# Patient Record
Sex: Female | Born: 1960 | Race: Black or African American | Hispanic: No | Marital: Single | Smoking: Former smoker
Health system: Southern US, Community
[De-identification: ages and names within clinical notes are randomized; demographics above are authoritative.]

## PROBLEM LIST (undated history)

## (undated) DIAGNOSIS — R7303 Prediabetes: Secondary | ICD-10-CM

## (undated) DIAGNOSIS — F419 Anxiety disorder, unspecified: Secondary | ICD-10-CM

## (undated) DIAGNOSIS — M549 Dorsalgia, unspecified: Secondary | ICD-10-CM

## (undated) DIAGNOSIS — I10 Essential (primary) hypertension: Secondary | ICD-10-CM

## (undated) DIAGNOSIS — E7800 Pure hypercholesterolemia, unspecified: Secondary | ICD-10-CM

## (undated) DIAGNOSIS — F32A Depression, unspecified: Secondary | ICD-10-CM

## (undated) DIAGNOSIS — K760 Fatty (change of) liver, not elsewhere classified: Secondary | ICD-10-CM

## (undated) DIAGNOSIS — K219 Gastro-esophageal reflux disease without esophagitis: Secondary | ICD-10-CM

## (undated) DIAGNOSIS — G43909 Migraine, unspecified, not intractable, without status migrainosus: Secondary | ICD-10-CM

## (undated) DIAGNOSIS — M1990 Unspecified osteoarthritis, unspecified site: Secondary | ICD-10-CM

## (undated) HISTORY — DX: Anxiety disorder, unspecified: F41.9

## (undated) HISTORY — DX: Unspecified osteoarthritis, unspecified site: M19.90

## (undated) HISTORY — PX: ABDOMINAL HYSTERECTOMY: SHX81

---

## 1998-09-15 ENCOUNTER — Emergency Department (HOSPITAL_COMMUNITY): Admission: EM | Admit: 1998-09-15 | Discharge: 1998-09-15 | Payer: Self-pay | Admitting: Emergency Medicine

## 1998-09-15 ENCOUNTER — Encounter: Payer: Self-pay | Admitting: Emergency Medicine

## 1999-08-26 ENCOUNTER — Emergency Department (HOSPITAL_COMMUNITY): Admission: EM | Admit: 1999-08-26 | Discharge: 1999-08-26 | Payer: Self-pay | Admitting: Emergency Medicine

## 1999-08-26 ENCOUNTER — Encounter: Payer: Self-pay | Admitting: Emergency Medicine

## 1999-12-15 ENCOUNTER — Ambulatory Visit (HOSPITAL_COMMUNITY): Admission: RE | Admit: 1999-12-15 | Discharge: 1999-12-15 | Payer: Self-pay | Admitting: *Deleted

## 2000-10-10 ENCOUNTER — Emergency Department (HOSPITAL_COMMUNITY): Admission: EM | Admit: 2000-10-10 | Discharge: 2000-10-10 | Payer: Self-pay | Admitting: Emergency Medicine

## 2001-11-15 ENCOUNTER — Encounter: Payer: Self-pay | Admitting: Otolaryngology

## 2001-11-15 ENCOUNTER — Encounter: Admission: RE | Admit: 2001-11-15 | Discharge: 2001-11-15 | Payer: Self-pay | Admitting: Otolaryngology

## 2001-11-25 ENCOUNTER — Encounter: Admission: RE | Admit: 2001-11-25 | Discharge: 2001-11-25 | Payer: Self-pay | Admitting: Otolaryngology

## 2001-11-25 ENCOUNTER — Encounter: Payer: Self-pay | Admitting: Otolaryngology

## 2002-10-18 ENCOUNTER — Emergency Department (HOSPITAL_COMMUNITY): Admission: EM | Admit: 2002-10-18 | Discharge: 2002-10-18 | Payer: Self-pay | Admitting: Emergency Medicine

## 2002-11-19 ENCOUNTER — Emergency Department (HOSPITAL_COMMUNITY): Admission: EM | Admit: 2002-11-19 | Discharge: 2002-11-19 | Payer: Self-pay | Admitting: Emergency Medicine

## 2003-01-26 ENCOUNTER — Other Ambulatory Visit: Admission: RE | Admit: 2003-01-26 | Discharge: 2003-01-26 | Payer: Self-pay | Admitting: Gynecology

## 2004-03-19 ENCOUNTER — Emergency Department (HOSPITAL_COMMUNITY): Admission: EM | Admit: 2004-03-19 | Discharge: 2004-03-19 | Payer: Self-pay | Admitting: Emergency Medicine

## 2004-12-07 ENCOUNTER — Encounter: Admission: RE | Admit: 2004-12-07 | Discharge: 2004-12-07 | Payer: Self-pay | Admitting: Nephrology

## 2005-05-01 ENCOUNTER — Other Ambulatory Visit: Admission: RE | Admit: 2005-05-01 | Discharge: 2005-05-01 | Payer: Self-pay | Admitting: Obstetrics and Gynecology

## 2005-05-04 ENCOUNTER — Ambulatory Visit (HOSPITAL_COMMUNITY): Admission: RE | Admit: 2005-05-04 | Discharge: 2005-05-04 | Payer: Self-pay | Admitting: Obstetrics and Gynecology

## 2005-05-15 ENCOUNTER — Encounter: Admission: RE | Admit: 2005-05-15 | Discharge: 2005-05-15 | Payer: Self-pay | Admitting: Nephrology

## 2006-01-04 ENCOUNTER — Inpatient Hospital Stay (HOSPITAL_COMMUNITY): Admission: EM | Admit: 2006-01-04 | Discharge: 2006-01-06 | Payer: Self-pay | Admitting: Emergency Medicine

## 2006-08-17 ENCOUNTER — Emergency Department (HOSPITAL_COMMUNITY): Admission: EM | Admit: 2006-08-17 | Discharge: 2006-08-18 | Payer: Self-pay | Admitting: Emergency Medicine

## 2007-01-23 ENCOUNTER — Ambulatory Visit (HOSPITAL_COMMUNITY): Admission: RE | Admit: 2007-01-23 | Discharge: 2007-01-23 | Payer: Self-pay | Admitting: Family Medicine

## 2007-01-23 ENCOUNTER — Emergency Department (HOSPITAL_COMMUNITY): Admission: EM | Admit: 2007-01-23 | Discharge: 2007-01-23 | Payer: Self-pay | Admitting: Family Medicine

## 2007-03-27 ENCOUNTER — Emergency Department (HOSPITAL_COMMUNITY): Admission: EM | Admit: 2007-03-27 | Discharge: 2007-03-27 | Payer: Self-pay | Admitting: Emergency Medicine

## 2008-04-17 ENCOUNTER — Emergency Department (HOSPITAL_COMMUNITY): Admission: EM | Admit: 2008-04-17 | Discharge: 2008-04-17 | Payer: Self-pay | Admitting: Emergency Medicine

## 2008-04-19 ENCOUNTER — Inpatient Hospital Stay (HOSPITAL_COMMUNITY): Admission: AD | Admit: 2008-04-19 | Discharge: 2008-04-19 | Payer: Self-pay | Admitting: Gynecology

## 2008-06-11 ENCOUNTER — Encounter (INDEPENDENT_AMBULATORY_CARE_PROVIDER_SITE_OTHER): Payer: Self-pay | Admitting: Obstetrics and Gynecology

## 2008-06-11 ENCOUNTER — Inpatient Hospital Stay (HOSPITAL_COMMUNITY): Admission: RE | Admit: 2008-06-11 | Discharge: 2008-06-13 | Payer: Self-pay | Admitting: Obstetrics and Gynecology

## 2008-06-19 ENCOUNTER — Inpatient Hospital Stay (HOSPITAL_COMMUNITY): Admission: AD | Admit: 2008-06-19 | Discharge: 2008-06-19 | Payer: Self-pay | Admitting: Family Medicine

## 2009-01-27 ENCOUNTER — Emergency Department (HOSPITAL_COMMUNITY): Admission: EM | Admit: 2009-01-27 | Discharge: 2009-01-27 | Payer: Self-pay | Admitting: Family Medicine

## 2009-08-07 ENCOUNTER — Emergency Department (HOSPITAL_COMMUNITY): Admission: EM | Admit: 2009-08-07 | Discharge: 2009-08-07 | Payer: Self-pay | Admitting: Emergency Medicine

## 2009-08-11 ENCOUNTER — Encounter: Admission: RE | Admit: 2009-08-11 | Discharge: 2009-08-11 | Payer: Self-pay | Admitting: Family Medicine

## 2009-11-12 ENCOUNTER — Encounter: Admission: RE | Admit: 2009-11-12 | Discharge: 2009-11-12 | Payer: Self-pay | Admitting: Cardiology

## 2009-11-15 ENCOUNTER — Ambulatory Visit (HOSPITAL_COMMUNITY): Admission: RE | Admit: 2009-11-15 | Discharge: 2009-11-15 | Payer: Self-pay | Admitting: Cardiology

## 2009-11-24 ENCOUNTER — Ambulatory Visit (HOSPITAL_COMMUNITY): Admission: RE | Admit: 2009-11-24 | Discharge: 2009-11-24 | Payer: Self-pay | Admitting: Cardiology

## 2010-09-11 ENCOUNTER — Encounter: Payer: Self-pay | Admitting: Obstetrics and Gynecology

## 2010-09-11 ENCOUNTER — Encounter: Payer: Self-pay | Admitting: Nephrology

## 2010-09-28 ENCOUNTER — Emergency Department (HOSPITAL_COMMUNITY)
Admission: EM | Admit: 2010-09-28 | Discharge: 2010-09-28 | Disposition: A | Discharge status: Home / Self Care | Payer: Self-pay | Attending: Emergency Medicine | Admitting: Emergency Medicine

## 2010-09-28 ENCOUNTER — Emergency Department (HOSPITAL_COMMUNITY): Payer: Self-pay

## 2010-09-28 DIAGNOSIS — S139XXA Sprain of joints and ligaments of unspecified parts of neck, initial encounter: Secondary | ICD-10-CM | POA: Insufficient documentation

## 2010-09-28 DIAGNOSIS — Y92410 Unspecified street and highway as the place of occurrence of the external cause: Secondary | ICD-10-CM | POA: Insufficient documentation

## 2010-09-28 DIAGNOSIS — E669 Obesity, unspecified: Secondary | ICD-10-CM | POA: Insufficient documentation

## 2010-09-28 DIAGNOSIS — R51 Headache: Secondary | ICD-10-CM | POA: Insufficient documentation

## 2010-10-27 ENCOUNTER — Emergency Department (HOSPITAL_COMMUNITY)
Admission: EM | Admit: 2010-10-27 | Discharge: 2010-10-27 | Disposition: A | Discharge status: Home / Self Care | Payer: Self-pay | Attending: Emergency Medicine | Admitting: Emergency Medicine

## 2010-10-27 DIAGNOSIS — E7800 Pure hypercholesterolemia, unspecified: Secondary | ICD-10-CM | POA: Insufficient documentation

## 2010-10-27 DIAGNOSIS — N644 Mastodynia: Secondary | ICD-10-CM | POA: Insufficient documentation

## 2010-10-27 DIAGNOSIS — K089 Disorder of teeth and supporting structures, unspecified: Secondary | ICD-10-CM | POA: Insufficient documentation

## 2010-10-27 DIAGNOSIS — S2000XA Contusion of breast, unspecified breast, initial encounter: Secondary | ICD-10-CM | POA: Insufficient documentation

## 2010-10-27 DIAGNOSIS — I10 Essential (primary) hypertension: Secondary | ICD-10-CM | POA: Insufficient documentation

## 2010-10-27 DIAGNOSIS — X58XXXA Exposure to other specified factors, initial encounter: Secondary | ICD-10-CM | POA: Insufficient documentation

## 2010-10-27 DIAGNOSIS — R6884 Jaw pain: Secondary | ICD-10-CM | POA: Insufficient documentation

## 2010-11-09 LAB — PROTIME-INR: INR: 1.07 (ref 0.00–1.49)

## 2010-11-09 LAB — APTT: aPTT: 30 seconds (ref 24–37)

## 2010-11-09 LAB — BASIC METABOLIC PANEL
BUN: 19 mg/dL (ref 6–23)
CO2: 30 mEq/L (ref 19–32)
Calcium: 8.9 mg/dL (ref 8.4–10.5)
Creatinine, Ser: 0.87 mg/dL (ref 0.4–1.2)
Glucose, Bld: 116 mg/dL — ABNORMAL HIGH (ref 70–99)
Sodium: 138 mEq/L (ref 135–145)

## 2010-11-09 LAB — CBC
HCT: 39.1 % (ref 36.0–46.0)
Hemoglobin: 13.1 g/dL (ref 12.0–15.0)
RBC: 4.56 MIL/uL (ref 3.87–5.11)
RDW: 14 % (ref 11.5–15.5)

## 2010-12-27 ENCOUNTER — Emergency Department (HOSPITAL_COMMUNITY): Payer: Self-pay

## 2010-12-27 ENCOUNTER — Inpatient Hospital Stay (HOSPITAL_COMMUNITY)
Admission: AD | Admit: 2010-12-27 | Discharge: 2010-12-27 | Disposition: A | Discharge status: Home / Self Care | Payer: Self-pay | Source: Ambulatory Visit | Attending: Obstetrics & Gynecology | Admitting: Obstetrics & Gynecology

## 2010-12-27 ENCOUNTER — Emergency Department (HOSPITAL_COMMUNITY)
Admission: EM | Admit: 2010-12-27 | Discharge: 2010-12-28 | Disposition: A | Discharge status: Home / Self Care | Payer: Self-pay | Attending: Emergency Medicine | Admitting: Emergency Medicine

## 2010-12-27 DIAGNOSIS — R109 Unspecified abdominal pain: Secondary | ICD-10-CM | POA: Insufficient documentation

## 2010-12-27 DIAGNOSIS — Z90710 Acquired absence of both cervix and uterus: Secondary | ICD-10-CM | POA: Insufficient documentation

## 2010-12-27 DIAGNOSIS — R110 Nausea: Secondary | ICD-10-CM | POA: Insufficient documentation

## 2010-12-27 DIAGNOSIS — R197 Diarrhea, unspecified: Secondary | ICD-10-CM | POA: Insufficient documentation

## 2010-12-27 DIAGNOSIS — R1031 Right lower quadrant pain: Secondary | ICD-10-CM | POA: Insufficient documentation

## 2010-12-27 LAB — COMPREHENSIVE METABOLIC PANEL
ALT: 22 U/L (ref 0–35)
ALT: 26 U/L (ref 0–35)
AST: 23 U/L (ref 0–37)
Albumin: 3.1 g/dL — ABNORMAL LOW (ref 3.5–5.2)
Alkaline Phosphatase: 91 U/L (ref 39–117)
BUN: 13 mg/dL (ref 6–23)
CO2: 28 mEq/L (ref 19–32)
Calcium: 8.6 mg/dL (ref 8.4–10.5)
Chloride: 102 mEq/L (ref 96–112)
GFR calc Af Amer: >60 mL/min (ref >60)
GFR calc non Af Amer: >60 mL/min (ref >60)
Glucose, Bld: 93 mg/dL (ref 70–99)
Glucose, Bld: 92 mg/dL (ref 70–99)
Potassium: 3.5 mEq/L (ref 3.5–5.1)
Sodium: 139 mEq/L (ref 135–145)
Sodium: 139 mEq/L (ref 135–145)
Total Bilirubin: 0.4 mg/dL (ref 0.3–1.2)
Total Protein: 6.5 g/dL (ref 6.0–8.3)

## 2010-12-27 LAB — DIFFERENTIAL
Basophils Absolute: 0 10*3/uL (ref 0.0–0.1)
Eosinophils Absolute: 0.3 10*3/uL (ref 0.0–0.7)
Eosinophils Relative: 4 % (ref 0–5)
Lymphocytes Relative: 38 % (ref 12–46)
Monocytes Absolute: 0.4 10*3/uL (ref 0.1–1.0)

## 2010-12-27 LAB — URINALYSIS, ROUTINE W REFLEX MICROSCOPIC
Nitrite: NEGATIVE
Specific Gravity, Urine: 1.015 (ref 1.005–1.030)
Urobilinogen, UA: 0.2 mg/dL (ref 0.0–1.0)
pH: 6.5 (ref 5.0–8.0)

## 2010-12-27 LAB — LIPASE, BLOOD: Lipase: 20 U/L (ref 11–59)

## 2010-12-27 LAB — CBC
HCT: 39.4 % (ref 36.0–46.0)
MCHC: 32.5 g/dL (ref 30.0–36.0)
Platelets: 229 10*3/uL (ref 150–400)
RDW: 13.9 % (ref 11.5–15.5)
WBC: 6.3 10*3/uL (ref 4.0–10.5)

## 2010-12-27 LAB — URINE MICROSCOPIC-ADD ON

## 2010-12-28 ENCOUNTER — Encounter (HOSPITAL_COMMUNITY): Payer: Self-pay

## 2010-12-28 MED ORDER — IOHEXOL 300 MG/ML  SOLN
100.0000 mL | Freq: Once | INTRAMUSCULAR | Status: AC | PRN
  Administered 2010-12-28: 100 mL via INTRAVENOUS

## 2011-01-02 ENCOUNTER — Other Ambulatory Visit (HOSPITAL_COMMUNITY): Payer: Self-pay | Admitting: Internal Medicine

## 2011-01-02 DIAGNOSIS — R1011 Right upper quadrant pain: Secondary | ICD-10-CM

## 2011-01-03 NOTE — H&P (Signed)
[name], Kelsey Santana NO.:  [id number]   MEDICAL RECORD NO.:  [id number]          PATIENT TYPE:  AMB   LOCATION:  SDC                           FACILITY:  [facility]   PHYSICIAN:  Lenoard Aden, M.D.DATE OF BIRTH:  Sep 12, 1960   DATE OF ADMISSION:  DATE OF DISCHARGE:                              HISTORY & PHYSICAL   CHIEF COMPLAINT:  Pelvic pain, bleeding, and symptomatic fibroids.   HISTORY OF PRESENT ILLNESS:  A 50 year old African American female G6,  P3, history of vaginal delivery x3, tubal ligation in 1987 for  symptomatic fibroids who desires minimally invasive surgery.   ALLERGIES:  She has allergies to CODEINE.   MEDICATIONS:  Wellbutrin and Percocet.   SOCIAL HISTORY:  She is a nonsmoker and nondrinker.  She denies domestic  or physical violence.   PAST MEDICAL HISTORY:  She has a history of 3 vaginal deliveries and 3  miscarriage, and tubal ligation in 25.   PHYSICAL EXAMINATION:  GENERAL:  She is a well-developed, well-nourished  African-American female in no acute distress.  HEENT:  Normal.  LUNGS:  Clear.  HEART:  Regular rate and rhythm.  ABDOMEN:  Soft and nontender.  PELVIC:  Uterus to be 10-12 weeks size and mobile.  No adnexal masses.  EXTREMITIES:  No cords.  NEUROLOGIC:  Nonfocal.  SKIN:  Intact.   IMPRESSION:  Symptomatic fibroids with a history of persistent pelvic  pain and desires definitive therapy.   PLAN:  Proceed with TLH, possible TAH-BSO.  Risks of anesthesia,  infection, bleeding, injury to abdominal organs and need for repair is  discussed, delayed versus immediate complications including bowel and  bladder injury noted. The patient acknowledges and wishes to proceed.      Lenoard Aden, M.D.  Electronically Signed     RJT/MEDQ  D:  06/10/2008  T:  06/11/2008  Job:  [id number]

## 2011-01-03 NOTE — Op Note (Signed)
[name], ROADS NO.:  [id number]   MEDICAL RECORD NO.:  [id number]          PATIENT TYPE:  INP   LOCATION:  9316                          FACILITY:  [facility]   PHYSICIAN:  Kelsey Santana, M.D.DATE OF BIRTH:  1961-03-10   DATE OF PROCEDURE:  06/11/2008  DATE OF DISCHARGE:                               OPERATIVE REPORT   PREOPERATIVE DIAGNOSIS:  Symptomatic uterine fibroids.   POSTOPERATIVE DIAGNOSIS:  Symptomatic uterine fibroids.   PROCEDURE:  Diagnostic laparoscopy, total abdominal hysterectomy,  bilateral salpingo-oophorectomy.   SURGEON:  Kelsey Aden, MD   ASSISTANT:  ___fogleman_______   ANESTHESIA:  General.   ESTIMATED BLOOD LOSS:  250 mL.   COMPLICATIONS:  None.   DRAINS:  Foley.   COUNTS:  Correct.   SPECIMEN:  Uterus, cervix, bilateral tubes and ovaries to pathology.   BRIEF OPERATIVE NOTE:  Being apprised of risks of anesthesia, infection,  bleeding, injury to abdominal organs, need for repair, delayed versus  immediate complications to include bowel and bladder injury, the patient  was brought to the operating where she was administered a general  anesthetic without complications, prepped and draped in usual sterile  fashion.  Feet are placed in the Yellofin stirrups.  The Rumi retractor  is placed without difficulty per vagina.  Infraumbilical incision made  with scalpel.  Fascia identified, opened, pursestring placed using 0  Vicryl suture.  Peritoneal entry made.  Hassan trocar placed.  Visualization reveals leakage of preperitoneal air and an inability to  visualize anterior and posterior cul-de-sac.  The patient is unable to  tolerate deep Trendelenburg due to her morbid obesity thereby due to  limited visualization and inability to position the patient properly.  Decision was made to proceed with an abdominal procedure.  The  infraumbilical incision was closed by tying the pursestring of 0 Vicryl  and a 4-0 Vicryl on the  skin.  Good hemostasis noted.  Attention is  turned to the abdominal portion of procedure whereby the patient was  repositioned on the table.  Feet were removed and placed on the table in  standard position out of the Yellofin stirrups.  All instruments removed  from the vagina.  At this time, a Pfannenstiel skin incision made with  the scalpel, carried down to the fascia which nicked in the midline and  opened with Mayo scissors.  Rectus muscles dissected sharply in the  midline.  Peritoneum entered sharply.  Bladder blade placed.  Visceral  peritoneum scored sharply off the lower uterine segment.  The round  ligaments were grasped bilaterally and ligated.  Retroperitoneal entry  was made.  The infundibulopelvic ligaments are bilaterally ligated using  LigaSure on the left and 0 Vicryl tie and interrupted suture on the  right LigaSure on the left.  After this, the uterine vessels were  skeletonized bilaterally and bladder flap was developed sharply and the  uterine vessels are ligated and divided using LigaSure and scissors.  The cardinal ligaments were then divided using LigaSure.  Having placed  a Balfour retractor and four packs, at this time the uterosacral  ligaments were bilaterally clamped and  suture ligated and held.  Vaginal  entry was made using curved Heaney clamps.  The specimen was removed.  Vagina closed front to back sutures of 0 Vicryl interrupted sutures and  good hemostasis noted.  Irrigation was accomplished.  Bladder flap was  inspected, found to be hemostatic.  All packs and retractor are then  removed.  Good hemostasis was achieved.  Fascia was closed using a 0 PDS  in a running fashion.  Subcutaneous tissue reapproximated using a 0  plain, skin closed using staples.  The patient tolerated the procedure  well and was transferred to recovery in good condition.      Kelsey Santana, M.D.  Electronically Signed     RJT/MEDQ  D:  06/11/2008  T:  06/12/2008   Job:  [id number]

## 2011-01-05 ENCOUNTER — Other Ambulatory Visit (HOSPITAL_COMMUNITY): Payer: Self-pay

## 2011-01-06 NOTE — Discharge Summary (Signed)
NAMEPRABHLEEN, MONTEMAYOR NO.:  [id number]   MEDICAL RECORD NO.:  [id number]          PATIENT TYPE:  INP   LOCATION:  9316                          [facility]ACILITY:  [facility]   PHYSICIAN:  Lenoard Aden, M.D.DATE OF BIRTH:  1960/09/04   DATE OF ADMISSION:  06/11/2008  DATE OF DISCHARGE:  06/13/2008                               DISCHARGE SUMMARY   The patient underwent uncomplicated diagnostic laparoscopy, total  abdominal hysterectomy June 11, 2008.   Postoperative course uncomplicated.   Discharged to home on postop day #3.  Discharge teaching done.  Discharge medications discussed.   DISCHARGE MEDICATIONS:  1. Tylox.  2. Multivitamin.  3. Hormone replacement therapy.   She is to follow up in the office for incisional care.  Incisional care  is reviewed.  She is going to follow up in the office in 1 week.   DISCHARGE STATUS:  Good.      Lenoard Aden, M.D.  Electronically Signed     RJT/MEDQ  D:  07/14/2008  T:  07/14/2008  Job:  [id number]

## 2011-05-17 ENCOUNTER — Emergency Department (HOSPITAL_COMMUNITY)
Admission: EM | Admit: 2011-05-17 | Discharge: 2011-05-17 | Discharge status: Against Medical Advice | Payer: Self-pay | Attending: Emergency Medicine | Admitting: Emergency Medicine

## 2011-05-17 ENCOUNTER — Inpatient Hospital Stay (INDEPENDENT_AMBULATORY_CARE_PROVIDER_SITE_OTHER)
Admission: RE | Admit: 2011-05-17 | Discharge: 2011-05-17 | Disposition: A | Discharge status: Home / Self Care | Payer: No Typology Code available for payment source | Source: Ambulatory Visit | Attending: Emergency Medicine | Admitting: Emergency Medicine

## 2011-05-17 ENCOUNTER — Emergency Department (HOSPITAL_COMMUNITY): Admission: EM | Admit: 2011-05-17 | Payer: Self-pay | Source: Home / Self Care

## 2011-05-17 DIAGNOSIS — Z0389 Encounter for observation for other suspected diseases and conditions ruled out: Secondary | ICD-10-CM | POA: Insufficient documentation

## 2011-05-17 DIAGNOSIS — R109 Unspecified abdominal pain: Secondary | ICD-10-CM

## 2011-05-17 LAB — URINALYSIS, ROUTINE W REFLEX MICROSCOPIC
Bilirubin Urine: NEGATIVE
Ketones, ur: NEGATIVE mg/dL
Nitrite: NEGATIVE
Protein, ur: NEGATIVE mg/dL
Urobilinogen, UA: 0.2 mg/dL (ref 0.0–1.0)

## 2011-05-17 LAB — URINE MICROSCOPIC-ADD ON

## 2011-05-18 ENCOUNTER — Other Ambulatory Visit: Payer: Self-pay | Admitting: Internal Medicine

## 2011-05-18 ENCOUNTER — Ambulatory Visit
Admission: RE | Admit: 2011-05-18 | Discharge: 2011-05-18 | Disposition: A | Discharge status: Home / Self Care | Payer: No Typology Code available for payment source | Source: Ambulatory Visit | Attending: Internal Medicine | Admitting: Internal Medicine

## 2011-05-18 DIAGNOSIS — R1011 Right upper quadrant pain: Secondary | ICD-10-CM

## 2011-05-23 LAB — CBC
HCT: 33.8 — ABNORMAL LOW
HCT: 39.5
Hemoglobin: 11.5 — ABNORMAL LOW
Hemoglobin: 13.3
MCHC: 33.4
MCHC: 33.9
MCV: 87.3
Platelets: 271
RBC: 3.75 — ABNORMAL LOW
RBC: 3.87
RBC: 4.56
RDW: 13.1
WBC: 5.8

## 2011-06-08 LAB — POCT URINALYSIS DIP (DEVICE)
Bilirubin Urine: NEGATIVE
Glucose, UA: NEGATIVE
Ketones, ur: NEGATIVE
Nitrite: NEGATIVE
Operator id: 247071
Protein, ur: NEGATIVE
Specific Gravity, Urine: 1.015
Urobilinogen, UA: 0.2
pH: 6

## 2011-06-08 LAB — DIFFERENTIAL
Basophils Relative: 1
Eosinophils Absolute: 0.3
Lymphs Abs: 2
Neutrophils Relative %: 67

## 2011-06-08 LAB — WET PREP, GENITAL
Clue Cells Wet Prep HPF POC: NONE SEEN
Yeast Wet Prep HPF POC: NONE SEEN

## 2011-06-08 LAB — CBC
MCHC: 33.3
MCV: 86.9
Platelets: 313
WBC: 8.3

## 2011-06-08 LAB — POCT PREGNANCY, URINE
Operator id: 247071
Preg Test, Ur: NEGATIVE

## 2011-07-06 ENCOUNTER — Encounter (HOSPITAL_COMMUNITY): Payer: Self-pay | Admitting: *Deleted

## 2011-07-06 ENCOUNTER — Other Ambulatory Visit: Payer: Self-pay

## 2011-07-06 ENCOUNTER — Emergency Department (HOSPITAL_COMMUNITY): Payer: No Typology Code available for payment source

## 2011-07-06 ENCOUNTER — Emergency Department (HOSPITAL_COMMUNITY)
Admission: EM | Admit: 2011-07-06 | Discharge: 2011-07-06 | Disposition: A | Discharge status: Home / Self Care | Payer: No Typology Code available for payment source | Attending: Emergency Medicine | Admitting: Emergency Medicine

## 2011-07-06 DIAGNOSIS — S20219A Contusion of unspecified front wall of thorax, initial encounter: Secondary | ICD-10-CM | POA: Insufficient documentation

## 2011-07-06 DIAGNOSIS — M542 Cervicalgia: Secondary | ICD-10-CM | POA: Insufficient documentation

## 2011-07-06 DIAGNOSIS — S161XXA Strain of muscle, fascia and tendon at neck level, initial encounter: Secondary | ICD-10-CM

## 2011-07-06 DIAGNOSIS — S0990XA Unspecified injury of head, initial encounter: Secondary | ICD-10-CM | POA: Insufficient documentation

## 2011-07-06 DIAGNOSIS — M545 Low back pain, unspecified: Secondary | ICD-10-CM | POA: Insufficient documentation

## 2011-07-06 DIAGNOSIS — R10819 Abdominal tenderness, unspecified site: Secondary | ICD-10-CM | POA: Insufficient documentation

## 2011-07-06 DIAGNOSIS — S139XXA Sprain of joints and ligaments of unspecified parts of neck, initial encounter: Secondary | ICD-10-CM | POA: Insufficient documentation

## 2011-07-06 DIAGNOSIS — R079 Chest pain, unspecified: Secondary | ICD-10-CM | POA: Insufficient documentation

## 2011-07-06 DIAGNOSIS — R51 Headache: Secondary | ICD-10-CM | POA: Insufficient documentation

## 2011-07-06 DIAGNOSIS — R0602 Shortness of breath: Secondary | ICD-10-CM | POA: Insufficient documentation

## 2011-07-06 DIAGNOSIS — R109 Unspecified abdominal pain: Secondary | ICD-10-CM | POA: Insufficient documentation

## 2011-07-06 HISTORY — DX: Pure hypercholesterolemia, unspecified: E78.00

## 2011-07-06 HISTORY — DX: Essential (primary) hypertension: I10

## 2011-07-06 LAB — POCT I-STAT, CHEM 8
BUN: 14 mg/dL (ref 6–23)
Calcium, Ion: 1.13 mmol/L (ref 1.12–1.32)
Chloride: 103 mEq/L (ref 96–112)
Glucose, Bld: 102 mg/dL — ABNORMAL HIGH (ref 70–99)
TCO2: 27 mmol/L (ref 0–100)

## 2011-07-06 MED ORDER — DIAZEPAM 5 MG PO TABS
5.0000 mg | ORAL_TABLET | Freq: Once | ORAL | Status: AC
  Administered 2011-07-06: 5 mg via ORAL
  Filled 2011-07-06: qty 1

## 2011-07-06 MED ORDER — OXYCODONE-ACETAMINOPHEN 5-325 MG PO TABS: 2.0000 | ORAL_TABLET | ORAL | Status: AC | PRN

## 2011-07-06 MED ORDER — MORPHINE SULFATE 4 MG/ML IJ SOLN
6.0000 mg | Freq: Once | INTRAMUSCULAR | Status: AC
  Administered 2011-07-06: 6 mg via INTRAVENOUS
  Filled 2011-07-06: qty 2

## 2011-07-06 MED ORDER — IBUPROFEN 800 MG PO TABS: 800.0000 mg | ORAL_TABLET | Freq: Three times a day (TID) | ORAL | Status: AC

## 2011-07-06 MED ORDER — IOHEXOL 300 MG/ML  SOLN
100.0000 mL | Freq: Once | INTRAMUSCULAR | Status: AC | PRN
  Administered 2011-07-06: 100 mL via INTRAVENOUS

## 2011-07-06 MED ORDER — ONDANSETRON HCL 4 MG/2ML IJ SOLN
4.0000 mg | Freq: Once | INTRAMUSCULAR | Status: AC
  Administered 2011-07-06: 4 mg via INTRAVENOUS
  Filled 2011-07-06: qty 2

## 2011-07-06 MED ORDER — DIAZEPAM 5 MG PO TABS: 5.0000 mg | ORAL_TABLET | Freq: Three times a day (TID) | ORAL | Status: AC | PRN

## 2011-07-06 NOTE — ED Provider Notes (Signed)
Medical screening examination/treatment/procedure(s) were performed by non-physician practitioner and as supervising physician I was immediately available for consultation/collaboration.   Jedd Schulenburg R. Itzamara Casas, MD 07/06/11 2341 

## 2011-07-06 NOTE — ED Notes (Signed)
Prior to arrival [facility] reported vitals as BP 178/120, RR 18, Pulse 90, Pt arrived in C-collar on back board

## 2011-07-06 NOTE — ED Notes (Signed)
Pt resting quietly in bed. States pain in chest/abdonmen is improved. Reports headache at this time. Awaiting ct results.
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
## 2012-03-01 ENCOUNTER — Telehealth: Payer: Self-pay | Admitting: *Deleted

## 2012-03-01 NOTE — Telephone Encounter (Signed)
Pt returning call and would like to be scheduled in first available and it does not matter if the provider is female is female .Marland KitchenLoralee Pacas [facility]

## 2012-03-11 ENCOUNTER — Ambulatory Visit (INDEPENDENT_AMBULATORY_CARE_PROVIDER_SITE_OTHER): Payer: Self-pay | Admitting: Family Medicine

## 2012-03-11 ENCOUNTER — Encounter: Payer: Self-pay | Admitting: Family Medicine

## 2012-03-11 VITALS — BP 123/79 | HR 57 | Temp 98.1°F | Ht 63.5 in | Wt 280.2 lb

## 2012-03-11 DIAGNOSIS — G43909 Migraine, unspecified, not intractable, without status migrainosus: Secondary | ICD-10-CM

## 2012-03-11 DIAGNOSIS — K219 Gastro-esophageal reflux disease without esophagitis: Secondary | ICD-10-CM

## 2012-03-11 DIAGNOSIS — E785 Hyperlipidemia, unspecified: Secondary | ICD-10-CM

## 2012-03-11 DIAGNOSIS — I10 Essential (primary) hypertension: Secondary | ICD-10-CM

## 2012-03-11 MED ORDER — PROPRANOLOL HCL 20 MG PO TABS: 20.0000 mg | ORAL_TABLET | Freq: Two times a day (BID) | ORAL | Status: DC

## 2012-03-11 NOTE — Progress Notes (Signed)
  Subjective:    Patient ID: Kelsey Santana, female    DOB: 25-Jun-1961, 51 y.o.   MRN: [id number]  HPI Patient is a 51 yo female who presents for a new patient visit with her main complaints regarding her migraine headaches and epigastric pain.    1. Migraine headaches: patient has a long history of these.  States when she gets a migraine, she has nausea, vomiting, extreme light intolerance, and feels as though her equilibrium is off.  Has previously been treated with depakote, topamax, imetrex, trigger point injections, prednisone, wellbutrin, and demoral.  Out of these, she feels as though only the trigger point injections, wellbutrin, and demerol have been effective.  With the injections, she said they were very painful and didn't work for long enough to cover the 3 month gap between injections.  She states the wellbutrin was effective because it helped decrease her stress level and she believes stress is a trigger for her migraines.  The demerol is effective at breaking the migraines once they have started.  Currently she is having a couple of migraines per week and is just suffering through them as she does not have insurance anymore and is unable to afford the medication at this time.  2. Epigastric pain: states she has a burning pain in her epigastric region.  Occasionally this moves up her chest. patient has a history of GERD and has previously taken a number of PPIs for this.  She states the only medication that has worked was protonix, but she cannot afford this at this time.  3. HTN: Currently well controlled. 123/79 in clinic today. Patient states she takes benazepril-HCTZ.    Review of Systems negative except per HPI     Objective:   Physical Exam  Constitutional: She is oriented to person, place, and time. She appears well-developed and well-nourished.  HENT:  Head: Normocephalic and atraumatic.  Eyes: Pupils are equal, round, and reactive to light.  Cardiovascular: Normal rate,  regular rhythm and normal heart sounds.   Pulmonary/Chest: Effort normal and breath sounds normal.  Abdominal: Soft. Bowel sounds are normal. She exhibits no mass. There is tenderness (epigastric region with palpation). There is no rebound and no guarding.  Neurological: She is alert and oriented to person, place, and time. She has normal strength and normal reflexes. No cranial nerve deficit.  Psychiatric: She has a normal mood and affect.          Assessment & Plan:

## 2012-03-11 NOTE — Assessment & Plan Note (Signed)
Patient is to start over the counter omeprazole for her epigastric and reflux symptoms. Once approved for the [facility] card will give prescription for omeprazole for patient to get from the health department.

## 2012-03-11 NOTE — Assessment & Plan Note (Signed)
Patient has tried numerous medications for this with no benefit.  Only medications to have been effective were welbutrin and demerol, which are now too expensive for the patient now that she has no insurance.  Will start the patient on Propranolol 20 mg BID for migraine prophylaxis.  Patient advised to try tylenol or aspirin 650 mg for breaking the migraines once the pain started.  She understood that she would not be prescribed any form of narcotics at this visit.

## 2012-03-11 NOTE — Assessment & Plan Note (Addendum)
Currently treated with atorvastatin. Patient will return in one month for a full physical exam and will obtain labs at that appointment to determine lipid levels.

## 2012-03-11 NOTE — Assessment & Plan Note (Signed)
Blood pressure well controlled currently on Benazeprill-HCTZ.  Will continue to follow this.

## 2012-03-11 NOTE — Patient Instructions (Addendum)
Migraine Headache A migraine headache is an intense, throbbing pain on one or both sides of your head. The exact cause of a migraine headache is not always known. A migraine may be caused when nerves in the brain become irritated and release chemicals that cause swelling within blood vessels, causing pain. Many migraine sufferers have a family history of migraines. Before you get a migraine you may or may not get an aura. An aura is a group of symptoms that can predict the beginning of a migraine. An aura may include:  Visual changes such as:   Flashing lights.   Bright spots or zig-zag lines.   Tunnel vision.   Feelings of numbness.   Trouble talking.   Muscle weakness.  SYMPTOMS  Pain on one or both sides of your head.   Pain that is pulsating or throbbing in nature.   Pain that is severe enough to prevent daily activities.   Pain that is aggravated by any daily physical activity.   Nausea (feeling sick to your stomach), vomiting, or both.   Pain with exposure to bright lights, loud noises, or activity.   General sensitivity to bright lights or loud noises.  MIGRAINE TRIGGERS Examples of triggers of migraine headaches include:   Alcohol.   Smoking.   Stress.   It may be related to menses (female menstruation).   Aged cheeses.   Foods or drinks that contain nitrates, glutamate, aspartame, or tyramine.   Lack of sleep.   Chocolate.   Caffeine.   Hunger.   Medications such as nitroglycerine (used to treat chest pain), birth control pills, estrogen, and some blood pressure medications.  DIAGNOSIS  A migraine headache is often diagnosed based on:  Symptoms.   Physical examination.   A computerized X-ray scan (computed tomography, CT) of your head.  TREATMENT  Medications can help prevent migraines if they are recurrent or should they become recurrent. Your caregiver can help you with a medication or treatment program that will be helpful to you.   Lying  down in a dark, quiet room may be helpful.   Keeping a headache diary may help you find a trend as to what may be triggering your headaches.  SEEK IMMEDIATE MEDICAL CARE IF:   You have confusion, personality changes or seizures.   You have headaches that wake you from sleep.   You have an increased frequency in your headaches.   You have a stiff neck.   You have a loss of vision.   You have muscle weakness.   You start losing your balance or have trouble walking.   You feel faint or pass out.  MAKE SURE YOU:   Understand these instructions.   Will watch your condition.   Will get help right away if you are not doing well or get worse.  Document Released: 08/07/2005 Document Revised: 07/27/2011 Document Reviewed: 03/23/2009 [facility] Patient Information 2012 [place], [place].  Gastroesophageal Reflux Disease, Adult Gastroesophageal reflux disease (GERD) happens when acid from your stomach flows up into the esophagus. When acid comes in contact with the esophagus, the acid causes soreness (inflammation) in the esophagus. Over time, GERD may create small holes (ulcers) in the lining of the esophagus. CAUSES   Increased body weight. This puts pressure on the stomach, making acid rise from the stomach into the esophagus.   Smoking. This increases acid production in the stomach.   Drinking alcohol. This causes decreased pressure in the lower esophageal sphincter (valve or ring of muscle between  the esophagus and stomach), allowing acid from the stomach into the esophagus.   Late evening meals and a full stomach. This increases pressure and acid production in the stomach.   A malformed lower esophageal sphincter.  Sometimes, no cause is found. SYMPTOMS   Burning pain in the lower part of the mid-chest behind the breastbone and in the mid-stomach area. This may occur twice a week or more often.   Trouble swallowing.   Sore throat.   Dry cough.   Asthma-like symptoms  including chest tightness, shortness of breath, or wheezing.  DIAGNOSIS  Your caregiver may be able to diagnose GERD based on your symptoms. In some cases, X-rays and other tests may be done to check for complications or to check the condition of your stomach and esophagus. TREATMENT  Your caregiver may recommend over-the-counter or prescription medicines to help decrease acid production. Ask your caregiver before starting or adding any new medicines.  HOME CARE INSTRUCTIONS   Change the factors that you can control. Ask your caregiver for guidance concerning weight loss, quitting smoking, and alcohol consumption.   Avoid foods and drinks that make your symptoms worse, such as:   Caffeine or alcoholic drinks.   Chocolate.   Peppermint or mint flavorings.   Garlic and onions.   Spicy foods.   Citrus fruits, such as oranges, lemons, or limes.   Tomato-based foods such as sauce, chili, salsa, and pizza.   Fried and fatty foods.   Avoid lying down for the 3 hours prior to your bedtime or prior to taking a nap.   Eat small, frequent meals instead of large meals.   Wear loose-fitting clothing. Do not wear anything tight around your waist that causes pressure on your stomach.   Raise the head of your bed 6 to 8 inches with wood blocks to help you sleep. Extra pillows will not help.   Only take over-the-counter or prescription medicines for pain, discomfort, or fever as directed by your caregiver.   Do not take aspirin, ibuprofen, or other nonsteroidal anti-inflammatory drugs (NSAIDs).  SEEK IMMEDIATE MEDICAL CARE IF:   You have pain in your arms, neck, jaw, teeth, or back.   Your pain increases or changes in intensity or duration.   You develop nausea, vomiting, or sweating (diaphoresis).   You develop shortness of breath, or you faint.   Your vomit is green, yellow, black, or looks like coffee grounds or blood.   Your stool is red, bloody, or black.  These symptoms could  be signs of other problems, such as heart disease, gastric bleeding, or esophageal bleeding. MAKE SURE YOU:   Understand these instructions.   Will watch your condition.   Will get help right away if you are not doing well or get worse.  Document Released: 05/17/2005 Document Revised: 07/27/2011 Document Reviewed: 02/24/2011 [facility] Patient Information 2012 [place], [place].     Nice to meet you today. We discussed your migraine headaches and stomach pain. For your migraines, I have prescribed Propranolol 20 mg two times per day.  Please also try Aspirin 650 mg or tylenol for your headaches. For your epigastric pain, please try over the counter omeprazole for the next month.  If this does not help we will reevaluate when you return for your physical exam. Please return for a physical exam in one month. Thank you for your visit today.

## 2012-03-19 ENCOUNTER — Encounter: Payer: Self-pay | Admitting: Family Medicine

## 2012-03-19 ENCOUNTER — Ambulatory Visit (INDEPENDENT_AMBULATORY_CARE_PROVIDER_SITE_OTHER): Payer: Self-pay | Admitting: Family Medicine

## 2012-03-19 VITALS — BP 158/80 | HR 68 | Temp 98.2°F | Ht 64.0 in | Wt 277.0 lb

## 2012-03-19 DIAGNOSIS — G43909 Migraine, unspecified, not intractable, without status migrainosus: Secondary | ICD-10-CM

## 2012-03-19 DIAGNOSIS — I10 Essential (primary) hypertension: Secondary | ICD-10-CM

## 2012-03-19 DIAGNOSIS — R1013 Epigastric pain: Secondary | ICD-10-CM | POA: Insufficient documentation

## 2012-03-19 LAB — CBC
HCT: 38.9 % (ref 36.0–46.0)
Hemoglobin: 13.2 g/dL (ref 12.0–15.0)
MCHC: 33.9 g/dL (ref 30.0–36.0)
RBC: 4.63 MIL/uL (ref 3.87–5.11)
WBC: 4.8 10*3/uL (ref 4.0–10.5)

## 2012-03-19 LAB — COMPREHENSIVE METABOLIC PANEL
Albumin: 4 g/dL (ref 3.5–5.2)
BUN: 10 mg/dL (ref 6–23)
CO2: 28 mEq/L (ref 19–32)
Calcium: 8.7 mg/dL (ref 8.4–10.5)
Chloride: 107 mEq/L (ref 96–112)
Creat: 0.78 mg/dL (ref 0.50–1.10)
Glucose, Bld: 109 mg/dL — ABNORMAL HIGH (ref 70–99)
Potassium: 4.2 mEq/L (ref 3.5–5.3)

## 2012-03-19 LAB — POCT H PYLORI SCREEN: H Pylori Screen, POC: NEGATIVE

## 2012-03-19 MED ORDER — PROPRANOLOL HCL 40 MG PO TABS: 40.0000 mg | ORAL_TABLET | Freq: Two times a day (BID) | ORAL | Status: DC

## 2012-03-19 MED ORDER — KETOROLAC TROMETHAMINE 30 MG/ML IJ SOLN
30.0000 mg | Freq: Once | INTRAMUSCULAR | Status: AC
  Administered 2012-03-19: 30 mg via INTRAMUSCULAR

## 2012-03-19 MED ORDER — PROMETHAZINE HCL 25 MG/ML IJ SOLN
25.0000 mg | Freq: Once | INTRAMUSCULAR | Status: AC
  Administered 2012-03-19: 25 mg via INTRAMUSCULAR

## 2012-03-19 MED ORDER — DEXAMETHASONE SODIUM PHOSPHATE 10 MG/ML IJ SOLN
10.0000 mg | Freq: Once | INTRAMUSCULAR | Status: AC
  Administered 2012-03-19: 10 mg via INTRAMUSCULAR

## 2012-03-19 NOTE — Assessment & Plan Note (Signed)
POorly controlled.  Will be increasing propranolol for migraines.  Follow-up with PCP in 2-3 weeks

## 2012-03-19 NOTE — Progress Notes (Signed)
Subjective:     Patient ID: Kelsey Santana, female   DOB: 1961-03-14, 51 y.o.   MRN: [id number] Discussed and examined patient with [facility]- I have edited as below: Kelsey Santana, M.D.   HPI Ms. Faulks is a 51 y/o woman with a history of hypertension, hyperlipidemia, GERD, and migraines who comes to clinic today with headache and abdominal pain.   She says the headache started about two weeks ago. She says she has had migraines in the past and this headache feels very similar. The headaches usually start on the left side, but then her entire head begins to hurt. She feels that bright lights and loud noises make the headaches worse. She reports that occasional floaters. She has tried taking tylenol and excedrin to help, but has had little relief. She is taking the propranolol prescribed to her at last visit but feels this is not helping either.   She reports the abdominal pain has been going on longer than the headaches, for about a months time. At her last visit on 7/22 it was felt that her symptoms were related to her GERD, and she started taking her Nexium again. She feels that this has not helped her symptoms. She states the pain is worst in the epigastric region, but also feels it on the upper left and right side. The pain feels like a "sharp, stabbing pain" and is made worse by eating and drinking. She also has experienced the feeling of "phlegm" coming up her throat often.   She denies other symptoms, including fever, vomiting, diarrhea, blood in her urine or stool, shortness of breath, chest pain, or numbness or tingling in her hands or feet.   Surgical Hx: Reports she has had a complete hysterectomy and bilateral oopherectomy  Review of Systems As per above.   Objective:   Physical Exam General: Well appearing, no acute distress HEENT: PERRLA, neck is supple and without adenopathy Pulm: LCTAB CV: RRR, no murmurs, rubs, or gallops Ab: + BS, soft, there is tenderness to palpation in the  epigastric region, as well as in the left and right upper quadrants, no rebound, no guarding, no organomegaly,  Ext: No clubbing or cyanosis, no edema  Assessment/Plan:  1. Headaches: Consistent with patients past history of migraines. Will give toradol/phenergan injection here in the office for pain control and will increase propranolol to 40 mg twice a day for prophylaxis. Advised to follow up with her PCP in 2 weeks if still having symptoms.  2. Abdominal pain: Epigastric tenderness that is made worse with eating and a history of GERD are suspicious for PUD. Because not improved with Nexium will check H. Pylori antibody test in the office to evaluate for need of triple therapy. Also, given history of hyperlipidemia, will amylase and lipase to rule out pancreatitis.

## 2012-03-19 NOTE — Patient Instructions (Addendum)
Increase propranolol to 40 mg twice a day- new prescription sent  Will check for H pylori bacteria as well as inflammation of gallbladder and pancreas in your labwork  Follow-up with primary doctor in 2-3 weeks

## 2012-03-19 NOTE — Assessment & Plan Note (Addendum)
Mild pain.  Will check h pylori, CBC, cmet, lipase.    Update:  All above labs do not indicate cause for pain

## 2012-03-19 NOTE — Assessment & Plan Note (Addendum)
Given Toradol 30, phenergan, dexamethasone in office today.  Will increase propranolol, asked to follow-up with PCP in 2-3 weeks for further care of headache.  IF pulse will not tolerate to optimal dose of propranolol for prevention, may benefit from alternative prophylactic therapy

## 2012-03-19 NOTE — Progress Notes (Signed)
  Subjective:    Patient ID: Kelsey Santana, female    DOB: 16-May-1961, 51 y.o.   MRN: [id number] HPI Work in for headaches and abdominal pain.  I have reviewed patient's  PMH, FH, and Social history and Medications as related to this visit.  Headache: 2 weeks.  starts on left side, light and sound sensitivity.  Typical for her migraines headaches.  Has been a patient at a headache center.  States imitrex has not helped.  Previously had been helped with wellbutrin.  States trigger point injections has helped as well as demerol in the past.    Abdominal pain:  1 month, at last OV, thought was related to GERD.  Described as epigastric tenderness, stabbing pain. Radiating bilaterally.  Questions if related to new weight watchers diet.  Has started taking nexium with no improvement.  Usually after eating or drinking.  Currently not in pain.   I have reviewed patient's  PMH, FH, and Social history and Medications as related to this visit. Sig for history of GERD and Migraines  Review of Systems See HPI    Objective:   Physical Exam GEN: Alert & Oriented, No acute distress CV:  Regular Rate & Rhythm, no murmur Respiratory:  Normal work of breathing, CTAB Abd:  + BS, soft, mild TTP epigastric area and right upper and left upper quadrant Ext: no pre-tibial edema          Assessment & Plan:

## 2012-03-20 ENCOUNTER — Encounter: Payer: Self-pay | Admitting: Family Medicine

## 2012-03-29 ENCOUNTER — Other Ambulatory Visit: Payer: Self-pay | Admitting: Family Medicine

## 2012-03-29 MED ORDER — BENAZEPRIL HCL 20 MG PO TABS: 20.0000 mg | ORAL_TABLET | Freq: Every day | ORAL | Status: DC

## 2012-03-29 NOTE — Telephone Encounter (Signed)
Patient informed of message from MD, expressed understanding. 

## 2012-03-29 NOTE — Telephone Encounter (Signed)
[facility]  I put in to fax this to them.  She also requests different medication for migraine prophylaxis.  Patient has an appointment on Tuesday to follow this up.  Will see her then and determine the best course of action regarding migraine prophylaxis.

## 2012-03-29 NOTE — Telephone Encounter (Signed)
Patient is calling needing a refill for Lot[facility] and the Inderal makes her chest hurt bad and she would like to try something else.

## 2012-04-02 ENCOUNTER — Encounter: Payer: Self-pay | Admitting: Family Medicine

## 2012-04-09 ENCOUNTER — Other Ambulatory Visit: Payer: Self-pay | Admitting: Family Medicine

## 2012-04-09 DIAGNOSIS — I10 Essential (primary) hypertension: Secondary | ICD-10-CM

## 2012-04-09 NOTE — Telephone Encounter (Signed)
Patient is calling because [facility]'t [facility] Lotensin so she will need a different medication sent in instead.

## 2012-04-09 NOTE — Telephone Encounter (Signed)
Called the [facility] department.  They only have lisinopril (any strength) available.  Are you will to change?

## 2012-04-10 MED ORDER — LISINOPRIL 20 MG PO TABS: 20.0000 mg | ORAL_TABLET | Freq: Every day | ORAL | Status: DC

## 2012-04-10 NOTE — Telephone Encounter (Signed)
Called in Rx verbally.  Pt has appt with Shawnie Pons on the 23 of this month.  Milas Gain, Maryjo Rochester

## 2012-04-10 NOTE — Telephone Encounter (Signed)
Wrote prescription for lisinopril 20 mg to be [facility]ed to [facility].  Patient missed last appointment and really needs to come in for a follow-up appointment before anymore medications are refilled. Please let me know if there is anything else I need to do to get this prescription filled.

## 2012-04-15 ENCOUNTER — Encounter: Payer: Self-pay | Admitting: Family Medicine

## 2012-04-15 ENCOUNTER — Ambulatory Visit (INDEPENDENT_AMBULATORY_CARE_PROVIDER_SITE_OTHER): Payer: Self-pay | Admitting: Family Medicine

## 2012-04-15 VITALS — BP 130/85 | HR 60 | Ht 64.0 in | Wt 277.0 lb

## 2012-04-15 DIAGNOSIS — E669 Obesity, unspecified: Secondary | ICD-10-CM

## 2012-04-15 DIAGNOSIS — E785 Hyperlipidemia, unspecified: Secondary | ICD-10-CM

## 2012-04-15 DIAGNOSIS — B354 Tinea corporis: Secondary | ICD-10-CM

## 2012-04-15 DIAGNOSIS — Z1231 Encounter for screening mammogram for malignant neoplasm of breast: Secondary | ICD-10-CM

## 2012-04-15 DIAGNOSIS — I10 Essential (primary) hypertension: Secondary | ICD-10-CM

## 2012-04-15 DIAGNOSIS — G43909 Migraine, unspecified, not intractable, without status migrainosus: Secondary | ICD-10-CM

## 2012-04-15 DIAGNOSIS — R5383 Other fatigue: Secondary | ICD-10-CM | POA: Insufficient documentation

## 2012-04-15 DIAGNOSIS — F329 Major depressive disorder, single episode, unspecified: Secondary | ICD-10-CM

## 2012-04-15 DIAGNOSIS — Z0000 Encounter for general adult medical examination without abnormal findings: Secondary | ICD-10-CM

## 2012-04-15 MED ORDER — BUPROPION HCL ER (XL) 150 MG PO TB24: 150.0000 mg | ORAL_TABLET | Freq: Every day | ORAL | Status: DC

## 2012-04-15 MED ORDER — ATORVASTATIN CALCIUM 10 MG PO TABS: 10.0000 mg | ORAL_TABLET | Freq: Every day | ORAL | Status: DC

## 2012-04-15 MED ORDER — NYSTATIN 100000 UNIT/GM EX CREA: TOPICAL_CREAM | Freq: Two times a day (BID) | CUTANEOUS | Status: AC

## 2012-04-15 MED ORDER — BENAZEPRIL HCL 20 MG PO TABS: 20.0000 mg | ORAL_TABLET | Freq: Every day | ORAL | Status: DC

## 2012-04-15 MED ORDER — BUTALBITAL-APAP-CAFFEINE 50-325-40 MG PO TABS: 1.0000 | ORAL_TABLET | Freq: Four times a day (QID) | ORAL | Status: AC | PRN

## 2012-04-15 NOTE — Assessment & Plan Note (Signed)
Continue weight loss and exercise.

## 2012-04-15 NOTE — Assessment & Plan Note (Signed)
Refill medication

## 2012-04-15 NOTE — Assessment & Plan Note (Addendum)
Refilled lipitor. The patient will return for a fasting lipid panel.

## 2012-04-15 NOTE — Assessment & Plan Note (Signed)
Trial of Nystatin. 

## 2012-04-15 NOTE — Progress Notes (Signed)
  Subjective:    Patient ID: Kelsey Santana, female    DOB: Sep 28, 1960, 51 y.o.   MRN: [id number]  HPI Here today for CPD. The patient reports frequent migraine headaches. Was recently placed on propranolol this office however that medication today for chest pain. Additionally the patient reports intermittent epigastric pain since her hysterectomy. She has not ever had her gallbladder removed. The patient has not had a lipid check for some time. It has been approximately 10 years of her last colonoscopy. The patient had a mammogram approximately 1 year ago. The patient needs refills on her blood pressure medication as well as Wellbutrin which was prescribed previously for her migraine headaches and depression.   Review of Systems  Constitutional: Negative for fever and chills.  HENT: Negative for nosebleeds, congestion and rhinorrhea.   Eyes: Negative for visual disturbance.  Respiratory: Negative for chest tightness and shortness of breath.   Cardiovascular: Negative for chest pain.  Gastrointestinal: Negative for nausea, vomiting, abdominal pain, diarrhea, constipation, blood in stool and abdominal distention.  Genitourinary: Negative for dysuria, frequency and vaginal bleeding.  Musculoskeletal: Negative for arthralgias.  Skin: Negative for rash.  Neurological: Negative for dizziness and headaches.  Psychiatric/Behavioral: Negative for behavioral problems, disturbed wake/sleep cycle and dysphoric mood.  All other systems reviewed and are negative.       Objective:   Physical Exam  Vitals reviewed. Constitutional: She is oriented to person, place, and time. She appears well-developed and well-nourished.  HENT:  Head: Normocephalic and atraumatic.  Right Ear: Tympanic membrane normal.  Left Ear: Tympanic membrane normal.  Mouth/Throat: Oropharynx is clear and moist.  Eyes: Pupils are equal, round, and reactive to light. No scleral icterus.  Neck: Normal range of motion. Neck  supple. Carotid bruit is not present. No thyromegaly present.  Cardiovascular: Normal rate and regular rhythm.   No murmur heard. Pulmonary/Chest: Effort normal and breath sounds normal. She has no wheezes. Right breast exhibits no inverted nipple, no mass, no nipple discharge and no skin change. Left breast exhibits no inverted nipple, no mass, no nipple discharge and no skin change.  Abdominal: Soft. She exhibits no mass. There is no tenderness. There is no guarding.  Musculoskeletal: Normal range of motion.  Lymphadenopathy:    She has no cervical adenopathy.  Neurological: She is alert and oriented to person, place, and time.  Skin: Skin is warm and dry. No rash noted.  Psychiatric: She has a normal mood and affect. Her behavior is normal. Thought content normal.          Assessment & Plan:  Mammogram Patient does not need Pap smears since her hysterectomy was done for benign reasons and her cervix was removed. Schedule colonoscopy

## 2012-04-15 NOTE — Assessment & Plan Note (Signed)
Check TSH 

## 2012-04-15 NOTE — Assessment & Plan Note (Signed)
Trial of Fiorcet

## 2012-04-15 NOTE — Patient Instructions (Signed)
Preventive Care for Adults, Female A healthy lifestyle and preventive care can promote health and wellness. Preventive health guidelines for women include the following key practices.  A routine yearly physical is a good way to check with your caregiver about your health and preventive screening. It is a chance to share any concerns and updates on your health, and to receive a thorough exam.   Visit your dentist for a routine exam and preventive care every 6 months. Brush your teeth twice a day and floss once a day. Good oral hygiene prevents tooth decay and gum disease.   The frequency of eye exams is based on your age, health, family medical history, use of contact lenses, and other factors. Follow your caregiver's recommendations for frequency of eye exams.   Eat a healthy diet. Foods like vegetables, fruits, whole grains, low-fat dairy products, and lean protein foods contain the nutrients you need without too many calories. Decrease your intake of foods high in solid fats, added sugars, and salt. Eat the right amount of calories for you.Get information about a proper diet from your caregiver, if necessary.   Regular physical exercise is one of the most important things you can do for your health. Most adults should get at least 150 minutes of moderate-intensity exercise (any activity that increases your heart rate and causes you to sweat) each week. In addition, most adults need muscle-strengthening exercises on 2 or more days a week.   Maintain a healthy weight. The body mass index (BMI) is a screening tool to identify possible weight problems. It provides an estimate of body fat based on height and weight. Your caregiver can help determine your BMI, and can help you achieve or maintain a healthy weight.For adults 20 years and older:   A BMI below 18.5 is considered underweight.   A BMI of 18.5 to 24.9 is normal.   A BMI of 25 to 29.9 is considered overweight.   A BMI of 30 and above is  considered obese.   Maintain normal blood lipids and cholesterol levels by exercising and minimizing your intake of saturated fat. Eat a balanced diet with plenty of fruit and vegetables. Blood tests for lipids and cholesterol should begin at age 20 and be repeated every 5 years. If your lipid or cholesterol levels are high, you are over 50, or you are at high risk for heart disease, you may need your cholesterol levels checked more frequently.Ongoing high lipid and cholesterol levels should be treated with medicines if diet and exercise are not effective.   If you smoke, find out from your caregiver how to quit. If you do not use tobacco, do not start.   If you are pregnant, do not drink alcohol. If you are breastfeeding, be very cautious about drinking alcohol. If you are not pregnant and choose to drink alcohol, do not exceed 1 drink per day. One drink is considered to be 12 ounces (355 mL) of beer, 5 ounces (148 mL) of wine, or 1.5 ounces (44 mL) of liquor.   Avoid use of street drugs. Do not share needles with anyone. Ask for help if you need support or instructions about stopping the use of drugs.   High blood pressure causes heart disease and increases the risk of stroke. Your blood pressure should be checked at least every 1 to 2 years. Ongoing high blood pressure should be treated with medicines if weight loss and exercise are not effective.   If you are 55 to 52   years old, ask your caregiver if you should take aspirin to prevent strokes.   Diabetes screening involves taking a blood sample to check your fasting blood sugar level. This should be done once every 3 years, after age 45, if you are within normal weight and without risk factors for diabetes. Testing should be considered at a younger age or be carried out more frequently if you are overweight and have at least 1 risk factor for diabetes.   Breast cancer screening is essential preventive care for women. You should practice "breast  self-awareness." This means understanding the normal appearance and feel of your breasts and may include breast self-examination. Any changes detected, no matter how small, should be reported to a caregiver. Women in their 20s and 30s should have a clinical breast exam (CBE) by a caregiver as part of a regular health exam every 1 to 3 years. After age 40, women should have a CBE every year. Starting at age 40, women should consider having a mammography (breast X-ray test) every year. Women who have a family history of breast cancer should talk to their caregiver about genetic screening. Women at a high risk of breast cancer should talk to their caregivers about having magnetic resonance imaging (MRI) and a mammography every year.   The Pap test is a screening test for cervical cancer. A Pap test can show cell changes on the cervix that might become cervical cancer if left untreated. A Pap test is a procedure in which cells are obtained and examined from the lower end of the uterus (cervix).   Women should have a Pap test starting at age 21.   Between ages 21 and 29, Pap tests should be repeated every 2 years.   Beginning at age 30, you should have a Pap test every 3 years as long as the past 3 Pap tests have been normal.   Some women have medical problems that increase the chance of getting cervical cancer. Talk to your caregiver about these problems. It is especially important to talk to your caregiver if a new problem develops soon after your last Pap test. In these cases, your caregiver may recommend more frequent screening and Pap tests.   The above recommendations are the same for women who have or have not gotten the vaccine for human papillomavirus (HPV).   If you had a hysterectomy for a problem that was not cancer or a condition that could lead to cancer, then you no longer need Pap tests. Even if you no longer need a Pap test, a regular exam is a good idea to make sure no other problems are  starting.   If you are between ages 65 and 70, and you have had normal Pap tests going back 10 years, you no longer need Pap tests. Even if you no longer need a Pap test, a regular exam is a good idea to make sure no other problems are starting.   If you have had past treatment for cervical cancer or a condition that could lead to cancer, you need Pap tests and screening for cancer for at least 20 years after your treatment.   If Pap tests have been discontinued, risk factors (such as a new sexual partner) need to be reassessed to determine if screening should be resumed.   The HPV test is an additional test that may be used for cervical cancer screening. The HPV test looks for the virus that can cause the cell changes on the cervix.   The cells collected during the Pap test can be tested for HPV. The HPV test could be used to screen women aged 30 years and older, and should be used in women of any age who have unclear Pap test results. After the age of 30, women should have HPV testing at the same frequency as a Pap test.   Colorectal cancer can be detected and often prevented. Most routine colorectal cancer screening begins at the age of 50 and continues through age 75. However, your caregiver may recommend screening at an earlier age if you have risk factors for colon cancer. On a yearly basis, your caregiver may provide home test kits to check for hidden blood in the stool. Use of a small camera at the end of a tube, to directly examine the colon (sigmoidoscopy or colonoscopy), can detect the earliest forms of colorectal cancer. Talk to your caregiver about this at age 50, when routine screening begins. Direct examination of the colon should be repeated every 5 to 10 years through age 75, unless early forms of pre-cancerous polyps or small growths are found.   Hepatitis C blood testing is recommended for all people born from 1945 through 1965 and any individual with known risks for hepatitis C.    Practice safe sex. Use condoms and avoid high-risk sexual practices to reduce the spread of sexually transmitted infections (STIs). STIs include gonorrhea, chlamydia, syphilis, trichomonas, herpes, HPV, and human immunodeficiency virus (HIV). Herpes, HIV, and HPV are viral illnesses that have no cure. They can result in disability, cancer, and death. Sexually active women aged 25 and younger should be checked for chlamydia. Older women with new or multiple partners should also be tested for chlamydia. Testing for other STIs is recommended if you are sexually active and at increased risk.   Osteoporosis is a disease in which the bones lose minerals and strength with aging. This can result in serious bone fractures. The risk of osteoporosis can be identified using a bone density scan. Women ages 65 and over and women at risk for fractures or osteoporosis should discuss screening with their caregivers. Ask your caregiver whether you should take a calcium supplement or vitamin D to reduce the rate of osteoporosis.   Menopause can be associated with physical symptoms and risks. Hormone replacement therapy is available to decrease symptoms and risks. You should talk to your caregiver about whether hormone replacement therapy is right for you.   Use sunscreen with sun protection factor (SPF) of 30 or more. Apply sunscreen liberally and repeatedly throughout the day. You should seek shade when your shadow is shorter than you. Protect yourself by wearing long sleeves, pants, a wide-brimmed hat, and sunglasses year round, whenever you are outdoors.   Once a month, do a whole body skin exam, using a mirror to look at the skin on your back. Notify your caregiver of new moles, moles that have irregular borders, moles that are larger than a pencil eraser, or moles that have changed in shape or color.   Stay current with required immunizations.   Influenza. You need a dose every fall (or winter). The composition of  the flu vaccine changes each year, so being vaccinated once is not enough.   Pneumococcal polysaccharide. You need 1 to 2 doses if you smoke cigarettes or if you have certain chronic medical conditions. You need 1 dose at age 65 (or older) if you have never been vaccinated.   Tetanus, diphtheria, pertussis (Tdap, Td). Get 1 dose of   Tdap vaccine if you are younger than age 65, are over 65 and have contact with an infant, are a healthcare worker, are pregnant, or simply want to be protected from whooping cough. After that, you need a Td booster dose every 10 years. Consult your caregiver if you have not had at least 3 tetanus and diphtheria-containing shots sometime in your life or have a deep or dirty wound.   HPV. You need this vaccine if you are a woman age 26 or younger. The vaccine is given in 3 doses over 6 months.   Measles, mumps, rubella (MMR). You need at least 1 dose of MMR if you were born in 1957 or later. You may also need a second dose.   Meningococcal. If you are age 19 to 21 and a first-year college student living in a residence hall, or have one of several medical conditions, you need to get vaccinated against meningococcal disease. You may also need additional booster doses.   Zoster (shingles). If you are age 60 or older, you should get this vaccine.   Varicella (chickenpox). If you have never had chickenpox or you were vaccinated but received only 1 dose, talk to your caregiver to find out if you need this vaccine.   Hepatitis A. You need this vaccine if you have a specific risk factor for hepatitis A virus infection or you simply wish to be protected from this disease. The vaccine is usually given as 2 doses, 6 to 18 months apart.   Hepatitis B. You need this vaccine if you have a specific risk factor for hepatitis B virus infection or you simply wish to be protected from this disease. The vaccine is given in 3 doses, usually over 6 months.  Preventive Services /  Frequency Ages 19 to 39  Blood pressure check.** / Every 1 to 2 years.   Lipid and cholesterol check.** / Every 5 years beginning at age 20.   Clinical breast exam.** / Every 3 years for women in their 20s and 30s.   Pap test.** / Every 2 years from ages 21 through 29. Every 3 years starting at age 30 through age 65 or 70 with a history of 3 consecutive normal Pap tests.   HPV screening.** / Every 3 years from ages 30 through ages 65 to 70 with a history of 3 consecutive normal Pap tests.   Hepatitis C blood test.** / For any individual with known risks for hepatitis C.   Skin self-exam. / Monthly.   Influenza immunization.** / Every year.   Pneumococcal polysaccharide immunization.** / 1 to 2 doses if you smoke cigarettes or if you have certain chronic medical conditions.   Tetanus, diphtheria, pertussis (Tdap, Td) immunization. / A one-time dose of Tdap vaccine. After that, you need a Td booster dose every 10 years.   HPV immunization. / 3 doses over 6 months, if you are 26 and younger.   Measles, mumps, rubella (MMR) immunization. / You need at least 1 dose of MMR if you were born in 1957 or later. You may also need a second dose.   Meningococcal immunization. / 1 dose if you are age 19 to 21 and a first-year college student living in a residence hall, or have one of several medical conditions, you need to get vaccinated against meningococcal disease. You may also need additional booster doses.   Varicella immunization.** / Consult your caregiver.   Hepatitis A immunization.** / Consult your caregiver. 2 doses, 6 to 18 months   apart.   Hepatitis B immunization.** / Consult your caregiver. 3 doses usually over 6 months.  Ages 40 to 64  Blood pressure check.** / Every 1 to 2 years.   Lipid and cholesterol check.** / Every 5 years beginning at age 20.   Clinical breast exam.** / Every year after age 40.   Mammogram.** / Every year beginning at age 40 and continuing for as  long as you are in good health. Consult with your caregiver.   Pap test.** / Every 3 years starting at age 30 through age 65 or 70 with a history of 3 consecutive normal Pap tests.   HPV screening.** / Every 3 years from ages 30 through ages 65 to 70 with a history of 3 consecutive normal Pap tests.   Fecal occult blood test (FOBT) of stool. / Every year beginning at age 50 and continuing until age 75. You may not need to do this test if you get a colonoscopy every 10 years.   Flexible sigmoidoscopy or colonoscopy.** / Every 5 years for a flexible sigmoidoscopy or every 10 years for a colonoscopy beginning at age 50 and continuing until age 75.   Hepatitis C blood test.** / For all people born from 1945 through 1965 and any individual with known risks for hepatitis C.   Skin self-exam. / Monthly.   Influenza immunization.** / Every year.   Pneumococcal polysaccharide immunization.** / 1 to 2 doses if you smoke cigarettes or if you have certain chronic medical conditions.   Tetanus, diphtheria, pertussis (Tdap, Td) immunization.** / A one-time dose of Tdap vaccine. After that, you need a Td booster dose every 10 years.   Measles, mumps, rubella (MMR) immunization. / You need at least 1 dose of MMR if you were born in 1957 or later. You may also need a second dose.   Varicella immunization.** / Consult your caregiver.   Meningococcal immunization.** / Consult your caregiver.   Hepatitis A immunization.** / Consult your caregiver. 2 doses, 6 to 18 months apart.   Hepatitis B immunization.** / Consult your caregiver. 3 doses, usually over 6 months.  Ages 65 and over  Blood pressure check.** / Every 1 to 2 years.   Lipid and cholesterol check.** / Every 5 years beginning at age 20.   Clinical breast exam.** / Every year after age 40.   Mammogram.** / Every year beginning at age 40 and continuing for as long as you are in good health. Consult with your caregiver.   Pap test.** /  Every 3 years starting at age 30 through age 65 or 70 with a 3 consecutive normal Pap tests. Testing can be stopped between 65 and 70 with 3 consecutive normal Pap tests and no abnormal Pap or HPV tests in the past 10 years.   HPV screening.** / Every 3 years from ages 30 through ages 65 or 70 with a history of 3 consecutive normal Pap tests. Testing can be stopped between 65 and 70 with 3 consecutive normal Pap tests and no abnormal Pap or HPV tests in the past 10 years.   Fecal occult blood test (FOBT) of stool. / Every year beginning at age 50 and continuing until age 75. You may not need to do this test if you get a colonoscopy every 10 years.   Flexible sigmoidoscopy or colonoscopy.** / Every 5 years for a flexible sigmoidoscopy or every 10 years for a colonoscopy beginning at age 50 and continuing until age 75.   Hepatitis   C blood test.** / For all people born from [birth year] through 1965 and any individual with known risks for hepatitis C.   Osteoporosis screening.** / A one-time screening for women ages 70 and over and women at risk for fractures or osteoporosis.   Skin self-exam. / Monthly.   Influenza immunization.** / Every year.   Pneumococcal polysaccharide immunization.** / 1 dose at age 66 (or older) if you have never been vaccinated.   Tetanus, diphtheria, pertussis (Tdap, Td) immunization. / A one-time dose of Tdap vaccine if you are over 65 and have contact with an infant, are a Research scientist (physical sciences), or simply want to be protected from whooping cough. After that, you need a Td booster dose every 10 years.   Varicella immunization.** / Consult your caregiver.   Meningococcal immunization.** / Consult your caregiver.   Hepatitis A immunization.** / Consult your caregiver. 2 doses, 6 to 18 months apart.   Hepatitis B immunization.** / Check with your caregiver. 3 doses, usually over 6 months.  ** Family history and personal history of risk and conditions may change your caregiver's  recommendations. Document Released: 10/03/2001 Document Revised: 07/27/2011 Document Reviewed: 01/02/2011 [facility] Patient Information 2012 [place], [place].Fatigue Fatigue is a feeling of tiredness, lack of energy, lack of motivation, or feeling tired all the time. Having enough rest, good nutrition, and reducing stress will normally reduce fatigue. Consult your caregiver if it persists. The nature of your fatigue will help your caregiver to find out its cause. The treatment is based on the cause.  CAUSES  There are many causes for fatigue. Most of the time, fatigue can be traced to one or more of your habits or routines. Most causes fit into one or more of three general areas. They are: Lifestyle problems  Sleep disturbances.   Overwork.   Physical exertion.   Unhealthy habits.   Poor eating habits or eating disorders.   Alcohol and/or drug use .   Lack of proper nutrition (malnutrition).  Psychological problems  Stress and/or anxiety problems.   Depression.   Grief.   Boredom.  Medical Problems or Conditions  Anemia.   Pregnancy.   Thyroid gland problems.   Recovery from major surgery.   Continuous pain.   Emphysema or asthma that is not well controlled   Allergic conditions.   Diabetes.   Infections (such as mononucleosis).   Obesity.   Sleep disorders, such as sleep apnea.   Heart failure or other heart-related problems.   Cancer.   Kidney disease.   Liver disease.   Effects of certain medicines such as antihistamines, cough and cold remedies, prescription pain medicines, heart and blood pressure medicines, drugs used for treatment of cancer, and some antidepressants.  SYMPTOMS  The symptoms of fatigue include:   Lack of energy.   Lack of drive (motivation).   Drowsiness.   Feeling of indifference to the surroundings.  DIAGNOSIS  The details of how you feel help guide your caregiver in finding out what is causing the fatigue. You will be  asked about your present and past health condition. It is important to review all medicines that you take, including prescription and non-prescription items. A thorough exam will be done. You will be questioned about your feelings, habits, and normal lifestyle. Your caregiver may suggest blood tests, urine tests, or other tests to look for common medical causes of fatigue.  TREATMENT  Fatigue is treated by correcting the underlying cause. For example, if you have continuous pain or depression, treating these  causes will improve how you feel. Similarly, adjusting the dose of certain medicines will help in reducing fatigue.  HOME CARE INSTRUCTIONS   Try to get the required amount of good sleep every night.   Eat a healthy and nutritious diet, and drink enough water throughout the day.   Practice ways of relaxing (including yoga or meditation).   Exercise regularly.   Make plans to change situations that cause stress. Act on those plans so that stresses decrease over time. Keep your work and personal routine reasonable.   Avoid street drugs and minimize use of alcohol.   Start taking a daily multivitamin after consulting your caregiver.  SEEK MEDICAL CARE IF:   You have persistent tiredness, which cannot be accounted for.   You have fever.   You have unintentional weight loss.   You have headaches.   You have disturbed sleep throughout the night.   You are feeling sad.   You have constipation.   You have dry skin.   You have gained weight.   You are taking any new or different medicines that you suspect are causing fatigue.   You are unable to sleep at night.   You develop any unusual swelling of your legs or other parts of your body.  SEEK IMMEDIATE MEDICAL CARE IF:   You are feeling confused.   Your vision is blurred.   You feel faint or pass out.   You develop severe headache.   You develop severe abdominal, pelvic, or back pain.   You develop chest pain,  shortness of breath, or an irregular or fast heartbeat.   You are unable to pass a normal amount of urine.   You develop abnormal bleeding such as bleeding from the rectum or you vomit blood.   You have thoughts about harming yourself or committing suicide.   You are worried that you might harm someone else.  MAKE SURE YOU:   Understand these instructions.   Will watch your condition.   Will get help right away if you are not doing well or get worse.  Document Released: 06/04/2007 Document Revised: 07/27/2011 Document Reviewed: 06/04/2007 [facility] Patient Information 2012 [place], [place].Hypertriglyceridemia  Diet for High blood levels of Triglycerides Most fats in food are triglycerides. Triglycerides in your blood are stored as fat in your body. High levels of triglycerides in your blood may put you at a greater risk for heart disease and stroke.  Normal triglyceride levels are less than 150 mg/dL. Borderline high levels are 150-199 mg/dl. High levels are 200 - 499 mg/dL, and very high triglyceride levels are greater than 500 mg/dL. The decision to treat high triglycerides is generally based on the level. For people with borderline or high triglyceride levels, treatment includes weight loss and exercise. Drugs are recommended for people with very high triglyceride levels. Many people who need treatment for high triglyceride levels have metabolic syndrome. This syndrome is a collection of disorders that often include: insulin resistance, high blood pressure, blood clotting problems, high cholesterol and triglycerides. TESTING PROCEDURE FOR TRIGLYCERIDES  You should not eat 4 hours before getting your triglycerides measured. The normal range of triglycerides is between 10 and 250 milligrams per deciliter (mg/dl). Some people may have extreme levels (1000 or above), but your triglyceride level may be too high if it is above 150 mg/dl, depending on what other risk factors you have for heart  disease.   People with high blood triglycerides may also have high blood cholesterol levels. If you  have high blood cholesterol as well as high blood triglycerides, your risk for heart disease is probably greater than if you only had high triglycerides. High blood cholesterol is one of the main risk factors for heart disease.  CHANGING YOUR DIET  Your weight can affect your blood triglyceride level. If you are more than 20% above your ideal body weight, you may be able to lower your blood triglycerides by losing weight. Eating less and exercising regularly is the best way to combat this. Fat provides more calories than any other food. The best way to lose weight is to eat less fat. Only 30% of your total calories should come from fat. Less than 7% of your diet should come from saturated fat. A diet low in fat and saturated fat is the same as a diet to decrease blood cholesterol. By eating a diet lower in fat, you may lose weight, lower your blood cholesterol, and lower your blood triglyceride level.  Eating a diet low in fat, especially saturated fat, may also help you lower your blood triglyceride level. Ask your dietitian to help you figure how much fat you can eat based on the number of calories your caregiver has prescribed for you.  Exercise, in addition to helping with weight loss may also help lower triglyceride levels.   Alcohol can increase blood triglycerides. You may need to stop drinking alcoholic beverages.   Too much carbohydrate in your diet may also increase your blood triglycerides. Some complex carbohydrates are necessary in your diet. These may include bread, rice, potatoes, other starchy vegetables and cereals.   Reduce "simple" carbohydrates. These may include pure sugars, candy, honey, and jelly without losing other nutrients. If you have the kind of high blood triglycerides that is affected by the amount of carbohydrates in your diet, you will need to eat less sugar and less  high-sugar foods. Your caregiver can help you with this.   Adding 2-4 grams of fish oil (EPA+ DHA) may also help lower triglycerides. Speak with your caregiver before adding any supplements to your regimen.  Following the Diet  Maintain your ideal weight. Your caregivers can help you with a diet. Generally, eating less food and getting more exercise will help you lose weight. Joining a weight control group may also help. Ask your caregivers for a good weight control group in your area.  Eat low-fat foods instead of high-fat foods. This can help you lose weight too.  These foods are lower in fat. Eat MORE of these:   Dried beans, peas, and lentils.   Egg whites.   Low-fat cottage cheese.   Fish.   Lean cuts of meat, such as round, sirloin, rump, and flank (cut extra fat off meat you fix).   Whole grain breads, cereals and pasta.   Skim and nonfat dry milk.   Low-fat yogurt.   Poultry without the skin.   Cheese made with skim or part-skim milk, such as mozzarella, parmesan, farmers', ricotta, or pot cheese.  These are higher fat foods. Eat LESS of these:   Whole milk and foods made from whole milk, such as American, blue, cheddar, monterey jack, and swiss cheese   High-fat meats, such as luncheon meats, sausages, knockwurst, bratwurst, hot dogs, ribs, corned beef, ground pork, and regular ground beef.   Fried foods.  Limit saturated fats in your diet. Substituting unsaturated fat for saturated fat may decrease your blood triglyceride level. You will need to read package labels to know which products contain  saturated fats.  These foods are high in saturated fat. Eat LESS of these:   Fried pork skins.   Whole milk.   Skin and fat from poultry.   Palm oil.   Butter.   Shortening.   Cream cheese.   Tomasa Blase.   Margarines and baked goods made from listed oils.   Vegetable shortenings.   Chitterlings.   Fat from meats.   Coconut oil.   Palm kernel oil.   Lard.    Cream.   Sour cream.   Fatback.   Coffee whiteners and non-dairy creamers made with these oils.   Cheese made from whole milk.  Use unsaturated fats (both polyunsaturated and monounsaturated) moderately. Remember, even though unsaturated fats are better than saturated fats; you still want a diet low in total fat.  These foods are high in unsaturated fat:   Canola oil.   Sunflower oil.   Mayonnaise.   Almonds.   Peanuts.   Pine nuts.   Margarines made with these oils.   Safflower oil.   Olive oil.   Avocados.   Cashews.   Peanut butter.   Sunflower seeds.   Soybean oil.   Peanut oil.   Olives.   Pecans.   Walnuts.   Pumpkin seeds.  Avoid sugar and other high-sugar foods. This will decrease carbohydrates without decreasing other nutrients. Sugar in your food goes rapidly to your blood. When there is excess sugar in your blood, your liver may use it to make more triglycerides. Sugar also contains calories without other important nutrients.  Eat LESS of these:   Sugar, brown sugar, powdered sugar, jam, jelly, preserves, honey, syrup, molasses, pies, candy, cakes, cookies, frosting, pastries, colas, soft drinks, punches, fruit drinks, and regular gelatin.   Avoid alcohol. Alcohol, even more than sugar, may increase blood triglycerides. In addition, alcohol is high in calories and low in nutrients. Ask for sparkling water, or a diet soft drink instead of an alcoholic beverage.  Suggestions for planning and preparing meals   Bake, broil, grill or roast meats instead of frying.   Remove fat from meats and skin from poultry before cooking.   Add spices, herbs, lemon juice or vinegar to vegetables instead of salt, rich sauces or gravies.   Use a non-stick skillet without fat or use no-stick sprays.   Cool and refrigerate stews and broth. Then remove the hardened fat floating on the surface before serving.   Refrigerate meat drippings and skim off fat to  make low-fat gravies.   Serve more fish.   Use less butter, margarine and other high-fat spreads on bread or vegetables.   Use skim or reconstituted non-fat dry milk for cooking.   Cook with low-fat cheeses.   Substitute low-fat yogurt or cottage cheese for all or part of the sour cream in recipes for sauces, dips or congealed salads.   Use half yogurt/half mayonnaise in salad recipes.   Substitute evaporated skim milk for cream. Evaporated skim milk or reconstituted non-fat dry milk can be whipped and substituted for whipped cream in certain recipes.   Choose fresh fruits for dessert instead of high-fat foods such as pies or cakes. Fruits are naturally low in fat.  When Dining Out   Order low-fat appetizers such as fruit or vegetable juice, pasta with vegetables or tomato sauce.   Select clear, rather than cream soups.   Ask that dressings and gravies be served on the side. Then use less of them.   Order foods that are  baked, broiled, poached, steamed, stir-fried, or roasted.   Ask for margarine instead of butter, and use only a small amount.   Drink sparkling water, unsweetened tea or coffee, or diet soft drinks instead of alcohol or other sweet beverages.  QUESTIONS AND ANSWERS ABOUT OTHER FATS IN THE BLOOD: SATURATED FAT, TRANS FAT, AND CHOLESTEROL What is trans fat? Trans fat is a type of fat that is formed when vegetable oil is hardened through a process called hydrogenation. This process helps makes foods more solid, gives them shape, and prolongs their shelf life. Trans fats are also called hydrogenated or partially hydrogenated oils.  What do saturated fat, trans fat, and cholesterol in foods have to do with heart disease? Saturated fat, trans fat, and cholesterol in the diet all raise the level of LDL "bad" cholesterol in the blood. The higher the LDL cholesterol, the greater the risk for coronary heart disease (CHD). Saturated fat and trans fat raise LDL similarly.  What  foods contain saturated fat, trans fat, and cholesterol? High amounts of saturated fat are found in animal products, such as fatty cuts of meat, chicken skin, and full-fat dairy products like butter, whole milk, cream, and cheese, and in tropical vegetable oils such as palm, palm kernel, and coconut oil. Trans fat is found in some of the same foods as saturated fat, such as vegetable shortening, some margarines (especially hard or stick margarine), crackers, cookies, baked goods, fried foods, salad dressings, and other processed foods made with partially hydrogenated vegetable oils. Small amounts of trans fat also occur naturally in some animal products, such as milk products, beef, and lamb. Foods high in cholesterol include liver, other organ meats, egg yolks, shrimp, and full-fat dairy products. How can I use the new food label to make heart-healthy food choices? Check the Nutrition Facts panel of the food label. Choose foods lower in saturated fat, trans fat, and cholesterol. For saturated fat and cholesterol, you can also use the Percent Daily Value (%DV): 5% DV or less is low, and 20% DV or more is high. (There is no %DV for trans fat.) Use the Nutrition Facts panel to choose foods low in saturated fat and cholesterol, and if the trans fat is not listed, read the ingredients and limit products that list shortening or hydrogenated or partially hydrogenated vegetable oil, which tend to be high in trans fat. POINTS TO REMEMBER: YOU NEED A LITTLE TLC (THERAPEUTIC LIFESTYLE CHANGES)  Discuss your risk for heart disease with your caregivers, and take steps to reduce risk factors.   Change your diet. Choose foods that are low in saturated fat, trans fat, and cholesterol.   Add exercise to your daily routine if it is not already being done. Participate in physical activity of moderate intensity, like brisk walking, for at least 30 minutes on most, and preferably all days of the week. No time? Break the 30  minutes into three, 10-minute segments during the day.   Stop smoking. If you do smoke, contact your caregiver to discuss ways in which they can help you quit.   Do not use street drugs.   Maintain a normal weight.   Maintain a healthy blood pressure.   Keep up with your blood work for checking the fats in your blood as directed by your caregiver.  Document Released: 05/25/2004 Document Revised: 07/27/2011 Document Reviewed: 12/21/2008 [facility] Patient Information 2012 [place], [place].Hypertension As your heart beats, it forces blood through your arteries. This force is your blood pressure. If the  pressure is too high, it is called hypertension (HTN) or high blood pressure. HTN is dangerous because you may have it and not know it. High blood pressure may mean that your heart has to work harder to pump blood. Your arteries may be narrow or stiff. The extra work puts you at risk for heart disease, stroke, and other problems.  Blood pressure consists of two numbers, a higher number over a lower, 110/72, for example. It is stated as "110 over 72." The ideal is below 120 for the top number (systolic) and under 80 for the bottom (diastolic). Write down your blood pressure today. You should pay close attention to your blood pressure if you have certain conditions such as:  Heart failure.   Prior heart attack.   Diabetes   Chronic kidney disease.   Prior stroke.   Multiple risk factors for heart disease.  To see if you have HTN, your blood pressure should be measured while you are seated with your arm held at the level of the heart. It should be measured at least twice. A one-time elevated blood pressure reading (especially in the Emergency Department) does not mean that you need treatment. There may be conditions in which the blood pressure is different between your right and left arms. It is important to see your caregiver soon for a recheck. Most people have essential hypertension which means  that there is not a specific cause. This type of high blood pressure may be lowered by changing lifestyle factors such as:  Stress.   Smoking.   Lack of exercise.   Excessive weight.   Drug/tobacco/alcohol use.   Eating less salt.  Most people do not have symptoms from high blood pressure until it has caused damage to the body. Effective treatment can often prevent, delay or reduce that damage. TREATMENT  When a cause has been identified, treatment for high blood pressure is directed at the cause. There are a large number of medications to treat HTN. These fall into several categories, and your caregiver will help you select the medicines that are best for you. Medications may have side effects. You should review side effects with your caregiver. If your blood pressure stays high after you have made lifestyle changes or started on medicines,   Your medication(s) may need to be changed.   Other problems may need to be addressed.   Be certain you understand your prescriptions, and know how and when to take your medicine.   Be sure to follow up with your caregiver within the time frame advised (usually within two weeks) to have your blood pressure rechecked and to review your medications.   If you are taking more than one medicine to lower your blood pressure, make sure you know how and at what times they should be taken. Taking two medicines at the same time can result in blood pressure that is too low.  SEEK IMMEDIATE MEDICAL CARE IF:  You develop a severe headache, blurred or changing vision, or confusion.   You have unusual weakness or numbness, or a faint feeling.   You have severe chest or abdominal pain, vomiting, or breathing problems.  MAKE SURE YOU:   Understand these instructions.   Will watch your condition.   Will get help right away if you are not doing well or get worse.  Document Released: 08/07/2005 Document Revised: 07/27/2011 Document Reviewed:  03/27/2008 [facility] Patient Information 2012 [place], [place].Migraine Headache A migraine headache is an intense, throbbing pain on one  or both sides of your head. The exact cause of a migraine headache is not always known. A migraine may be caused when nerves in the brain become irritated and release chemicals that cause swelling within blood vessels, causing pain. Many migraine sufferers have a family history of migraines. Before you get a migraine you may or may not get an aura. An aura is a group of symptoms that can predict the beginning of a migraine. An aura may include:  Visual changes such as:   Flashing lights.   Bright spots or zig-zag lines.   Tunnel vision.   Feelings of numbness.   Trouble talking.   Muscle weakness.  SYMPTOMS  Pain on one or both sides of your head.   Pain that is pulsating or throbbing in nature.   Pain that is severe enough to prevent daily activities.   Pain that is aggravated by any daily physical activity.   Nausea (feeling sick to your stomach), vomiting, or both.   Pain with exposure to bright lights, loud noises, or activity.   General sensitivity to bright lights or loud noises.  MIGRAINE TRIGGERS Examples of triggers of migraine headaches include:   Alcohol.   Smoking.   Stress.   It may be related to menses (female menstruation).   Aged cheeses.   Foods or drinks that contain nitrates, glutamate, aspartame, or tyramine.   Lack of sleep.   Chocolate.   Caffeine.   Hunger.   Medications such as nitroglycerine (used to treat chest pain), birth control pills, estrogen, and some blood pressure medications.  DIAGNOSIS  A migraine headache is often diagnosed based on:  Symptoms.   Physical examination.   A computerized X-ray scan (computed tomography, CT) of your head.  TREATMENT  Medications can help prevent migraines if they are recurrent or should they become recurrent. Your caregiver can help you with a  medication or treatment program that will be helpful to you.   Lying down in a dark, quiet room may be helpful.   Keeping a headache diary may help you find a trend as to what may be triggering your headaches.  SEEK IMMEDIATE MEDICAL CARE IF:   You have confusion, personality changes or seizures.   You have headaches that wake you from sleep.   You have an increased frequency in your headaches.   You have a stiff neck.   You have a loss of vision.   You have muscle weakness.   You start losing your balance or have trouble walking.   You feel faint or pass out.  MAKE SURE YOU:   Understand these instructions.   Will watch your condition.   Will get help right away if you are not doing well or get worse.  Document Released: 08/07/2005 Document Revised: 07/27/2011 Document Reviewed: 03/23/2009 [facility] Patient Information 2012 [place], [place].

## 2012-04-16 ENCOUNTER — Telehealth: Payer: Self-pay | Admitting: Family Medicine

## 2012-04-16 ENCOUNTER — Other Ambulatory Visit: Payer: Self-pay

## 2012-04-16 DIAGNOSIS — E785 Hyperlipidemia, unspecified: Secondary | ICD-10-CM

## 2012-04-16 DIAGNOSIS — E669 Obesity, unspecified: Secondary | ICD-10-CM

## 2012-04-16 DIAGNOSIS — R5383 Other fatigue: Secondary | ICD-10-CM

## 2012-04-16 DIAGNOSIS — Z0000 Encounter for general adult medical examination without abnormal findings: Secondary | ICD-10-CM

## 2012-04-16 DIAGNOSIS — F329 Major depressive disorder, single episode, unspecified: Secondary | ICD-10-CM

## 2012-04-16 LAB — TSH: TSH: 1.393 u[IU]/mL (ref 0.350–4.500)

## 2012-04-16 LAB — LIPID PANEL
Cholesterol: 301 mg/dL — ABNORMAL HIGH (ref 0–200)
VLDL: 31 mg/dL (ref 0–40)

## 2012-04-16 MED ORDER — ATORVASTATIN CALCIUM 10 MG PO TABS: 10.0000 mg | ORAL_TABLET | Freq: Every day | ORAL | Status: DC

## 2012-04-16 MED ORDER — BUPROPION HCL ER (XL) 150 MG PO TB24: 150.0000 mg | ORAL_TABLET | Freq: Every day | ORAL | Status: DC

## 2012-04-16 NOTE — Telephone Encounter (Signed)
Spoke with patient and she has lost the rx . Advised will send message to Dr. Shawnie Pons to ask her to resend to [facility], [address]

## 2012-04-16 NOTE — Telephone Encounter (Signed)
Was given script for her Wellbutrin and Lipitor to [facility] - they didn't have either one and needs for [place] to send script to [facility] [address]

## 2012-04-16 NOTE — Progress Notes (Signed)
FLP AND TSH DONE TODAY Marit Goodwill 

## 2012-04-17 ENCOUNTER — Encounter: Payer: Self-pay | Admitting: Family Medicine

## 2012-04-23 ENCOUNTER — Telehealth: Payer: Self-pay | Admitting: Family Medicine

## 2012-04-23 NOTE — Telephone Encounter (Signed)
Wellbutrin and Lipitor authorized on 04/16/12 per Dr. Shawnie Pons.  Rx sent to "print" instead of "normal".  Called refills to [phone] and patient informed.  Gaylene Brooks, RN

## 2012-04-23 NOTE — Telephone Encounter (Signed)
States that the pharm doesn't have script for the Lipitor and Wellbutrin  [facility] [address]

## 2012-05-07 ENCOUNTER — Ambulatory Visit (HOSPITAL_COMMUNITY): Admission: RE | Admit: 2012-05-07 | Payer: Self-pay | Source: Ambulatory Visit

## 2012-05-22 ENCOUNTER — Ambulatory Visit (HOSPITAL_COMMUNITY)
Admission: RE | Admit: 2012-05-22 | Discharge: 2012-05-22 | Disposition: A | Discharge status: Home / Self Care | Payer: Self-pay | Source: Ambulatory Visit | Attending: Family Medicine | Admitting: Family Medicine

## 2012-05-22 DIAGNOSIS — Z1231 Encounter for screening mammogram for malignant neoplasm of breast: Secondary | ICD-10-CM | POA: Insufficient documentation

## 2012-09-25 ENCOUNTER — Ambulatory Visit: Payer: Self-pay | Admitting: Family Medicine

## 2013-05-26 ENCOUNTER — Emergency Department (HOSPITAL_COMMUNITY)
Admission: EM | Admit: 2013-05-26 | Discharge: 2013-05-26 | Disposition: A | Discharge status: Home / Self Care | Payer: Self-pay

## 2013-05-27 ENCOUNTER — Encounter (HOSPITAL_COMMUNITY): Payer: Self-pay | Admitting: Nurse Practitioner

## 2013-05-27 ENCOUNTER — Emergency Department (HOSPITAL_COMMUNITY): Payer: 59

## 2013-05-27 ENCOUNTER — Emergency Department (HOSPITAL_COMMUNITY)
Admission: EM | Admit: 2013-05-27 | Discharge: 2013-05-27 | Disposition: A | Discharge status: Home / Self Care | Payer: 59 | Attending: Emergency Medicine | Admitting: Emergency Medicine

## 2013-05-27 DIAGNOSIS — I10 Essential (primary) hypertension: Secondary | ICD-10-CM | POA: Insufficient documentation

## 2013-05-27 DIAGNOSIS — R5381 Other malaise: Secondary | ICD-10-CM | POA: Insufficient documentation

## 2013-05-27 DIAGNOSIS — Z79899 Other long term (current) drug therapy: Secondary | ICD-10-CM | POA: Insufficient documentation

## 2013-05-27 DIAGNOSIS — Z87891 Personal history of nicotine dependence: Secondary | ICD-10-CM | POA: Insufficient documentation

## 2013-05-27 DIAGNOSIS — M51379 Other intervertebral disc degeneration, lumbosacral region without mention of lumbar back pain or lower extremity pain: Secondary | ICD-10-CM | POA: Insufficient documentation

## 2013-05-27 DIAGNOSIS — M5431 Sciatica, right side: Secondary | ICD-10-CM

## 2013-05-27 DIAGNOSIS — E7800 Pure hypercholesterolemia, unspecified: Secondary | ICD-10-CM | POA: Insufficient documentation

## 2013-05-27 DIAGNOSIS — R079 Chest pain, unspecified: Secondary | ICD-10-CM

## 2013-05-27 DIAGNOSIS — M5137 Other intervertebral disc degeneration, lumbosacral region: Secondary | ICD-10-CM | POA: Insufficient documentation

## 2013-05-27 DIAGNOSIS — M5430 Sciatica, unspecified side: Secondary | ICD-10-CM | POA: Insufficient documentation

## 2013-05-27 DIAGNOSIS — M5432 Sciatica, left side: Secondary | ICD-10-CM

## 2013-05-27 DIAGNOSIS — Z8669 Personal history of other diseases of the nervous system and sense organs: Secondary | ICD-10-CM | POA: Insufficient documentation

## 2013-05-27 DIAGNOSIS — R200 Anesthesia of skin: Secondary | ICD-10-CM

## 2013-05-27 DIAGNOSIS — IMO0001 Reserved for inherently not codable concepts without codable children: Secondary | ICD-10-CM | POA: Insufficient documentation

## 2013-05-27 DIAGNOSIS — IMO0002 Reserved for concepts with insufficient information to code with codable children: Secondary | ICD-10-CM

## 2013-05-27 DIAGNOSIS — R209 Unspecified disturbances of skin sensation: Secondary | ICD-10-CM | POA: Insufficient documentation

## 2013-05-27 HISTORY — DX: Migraine, unspecified, not intractable, without status migrainosus: G43.909

## 2013-05-27 LAB — BASIC METABOLIC PANEL
BUN: 12 mg/dL (ref 6–23)
CO2: 30 mEq/L (ref 19–32)
Calcium: 9 mg/dL (ref 8.4–10.5)
Creatinine, Ser: 0.81 mg/dL (ref 0.50–1.10)
GFR calc Af Amer: >90 mL/min (ref >90)
Glucose, Bld: 99 mg/dL (ref 70–99)

## 2013-05-27 LAB — CBC
Hemoglobin: 13.3 g/dL (ref 12.0–15.0)
MCH: 28.8 pg (ref 26.0–34.0)
MCHC: 33.5 g/dL (ref 30.0–36.0)
MCV: 85.9 fL (ref 78.0–100.0)
RBC: 4.62 MIL/uL (ref 3.87–5.11)

## 2013-05-27 MED ORDER — CYCLOBENZAPRINE HCL 10 MG PO TABS: 10.0000 mg | ORAL_TABLET | Freq: Two times a day (BID) | ORAL | Status: DC | PRN

## 2013-05-27 MED ORDER — IBUPROFEN 600 MG PO TABS: 600.0000 mg | ORAL_TABLET | Freq: Four times a day (QID) | ORAL | Status: DC | PRN

## 2013-05-27 MED ORDER — OXYCODONE-ACETAMINOPHEN 5-325 MG PO TABS
2.0000 | ORAL_TABLET | Freq: Once | ORAL | Status: AC
  Administered 2013-05-27: 2 via ORAL
  Filled 2013-05-27: qty 2

## 2013-05-27 MED ORDER — OXYCODONE-ACETAMINOPHEN 5-325 MG PO TABS: ORAL_TABLET | ORAL | Status: DC

## 2013-05-27 NOTE — ED Notes (Signed)
PA Erin at bedside  

## 2013-05-27 NOTE — ED Notes (Signed)
Pt instructed to put gown on.

## 2013-05-27 NOTE — ED Notes (Addendum)
Pt reports intermittent sharp cp x 3 days, "just comes and goes." Pain increased with inspiration and is mostly under R breast. reports she became concerned today when the tips of all her fingers felt numb and her thighs felt numb and tingly. A&ox4, resp e/u

## 2013-05-27 NOTE — ED Notes (Signed)
Pt returned she went to bathroom

## 2013-05-27 NOTE — ED Notes (Signed)
Pt c/o CP intermittent pain to right breast X a few weeks. Pt more concerned about bilateral leg pain that starts in upper posterior thigh and radiates into lower thigh, sts the pain hasn't gone away and sometimes experiences a numbness feeling with it, reports this started about a week ago. Pt sts has had a pinched nerve in back before. Pt in nad, skin warm and dry, resp e/u. Pt sts she has been drinking a lot of energy pills and drinks lately and thinks that is why she is getting CP.

## 2013-05-27 NOTE — ED Notes (Signed)
Pt returned from radiology.

## 2013-05-27 NOTE — ED Notes (Signed)
Unable to locate pt x1

## 2013-05-27 NOTE — ED Provider Notes (Signed)
CSN: [id number]     Arrival date & time 05/27/13  1342 History   First MD Initiated Contact with Patient 05/27/13 1654     Chief Complaint  Patient presents with  . Chest Pain   (Consider location/radiation/quality/duration/timing/severity/associated sxs/prior Treatment) HPI Pt is a 52yo morbidly obese female c/o bilateral hand numbness and lateral thigh numbness, tingling, and pain.  Also mentioned intermittent sharp right chest pain under right breast that last only a few seconds at a time, occasionally worse with inspiration.  Pt states she is more concerned about numbness in hands and thighs.  Hand numbness started about 1 week ago, tingling in tips of her fingers w/o pain.  Pt also reports occasionally dropping stuff from her right hand over the last week due to weakness in hand. Does report falling on black ice a few years ago requiring chiropractic treatment and another fall while tripping on some steps a few months ago but no other recent injuries.  Pt concerned she may have a pinched nerve or something.    Past Medical History  Diagnosis Date  . Hypertension   . Hypercholesteremia   . Migraine    Past Surgical History  Procedure Laterality Date  . Abdominal hysterectomy     Family History  Problem Relation Age of Onset  . Alcohol abuse Mother   . Diabetes Mother   . Hyperlipidemia Mother   . Hypertension Mother   . Diabetes Brother   . Hyperlipidemia Brother   . Hypertension Brother    History  Substance Use Topics  . Smoking status: Former Games developer  . Smokeless tobacco: Former Neurosurgeon    Quit date: 03/11/1989  . Alcohol Use: No   OB History   Grav Para Term Preterm Abortions TAB SAB Ect Mult Living                 Review of Systems  Constitutional: Negative for fever, chills, diaphoresis and fatigue.  Respiratory: Negative for cough and shortness of breath.   Cardiovascular: Positive for chest pain. Negative for palpitations and leg swelling.  Musculoskeletal:  Positive for myalgias, back pain and arthralgias. Negative for joint swelling and gait problem.  Neurological: Positive for weakness and numbness. Negative for dizziness, tremors, seizures, syncope, facial asymmetry, speech difficulty, light-headedness and headaches.  All other systems reviewed and are negative.    Allergies  Ciprofloxacin; Hydrocodone; and Hydrocortisone  Home Medications   Current Outpatient Rx  Name  Route  Sig  Dispense  Refill  . atorvastatin (LIPITOR) 10 MG tablet   Oral   Take 10 mg by mouth at bedtime.         . benazepril (LOTENSIN) 20 MG tablet   Oral   Take 20 mg by mouth at bedtime.         . Cyanocobalamin (VITAMIN B-12 SL)   Sublingual   Place 1 tablet under the tongue daily.         . Misc Natural Products (SUPER ENERGY PO)   Oral   Take 1 capsule by mouth daily.         . naproxen sodium (ANAPROX) 220 MG tablet   Oral   Take 440-880 mg by mouth 2 (two) times daily as needed (pain).         . cyclobenzaprine (FLEXERIL) 10 MG tablet   Oral   Take 1 tablet (10 mg total) by mouth 2 (two) times daily as needed for muscle spasms.   15 tablet   0   .  ibuprofen (ADVIL,MOTRIN) 600 MG tablet   Oral   Take 1 tablet (600 mg total) by mouth every 6 (six) hours as needed for pain.   30 tablet   0   . oxyCODONE-acetaminophen (PERCOCET/ROXICET) 5-325 MG per tablet      Take 1-2 pills every 4-6 hours as needed for pain.   10 tablet   0    BP 142/86  Pulse 55  Temp(Src) 98.3 F (36.8 C) (Oral)  Resp 18  Ht 5\' 4"  (1.626 m)  Wt 292 lb 12.8 oz (132.813 kg)  BMI 50.23 kg/m2  SpO2 98% Physical Exam  Nursing note and vitals reviewed. Constitutional: She is oriented to person, place, and time. She appears well-developed and well-nourished. No distress.  Morbidly obese female sitting on edge of exam bed. NAD.  HENT:  Head: Normocephalic and atraumatic.  Eyes: Conjunctivae and EOM are normal. Pupils are equal, round, and reactive to  light. Right eye exhibits no discharge. Left eye exhibits no discharge. No scleral icterus.  Neck: Normal range of motion. Neck supple.  FROM. TTP over cervical spine and paraspinal muscles. No step offs or crepitus.   Cardiovascular: Normal rate, regular rhythm and normal heart sounds.   Pulmonary/Chest: Effort normal and breath sounds normal. No respiratory distress. She has no wheezes. She has no rales. She exhibits no tenderness.  Abdominal: Soft. Bowel sounds are normal. She exhibits no distension and no mass. There is no tenderness. There is no rebound and no guarding.  Musculoskeletal: Normal range of motion. She exhibits tenderness. She exhibits no edema.       Legs: TTP lateral right and left thigh. R>L.   Neurological: She is alert and oriented to person, place, and time. No cranial nerve deficit. Coordination normal. GCS eye subscore is 4. GCS verbal subscore is 5. GCS motor subscore is 6.  CN II-XII in tact. Equal grip strength. Nl sensation to light touch.  Antalgic gait.  Skin: Skin is warm and dry. She is not diaphoretic.    ED Course  Procedures (including critical care time) Labs Review Labs Reviewed  BASIC METABOLIC PANEL - Abnormal; Notable for the following:    GFR calc non Af Amer 81 (*)    All other components within normal limits  CBC  POCT I-STAT TROPONIN I   Imaging Review Dg Chest 2 View  05/27/2013   *RADIOLOGY REPORT*  Clinical Data: Chest pain, initial encounter.  CHEST - 2 VIEW  Comparison: 11/12/2009; chest CT - 07/06/2011  Findings: Grossly unchanged borderline enlarged cardiac silhouette and mediastinal contours.  No focal parenchymal opacities.  No pleural effusion or pneumothorax.  No evidence of edema.  Grossly unchanged bones.  IMPRESSION: Borderline enlarged cardiac silhouette without acute cardiopulmonary disease.   Original Report Authenticated By: Tacey Ruiz, MD   Dg Lumbar Spine Complete  05/27/2013   CLINICAL DATA:  Sudden onset of bilateral  leg pain, no injury  EXAM: LUMBAR SPINE - COMPLETE 4+ VIEW  COMPARISON:  CT chest abdomen and pelvis 07/06/2011  FINDINGS: Frontal, lateral and bilateral oblique radiographs of the lumbar spine demonstrate no acute fracture or malalignment. Vertebral body heights are maintained. Focal degenerative disc disease is noted at L5-S1. Facet arthropathy is noted at L4-L5 and L5-S1. No spondylolysis or listhesis. Bony mineralization appears within normal limits. Visualized bowel gas pattern is unremarkable. The visualized bony pelvis appears intact. Multiple small vascular phleboliths project over the pelvis.  IMPRESSION: 1. No acute osseous abnormality.  2.  Lower lumbar degenerative  disc disease and facet arthropathy.   Electronically Signed   By: Malachy Moan M.D.   On: 05/27/2013 18:55   Ct Head Wo Contrast  05/27/2013   CLINICAL DATA:  Chest pain, extremity tingling and weakness  EXAM: CT HEAD WITHOUT CONTRAST  CT CERVICAL SPINE WITHOUT CONTRAST  TECHNIQUE: Multidetector CT imaging of the head and cervical spine was performed following the standard protocol without intravenous contrast. Multiplanar CT image reconstructions of the cervical spine were also generated.  COMPARISON:  Prior CT head and cervical spine 07/06/2011  FINDINGS: CT HEAD FINDINGS  Insert nigh ka the globes and orbits are symmetric and unremarkable bilaterally. No focal soft tissue or calvarial abnormality. Atherosclerotic calcifications noted in the cavernous carotid arteries bilaterally. Normal aeration of the mastoid air cells opinions paranasal sinuses safe for some mild mucoperiosteal thickening in the bilateral frontal sinuses and frontoethmoidal recesses.  CT CERVICAL SPINE FINDINGS  No acute fracture, malalignment or prevertebral soft tissue swelling. Mild multilevel cervical spondylosis without focality. Early facet arthropathy. Straightening of the normal cervical lordosis may be degenerative or related to muscle spasm. Unremarkable  CT appearance of the thyroid gland. No acute soft tissue abnormality. The lung apices are unremarkable.  IMPRESSION: CT HEAD IMPRESSION  1. No acute intracranial abnormality 2. Mild intracranial atherosclerotic vascular calcifications.  CT CERVICAL SPINE IMPRESSION  1. No acute fracture or malalignment 2. Mild multilevel cervical spondylosis in early facet arthropathy. 3. Straightening of the normal cervical lordosis may be degenerative or related to muscle spasm.   Electronically Signed   By: Malachy Moan M.D.   On: 05/27/2013 20:41   Ct Cervical Spine Wo Contrast  05/27/2013   CLINICAL DATA:  Chest pain, extremity tingling and weakness  EXAM: CT HEAD WITHOUT CONTRAST  CT CERVICAL SPINE WITHOUT CONTRAST  TECHNIQUE: Multidetector CT imaging of the head and cervical spine was performed following the standard protocol without intravenous contrast. Multiplanar CT image reconstructions of the cervical spine were also generated.  COMPARISON:  Prior CT head and cervical spine 07/06/2011  FINDINGS: CT HEAD FINDINGS  Insert nigh ka the globes and orbits are symmetric and unremarkable bilaterally. No focal soft tissue or calvarial abnormality. Atherosclerotic calcifications noted in the cavernous carotid arteries bilaterally. Normal aeration of the mastoid air cells opinions paranasal sinuses safe for some mild mucoperiosteal thickening in the bilateral frontal sinuses and frontoethmoidal recesses.  CT CERVICAL SPINE FINDINGS  No acute fracture, malalignment or prevertebral soft tissue swelling. Mild multilevel cervical spondylosis without focality. Early facet arthropathy. Straightening of the normal cervical lordosis may be degenerative or related to muscle spasm. Unremarkable CT appearance of the thyroid gland. No acute soft tissue abnormality. The lung apices are unremarkable.  IMPRESSION: CT HEAD IMPRESSION  1. No acute intracranial abnormality 2. Mild intracranial atherosclerotic vascular calcifications.  CT  CERVICAL SPINE IMPRESSION  1. No acute fracture or malalignment 2. Mild multilevel cervical spondylosis in early facet arthropathy. 3. Straightening of the normal cervical lordosis may be degenerative or related to muscle spasm.   Electronically Signed   By: Malachy Moan M.D.   On: 05/27/2013 20:41    MDM   1. Bilateral hand numbness   2. Bilateral sciatica   3. Chest pain   4. Degenerative disc disease    Pt c/o bilateral hand numbness and bilateral thigh numbness with tingling and pain.  Also mentioned chest pain but pt less concerned with CP.    Cardiac workup-unremarkable.  Discussed reported right handed weakness with Dr. Rubin Payor.  CT head and neck ordered. Plain films Lumbar spine.  CT head-no acute intracranial abnormality.  Mild intracranial atherosclerotic vascular calcifications.  CT neck- chronic changes found, evidence of possible muscle spasm Lumbar spine- evidence of degenerative disc disease.  Discussed with Dr. Rubin Payor, will discharge pt with Neurology f/u.  All labs/imaging/findings discussed with patient. All questions answered and concerns addressed. Will discharge pt home and have pt f/u with her PCP and Dr. Roseanne Reno, neurology. Return precautions given. Pt verbalized understanding and agreement with tx plan. Vitals: unremarkable. Discharged in stable condition.    Discussed pt with attending during ED encounter and agrees with plan.   Junius Finner, PA-C 05/28/13 [phone]

## 2013-05-30 NOTE — ED Provider Notes (Signed)
Medical screening examination/treatment/procedure(s) were performed by non-physician practitioner and as supervising physician I was immediately available for consultation/collaboration.  Gwendolin Briel R. Lakeyshia Tuckerman, MD 05/30/13 1018 

## 2013-12-25 ENCOUNTER — Other Ambulatory Visit: Payer: Self-pay | Admitting: Family Medicine

## 2013-12-25 DIAGNOSIS — N63 Unspecified lump in unspecified breast: Secondary | ICD-10-CM

## 2014-01-06 ENCOUNTER — Ambulatory Visit
Admission: RE | Admit: 2014-01-06 | Discharge: 2014-01-06 | Disposition: A | Discharge status: Home / Self Care | Payer: 59 | Source: Ambulatory Visit | Attending: Family Medicine | Admitting: Family Medicine

## 2014-01-06 DIAGNOSIS — N63 Unspecified lump in unspecified breast: Secondary | ICD-10-CM

## 2014-05-19 ENCOUNTER — Emergency Department (HOSPITAL_COMMUNITY)
Admission: EM | Admit: 2014-05-19 | Discharge: 2014-05-19 | Disposition: A | Discharge status: Home / Self Care | Payer: 59 | Attending: Emergency Medicine | Admitting: Emergency Medicine

## 2014-05-19 ENCOUNTER — Encounter (HOSPITAL_COMMUNITY): Payer: Self-pay | Admitting: Emergency Medicine

## 2014-05-19 DIAGNOSIS — Z87891 Personal history of nicotine dependence: Secondary | ICD-10-CM | POA: Insufficient documentation

## 2014-05-19 DIAGNOSIS — Z8639 Personal history of other endocrine, nutritional and metabolic disease: Secondary | ICD-10-CM | POA: Insufficient documentation

## 2014-05-19 DIAGNOSIS — Z862 Personal history of diseases of the blood and blood-forming organs and certain disorders involving the immune mechanism: Secondary | ICD-10-CM | POA: Diagnosis not present

## 2014-05-19 DIAGNOSIS — Z79899 Other long term (current) drug therapy: Secondary | ICD-10-CM | POA: Diagnosis not present

## 2014-05-19 DIAGNOSIS — I10 Essential (primary) hypertension: Secondary | ICD-10-CM | POA: Diagnosis not present

## 2014-05-19 DIAGNOSIS — M25569 Pain in unspecified knee: Secondary | ICD-10-CM | POA: Insufficient documentation

## 2014-05-19 DIAGNOSIS — E669 Obesity, unspecified: Secondary | ICD-10-CM | POA: Insufficient documentation

## 2014-05-19 DIAGNOSIS — M25579 Pain in unspecified ankle and joints of unspecified foot: Secondary | ICD-10-CM | POA: Insufficient documentation

## 2014-05-19 DIAGNOSIS — M25571 Pain in right ankle and joints of right foot: Secondary | ICD-10-CM

## 2014-05-19 DIAGNOSIS — M25561 Pain in right knee: Secondary | ICD-10-CM

## 2014-05-19 MED ORDER — IBUPROFEN 800 MG PO TABS: 800.0000 mg | ORAL_TABLET | Freq: Three times a day (TID) | ORAL | Status: DC

## 2014-05-19 MED ORDER — HYDROCODONE-ACETAMINOPHEN 5-325 MG PO TABS: 1.0000 | ORAL_TABLET | Freq: Four times a day (QID) | ORAL | Status: DC | PRN

## 2014-05-19 NOTE — Discharge Instructions (Signed)
Call a orthopedic surgeon for further evaluation of your knee pain and ankle pain. Call for a follow up appointment with a Family or Primary Care Provider.  Return if Symptoms worsen.   Take medication as prescribed.  Ice and elevate your knee and ankle but her heart 3-4 times a day. Do not operate heavy machinery, drink alcohol while taking narcotic pain medication.

## 2014-05-19 NOTE — ED Notes (Signed)
Pt is an obese female who just started a new job where she walks continuously on a concrete floor. Started last pm with right foot and knee pain.

## 2014-05-19 NOTE — ED Provider Notes (Signed)
CSN: [id number]     Arrival date & time 05/19/14  9381 History  This chart was scribed for Threasa Beards, MD by Zola Button, ED Scribe. This patient was seen in room TR06C/TR06C and the patient's care was started at 10:51 AM.     No chief complaint on file.     HPI Comments: Kelsey Santana is a 53 y.o. female who presents to the Emergency Department complaining of gradual onset, constant right knee pain and right ankle pain, worsening last night. Patient says she feels a sharp pain when she turns it a certain way, and says that the pain is worse with movement. She has tried 4 doses of Aleve this morning, Advil and Ibuprofen last night without any relief to symptoms. She was walking on a lot of concrete yesterday at work, going up and down stairs. She reports wearing shoes at yesterday with out condition, not normal work shoes. Denies feeling of weak joint or if the knee is going to give out on her. Patient says she has had problems with her right heel before, but was never diagnosed with anything. Patient denies injury, fall, and hx of DM.    The history is provided by the patient. No language interpreter was used.       Past Medical History  Diagnosis Date  . Hypertension   . Hypercholesteremia   . Migraine    Past Surgical History  Procedure Laterality Date  . Abdominal hysterectomy     Family History  Problem Relation Age of Onset  . Alcohol abuse Mother   . Diabetes Mother   . Hyperlipidemia Mother   . Hypertension Mother   . Diabetes Brother   . Hyperlipidemia Brother   . Hypertension Brother    History  Substance Use Topics  . Smoking status: Former Research scientist (life sciences)  . Smokeless tobacco: Former Systems developer    Quit date: 03/11/1989  . Alcohol Use: No   OB History   Grav Para Term Preterm Abortions TAB SAB Ect Mult Living                 Review of Systems  Constitutional: Negative for fever and chills.  Cardiovascular: Negative for leg swelling.  Musculoskeletal: Positive  for arthralgias and myalgias. Negative for back pain and joint swelling.  Skin: Negative for wound.  Neurological: Negative for weakness and numbness.  All other systems reviewed and are negative.     Allergies  Ciprofloxacin; Hydrocodone; and Hydrocortisone  Home Medications   Prior to Admission medications   Medication Sig Start Date End Date Taking? Authorizing Provider  naproxen sodium (ANAPROX) 220 MG tablet Take 440-880 mg by mouth 2 (two) times daily as needed (pain).   Yes Historical Provider, MD  Cyanocobalamin (VITAMIN B-12 SL) Place 1 tablet under the tongue daily.    Historical Provider, MD  ibuprofen (ADVIL,MOTRIN) 600 MG tablet Take 1 tablet (600 mg total) by mouth every 6 (six) hours as needed for pain. 05/27/13   Noland Fordyce, PA-C  Misc Natural Products (SUPER ENERGY PO) Take 1 capsule by mouth daily.    Historical Provider, MD   BP 150/79  Pulse 69  Temp(Src) 97.8 F (36.6 C)  SpO2 100% Physical Exam  Nursing note and vitals reviewed. Constitutional: She is oriented to person, place, and time. She appears well-developed and well-nourished. No distress.  Obese female  HENT:  Head: Normocephalic and atraumatic.  Mouth/Throat: Oropharynx is clear and moist.  Eyes: Pupils are equal, round, and reactive to  light.  Neck: Neck supple.  Cardiovascular:  Pulses:      Radial pulses are 2+ on the right side, and 2+ on the left side.  Pulmonary/Chest: Effort normal.  Musculoskeletal:       Right knee: She exhibits normal range of motion, no swelling and no deformity. Tenderness found. Medial joint line tenderness noted.       Right ankle: She exhibits normal range of motion, no swelling, no ecchymosis and no deformity. Tenderness.       Feet:  No calf tenderness, or lower extremity swelling.  Neurological: She is alert and oriented to person, place, and time.  Skin: Skin is warm and dry. She is not diaphoretic.  Psychiatric: She has a normal mood and affect. Her  behavior is normal.    ED Course  Procedures  10:59 AM-Discussed treatment plan which includes anti-inflammatories, Norco with pt at bedside and pt agreed to plan.   Labs Review Labs Reviewed - No data to display  Imaging Review No results found.   EKG Interpretation None      MDM   Final diagnoses:  Right knee pain  Right ankle pain   Patient presents with nontraumatic right knee and ongoing right ankle pain, likely plantar fasciitis.  Knee likely meniscal, knee sleeve, NSAIDs, ortho follow up.  Discussed treatment plan with the patient. Return precautions given. Reports understanding and no other concerns at this time.  Patient is stable for discharge at this time. Meds given in ED:  Medications - No data to display  Discharge Medication List as of 05/19/2014 11:08 AM    START taking these medications   Details  HYDROcodone-acetaminophen (NORCO/VICODIN) 5-325 MG per tablet Take 1 tablet by mouth every 6 (six) hours as needed for moderate pain or severe pain., Starting 05/19/2014, Until Discontinued, Print    ibuprofen (ADVIL,MOTRIN) 800 MG tablet Take 1 tablet (800 mg total) by mouth 3 (three) times daily with meals., Starting 05/19/2014, Until Discontinued, Print      I personally performed the services described in this documentation, which was scribed in my presence. The recorded information has been reviewed and is accurate.      Harvie Heck, PA-C 05/19/14 [place], PA-C 05/19/14 1704

## 2014-05-20 NOTE — ED Provider Notes (Signed)
Medical screening examination/treatment/procedure(s) were performed by non-physician practitioner and as supervising physician I was immediately available for consultation/collaboration.   EKG Interpretation None       Threasa Beards, MD 05/20/14 2238

## 2014-07-15 ENCOUNTER — Encounter (HOSPITAL_COMMUNITY): Payer: Self-pay | Admitting: Emergency Medicine

## 2014-07-15 ENCOUNTER — Emergency Department (INDEPENDENT_AMBULATORY_CARE_PROVIDER_SITE_OTHER)
Admission: EM | Admit: 2014-07-15 | Discharge: 2014-07-15 | Disposition: A | Discharge status: Home / Self Care | Payer: Self-pay | Source: Home / Self Care | Attending: Emergency Medicine | Admitting: Emergency Medicine

## 2014-07-15 ENCOUNTER — Ambulatory Visit (HOSPITAL_COMMUNITY): Payer: Self-pay | Attending: Emergency Medicine

## 2014-07-15 DIAGNOSIS — J111 Influenza due to unidentified influenza virus with other respiratory manifestations: Secondary | ICD-10-CM

## 2014-07-15 DIAGNOSIS — E785 Hyperlipidemia, unspecified: Secondary | ICD-10-CM

## 2014-07-15 DIAGNOSIS — R69 Illness, unspecified: Principal | ICD-10-CM

## 2014-07-15 DIAGNOSIS — R0602 Shortness of breath: Secondary | ICD-10-CM | POA: Insufficient documentation

## 2014-07-15 DIAGNOSIS — R05 Cough: Secondary | ICD-10-CM

## 2014-07-15 DIAGNOSIS — I10 Essential (primary) hypertension: Secondary | ICD-10-CM

## 2014-07-15 DIAGNOSIS — R059 Cough, unspecified: Secondary | ICD-10-CM

## 2014-07-15 DIAGNOSIS — R079 Chest pain, unspecified: Secondary | ICD-10-CM | POA: Insufficient documentation

## 2014-07-15 LAB — POCT RAPID STREP A: Streptococcus, Group A Screen (Direct): NEGATIVE

## 2014-07-15 MED ORDER — BENAZEPRIL HCL 20 MG PO TABS: 20.0000 mg | ORAL_TABLET | Freq: Every day | ORAL | Status: DC

## 2014-07-15 MED ORDER — BUPROPION HCL ER (XL) 150 MG PO TB24: 150.0000 mg | ORAL_TABLET | Freq: Every day | ORAL | Status: DC

## 2014-07-15 MED ORDER — ATORVASTATIN CALCIUM 10 MG PO TABS: 10.0000 mg | ORAL_TABLET | Freq: Every day | ORAL | Status: DC

## 2014-07-15 NOTE — Discharge Instructions (Signed)
You have a flu like illness. Drink plenty of fluids. Use tylenol and motrin as needed for fever, bodyaches, headache. Get plenty of rest.  Follow up as needed.

## 2014-07-15 NOTE — ED Provider Notes (Signed)
CSN: [id number]     Arrival date & time 07/15/14  1017 History   First MD Initiated Contact with Patient 07/15/14 [phone]     Chief Complaint  Patient presents with  . URI   (Consider location/radiation/quality/duration/timing/severity/associated sxs/prior Treatment) HPI She is a 53 year old woman here for evaluation of fever and body aches. Her symptoms started on Saturday with headache, body aches, fever to 101, stuffy nose, sore throat, fatigue. She is also had some nausea and diarrhea. She reports mid abdominal pain. Starting yesterday, she has developed more of a cough and some mild shortness of breath. She's been taking over-the-counter agents without much improvement.  Past Medical History  Diagnosis Date  . Hypertension   . Hypercholesteremia   . Migraine    Past Surgical History  Procedure Laterality Date  . Abdominal hysterectomy     Family History  Problem Relation Age of Onset  . Alcohol abuse Mother   . Diabetes Mother   . Hyperlipidemia Mother   . Hypertension Mother   . Diabetes Brother   . Hyperlipidemia Brother   . Hypertension Brother    History  Substance Use Topics  . Smoking status: Former Research scientist (life sciences)  . Smokeless tobacco: Former Systems developer    Quit date: 03/11/1989  . Alcohol Use: No   OB History    No data available     Review of Systems  Constitutional: Negative for fever and fatigue.  HENT: Positive for congestion, ear pain and sore throat. Negative for rhinorrhea.   Respiratory: Positive for cough and shortness of breath.   Gastrointestinal: Positive for nausea, abdominal pain and diarrhea. Negative for vomiting.  Musculoskeletal: Positive for myalgias.  Neurological: Positive for headaches.    Allergies  Ciprofloxacin; Hydrocodone; and Hydrocortisone  Home Medications   Prior to Admission medications   Medication Sig Start Date End Date Taking? Authorizing Provider  atorvastatin (LIPITOR) 10 MG tablet Take 1 tablet (10 mg total) by mouth daily.  07/15/14   Melony Overly, MD  benazepril (LOTENSIN) 20 MG tablet Take 1 tablet (20 mg total) by mouth daily. 07/15/14   Melony Overly, MD  buPROPion (WELLBUTRIN XL) 150 MG 24 hr tablet Take 1 tablet (150 mg total) by mouth daily. 07/15/14   Melony Overly, MD  Cyanocobalamin (VITAMIN B-12 SL) Place 1 tablet under the tongue daily.    Historical Provider, MD  HYDROcodone-acetaminophen (NORCO/VICODIN) 5-325 MG per tablet Take 1 tablet by mouth every 6 (six) hours as needed for moderate pain or severe pain. 05/19/14   Harvie Heck, PA-C  ibuprofen (ADVIL,MOTRIN) 800 MG tablet Take 1 tablet (800 mg total) by mouth 3 (three) times daily with meals. 05/19/14   Harvie Heck, PA-C  Multiple Vitamins-Minerals (MULTIVITAMIN WITH MINERALS) tablet Take 1 tablet by mouth daily.    Historical Provider, MD   BP 155/83 mmHg  Pulse 56  Temp(Src) 98.2 F (36.8 C) (Oral)  Resp 20  SpO2 100% Physical Exam  Constitutional: She is oriented to person, place, and time. She appears well-developed and well-nourished. She appears distressed (looks ill).  HENT:  Head: Normocephalic and atraumatic.  Right Ear: Tympanic membrane and external ear normal.  Left Ear: Tympanic membrane and external ear normal.  Nose: Mucosal edema present. No rhinorrhea.  Mouth/Throat: Mucous membranes are not dry. Posterior oropharyngeal erythema present. No oropharyngeal exudate.  Neck: Neck supple.  Cardiovascular: Normal rate, regular rhythm and normal heart sounds.   No murmur heard. Pulmonary/Chest: Effort normal and breath sounds normal. No respiratory distress.  She has no wheezes. She has no rales.  Abdominal: Soft. Bowel sounds are normal. She exhibits no distension. There is tenderness (periumbilical).  Lymphadenopathy:    She has cervical adenopathy (submandibular).  Neurological: She is alert and oriented to person, place, and time.    ED Course  Procedures (including critical care time) Labs Review Labs Reviewed  POCT  RAPID STREP A ([place])    Imaging Review Dg Chest 2 View  07/15/2014   CLINICAL DATA:  Shortness of breath and right-sided chest pain for 4 days, initial encounter  EXAM: CHEST  2 VIEW  COMPARISON:  05/27/2013  FINDINGS: The heart size and mediastinal contours are within normal limits. Both lungs are clear. The visualized skeletal structures are unremarkable.  IMPRESSION: No active cardiopulmonary disease.   Electronically Signed   By: Inez Catalina M.D.   On: 07/15/2014 11:17     MDM   1. Influenza-like illness   2. Cough   3. Shortness of breath    Chest x-ray and rapid strep are negative. She has the flu or a flulike illness. She is outside the treatment window for Tamiflu. Symptomatically care with rest, fluids, Tylenol/Motrin. Follow-up as needed.  I also provided 3 months worth of prescriptions for Wellbutrin which she takes for headaches, benazepril for hypertension, Lipitor for hypercholesterolemia. She is currently in the process of finding a new primary care provider after her old one retired.    Melony Overly, MD 07/15/14 1130

## 2014-07-15 NOTE — ED Notes (Signed)
53 year old female with complaints Flu like symptoms since Sunday 07/12/14.  Headaches, body aches, sore throat, ears, upset stomach,

## 2014-07-18 LAB — CULTURE, GROUP A STREP

## 2015-02-03 ENCOUNTER — Ambulatory Visit: Payer: Self-pay | Attending: Internal Medicine

## 2015-02-05 ENCOUNTER — Ambulatory Visit: Payer: Self-pay

## 2015-03-08 ENCOUNTER — Ambulatory Visit: Payer: Self-pay

## 2015-04-06 ENCOUNTER — Ambulatory Visit: Payer: Self-pay

## 2015-06-06 ENCOUNTER — Emergency Department (HOSPITAL_COMMUNITY)
Admission: EM | Admit: 2015-06-06 | Discharge: 2015-06-07 | Disposition: A | Discharge status: Home / Self Care | Payer: No Typology Code available for payment source | Attending: Emergency Medicine | Admitting: Emergency Medicine

## 2015-06-06 ENCOUNTER — Encounter (HOSPITAL_COMMUNITY): Payer: Self-pay

## 2015-06-06 ENCOUNTER — Emergency Department (HOSPITAL_COMMUNITY): Payer: No Typology Code available for payment source

## 2015-06-06 DIAGNOSIS — Y999 Unspecified external cause status: Secondary | ICD-10-CM | POA: Insufficient documentation

## 2015-06-06 DIAGNOSIS — Z791 Long term (current) use of non-steroidal anti-inflammatories (NSAID): Secondary | ICD-10-CM | POA: Insufficient documentation

## 2015-06-06 DIAGNOSIS — Z87891 Personal history of nicotine dependence: Secondary | ICD-10-CM | POA: Insufficient documentation

## 2015-06-06 DIAGNOSIS — E7800 Pure hypercholesterolemia, unspecified: Secondary | ICD-10-CM | POA: Insufficient documentation

## 2015-06-06 DIAGNOSIS — S79912A Unspecified injury of left hip, initial encounter: Secondary | ICD-10-CM | POA: Insufficient documentation

## 2015-06-06 DIAGNOSIS — Y939 Activity, unspecified: Secondary | ICD-10-CM | POA: Diagnosis not present

## 2015-06-06 DIAGNOSIS — M25552 Pain in left hip: Secondary | ICD-10-CM

## 2015-06-06 DIAGNOSIS — Y92410 Unspecified street and highway as the place of occurrence of the external cause: Secondary | ICD-10-CM | POA: Diagnosis not present

## 2015-06-06 DIAGNOSIS — I10 Essential (primary) hypertension: Secondary | ICD-10-CM | POA: Diagnosis not present

## 2015-06-06 DIAGNOSIS — G43909 Migraine, unspecified, not intractable, without status migrainosus: Secondary | ICD-10-CM | POA: Insufficient documentation

## 2015-06-06 DIAGNOSIS — S4992XA Unspecified injury of left shoulder and upper arm, initial encounter: Secondary | ICD-10-CM | POA: Diagnosis not present

## 2015-06-06 DIAGNOSIS — Z79899 Other long term (current) drug therapy: Secondary | ICD-10-CM | POA: Diagnosis not present

## 2015-06-06 DIAGNOSIS — M25512 Pain in left shoulder: Secondary | ICD-10-CM
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
## 2015-06-06 NOTE — ED Notes (Signed)
Pt c/o head lt shoulder lt hip lt buttocks

## 2015-06-07 MED ORDER — ACETAMINOPHEN 500 MG PO TABS
1000.0000 mg | ORAL_TABLET | Freq: Once | ORAL | Status: AC
  Administered 2015-06-07: 1000 mg via ORAL
  Filled 2015-06-07: qty 2

## 2015-06-07 NOTE — Discharge Instructions (Signed)
Joint Pain  Joint pain can be caused by many things. The joint can be bruised, infected, weak from aging, or sore from exercise. The pain will probably go away if you follow your doctor's instructions for home care. If your joint pain continues, more tests may be needed to help find the cause of your condition.  HOME CARE  Watch your condition for any changes. Follow these instructions as told to lessen the pain that you are feeling:  · Take medicines only as told by your doctor.  · Rest the sore joint for as long as told by your doctor. If your doctor tells you to, raise (elevate) the painful joint above the level of your heart while you are sitting or lying down.  · Do not do things that cause pain or make the pain worse.  · If told, put ice on the painful area:    Put ice in a plastic bag.    Place a towel between your skin and the bag.    Leave the ice on for 20 minutes, 2-3 times per day.  · Wear an elastic bandage, splint, or sling as told by your doctor. Loosen the bandage or splint if your fingers or toes lose feeling (become numb) and tingle, or if they turn cold and blue.  · Begin exercising or stretching the joint as told by your doctor. Ask your doctor what types of exercise are safe for you.  · Keep all follow-up visits as told by your doctor. This is important.  GET HELP IF:  · Your pain gets worse and medicine does not help it.  · Your joint pain does not get better in 3 days.  · You have more bruising or swelling.  · You have a fever.  · You lose 10 pounds (4.5 kg) or more without trying.  GET HELP RIGHT AWAY IF:  · You are not able to move the joint.  · Your fingers or toes become numb or they turn cold and blue.     This information is not intended to replace advice given to you by your health care provider. Make sure you discuss any questions you have with your health care provider.     Document Released: 07/26/2009 Document Revised: 08/28/2014 Document Reviewed: 05/19/2014  Elsevier Interactive  Patient Education ©2016 [employer]vier Inc[employer]
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
## 2015-09-09 ENCOUNTER — Telehealth: Payer: Self-pay | Admitting: Internal Medicine

## 2015-09-09 NOTE — Telephone Encounter (Signed)
Received records from [facility] for appointment on 09/29/15 with Dr Debara Pickett.  Records given to [facility] (medical records) for Dr Lysbeth Penner schedule on 09/29/15. lp

## 2015-09-10 ENCOUNTER — Other Ambulatory Visit: Payer: Self-pay

## 2015-09-10 DIAGNOSIS — Z1231 Encounter for screening mammogram for malignant neoplasm of breast: Secondary | ICD-10-CM

## 2015-09-17 ENCOUNTER — Ambulatory Visit: Payer: Self-pay

## 2015-09-29 ENCOUNTER — Ambulatory Visit: Payer: Self-pay | Admitting: Internal Medicine

## 2015-09-29 ENCOUNTER — Ambulatory Visit: Payer: Self-pay | Admitting: Family

## 2015-09-30 ENCOUNTER — Encounter: Payer: Self-pay | Admitting: *Deleted

## 2015-10-01 ENCOUNTER — Ambulatory Visit: Payer: Self-pay

## 2015-10-06 ENCOUNTER — Ambulatory Visit: Payer: Self-pay

## 2015-10-13 ENCOUNTER — Ambulatory Visit
Admission: RE | Admit: 2015-10-13 | Discharge: 2015-10-13 | Disposition: A | Discharge status: Home / Self Care | Payer: BLUE CROSS/BLUE SHIELD | Source: Ambulatory Visit

## 2015-10-13 ENCOUNTER — Ambulatory Visit: Payer: Self-pay

## 2015-10-13 DIAGNOSIS — Z1231 Encounter for screening mammogram for malignant neoplasm of breast: Secondary | ICD-10-CM

## 2015-11-15 ENCOUNTER — Ambulatory Visit (INDEPENDENT_AMBULATORY_CARE_PROVIDER_SITE_OTHER): Payer: BLUE CROSS/BLUE SHIELD | Admitting: Internal Medicine

## 2015-11-15 ENCOUNTER — Encounter: Payer: Self-pay | Admitting: Internal Medicine

## 2015-11-15 VITALS — BP 130/90 | HR 62 | Ht 64.0 in | Wt 307.2 lb

## 2015-11-15 DIAGNOSIS — R5383 Other fatigue: Secondary | ICD-10-CM | POA: Diagnosis not present

## 2015-11-15 DIAGNOSIS — R079 Chest pain, unspecified: Secondary | ICD-10-CM | POA: Diagnosis not present

## 2015-11-15 DIAGNOSIS — R0683 Snoring: Secondary | ICD-10-CM

## 2015-11-15 DIAGNOSIS — R071 Chest pain on breathing: Secondary | ICD-10-CM

## 2015-11-15 DIAGNOSIS — G478 Other sleep disorders: Secondary | ICD-10-CM

## 2015-11-15 DIAGNOSIS — E663 Overweight: Secondary | ICD-10-CM

## 2015-11-15 DIAGNOSIS — E785 Hyperlipidemia, unspecified: Secondary | ICD-10-CM | POA: Diagnosis not present

## 2015-11-15 DIAGNOSIS — I493 Ventricular premature depolarization: Secondary | ICD-10-CM

## 2015-11-15 DIAGNOSIS — K219 Gastro-esophageal reflux disease without esophagitis: Secondary | ICD-10-CM

## 2015-11-15 DIAGNOSIS — I10 Essential (primary) hypertension: Secondary | ICD-10-CM

## 2015-11-15 MED ORDER — ATORVASTATIN CALCIUM 20 MG PO TABS: 20.0000 mg | ORAL_TABLET | Freq: Every day | ORAL | Status: DC

## 2015-11-15 MED ORDER — LOSARTAN POTASSIUM 50 MG PO TABS: 50.0000 mg | ORAL_TABLET | Freq: Every day | ORAL | Status: DC

## 2015-11-15 MED ORDER — PANTOPRAZOLE SODIUM 40 MG PO TBEC: 40.0000 mg | DELAYED_RELEASE_TABLET | Freq: Every day | ORAL | Status: DC

## 2015-11-15 NOTE — Progress Notes (Signed)
OFFICE NOTE  Chief Complaint:  Chest pain, fatigue  Primary Care Physician: Andria Frames, MD  HPI:  Kelsey Santana is a 55 y.o. female who is currently referred to me by Dr. Ronnald Ramp for evaluation of chest pain and fatigue. Kelsey Santana was previously seen by Dr. Felton Clinton, at [facility] in 2011. At that time she was having similar chest pain symptoms and underwent cardiac catheterization. This demonstrated mild one-vessel coronary artery disease with a 20% stenosis in the mid and distal LAD. Otherwise there was no significant obstructive coronary disease. She says she may have had a stress test prior to that and is not clear whether that was abnormal or perhaps false positive. Recently she's had significant weight gain. She works as a an inner in the evenings. She says that she generally works between 54 and 11 at night and then eats dinner very late and goes to sleep. She then sleeps very poorly at night and doesn't sleep much during the day. She said recently she's injured her foot which is decreased her activity ability. She is describing a chest pain with multiple associated symptoms. Sometimes it's pleuritic sometimes it's stabbing or sharp sometimes associated with shortness of breath and worse after laying down or eating, she reports some phlegm that she coughs up with a mild nonproductive cough as well as some reflux type symptoms. Blood pressure recently was elevated as she was not on medication. She was started on benazepril 20 mg daily. In addition she was started on atorvastatin statin 10 mg daily. Her PCP ordered lab work including but not limited to a [place] which shows normal renal function and normal TSH and normal lipase, as well as a lipid profile which was elevated and showing total cholesterol 226, triglycerides 173, HDL 40 and LDL 151. This is prior to starting atorvastatin.  PMHx:  Past Medical History  Diagnosis Date  . Hypertension   .  Hypercholesteremia   . Migraine     Past Surgical History  Procedure Laterality Date  . Abdominal hysterectomy      FAMHx:  Family History  Problem Relation Age of Onset  . Alcohol abuse Mother   . Diabetes Mother   . Hyperlipidemia Mother   . Hypertension Mother   . Diabetes Brother   . Hyperlipidemia Brother   . Hypertension Brother     SOCHx:   reports that she has quit smoking. She quit smokeless tobacco use about 26 years ago. She reports that she does not drink alcohol or use illicit drugs.  ALLERGIES:  Allergies  Allergen Reactions  . Ciprofloxacin     itching  . Hydrocodone     Nausea, itching  . Hydrocortisone Nausea Only    ROS: Pertinent items noted in HPI and remainder of comprehensive ROS otherwise negative.  HOME MEDS: Current Outpatient Prescriptions  Medication Sig Dispense Refill  . buPROPion (WELLBUTRIN XL) 150 MG 24 hr tablet Take 1 tablet (150 mg total) by mouth daily. 30 tablet 3  . atorvastatin (LIPITOR) 20 MG tablet Take 1 tablet (20 mg total) by mouth daily. 30 tablet 6  . losartan (COZAAR) 50 MG tablet Take 1 tablet (50 mg total) by mouth daily. 30 tablet 6  . pantoprazole (PROTONIX) 40 MG tablet Take 1 tablet (40 mg total) by mouth daily. 14 tablet 0   No current facility-administered medications for this visit.    LABS/IMAGING: No results found for this or any previous visit (from the past 48  hour(s)). No results found.  WEIGHTS: Wt Readings from Last 3 Encounters:  11/15/15 307 lb 3.2 oz (139.345 kg)  06/06/15 305 lb (138.347 kg)  05/27/13 292 lb 12.8 oz (132.813 kg)    VITALS: BP 130/90 mmHg  Pulse 62  Ht 5\' 4"  (1.626 m)  Wt 307 lb 3.2 oz (139.345 kg)  BMI 52.70 kg/m2  EXAM: General appearance: alert, no distress and morbidly obese Neck: no carotid bruit and no JVD Lungs: diminished breath sounds bibasilar Heart: regularly irregular rhythm Abdomen: soft, non-tender; bowel sounds normal; no masses,  no organomegaly and  morbidly obese Extremities: edema trace LLE edema Pulses: 2+ and symmetric Skin: Skin color, texture, turgor normal. No rashes or lesions Neurologic: Grossly normal Psych: Pleasant  EKG: Sinus rhythm with frequent PVCs in a trigeminal pattern at 62  ASSESSMENT: 1. Chest pain 2. Possible symptomatic palpitations 3. Hypertension 4. Dyslipidemia 5. Morbid obesity 6. GERD 7. Cough/possibly related to ACE inhibitor 8. Poor sleep/snoring/fatigue  PLAN: 1.   Kelsey Santana has a number of complaints today including chest pain which has some typical and atypical features. I'm somewhat reassured by heart catheterization in 2011 which showed only mild LAD disease, however she has numerous risk factors for coronary artery disease and I cannot exclude that based on her symptoms. I'm recommending a 2 day nuclear stress test. She understands that she may be at increased risk for artifact given her body habitus. Some of her symptoms sound like they may be reflux related. She does have a history of GERD in the past. I will prescribe protonic 40 mg daily for her to take for at least 2 weeks to see if there is an improvement in her symptoms. It also sounds that she may have cough related to ACE inhibitor. This started recently and she was put on enalapril. Will change that to losartan 50 mg daily. In addition she had a screening sleepiness score of 10, when combined with her snoring and fatigue and significant recent weight gain, this is concerning for obstructive sleep apnea. She has had a sleep study in the past which was negative however her weight at that time was about 180 pounds. Today it is 307 lbs.  Plan follow-up in a few weeks to discuss results of her studies. Thanks again for the kind referral.  Pixie Casino, MD, [facility] Attending Cardiologist [place] 11/15/2015, 10:18 AM

## 2015-11-15 NOTE — Patient Instructions (Addendum)
Your physician has recommended that you have a sleep study  [place]. This test records several body functions during sleep, including: brain activity, eye movement, oxygen and carbon dioxide blood levels, heart rate and rhythm, breathing rate and rhythm, the flow of air through your mouth and nose, snoring, body muscle movements, and chest and belly movement.  Your physician has requested that you have a lexiscan myoview 2 DAY PROTOCOL AT [address].Marland Kitchen For further information please visit [email or web address]. Please follow instruction sheet, as given.   STOP  BENAZEPRIL AND  ATORVASTATIN 10 MG  START  LOSARTAN 50 MG ONE TABLET DAILY ATORVASTATIN 20 MG ONE TABLET  AT BEDTIME DAILY PANTOPRAZOLE 40 MG ONE TABLET DAILY FOR 2 WEEKS THEN STOP.  Your physician recommends that you schedule a follow-up appointment AFTER MYOVIEW IS COMPLETED WITH DR HILTY.

## 2015-11-16 ENCOUNTER — Encounter: Payer: Self-pay | Admitting: *Deleted

## 2015-11-25 ENCOUNTER — Inpatient Hospital Stay (HOSPITAL_COMMUNITY): Admission: RE | Admit: 2015-11-25 | Payer: Self-pay | Source: Ambulatory Visit

## 2015-11-26 ENCOUNTER — Ambulatory Visit (HOSPITAL_COMMUNITY): Payer: Self-pay

## 2015-12-01 ENCOUNTER — Ambulatory Visit: Payer: Self-pay | Admitting: Internal Medicine

## 2015-12-06 ENCOUNTER — Ambulatory Visit: Payer: Self-pay | Admitting: Internal Medicine

## 2015-12-07 ENCOUNTER — Telehealth (HOSPITAL_COMMUNITY): Payer: Self-pay

## 2015-12-07 NOTE — Telephone Encounter (Signed)
Encounter complete. 

## 2015-12-08 ENCOUNTER — Ambulatory Visit (HOSPITAL_COMMUNITY): Payer: Self-pay

## 2015-12-09 ENCOUNTER — Ambulatory Visit (HOSPITAL_COMMUNITY)
Admission: RE | Admit: 2015-12-09 | Discharge: 2015-12-09 | Disposition: A | Discharge status: Home / Self Care | Payer: BLUE CROSS/BLUE SHIELD | Source: Ambulatory Visit | Attending: Cardiology | Admitting: Cardiology

## 2015-12-09 DIAGNOSIS — E669 Obesity, unspecified: Secondary | ICD-10-CM | POA: Diagnosis not present

## 2015-12-09 DIAGNOSIS — R0683 Snoring: Secondary | ICD-10-CM | POA: Diagnosis not present

## 2015-12-09 DIAGNOSIS — I10 Essential (primary) hypertension: Secondary | ICD-10-CM | POA: Insufficient documentation

## 2015-12-09 DIAGNOSIS — R002 Palpitations: Secondary | ICD-10-CM | POA: Insufficient documentation

## 2015-12-09 DIAGNOSIS — K219 Gastro-esophageal reflux disease without esophagitis: Secondary | ICD-10-CM

## 2015-12-09 DIAGNOSIS — R0602 Shortness of breath: Secondary | ICD-10-CM | POA: Diagnosis not present

## 2015-12-09 DIAGNOSIS — E785 Hyperlipidemia, unspecified: Secondary | ICD-10-CM | POA: Diagnosis not present

## 2015-12-09 DIAGNOSIS — Z87891 Personal history of nicotine dependence: Secondary | ICD-10-CM | POA: Diagnosis not present

## 2015-12-09 DIAGNOSIS — R079 Chest pain, unspecified: Secondary | ICD-10-CM | POA: Diagnosis present

## 2015-12-09 DIAGNOSIS — R5383 Other fatigue: Secondary | ICD-10-CM | POA: Diagnosis not present

## 2015-12-09 DIAGNOSIS — R42 Dizziness and giddiness: Secondary | ICD-10-CM | POA: Diagnosis not present

## 2015-12-09 DIAGNOSIS — Z8249 Family history of ischemic heart disease and other diseases of the circulatory system: Secondary | ICD-10-CM | POA: Diagnosis not present

## 2015-12-09 DIAGNOSIS — I493 Ventricular premature depolarization: Secondary | ICD-10-CM | POA: Diagnosis not present

## 2015-12-09 DIAGNOSIS — G478 Other sleep disorders: Secondary | ICD-10-CM | POA: Insufficient documentation

## 2015-12-09 DIAGNOSIS — Z6841 Body Mass Index (BMI) 40.0 and over, adult: Secondary | ICD-10-CM | POA: Diagnosis not present

## 2015-12-09 DIAGNOSIS — R0609 Other forms of dyspnea: Secondary | ICD-10-CM | POA: Diagnosis not present

## 2015-12-09 DIAGNOSIS — E663 Overweight: Secondary | ICD-10-CM

## 2015-12-09 MED ORDER — REGADENOSON 0.4 MG/5ML IV SOLN
0.4000 mg | Freq: Once | INTRAVENOUS | Status: AC
  Administered 2015-12-09: 0.4 mg via INTRAVENOUS

## 2015-12-09 MED ORDER — TECHNETIUM TC 99M SESTAMIBI GENERIC - CARDIOLITE
29.4000 | Freq: Once | INTRAVENOUS | Status: AC | PRN
  Administered 2015-12-09: 29.4 via INTRAVENOUS

## 2015-12-09 MED ORDER — AMINOPHYLLINE 25 MG/ML IV SOLN
75.0000 mg | Freq: Once | INTRAVENOUS | Status: AC
  Administered 2015-12-09: 75 mg via INTRAVENOUS

## 2015-12-10 ENCOUNTER — Ambulatory Visit (HOSPITAL_COMMUNITY)
Admission: RE | Admit: 2015-12-10 | Discharge: 2015-12-10 | Disposition: A | Discharge status: Home / Self Care | Payer: BLUE CROSS/BLUE SHIELD | Source: Ambulatory Visit | Attending: Cardiovascular Disease | Admitting: Cardiovascular Disease

## 2015-12-10 MED ORDER — TECHNETIUM TC 99M SESTAMIBI GENERIC - CARDIOLITE
28.1000 | Freq: Once | INTRAVENOUS | Status: AC | PRN
  Administered 2015-12-10: 28.1 via INTRAVENOUS

## 2015-12-13 LAB — MYOCARDIAL PERFUSION IMAGING
CHL CUP NUCLEAR SDS: 1
CHL CUP NUCLEAR SRS: 0
LV dias vol: 104 mL (ref 46–106)
LV sys vol: 37 mL
Peak HR: 95 {beats}/min
Rest HR: 55 {beats}/min
SSS: 1
TID: 0.94

## 2015-12-20 ENCOUNTER — Ambulatory Visit (INDEPENDENT_AMBULATORY_CARE_PROVIDER_SITE_OTHER): Payer: BLUE CROSS/BLUE SHIELD | Admitting: Internal Medicine

## 2015-12-20 ENCOUNTER — Encounter: Payer: Self-pay | Admitting: Internal Medicine

## 2015-12-20 VITALS — BP 110/82 | HR 68 | Ht 64.0 in | Wt 304.6 lb

## 2015-12-20 DIAGNOSIS — K219 Gastro-esophageal reflux disease without esophagitis: Secondary | ICD-10-CM | POA: Diagnosis not present

## 2015-12-20 DIAGNOSIS — R071 Chest pain on breathing: Secondary | ICD-10-CM | POA: Diagnosis not present

## 2015-12-20 DIAGNOSIS — E785 Hyperlipidemia, unspecified: Secondary | ICD-10-CM

## 2015-12-20 DIAGNOSIS — I10 Essential (primary) hypertension: Secondary | ICD-10-CM | POA: Diagnosis not present

## 2015-12-20 MED ORDER — PANTOPRAZOLE SODIUM 40 MG PO TBEC: 40.0000 mg | DELAYED_RELEASE_TABLET | Freq: Every day | ORAL | Status: DC

## 2015-12-20 MED ORDER — LOSARTAN POTASSIUM 50 MG PO TABS: 50.0000 mg | ORAL_TABLET | Freq: Every day | ORAL | Status: DC

## 2015-12-20 NOTE — Progress Notes (Signed)
OFFICE NOTE  Chief Complaint:  Feels much better, cough is resolved  Primary Care Physician: Andria Frames, MD  HPI:  Kelsey Santana is a 55 y.o. female who is currently referred to me by Dr. Ronnald Ramp for evaluation of chest pain and fatigue. Kelsey Santana was previously seen by Dr. Felton Clinton, at [facility] in 2011. At that time she was having similar chest pain symptoms and underwent cardiac catheterization. This demonstrated mild one-vessel coronary artery disease with a 20% stenosis in the mid and distal LAD. Otherwise there was no significant obstructive coronary disease. She says she may have had a stress test prior to that and is not clear whether that was abnormal or perhaps false positive. Recently she's had significant weight gain. 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 She then sleeps very poorly at night and doesn't sleep much during the day. She said recently she's injured her foot which is decreased her activity ability. She is describing a chest pain with multiple associated symptoms. Sometimes it's pleuritic sometimes it's stabbing or sharp sometimes associated with shortness of breath and worse after laying down or eating, she reports some phlegm that she coughs up with a mild nonproductive cough as well as some reflux type symptoms. Blood pressure recently was elevated as she was not on medication. She was started on benazepril 20 mg daily. In addition she was started on atorvastatin statin 10 mg daily. Her PCP ordered lab work including but not limited to a [place] which shows normal renal function and normal TSH and normal lipase, as well as a lipid profile which was elevated and showing total cholesterol 226, triglycerides 173, HDL 40 and LDL 151. This is prior to starting atorvastatin.  12/20/2015  Kelsey Santana returns today for follow-up. She  reports most of her chest pain symptoms have resolved and feels it's attributed to reflux. She is on proton next. I also switched her ACE inhibitor over to losartan. She has much better blood pressure today at 110/82. And as mentioned her cough is resolved completely. Fortunately her nuclear stress test was negative for ischemia with normal LV function. I'm still encouraging her to take a sleep study which is scheduled on 01/10/2016. She says that she's not sure she can make the appointment due to cost and not being able to be away from her work. I strongly urged her to make that appointment for her health consideration.  PMHx:  Past Medical History  Diagnosis Date  . Hypertension   . Hypercholesteremia   . Migraine     Past Surgical History  Procedure Laterality Date  . Abdominal hysterectomy      FAMHx:  Family History  Problem Relation Age of Onset  . Alcohol abuse Mother   . Diabetes Mother   . Hyperlipidemia Mother   . Hypertension Mother   . Diabetes Brother   . Hyperlipidemia Brother   . Hypertension Brother     SOCHx:   reports that she has quit smoking. She quit smokeless tobacco use about 26 years ago. She reports that she does not drink alcohol or use illicit drugs.  ALLERGIES:  Allergies  Allergen Reactions  . Ciprofloxacin     itching  . Hydrocodone     Nausea, itching  . Hydrocortisone Nausea Only    ROS: Pertinent items noted in HPI and  remainder of comprehensive ROS otherwise negative.  HOME MEDS: Current Outpatient Prescriptions  Medication Sig Dispense Refill  . atorvastatin (LIPITOR) 20 MG tablet Take 1 tablet (20 mg total) by mouth daily. 30 tablet 6  . buPROPion (WELLBUTRIN XL) 150 MG 24 hr tablet Take 1 tablet (150 mg total) by mouth daily. 30 tablet 3  . CALCIUM-MAGNESIUM-VITAMIN D PO Take 1 tablet by mouth daily.    Marland Kitchen losartan (COZAAR) 50 MG tablet Take 1 tablet (50 mg total) by mouth daily. 30 tablet 3  . Multiple Vitamins-Minerals (CENTRUM  ADULTS PO) Take 1 tablet by mouth daily.    . pantoprazole (PROTONIX) 40 MG tablet Take 1 tablet (40 mg total) by mouth daily. 30 tablet 3   No current facility-administered medications for this visit.    LABS/IMAGING: No results found for this or any previous visit (from the past 48 hour(s)). No results found.  WEIGHTS: Wt Readings from Last 3 Encounters:  12/20/15 304 lb 9.6 oz (138.166 kg)  12/09/15 307 lb (139.254 kg)  11/15/15 307 lb 3.2 oz (139.345 kg)    VITALS: BP 110/82 mmHg  Pulse 68  Ht 5\' 4"  (1.626 m)  Wt 304 lb 9.6 oz (138.166 kg)  BMI 52.26 kg/m2  EXAM: Deferred  EKG: Deferred  ASSESSMENT: 1. Chest pain - resolved, low risk stress test with normal LV function 2. Hypertension 3. Dyslipidemia 4. Morbid obesity 5. GERD 6. Cough/possibly related to ACE inhibitor 7. Poor sleep/snoring/fatigue  PLAN: 1.   Kelsey Santana has had improvement in her cough possibly related to ACE inhibitor and/or reflux. I've treated both and she feels much better. Her chest pain is also resolved. Her stress test was low risk. I would continue her current medications. I've encouraged her to keep her sleep study appointment. Follow-up with me as needed. Her primary care provider Dr. Quay Burow could prescribe her blood pressure medicines going forward. She does report some leg and muscle pains which could be related to a atorvastatin. I advised her she could take a 2 week holiday off the medication to see if it improves her symptoms and she may need to try another statin for primary prevention.  Follow-up as needed.  Pixie Casino, MD, [facility] Attending Cardiologist [place] 12/20/2015, 9:06 AM

## 2015-12-20 NOTE — Patient Instructions (Signed)
Your physician recommends that you schedule a follow-up appointment as needed  

## 2015-12-28 ENCOUNTER — Encounter (HOSPITAL_COMMUNITY): Payer: Self-pay | Admitting: Emergency Medicine

## 2015-12-28 ENCOUNTER — Ambulatory Visit (HOSPITAL_COMMUNITY)
Admission: EM | Admit: 2015-12-28 | Discharge: 2015-12-28 | Disposition: A | Discharge status: Home / Self Care | Payer: No Typology Code available for payment source | Attending: Family Medicine | Admitting: Family Medicine

## 2015-12-28 ENCOUNTER — Ambulatory Visit (INDEPENDENT_AMBULATORY_CARE_PROVIDER_SITE_OTHER): Payer: No Typology Code available for payment source

## 2015-12-28 DIAGNOSIS — M5442 Lumbago with sciatica, left side: Secondary | ICD-10-CM

## 2015-12-28 DIAGNOSIS — M542 Cervicalgia: Secondary | ICD-10-CM | POA: Diagnosis not present

## 2015-12-28 DIAGNOSIS — M5441 Lumbago with sciatica, right side: Secondary | ICD-10-CM

## 2015-12-28 MED ORDER — KETOROLAC TROMETHAMINE 60 MG/2ML IM SOLN
INTRAMUSCULAR | Status: AC
  Filled 2015-12-28: qty 2

## 2015-12-28 MED ORDER — CYCLOBENZAPRINE HCL 10 MG PO TABS: 10.0000 mg | ORAL_TABLET | Freq: Every day | ORAL | Status: DC

## 2015-12-28 MED ORDER — KETOROLAC TROMETHAMINE 60 MG/2ML IM SOLN
60.0000 mg | Freq: Once | INTRAMUSCULAR | Status: AC
  Administered 2015-12-28: 60 mg via INTRAMUSCULAR
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
## 2016-01-10 ENCOUNTER — Encounter (HOSPITAL_BASED_OUTPATIENT_CLINIC_OR_DEPARTMENT_OTHER): Payer: Self-pay

## 2016-01-14 ENCOUNTER — Ambulatory Visit (HOSPITAL_COMMUNITY)
Admission: EM | Admit: 2016-01-14 | Discharge: 2016-01-14 | Disposition: A | Discharge status: Home / Self Care | Payer: BLUE CROSS/BLUE SHIELD | Attending: Emergency Medicine | Admitting: Emergency Medicine

## 2016-01-14 ENCOUNTER — Encounter (HOSPITAL_COMMUNITY): Payer: Self-pay

## 2016-01-14 DIAGNOSIS — E7800 Pure hypercholesterolemia, unspecified: Secondary | ICD-10-CM | POA: Diagnosis not present

## 2016-01-14 DIAGNOSIS — Z87891 Personal history of nicotine dependence: Secondary | ICD-10-CM | POA: Insufficient documentation

## 2016-01-14 DIAGNOSIS — I10 Essential (primary) hypertension: Secondary | ICD-10-CM | POA: Insufficient documentation

## 2016-01-14 DIAGNOSIS — J029 Acute pharyngitis, unspecified: Secondary | ICD-10-CM | POA: Insufficient documentation

## 2016-01-14 DIAGNOSIS — J302 Other seasonal allergic rhinitis: Secondary | ICD-10-CM | POA: Diagnosis not present

## 2016-01-14 LAB — POCT RAPID STREP A: STREPTOCOCCUS, GROUP A SCREEN (DIRECT): NEGATIVE

## 2016-01-14 NOTE — Discharge Instructions (Signed)
Allergic Rhinitis For nasal and head congestion may take Sudafed PE 10 mg every 4 hours as needed as long as blood pressure is normal and in good control. Saline nasal spray used frequently. For drainage may use Allegra, Claritin or Zyrtec. If you need stronger medicine to stop drainage may take Chlor-Trimeton 2-4 mg every 4 hours. This may cause drowsiness. Ibuprofen 600 mg every 6 hours as needed for pain, discomfort or fever. Drink plenty of cool fluids and stay well-hydrated. Rhinocort or Flonase nasal spray daily for allergies Cepacol Lozenges for sore throat pain.  Allergic rhinitis is when the mucous membranes in the nose respond to allergens. Allergens are particles in the air that cause your body to have an allergic reaction. This causes you to release allergic antibodies. Through a chain of events, these eventually cause you to release histamine into the blood stream. Although meant to protect the body, it is this release of histamine that causes your discomfort, such as frequent sneezing, congestion, and an itchy, runny nose.  CAUSES Seasonal allergic rhinitis (hay fever) is caused by pollen allergens that may come from grasses, trees, and weeds. Year-round allergic rhinitis (perennial allergic rhinitis) is caused by allergens such as house dust mites, pet dander, and mold spores. SYMPTOMS  Nasal stuffiness (congestion).  Itchy, runny nose with sneezing and tearing of the eyes. DIAGNOSIS Your health care provider can help you determine the allergen or allergens that trigger your symptoms. If you and your health care provider are unable to determine the allergen, skin or blood testing may be used. Your health care provider will diagnose your condition after taking your health history and performing a physical exam. Your health care provider may assess you for other related conditions, such as asthma, pink eye, or an ear infection. TREATMENT Allergic rhinitis does not have a cure, but it  can be controlled by:  Medicines that block allergy symptoms. These may include allergy shots, nasal sprays, and oral antihistamines.  Avoiding the allergen. Hay fever may often be treated with antihistamines in pill or nasal spray forms. Antihistamines block the effects of histamine. There are over-the-counter medicines that may help with nasal congestion and swelling around the eyes. Check with your health care provider before taking or giving this medicine. If avoiding the allergen or the medicine prescribed do not work, there are many new medicines your health care provider can prescribe. Stronger medicine may be used if initial measures are ineffective. Desensitizing injections can be used if medicine and avoidance does not work. Desensitization is when a patient is given ongoing shots until the body becomes less sensitive to the allergen. Make sure you follow up with your health care provider if problems continue. HOME CARE INSTRUCTIONS It is not possible to completely avoid allergens, but you can reduce your symptoms by taking steps to limit your exposure to them. It helps to know exactly what you are allergic to so that you can avoid your specific triggers. SEEK MEDICAL CARE IF:  You have a fever.  You develop a cough that does not stop easily (persistent).  You have shortness of breath.  You start wheezing.  Symptoms interfere with normal daily activities.   This information is not intended to replace advice given to you by your health care provider. Make sure you discuss any questions you have with your health care provider.   Document Released: 05/02/2001 Document Revised: 08/28/2014 Document Reviewed: 04/14/2013 Elsevier Interactive Patient Education [employer].

## 2016-01-14 NOTE — ED Notes (Signed)
Pt presents with sore throat x2 days, she states throat feels as though something is cutting in her throat and there is burning and itching in that area, radiating towards sinus and ears  No acute distress

## 2016-01-14 NOTE — ED Provider Notes (Signed)
CSN: [id number]     Arrival date & time 01/14/16  1355 History   First MD Initiated Contact with Patient 01/14/16 1403     Chief Complaint  Patient presents with  . Sore Throat   (Consider location/radiation/quality/duration/timing/severity/associated sxs/prior Treatment) HPI Comments: 55 year old female complaining of discomfort to the bilateral ears, sinus congestion, sore throat, runny nose and nasal stuffiness for 2 days. Denies fevers. Denies fatigue and malaise.   Past Medical History  Diagnosis Date  . Hypertension   . Hypercholesteremia   . Migraine    Past Surgical History  Procedure Laterality Date  . Abdominal hysterectomy     Family History  Problem Relation Age of Onset  . Alcohol abuse Mother   . Diabetes Mother   . Hyperlipidemia Mother   . Hypertension Mother   . Diabetes Brother   . Hyperlipidemia Brother   . Hypertension Brother    Social History  Substance Use Topics  . Smoking status: Former Research scientist (life sciences)  . Smokeless tobacco: Former Systems developer    Quit date: 03/11/1989  . Alcohol Use: No   OB History    No data available     Review of Systems  Constitutional: Negative for fever, chills, activity change, appetite change and fatigue.  HENT: Positive for congestion, ear pain, postnasal drip, rhinorrhea and sore throat. Negative for facial swelling and trouble swallowing.   Eyes: Negative.   Respiratory: Negative.  Negative for chest tightness and shortness of breath.   Cardiovascular: Negative.   Musculoskeletal: Negative for neck pain and neck stiffness.  Skin: Negative for pallor and rash.  Neurological: Negative.     Allergies  Ciprofloxacin; Hydrocodone; Hydrocortisone; and Ace inhibitors  Home Medications   Prior to Admission medications   Medication Sig Start Date End Date Taking? Authorizing Provider  atorvastatin (LIPITOR) 20 MG tablet Take 1 tablet (20 mg total) by mouth daily. 11/15/15  Yes Pixie Casino, MD  CALCIUM-MAGNESIUM-VITAMIN D PO  Take 1 tablet by mouth daily.   Yes Historical Provider, MD  cyclobenzaprine (FLEXERIL) 10 MG tablet Take 1 tablet (10 mg total) by mouth at bedtime. 12/28/15  Yes Willeen Niece, MD  losartan (COZAAR) 50 MG tablet Take 1 tablet (50 mg total) by mouth daily. 12/20/15  Yes Pixie Casino, MD  Multiple Vitamins-Minerals (CENTRUM ADULTS PO) Take 1 tablet by mouth daily.   Yes Historical Provider, MD  pantoprazole (PROTONIX) 40 MG tablet Take 1 tablet (40 mg total) by mouth daily. 12/20/15  Yes Pixie Casino, MD  buPROPion (WELLBUTRIN XL) 150 MG 24 hr tablet Take 1 tablet (150 mg total) by mouth daily. 07/15/14   Melony Overly, MD   Meds Ordered and Administered this Visit  Medications - No data to display  BP 163/95 mmHg  Pulse 68  Temp(Src) 97.9 F (36.6 C) (Oral)  Resp 16  SpO2 98% No data found.   Physical Exam  Constitutional: She is oriented to person, place, and time. She appears well-developed and well-nourished. No distress.  HENT:  Mouth/Throat: No oropharyngeal exudate.  Bilateral TMs are mildly retracted. No erythema or effusion.  Oropharynx with minor erythema. No exudates. The soft palate with mild erythema and small red macules.  Eyes: Conjunctivae and EOM are normal.  Neck: Normal range of motion. Neck supple.  Cardiovascular: Normal rate, regular rhythm and normal heart sounds.   Pulmonary/Chest: Effort normal and breath sounds normal. No respiratory distress. She has no wheezes. She has no rales.  Musculoskeletal: Normal range of motion.  She exhibits no edema.  Lymphadenopathy:    She has no cervical adenopathy.  Neurological: She is alert and oriented to person, place, and time.  Skin: Skin is warm and dry. No rash noted.  Psychiatric: She has a normal mood and affect.  Nursing note and vitals reviewed.   ED Course  Procedures (including critical care time)  Labs Review Labs Reviewed  POCT RAPID STREP A   Results for orders placed or performed during the  hospital encounter of 01/14/16  POCT rapid strep A [facility])  Result Value Ref Range   Streptococcus, Group A Screen (Direct) NEGATIVE NEGATIVE     Imaging Review No results found.   Visual Acuity Review  Right Eye Distance:   Left Eye Distance:   Bilateral Distance:    Right Eye Near:   Left Eye Near:    Bilateral Near:         MDM   1. Other seasonal allergic rhinitis   2. Allergic pharyngitis    Allergic Rhinitis For nasal and head congestion may take Sudafed PE 10 mg every 4 hours as needed as long as blood pressure is normal and in good control. Saline nasal spray used frequently. For drainage may use Allegra, Claritin or Zyrtec. If you need stronger medicine to stop drainage may take Chlor-Trimeton 2-4 mg every 4 hours. This may cause drowsiness. Ibuprofen 600 mg every 6 hours as needed for pain, discomfort or fever. Drink plenty of cool fluids and stay well-hydrated. Rhinocort or Flonase nasal spray daily for allergies Cepacol Lozenges for sore throat pain.    Janne Napoleon, NP 01/14/16 1427

## 2016-01-17 LAB — CULTURE, GROUP A STREP (THRC)

## 2016-02-08 ENCOUNTER — Telehealth: Payer: Self-pay | Admitting: *Deleted

## 2016-02-08 ENCOUNTER — Ambulatory Visit: Payer: Self-pay | Admitting: Neurology

## 2016-02-08 NOTE — Telephone Encounter (Signed)
Called at 2:43pm to cancel 3:30 new patient appointment.

## 2016-02-09 ENCOUNTER — Encounter: Payer: Self-pay | Admitting: Neurology

## 2016-02-26 ENCOUNTER — Ambulatory Visit (INDEPENDENT_AMBULATORY_CARE_PROVIDER_SITE_OTHER): Payer: BLUE CROSS/BLUE SHIELD

## 2016-02-26 ENCOUNTER — Encounter (HOSPITAL_COMMUNITY): Payer: Self-pay | Admitting: Family Medicine

## 2016-02-26 ENCOUNTER — Ambulatory Visit (HOSPITAL_COMMUNITY)
Admission: EM | Admit: 2016-02-26 | Discharge: 2016-02-26 | Disposition: A | Discharge status: Home / Self Care | Payer: BLUE CROSS/BLUE SHIELD | Attending: Radiology | Admitting: Radiology

## 2016-02-26 DIAGNOSIS — M25562 Pain in left knee: Secondary | ICD-10-CM | POA: Diagnosis not present

## 2016-02-26 MED ORDER — HYDROCODONE-ACETAMINOPHEN 5-325 MG PO TABS: 2.0000 | ORAL_TABLET | Freq: Four times a day (QID) | ORAL | Status: DC | PRN

## 2016-02-26 MED ORDER — HYDROCODONE-ACETAMINOPHEN 5-325 MG PO TABS: 1.0000 | ORAL_TABLET | Freq: Four times a day (QID) | ORAL | Status: DC | PRN

## 2016-02-26 MED ORDER — ONDANSETRON HCL 4 MG PO TABS: 4.0000 mg | ORAL_TABLET | Freq: Four times a day (QID) | ORAL | Status: DC

## 2016-02-26 MED ORDER — ONDANSETRON 4 MG PREPACK (~~LOC~~): 1.0000 | ORAL_TABLET | Freq: Three times a day (TID) | ORAL | Status: DC | PRN

## 2016-02-26 NOTE — ED Notes (Signed)
Pt here for left knee pain that started this am. sts that yesterday at work she might have twisted her knee. sts she always has pain in left upper thigh but never knee and sometimes it radiates into left foot. Pt unable to bear weight on knee.

## 2016-02-26 NOTE — ED Provider Notes (Signed)
CSN: [id number]     Arrival date & time 02/26/16  1256 History   None    Chief Complaint  Patient presents with  . Knee Pain   (Consider location/radiation/quality/duration/timing/severity/associated sxs/prior Treatment) HPI Comments: Patient presents with sudden onset of left knee pain while rising from a seated postition. Patient denies any trauma and describes the pain as "burning and tearing". Patient states that she unable to bear weight on the affect leg. No treatments attempted prior to patient's arrival to this facility  Patient has recently been diagnosed with Degenerative disc disease and is scheduled for a cortisone injection on Tuesday.   Patient is a 55 y.o. female presenting with knee pain. The history is provided by the patient and a relative.  Knee Pain Location:  Leg (left knee) Time since incident:  2 hours Injury: no   Pain details:    Quality:  Tearing and burning   Radiates to:  Does not radiate   Severity:  Severe   Onset quality:  Sudden (while raising up from sitting position )   Timing:  Constant   Progression:  Unchanged Chronicity:  New Relieved by:  Nothing Worsened by:  Nothing tried Ineffective treatments:  None tried Associated symptoms comment:  Patient states that she has degenerative disc disease and is scheduled for a cortisone injection on Tuesday,  Risk factors: obesity     Past Medical History  Diagnosis Date  . Hypertension   . Hypercholesteremia   . Migraine    Past Surgical History  Procedure Laterality Date  . Abdominal hysterectomy     Family History  Problem Relation Age of Onset  . Alcohol abuse Mother   . Diabetes Mother   . Hyperlipidemia Mother   . Hypertension Mother   . Diabetes Brother   . Hyperlipidemia Brother   . Hypertension Brother    Social History  Substance Use Topics  . Smoking status: Former Research scientist (life sciences)  . Smokeless tobacco: Former Systems developer    Quit date: 03/11/1989  . Alcohol Use: No   OB History    No data  available     Review of Systems  Constitutional: Negative.   Respiratory: Negative.   Musculoskeletal:       Left knee pain. No swelling, no erythema noted. Pain with palpation.  Patient states that she is unable to bear weight on affected leg.     Allergies  Ciprofloxacin; Hydrocodone; Hydrocortisone; and Ace inhibitors  Home Medications   Prior to Admission medications   Medication Sig Start Date End Date Taking? Authorizing Provider  atorvastatin (LIPITOR) 20 MG tablet Take 1 tablet (20 mg total) by mouth daily. 11/15/15   Pixie Casino, MD  buPROPion (WELLBUTRIN XL) 150 MG 24 hr tablet Take 1 tablet (150 mg total) by mouth daily. 07/15/14   Melony Overly, MD  CALCIUM-MAGNESIUM-VITAMIN D PO Take 1 tablet by mouth daily.    Historical Provider, MD  cyclobenzaprine (FLEXERIL) 10 MG tablet Take 1 tablet (10 mg total) by mouth at bedtime. 12/28/15   Willeen Niece, MD  losartan (COZAAR) 50 MG tablet Take 1 tablet (50 mg total) by mouth daily. 12/20/15   Pixie Casino, MD  Multiple Vitamins-Minerals (CENTRUM ADULTS PO) Take 1 tablet by mouth daily.    Historical Provider, MD  pantoprazole (PROTONIX) 40 MG tablet Take 1 tablet (40 mg total) by mouth daily. 12/20/15   Pixie Casino, MD   Meds Ordered and Administered this Visit  Medications - No data to  display  BP 154/85 mmHg  Pulse 63  Temp(Src) 97.9 F (36.6 C)  Resp 18  SpO2 99% No data found.   Physical Exam  Constitutional: She is oriented to person, place, and time. She appears well-developed and well-nourished.  Pulmonary/Chest: Effort normal.  Musculoskeletal:       Left knee: She exhibits no swelling, no effusion, no deformity and no erythema. Tenderness found.  Neurological: She is alert and oriented to person, place, and time.    ED Course  Procedures (including critical care time)  Labs Review Labs Reviewed - No data to display  Imaging Review No results found.   Visual Acuity Review  Right Eye  Distance:   Left Eye Distance:   Bilateral Distance:    Right Eye Near:   Left Eye Near:    Bilateral Near:         MDM   1. Knee pain, acute, left    Rest, ice and follow up with orthopedics.    Jacqualine Mau, NP 02/26/16 1657

## 2016-03-15 ENCOUNTER — Ambulatory Visit: Payer: BLUE CROSS/BLUE SHIELD | Admitting: Internal Medicine

## 2016-04-17 ENCOUNTER — Encounter: Payer: Self-pay | Admitting: Neurology

## 2016-04-17 ENCOUNTER — Ambulatory Visit (INDEPENDENT_AMBULATORY_CARE_PROVIDER_SITE_OTHER): Payer: BLUE CROSS/BLUE SHIELD | Admitting: Neurology

## 2016-04-17 VITALS — BP 120/78 | HR 62 | Ht 64.0 in | Wt 310.2 lb

## 2016-04-17 DIAGNOSIS — G5713 Meralgia paresthetica, bilateral lower limbs: Secondary | ICD-10-CM

## 2016-04-17 DIAGNOSIS — R208 Other disturbances of skin sensation: Secondary | ICD-10-CM | POA: Diagnosis not present

## 2016-04-17 DIAGNOSIS — R200 Anesthesia of skin: Secondary | ICD-10-CM

## 2016-04-17 MED ORDER — LIDOCAINE 5 % EX OINT: TOPICAL_OINTMENT | CUTANEOUS | 3 refills | Status: DC

## 2016-04-17 MED ORDER — GABAPENTIN 300 MG PO CAPS: 300.0000 mg | ORAL_CAPSULE | Freq: Every day | ORAL | 5 refills | Status: DC

## 2016-04-17 NOTE — Progress Notes (Signed)
Note routed

## 2016-04-17 NOTE — Patient Instructions (Addendum)
1.  Start gabapentin 300mg  at bedtime 2.  Start lidocaine ointment to thighs for burning pain 3.  NCS/EMG of the legs 4.  Start water exercises  Further recommendations will be given based on the results of your nerve testing.

## 2016-04-17 NOTE — Progress Notes (Signed)
[facility] Clinic Note - Initial Visit   Date: 04/17/16  Kelsey Santana MRN: [id number] DOB: 18-Oct-1960   Dear Dr. Nelva Bush:  Thank you for your kind referral of Kelsey Santana for consultation of bilateral leg weakness. Although her history is well known to you, please allow [place] to reiterate it for the purpose of our medical record. The patient was accompanied to the clinic by self.    History of Present Illness: Kelsey Santana is a 54 y.o. right-handed African [place] female with hyperlipidemia, hypertension, GERD, episodic migraine, former tobacco use, and chronic low back pain presenting for evaluation of bilateral leg pain.    She complains of numbness and tingling sensation over the lateral thigh which radiates into her lower legs and heel started in the summer 2016.  She now has burning sensation.  She denies wearing tight belt or constrictive clothing.  She has always been overweight and has been trying to go to the gym to loose her weight.  Last year, she was walking 4 miles per day and using a stationary bike, but since her new leg pain has started, she stopped going to the gym.  She also has spells of bilateral entire leg numbness which is worse with standing and improved by rest.  Three weeks ago, she developed severe shooting pain from her leg which radiated down her leg, into her heel for which she went to the ER.  She had plain films which was normal and discharged with vicodin on crutches.  She also reports having gait instability and falls.  One fall occurred because of severe pain in the left leg as if someone was tearing something inside her leg and the other occurred while stepping out to pump gas and her right leg simply buckled and went out.  She required assistance to stand up.  She has throbbing pain in her knees which is worse when walking.  She has tenderness around her knees with palpation.    She has been getting ESI for low back  pain with Dr. Nelva Bush which has helped low back pain, but provided no relief to her burning thigh pain or bilateral leg numbness.  MRI lumbar spine from May 2017 does not show any neural impingement. No shooting pain down her neck or urinary/bowel incontinence.  Out-side paper records, electronic medical record, and images have been reviewed where available and summarized as:  MRI lumbar spine 01/15/2016:   1.  L5-S1:  Moderate facet DJD and mild disc osteophyte complex without stenosis 2.  L4-L5:  Severe facet DJD without stenosis 3.  L3-L4:  Disc degeneration without stenosis    Past Medical History:  Diagnosis Date  . Hypercholesteremia   . Hypertension   . Migraine     Past Surgical History:  Procedure Laterality Date  . ABDOMINAL HYSTERECTOMY       Medications:  Outpatient Encounter Prescriptions as of 04/17/2016  Medication Sig Note  . atorvastatin (LIPITOR) 20 MG tablet Take 1 tablet (20 mg total) by mouth daily.   Marland Kitchen CALCIUM-MAGNESIUM-VITAMIN D PO Take 1 tablet by mouth daily.   . cyclobenzaprine (FLEXERIL) 10 MG tablet Take 1 tablet (10 mg total) by mouth at bedtime.   . DUEXIS 800-26.6 MG TABS Take 1 tablet by mouth 3 (three) times daily. 04/17/2016: Received from: External Pharmacy Received Sig: TAKE ONE TABLET BY MOUTH THREE TIMES DAILY  . losartan (COZAAR) 50 MG tablet Take 1 tablet (50 mg total) by mouth daily.   Marland Kitchen  Multiple Vitamins-Minerals (CENTRUM ADULTS PO) Take 1 tablet by mouth daily.   . ondansetron (ZOFRAN) 4 MG tablet Take 1 tablet (4 mg total) by mouth every 6 (six) hours.   . pantoprazole (PROTONIX) 40 MG tablet Take 1 tablet (40 mg total) by mouth daily.   . [DISCONTINUED] buPROPion (WELLBUTRIN XL) 150 MG 24 hr tablet Take 1 tablet (150 mg total) by mouth daily.   . [DISCONTINUED] HYDROcodone-acetaminophen (NORCO/VICODIN) 5-325 MG tablet Take 2 tablets by mouth every 6 (six) hours as needed for moderate pain.   . [DISCONTINUED] meloxicam (MOBIC) 15 MG tablet TK  1 T PO D 04/17/2016: Received from: External Pharmacy  . [DISCONTINUED] traMADol (ULTRAM) 50 MG tablet TK 1 T PO TID PRN 04/17/2016: Received from: External Pharmacy  . gabapentin (NEURONTIN) 300 MG capsule Take 1 capsule (300 mg total) by mouth at bedtime.   . lidocaine (XYLOCAINE) 5 % ointment Apply to thigh daily as needed for pain.    No facility-administered encounter medications on file as of 04/17/2016.      Allergies:  Allergies  Allergen Reactions  . Ciprofloxacin     itching  . Hydrocodone     Nausea, itching  . Hydrocortisone Nausea Only  . Ace Inhibitors Cough    Family History: Family History  Problem Relation Age of Onset  . Alcohol abuse Mother   . Diabetes Mother   . Hyperlipidemia Mother   . Hypertension Mother   . Diabetes Brother   . Hyperlipidemia Brother   . Hypertension Brother     Social History: Social History  Substance Use Topics  . Smoking status: Former Research scientist (life sciences)  . Smokeless tobacco: Former Systems developer    Quit date: 03/11/1989  . Alcohol use No   Social History   Social History Narrative   epworth sleepiness scale = 10 (11/15/15)    Lives alone in a 2 story home.  Has 3 children.  Currently not working.  Has lost 6 jobs in the past 2 months due to her leg numbness and pain.     Education: 2 years of college.    Review of Systems:  CONSTITUTIONAL: No fevers, chills, night sweats, or weight loss.   EYES: No visual changes or eye pain ENT: No hearing changes.  No history of nose bleeds.   RESPIRATORY: No cough, wheezing and shortness of breath.   CARDIOVASCULAR: Negative for chest pain, and palpitations.   GI: Negative for abdominal discomfort, blood in stools or black stools.  No recent change in bowel habits.   GU:  No history of incontinence.   MUSCLOSKELETAL: +history of joint pain or swelling.  No myalgias.   SKIN: Negative for lesions, rash, and itching.   HEMATOLOGY/ONCOLOGY: Negative for prolonged bleeding, bruising easily, and swollen  nodes.  No history of cancer.   ENDOCRINE: Negative for cold or heat intolerance, polydipsia or goiter.   PSYCH:  No depression or anxiety symptoms.   NEURO: As Above.   Vital Signs:  BP 120/78   Pulse 62   Ht 5\' 4"  (1.626 m)   Wt (!) 310 lb 4 oz (140.7 kg)   SpO2 97%   BMI 53.25 kg/m    General Medical Exam:   General:  Well appearing, obese, comfortable.   Eyes/ENT: see cranial nerve examination.   Neck: No masses appreciated.  Full range of motion without tenderness.  No carotid bruits. Respiratory:  Clear to auscultation, good air entry bilaterally.   Cardiac:  Regular rate and rhythm, no murmur.  Extremities:  No deformities, edema, or skin discoloration.  Skin:  No rashes or lesions.  Neurological Exam: MENTAL STATUS including orientation to time, place, person, recent and remote memory, attention span and concentration, language, and fund of knowledge is normal.  Speech is not dysarthric.  CRANIAL NERVES: II:  No visual field defects.  Unremarkable fundi.   III-IV-VI: Pupils equal round and reactive to light.  Normal conjugate, extra-ocular eye movements in all directions of gaze.  No nystagmus.  No ptosis.   V:  Normal facial sensation.   VII:  Normal facial symmetry and movements.   VIII:  Normal hearing and vestibular function.   IX-X:  Normal palatal movement.   XI:  Normal shoulder shrug and head rotation.   XII:  Normal tongue strength and range of motion, no deviation or fasciculation.  MOTOR:  No atrophy, fasciculations or abnormal movements.  No pronator drift.  Tone is normal.    Right Upper Extremity:    Left Upper Extremity:    Deltoid  5/5   Deltoid  5/5   Biceps  5/5   Biceps  5/5   Triceps  5/5   Triceps  5/5   Wrist extensors  5/5   Wrist extensors  5/5   Wrist flexors  5/5   Wrist flexors  5/5   Finger extensors  5/5   Finger extensors  5/5   Finger flexors  5/5   Finger flexors  5/5   Dorsal interossei  5/5   Dorsal interossei  5/5   Abductor  pollicis  5/5   Abductor pollicis  5/5   Tone (Ashworth scale)  0  Tone (Ashworth scale)  0   Right Lower Extremity:    Left Lower Extremity:    Hip flexors  5/5   Hip flexors  5/5   Hip extensors  5/5   Hip extensors  5/5   Knee flexors  5/5   Knee flexors  5/5   Knee extensors  5/5   Knee extensors  5/5   Dorsiflexors  5/5   Dorsiflexors  5/5   Plantarflexors  5/5   Plantarflexors  5/5   Toe extensors  5/5   Toe extensors  5/5   Toe flexors  5/5   Toe flexors  5/5   Tone (Ashworth scale)  0  Tone (Ashworth scale)  0   MSRs:  Right                                                                 Left brachioradialis 3+  brachioradialis 3+  biceps 3+  biceps 3+  triceps 2+  triceps 2+  patellar 2+  patellar 2+  ankle jerk 2+  ankle jerk 2+  Hoffman no  Hoffman no  plantar response down  plantar response down   SENSORY:  Pin prick and temperature is reduced over the lateral aspect of the thigh bilaterally.  Sensation is preserved in the medial thigh, lower legs, and upper extremities.  COORDINATION/GAIT: Normal finger-to- nose-finger and heel-to-shin.  Intact rapid alternating movements bilaterally.  Unable to rise from a chair without using arms.  Gait wide-based due to body habitus, unassisted.    IMPRESSION: Ms. Fasolino is a 55 year-old female referred for evaluation of bilateral leg pain  and numbness.  Her exam is disclosed sensory loss over the lateral femoral cutaneous nerve distribution, consistent with meralgia paresthetica.  However, this would only explain her burning thigh pain.  Regarding her episodic leg numbness with prolonged standing/walking, this is suggestive of neurogenic claudication, however there was no evidence of canal stenosis on MRI lumbar spine from May 2017.   I will perform NCS/EMG of the legs to see if there is a superimposed neuropathy, but this would be unusual presentation for neuropathy to cause episodic numbness of the entire lower extremities.  If her  NCS/EMG is nondiagnostic, MRI cervical spine will be the next step.   PLAN/RECOMMENDATIONS:  1.  Start gabapentin 300mg  at bedtime 2.  Start lidocaine ointment to thighs for burning pain 3.  NCS/EMG of the legs 4.  Start water exercises  Further recommendations will be given based on the results of your nerve testing.   The duration of this appointment visit was 50 minutes of face-to-face time with the patient.  Greater than 50% of this time was spent in counseling, explanation of diagnosis, planning of further management, and coordination of care.   Thank you for allowing me to participate in patient's care.  If I can answer any additional questions, I would be pleased to do so.    Sincerely,    Kelsey Fregeau K. Posey Pronto, DO

## 2016-04-18 ENCOUNTER — Other Ambulatory Visit: Payer: Self-pay | Admitting: *Deleted

## 2016-04-18 ENCOUNTER — Ambulatory Visit (INDEPENDENT_AMBULATORY_CARE_PROVIDER_SITE_OTHER): Payer: BLUE CROSS/BLUE SHIELD | Admitting: Neurology

## 2016-04-18 ENCOUNTER — Telehealth: Payer: Self-pay | Admitting: Neurology

## 2016-04-18 DIAGNOSIS — G609 Hereditary and idiopathic neuropathy, unspecified: Secondary | ICD-10-CM

## 2016-04-18 NOTE — Procedures (Signed)
[facility]  [address]  [place], [place] [address] Tel: [phone] Fax:  [phone] Test Date:  04/18/2016  Patient: Kelsey Santana DOB: [birth year] Physician: Narda Amber, DO  Sex: Female Height: 5\' 5"  Ref Phys: Narda Amber, DO  ID#: [id number] Temp: 34.6C Technician: Jerilynn Mages. Dean   Patient Complaints: This is a 55 year old female referred for evaluation of bilateral leg paresthesias and pain.  NCV & EMG Findings: Extensive electrodiagnostic testing of the right lower extremity and additional studies of the left shows: 1. Bilateral sural and superficial peroneal sensory responses are within normal limits. 2. Bilateral peroneal and tibial motor responses are within normal limits. 3. Bilateral tibial H reflex studies are within normal limits. 4. There is no evidence of active or chronic motor axon loss changes affecting any of the tested muscles. Motor unit configuration and recruitment pattern is within normal limits.  Impression: This is a normal study of the lower extremities. In particular, there is no evidence of a generalized sensorimotor polyneuropathy, diffuse myopathy, or lumbosacral radiculopathy.   ___________________________ Narda Amber, DO    Nerve Conduction Studies Anti Sensory Summary Table   Site NR Peak (ms) Norm Peak (ms) P-T Amp (V) Norm P-T Amp  Left Sup Peroneal Anti Sensory (Ant Lat Mall)  34.6C  12 cm    3.4 <4.6 9.2 >4  Right Sup Peroneal Anti Sensory (Ant Lat Mall)  34.6C  12 cm    3.4 <4.6 8.0 >4  Left Sural Anti Sensory (Lat Mall)  34.6C  Calf    3.0 <4.6 5.0 >4  Right Sural Anti Sensory (Lat Mall)  34.6C  Calf    3.3 <4.6 6.6 >4   Motor Summary Table   Site NR Onset (ms) Norm Onset (ms) O-P Amp (mV) Norm O-P Amp Site1 Site2 Delta-0 (ms) Dist (cm) Vel (m/s) Norm Vel (m/s)  Left Peroneal Motor (Ext Dig Brev)  34.6C  Ankle    3.0 <6.0 8.4 >2.5 B Fib Ankle 7.2 33.0 46 >40  B Fib    10.2  6.8  Poplt B Fib 1.5  10.0 67 >40  Poplt    11.7  6.7         Right Peroneal Motor (Ext Dig Brev)  34.6C  Ankle    3.1 <6.0 6.6 >2.5 B Fib Ankle 7.4 33.0 45 >40  B Fib    10.5  5.3  Poplt B Fib 1.8 10.0 56 >40  Poplt    12.3  4.9         Left Tibial Motor (Abd Hall Brev)  34.6C  Ankle    2.8 <6.0 6.8 >4 Knee Ankle 8.5 38.0 45 >40  Knee    11.3  5.4         Right Tibial Motor (Abd Hall Brev)  34.6C  Ankle    4.0 <6.0 5.5 >4 Knee Ankle 9.0 39.0 43 >40  Knee    13.0  3.0          H Reflex Studies   NR H-Lat (ms) Lat Norm (ms) L-R H-Lat (ms) M-Lat (ms) HLat-MLat (ms)  Left Tibial (Gastroc)  34.6C     33.61 <35 3.41 5.31 28.30  Right Tibial (Gastroc)  34.6C     30.20 <35 3.41 5.31 24.89   EMG   Side Muscle Ins Act Fibs Psw Fasc Number Recrt Dur Dur. Amp Amp. Poly Poly. Comment  Right AntTibialis Nml Nml Nml Nml Nml Nml Nml Nml Nml Nml Nml Nml N/A  Right GluteusMed Nml Nml Nml Nml Nml Nml Nml Nml Nml Nml Nml Nml N/A  Right Gastroc Nml Nml Nml Nml Nml Nml Nml Nml Nml Nml Nml Nml N/A  Right Flex Dig Long Nml Nml Nml Nml Nml Nml Nml Nml Nml Nml Nml Nml N/A  Right RectFemoris Nml Nml Nml Nml Nml Nml Nml Nml Nml Nml Nml Nml N/A  Left AntTibialis Nml Nml Nml Nml Nml Nml Nml Nml Nml Nml Nml Nml N/A  Left Gastroc Nml Nml Nml Nml Nml Nml Nml Nml Nml Nml Nml Nml N/A  Left Flex Dig Long Nml Nml Nml Nml Nml Nml Nml Nml Nml Nml Nml Nml N/A  Left RectFemoris Nml Nml Nml Nml Nml Nml Nml Nml Nml Nml Nml Nml N/A      Waveforms:

## 2016-04-18 NOTE — Telephone Encounter (Signed)
Results of EMG discussed with patient which shows no evidence of nerve or muscle injury.  She reports having benefit with gabapentin 300 mg at bedtime and will continue medical management for her pain. Lidocaine ointment to have lateral thigh for myalgia paresthetica has provided significant relief.  She continues to have localized right medial knee tenderness with palpation and achy pain, which is more suggestive of tendinitis such as has as pes anserinus.  I will refer her to Dr. Tamala Julian in sports medicine for further evaluation of her knee pain.  Donika K. Posey Pronto, DO

## 2016-04-19 ENCOUNTER — Other Ambulatory Visit: Payer: Self-pay | Admitting: *Deleted

## 2016-04-19 DIAGNOSIS — M25569 Pain in unspecified knee: Secondary | ICD-10-CM

## 2016-04-19 NOTE — Telephone Encounter (Signed)
Referral placed in epic.

## 2016-04-27 ENCOUNTER — Telehealth: Payer: Self-pay | Admitting: Neurology

## 2016-04-27 ENCOUNTER — Other Ambulatory Visit: Payer: Self-pay | Admitting: *Deleted

## 2016-04-27 MED ORDER — GABAPENTIN 100 MG PO CAPS: 100.0000 mg | ORAL_CAPSULE | Freq: Every day | ORAL | 3 refills | Status: DC

## 2016-04-27 NOTE — Telephone Encounter (Signed)
Kelsey Santana  06/25/1961. Her # is [id number]. She was following up on her referral for her knee pain. She didn't know if they were to follow up or [place]? She said she was doing well on  gabapentin and now needs a refill for 100 mg for day time.  Thank you

## 2016-04-27 NOTE — Telephone Encounter (Signed)
Patient notified that Dr. Tamala Julian and his nurse are out until next week but that she should hear from them when they return.  Rx sent.

## 2016-05-05 ENCOUNTER — Other Ambulatory Visit: Payer: Self-pay | Admitting: *Deleted

## 2016-05-05 ENCOUNTER — Telehealth: Payer: Self-pay | Admitting: Neurology

## 2016-05-05 MED ORDER — LIDOCAINE 5 % EX OINT: TOPICAL_OINTMENT | CUTANEOUS | 3 refills | Status: DC

## 2016-05-05 NOTE — Telephone Encounter (Signed)
Please advise.  It looks like we added 100 mg of gabapentin during the day.

## 2016-05-05 NOTE — Telephone Encounter (Signed)
Patient given instructions and agreed with plan.  New Rx for lidocaine sent in.

## 2016-05-05 NOTE — Telephone Encounter (Signed)
Kelsey Santana  12/10/1960. She was calling in to see if there was anything else she could try to help with her pain in her thigh. She said what she's doing now is not helping. Her # is [phone]. Thank you

## 2016-05-05 NOTE — Telephone Encounter (Signed)
She is still on a low dose of gabapentin - we can increase to 300mg  twice daily, if she is tolerating it and its not making her too sleepy - or increase nighttime dose to 600mg .  Donika K. Posey Pronto, DO

## 2016-05-12 ENCOUNTER — Emergency Department (HOSPITAL_COMMUNITY)
Admission: EM | Admit: 2016-05-12 | Discharge: 2016-05-12 | Disposition: A | Discharge status: Home / Self Care | Payer: BLUE CROSS/BLUE SHIELD | Attending: Dermatology | Admitting: Dermatology

## 2016-05-12 ENCOUNTER — Encounter (HOSPITAL_COMMUNITY): Payer: Self-pay | Admitting: Emergency Medicine

## 2016-05-12 DIAGNOSIS — Z79899 Other long term (current) drug therapy: Secondary | ICD-10-CM | POA: Diagnosis not present

## 2016-05-12 DIAGNOSIS — M79605 Pain in left leg: Secondary | ICD-10-CM | POA: Insufficient documentation

## 2016-05-12 DIAGNOSIS — M79604 Pain in right leg: Secondary | ICD-10-CM | POA: Insufficient documentation

## 2016-05-12 DIAGNOSIS — I10 Essential (primary) hypertension: Secondary | ICD-10-CM | POA: Diagnosis not present

## 2016-05-12 DIAGNOSIS — Z87891 Personal history of nicotine dependence: Secondary | ICD-10-CM | POA: Insufficient documentation

## 2016-05-12 DIAGNOSIS — Z5321 Procedure and treatment not carried out due to patient leaving prior to being seen by health care provider: Secondary | ICD-10-CM | POA: Insufficient documentation

## 2016-05-12 NOTE — ED Notes (Signed)
Pt states the wait time is to long.

## 2016-05-12 NOTE — ED Triage Notes (Signed)
Pt from home with complaints of burning in her thighs. Pt states she has been seeing a neurologist who gives her spinal injections. Her last injection was about 1 month ago. Pt states the pain has stopped in her back, but she has a burning pain in her legs and thighs that is constant. Pt states she recently started taking gabapentin, but without relief

## 2016-05-14 ENCOUNTER — Encounter (HOSPITAL_COMMUNITY): Payer: Self-pay | Admitting: Emergency Medicine

## 2016-05-14 ENCOUNTER — Emergency Department (HOSPITAL_COMMUNITY)
Admission: EM | Admit: 2016-05-14 | Discharge: 2016-05-15 | Disposition: A | Discharge status: Home / Self Care | Payer: BLUE CROSS/BLUE SHIELD | Attending: Emergency Medicine | Admitting: Emergency Medicine

## 2016-05-14 DIAGNOSIS — M79604 Pain in right leg: Secondary | ICD-10-CM | POA: Insufficient documentation

## 2016-05-14 DIAGNOSIS — Z79899 Other long term (current) drug therapy: Secondary | ICD-10-CM | POA: Diagnosis not present

## 2016-05-14 DIAGNOSIS — M79605 Pain in left leg: Secondary | ICD-10-CM | POA: Insufficient documentation

## 2016-05-14 DIAGNOSIS — I10 Essential (primary) hypertension: Secondary | ICD-10-CM | POA: Diagnosis not present

## 2016-05-14 DIAGNOSIS — Z87891 Personal history of nicotine dependence: Secondary | ICD-10-CM | POA: Diagnosis not present

## 2016-05-14 LAB — COMPREHENSIVE METABOLIC PANEL
ALT: 20 U/L (ref 14–54)
AST: 18 U/L (ref 15–41)
Albumin: 3.2 g/dL — ABNORMAL LOW (ref 3.5–5.0)
Alkaline Phosphatase: 74 U/L (ref 38–126)
Anion gap: 8 (ref 5–15)
BILIRUBIN TOTAL: 0.4 mg/dL (ref 0.3–1.2)
BUN: 16 mg/dL (ref 6–20)
CO2: 28 mmol/L (ref 22–32)
CREATININE: 0.97 mg/dL (ref 0.44–1.00)
Calcium: 8.8 mg/dL — ABNORMAL LOW (ref 8.9–10.3)
Chloride: 103 mmol/L (ref 101–111)
Glucose, Bld: 104 mg/dL — ABNORMAL HIGH (ref 65–99)
POTASSIUM: 3.9 mmol/L (ref 3.5–5.1)
Sodium: 139 mmol/L (ref 135–145)
TOTAL PROTEIN: 5.9 g/dL — AB (ref 6.5–8.1)

## 2016-05-14 LAB — CBC WITH DIFFERENTIAL/PLATELET
BASOS ABS: 0 10*3/uL (ref 0.0–0.1)
Basophils Relative: 0 %
Eosinophils Absolute: 0.3 10*3/uL (ref 0.0–0.7)
Eosinophils Relative: 6 %
HEMATOCRIT: 40.1 % (ref 36.0–46.0)
Hemoglobin: 12.6 g/dL (ref 12.0–15.0)
LYMPHS ABS: 2.2 10*3/uL (ref 0.7–4.0)
LYMPHS PCT: 41 %
MCH: 28 pg (ref 26.0–34.0)
MCHC: 31.4 g/dL (ref 30.0–36.0)
MCV: 89.1 fL (ref 78.0–100.0)
MONO ABS: 0.3 10*3/uL (ref 0.1–1.0)
MONOS PCT: 6 %
NEUTROS ABS: 2.5 10*3/uL (ref 1.7–7.7)
Neutrophils Relative %: 47 %
Platelets: 241 10*3/uL (ref 150–400)
RBC: 4.5 MIL/uL (ref 3.87–5.11)
RDW: 13.5 % (ref 11.5–15.5)
WBC: 5.4 10*3/uL (ref 4.0–10.5)

## 2016-05-14 LAB — CK: Total CK: 129 U/L (ref 38–234)

## 2016-05-14 MED ORDER — HYDROMORPHONE HCL 1 MG/ML IJ SOLN
1.0000 mg | Freq: Once | INTRAMUSCULAR | Status: AC
  Administered 2016-05-14: 1 mg via INTRAVENOUS
  Filled 2016-05-14: qty 1

## 2016-05-14 MED ORDER — DEXAMETHASONE SODIUM PHOSPHATE 10 MG/ML IJ SOLN
10.0000 mg | Freq: Once | INTRAMUSCULAR | Status: AC
  Administered 2016-05-14: 10 mg via INTRAMUSCULAR
  Filled 2016-05-14: qty 1

## 2016-05-14 MED ORDER — ACETAMINOPHEN 500 MG PO TABS
1000.0000 mg | ORAL_TABLET | Freq: Once | ORAL | Status: AC
  Administered 2016-05-14: 1000 mg via ORAL
  Filled 2016-05-14: qty 2

## 2016-05-14 NOTE — Discharge Instructions (Addendum)
If you were given medicines take as directed.  If you are on coumadin or contraceptives realize their levels and effectiveness is altered by many different medicines.  If you have any reaction (rash, tongues swelling, other) to the medicines stop taking and see a physician.    Call your ortho doctor this week.   If your blood pressure was elevated in the ER make sure you follow up for management with a primary doctor or return for chest pain, shortness of breath or stroke symptoms.  Please follow up as directed and return to the ER or see a physician for new or worsening symptoms.  Thank you. Vitals:   05/14/16 2047 05/14/16 2236 05/14/16 2245 05/14/16 2300  BP:  158/88 150/86 161/91  Pulse:  64 66 76  Resp:      Temp:      TempSrc:      SpO2:  99% 97% 98%  Weight: (!) 303 lb (137.4 kg)     Height: 5\' 5"  (1.651 m)

## 2016-05-14 NOTE — ED Triage Notes (Signed)
Bilateral thigh pain R>L increasing for last three weeks. Has nerve problems that PCP is trying to manage. States in last week PCP has increased her gabapentin and prescribed lidocaine cream to rub on thighs. Nothing is helping. Seeking care tonight for intolerable pain.

## 2016-05-14 NOTE — ED Provider Notes (Signed)
[facility] DEPT Provider Note   CSN: [id number] Arrival date & time: 05/14/16  2036     History   Chief Complaint Chief Complaint  Patient presents with  . Leg Pain    HPI Kelsey Santana is a 55 y.o. female.  Patient presents with worsening burning pain down bilateral legs. Patient's had this for couple months however is worsening the past 3 or 4 days. Patient's been worked up by orthopedics and has degenerative back disease no spinal cord issues. Patient had MRI recently. Patient's had injections and respond neuropathic medications. Patient frustrated as she is unable to work and is had multiple jobs due to her medical problems. Pain worse with walking. No focal weakness. Patient still urinating okay.      Past Medical History:  Diagnosis Date  . Hypercholesteremia   . Hypertension   . Migraine     Patient Active Problem List   Diagnosis Date Noted  . Chest pain 11/15/2015  . Depression 04/15/2012  . Morbid obesity due to excess calories ([place]) 04/15/2012  . Tinea corporis 04/15/2012  . Fatigue 04/15/2012  . Epigastric pain 03/19/2012  . Migraine headache 03/11/2012  . GERD (gastroesophageal reflux disease) 03/11/2012  . HTN (hypertension) 03/11/2012  . HLD (hyperlipidemia) 03/11/2012    Past Surgical History:  Procedure Laterality Date  . ABDOMINAL HYSTERECTOMY      OB History    No data available       Home Medications    Prior to Admission medications   Medication Sig Start Date End Date Taking? Authorizing Provider  atorvastatin (LIPITOR) 20 MG tablet Take 1 tablet (20 mg total) by mouth daily. 11/15/15  Yes Pixie Casino, MD  cyclobenzaprine (FLEXERIL) 10 MG tablet Take 1 tablet (10 mg total) by mouth at bedtime. 12/28/15  Yes Willeen Niece, MD  DUEXIS 800-26.6 MG TABS Take 1 tablet by mouth 3 (three) times daily. 04/07/16  Yes Historical Provider, MD  gabapentin (NEURONTIN) 100 MG capsule Take 1 capsule (100 mg total) by mouth daily. 04/27/16   Yes Donika K Patel, DO  gabapentin (NEURONTIN) 300 MG capsule Take 1 capsule (300 mg total) by mouth at bedtime. 04/17/16  Yes Donika K Patel, DO  losartan (COZAAR) 50 MG tablet Take 1 tablet (50 mg total) by mouth daily. 12/20/15  Yes Pixie Casino, MD  Multiple Vitamin (MULTIVITAMIN WITH MINERALS) TABS tablet Take 1 tablet by mouth daily.   Yes Historical Provider, MD  pantoprazole (PROTONIX) 40 MG tablet Take 1 tablet (40 mg total) by mouth daily. 12/20/15  Yes Pixie Casino, MD  lidocaine (XYLOCAINE) 5 % ointment Apply to thigh daily as needed for pain. Patient not taking: Reported on 05/14/2016 05/05/16   Donika K Patel, DO  ondansetron (ZOFRAN) 4 MG tablet Take 1 tablet (4 mg total) by mouth every 6 (six) hours. Patient not taking: Reported on 05/14/2016 02/26/16   Jacqualine Mau, NP    Family History Family History  Problem Relation Age of Onset  . Alcohol abuse Mother   . Diabetes Mother   . Hyperlipidemia Mother   . Hypertension Mother   . Diabetes Brother   . Hyperlipidemia Brother   . Hypertension Brother     Social History Social History  Substance Use Topics  . Smoking status: Former Research scientist (life sciences)  . Smokeless tobacco: Former Systems developer    Quit date: 03/11/1989  . Alcohol use No     Allergies   Ciprofloxacin; Hydrocortisone; and Ace inhibitors   Review  of Systems Review of Systems  Constitutional: Negative for chills and fever.  HENT: Negative for congestion.   Eyes: Negative for visual disturbance.  Respiratory: Negative for shortness of breath.   Cardiovascular: Negative for chest pain.  Gastrointestinal: Negative for abdominal pain and vomiting.  Genitourinary: Negative for dysuria and flank pain.  Musculoskeletal: Negative for back pain, neck pain and neck stiffness.  Skin: Negative for rash.  Neurological: Positive for numbness. Negative for weakness, light-headedness and headaches.     Physical Exam Updated Vital Signs BP 161/91   Pulse 76   Temp 97.8 F  (36.6 C) (Oral)   Resp 16   Ht 5\' 5"  (1.651 m)   Wt (!) 303 lb (137.4 kg)   SpO2 98%   BMI 50.42 kg/m   Physical Exam  Constitutional: She is oriented to person, place, and time. She appears well-developed and well-nourished.  HENT:  Head: Normocephalic and atraumatic.  Eyes: Conjunctivae are normal. Right eye exhibits no discharge. Left eye exhibits no discharge.  Neck: Normal range of motion. Neck supple. No tracheal deviation present.  Cardiovascular: Normal rate.   Pulmonary/Chest: Effort normal.  Abdominal: Soft. She exhibits no distension. There is no tenderness. There is no guarding.  Musculoskeletal: She exhibits no edema.  Neurological: She is alert and oriented to person, place, and time.  Patient has 5+ strength with flexion extension of hips knees and great toes. Patient has sensation intact palpation in major nerves in lower extremities. Patient has equal 1+ reflexes bilateral lower extremities.  Skin: Skin is warm. No rash noted.  Psychiatric: She has a normal mood and affect.  Nursing note and vitals reviewed.    ED Treatments / Results  Labs (all labs ordered are listed, but only abnormal results are displayed) Labs Reviewed  COMPREHENSIVE METABOLIC PANEL - Abnormal; Notable for the following:       Result Value   Glucose, Bld 104 (*)    Calcium 8.8 (*)    Total Protein 5.9 (*)    Albumin 3.2 (*)    All other components within normal limits  CBC WITH DIFFERENTIAL/PLATELET  CK    EKG  EKG Interpretation None       Radiology No results found.  Procedures Procedures (including critical care time)  Medications Ordered in ED Medications  dexamethasone (DECADRON) injection 10 mg (10 mg Intramuscular Given 05/14/16 2232)  acetaminophen (TYLENOL) tablet 1,000 mg (1,000 mg Oral Given 05/14/16 2232)  HYDROmorphone (DILAUDID) injection 1 mg (1 mg Intravenous Given 05/14/16 2303)     Initial Impression / Assessment and Plan / ED Course  I have reviewed  the triage vital signs and the nursing notes.  Pertinent labs & imaging results that were available during my care of the patient were reviewed by me and considered in my medical decision making (see chart for details).  Clinical Course   Patient presents with worsening symptoms down her legs likely neuropathic in origin. No focal weakness. Patient's had this worsening for couple months and has follow-up for this. Reviewed recent MRI with degeneration no spinal stenosis. Patient will follow-up with her surgeon this week. Pain meds and steroids given the ER patient did have mild improvement reassessment.  Results and differential diagnosis were discussed with the patient/parent/guardian. Xrays were independently reviewed by myself.  Close follow up outpatient was discussed, comfortable with the plan.   Medications  dexamethasone (DECADRON) injection 10 mg (10 mg Intramuscular Given 05/14/16 2232)  acetaminophen (TYLENOL) tablet 1,000 mg (1,000 mg Oral Given  05/14/16 2232)  HYDROmorphone (DILAUDID) injection 1 mg (1 mg Intravenous Given 05/14/16 2303)    Vitals:   05/14/16 2047 05/14/16 2236 05/14/16 2245 05/14/16 2300  BP:  158/88 150/86 161/91  Pulse:  64 66 76  Resp:      Temp:      TempSrc:      SpO2:  99% 97% 98%  Weight: (!) 303 lb (137.4 kg)     Height: 5\' 5"  (1.651 m)       Final diagnoses:  Bilateral leg pain     Final Clinical Impressions(s) / ED Diagnoses   Final diagnoses:  Bilateral leg pain    New Prescriptions New Prescriptions   No medications on file     Elnora Morrison, MD 05/14/16 2319

## 2016-05-15 ENCOUNTER — Telehealth: Payer: Self-pay | Admitting: Neurology

## 2016-05-15 MED ORDER — ONDANSETRON 4 MG PO TBDP
8.0000 mg | ORAL_TABLET | Freq: Once | ORAL | Status: AC
  Administered 2016-05-15: 8 mg via ORAL
  Filled 2016-05-15: qty 2

## 2016-05-15 MED ORDER — HYDROCODONE-ACETAMINOPHEN 5-325 MG PO TABS: 1.0000 | ORAL_TABLET | ORAL | 0 refills | Status: DC | PRN

## 2016-05-15 NOTE — Telephone Encounter (Signed)
Patient said that she finally had to go to the ER because the pain in her legs was so bad.  They gave her a shot of demerol and a steroid shot which has helped with the burning but the doctor said that it would wear off in about 3 days.  Any recommendations as to what she could take?

## 2016-05-15 NOTE — Telephone Encounter (Signed)
Let's order MRI cervical and thoracic spine wwo contrast to see if there is nerve impingement at these levels causing her leg pain.    There is still room to go up on her gabapentin, please increase to 600mg  TID.  Kelsey Verge K. Posey Pronto, DO

## 2016-05-15 NOTE — Telephone Encounter (Signed)
Patient needs to talk to someone about her pain she is in please call [phone]

## 2016-05-15 NOTE — ED Notes (Signed)
Pt stable, ambulatory, states understanding of discharge instructions 

## 2016-05-16 ENCOUNTER — Other Ambulatory Visit: Payer: Self-pay | Admitting: *Deleted

## 2016-05-16 DIAGNOSIS — M25569 Pain in unspecified knee: Secondary | ICD-10-CM

## 2016-05-16 NOTE — Telephone Encounter (Signed)
I spoke with patient and informed her that I would refer her for the MRI's.  Instructed her to increase medication if she would like to.  She may increase the pm dose since it makes her sleepy.

## 2016-05-30 ENCOUNTER — Telehealth: Payer: Self-pay | Admitting: Neurology

## 2016-05-30 ENCOUNTER — Inpatient Hospital Stay: Admission: RE | Admit: 2016-05-30 | Payer: BLUE CROSS/BLUE SHIELD | Source: Ambulatory Visit

## 2016-05-30 NOTE — Telephone Encounter (Signed)
Patient states that she has a MRI sch today at 3:15 and [facility] states that we need to get a prior auth and the patient wants to know the status. They wioll cancel appt for today if they dont get it patient phone number is [phone]

## 2016-05-30 NOTE — Telephone Encounter (Signed)
Left message on machine letting patient know that Kelsey Santana has submitted all the information to her insurance and we are just waiting on the outcome. Nothing we can do to rush it at this point. Her MR may have to be rescheduled.

## 2016-06-15 ENCOUNTER — Telehealth: Payer: Self-pay | Admitting: Neurology

## 2016-06-15 NOTE — Telephone Encounter (Signed)
MRI cervical and thoracic spine denied by insurance.  Recommend that she set up return visit with me to reassess her symptoms and discuss other options.  Ireta Pullman K. Posey Pronto, DO

## 2016-06-16 NOTE — Telephone Encounter (Signed)
I spoke with patient and gave her recommendation per Dr. Posey Pronto.  Will you see if we can get her in sooner than her scheduled appointment please.

## 2016-07-27 ENCOUNTER — Ambulatory Visit: Payer: BLUE CROSS/BLUE SHIELD | Admitting: Neurology

## 2016-08-01 ENCOUNTER — Encounter: Payer: Self-pay | Admitting: Neurology

## 2017-01-02 ENCOUNTER — Encounter (HOSPITAL_COMMUNITY): Payer: Self-pay

## 2017-01-02 ENCOUNTER — Other Ambulatory Visit: Payer: Self-pay

## 2017-01-02 ENCOUNTER — Emergency Department (HOSPITAL_COMMUNITY): Payer: BLUE CROSS/BLUE SHIELD

## 2017-01-02 ENCOUNTER — Emergency Department (HOSPITAL_COMMUNITY)
Admission: EM | Admit: 2017-01-02 | Discharge: 2017-01-02 | Disposition: A | Discharge status: Home / Self Care | Payer: BLUE CROSS/BLUE SHIELD | Attending: Physician Assistant | Admitting: Physician Assistant

## 2017-01-02 ENCOUNTER — Ambulatory Visit (HOSPITAL_COMMUNITY)
Admission: EM | Admit: 2017-01-02 | Discharge: 2017-01-02 | Disposition: A | Discharge status: Home / Self Care | Payer: BLUE CROSS/BLUE SHIELD

## 2017-01-02 DIAGNOSIS — Z79899 Other long term (current) drug therapy: Secondary | ICD-10-CM | POA: Insufficient documentation

## 2017-01-02 DIAGNOSIS — R05 Cough: Secondary | ICD-10-CM | POA: Insufficient documentation

## 2017-01-02 DIAGNOSIS — R079 Chest pain, unspecified: Secondary | ICD-10-CM | POA: Diagnosis present

## 2017-01-02 DIAGNOSIS — Z87891 Personal history of nicotine dependence: Secondary | ICD-10-CM | POA: Diagnosis not present

## 2017-01-02 DIAGNOSIS — R0602 Shortness of breath: Secondary | ICD-10-CM | POA: Insufficient documentation

## 2017-01-02 DIAGNOSIS — I10 Essential (primary) hypertension: Secondary | ICD-10-CM | POA: Diagnosis not present

## 2017-01-02 DIAGNOSIS — R1013 Epigastric pain: Secondary | ICD-10-CM

## 2017-01-02 LAB — CBC
HEMATOCRIT: 41.5 % (ref 36.0–46.0)
Hemoglobin: 13.3 g/dL (ref 12.0–15.0)
MCH: 27.7 pg (ref 26.0–34.0)
MCHC: 32 g/dL (ref 30.0–36.0)
MCV: 86.5 fL (ref 78.0–100.0)
Platelets: 213 10*3/uL (ref 150–400)
RBC: 4.8 MIL/uL (ref 3.87–5.11)
RDW: 13.8 % (ref 11.5–15.5)
WBC: 6.4 10*3/uL (ref 4.0–10.5)

## 2017-01-02 LAB — BASIC METABOLIC PANEL
Anion gap: 7 (ref 5–15)
BUN: 9 mg/dL (ref 6–20)
CALCIUM: 8.2 mg/dL — AB (ref 8.9–10.3)
CO2: 26 mmol/L (ref 22–32)
Chloride: 108 mmol/L (ref 101–111)
Creatinine, Ser: 0.88 mg/dL (ref 0.44–1.00)
GFR calc Af Amer: >60 mL/min (ref >60)
GLUCOSE: 129 mg/dL — AB (ref 65–99)
Potassium: 3.8 mmol/L (ref 3.5–5.1)
Sodium: 141 mmol/L (ref 135–145)

## 2017-01-02 LAB — LIPASE, BLOOD: Lipase: 20 U/L (ref 11–51)

## 2017-01-02 LAB — I-STAT TROPONIN, ED
TROPONIN I, POC: 0 ng/mL (ref 0.00–0.08)
Troponin i, poc: 0 ng/mL (ref 0.00–0.08)

## 2017-01-02 MED ORDER — ALUM & MAG HYDROXIDE-SIMETH 200-200-20 MG/5ML PO SUSP: 15.0000 mL | Freq: Once | ORAL | Status: DC

## 2017-01-02 MED ORDER — PANTOPRAZOLE SODIUM 40 MG PO TBEC
40.0000 mg | DELAYED_RELEASE_TABLET | Freq: Once | ORAL | Status: AC
  Administered 2017-01-02: 40 mg via ORAL
  Filled 2017-01-02: qty 1

## 2017-01-02 MED ORDER — GI COCKTAIL ~~LOC~~
30.0000 mL | Freq: Once | ORAL | Status: AC
  Administered 2017-01-02: 30 mL via ORAL
  Filled 2017-01-02: qty 30

## 2017-01-02 MED ORDER — LIDOCAINE VISCOUS 2 % MT SOLN: 15.0000 mL | Freq: Once | OROMUCOSAL | Status: DC

## 2017-01-02 MED ORDER — PANTOPRAZOLE SODIUM 20 MG PO TBEC: 40.0000 mg | DELAYED_RELEASE_TABLET | Freq: Every day | ORAL | 0 refills | Status: DC

## 2017-01-02 NOTE — ED Provider Notes (Signed)
[facility] DEPT Provider Note   CSN: [id number] Arrival date & time: 01/02/17  1251     History   Chief Complaint Chief Complaint  Patient presents with  . Chest Pain    HPI Kelsey Santana is a 56 y.o. female.  Patient reports having intermittent chest pain over the last year. Has been seen by cardiologist for this. Had a nuclear stress test on the 11/2015. No evidence of inducible ischemia. Has been on Protonix 10 mg. Has had persistence of chest pain, primarily worsened after eating. Denies any diaphoresis. Endorses some mild nausea whenever she eats. Also endorses some intermittent shortness of breath. Cough as well. No fevers or chills. No other infectious symptoms.   The history is provided by the patient.  Illness  This is a new problem. Episode onset: almost a year. The problem occurs daily. The problem has not changed since onset.Associated symptoms include chest pain, abdominal pain (epigastric) and shortness of breath. Pertinent negatives include no headaches. The symptoms are aggravated by eating. Relieved by: protonix.    Past Medical History:  Diagnosis Date  . Hypercholesteremia   . Hypertension   . Migraine     Patient Active Problem List   Diagnosis Date Noted  . Chest pain 11/15/2015  . Depression 04/15/2012  . Morbid obesity due to excess calories ([place]) 04/15/2012  . Tinea corporis 04/15/2012  . Fatigue 04/15/2012  . Epigastric pain 03/19/2012  . Migraine headache 03/11/2012  . GERD (gastroesophageal reflux disease) 03/11/2012  . HTN (hypertension) 03/11/2012  . HLD (hyperlipidemia) 03/11/2012    Past Surgical History:  Procedure Laterality Date  . ABDOMINAL HYSTERECTOMY      OB History    No data available       Home Medications    Prior to Admission medications   Medication Sig Start Date End Date Taking? Authorizing Provider  atorvastatin (LIPITOR) 20 MG tablet Take 1 tablet (20 mg total) by mouth daily. 11/15/15  Yes Hilty,  Nadean Corwin, MD  benazepril (LOTENSIN) 20 MG tablet Take 20 mg by mouth daily.   Yes [provider]  cyclobenzaprine (FLEXERIL) 10 MG tablet Take 1 tablet (10 mg total) by mouth at bedtime. 12/28/15  Yes Willeen Niece, MD  DUEXIS 800-26.6 MG TABS Take 1 tablet by mouth 3 (three) times daily. 04/07/16  Yes [provider]  gabapentin (NEURONTIN) 100 MG capsule Take 1 capsule (100 mg total) by mouth daily. 04/27/16  Yes Patel, Donika K, DO  HYDROcodone-acetaminophen (NORCO) 5-325 MG tablet Take 1 tablet by mouth every 4 (four) hours as needed for moderate pain. 8/93/73  Yes Delora Fuel, MD  Multiple Vitamin (MULTIVITAMIN WITH MINERALS) TABS tablet Take 1 tablet by mouth daily.   Yes [provider]  gabapentin (NEURONTIN) 300 MG capsule Take 1 capsule (300 mg total) by mouth at bedtime. Patient not taking: Reported on 01/02/2017 04/17/16   Narda Amber K, DO  losartan (COZAAR) 50 MG tablet Take 1 tablet (50 mg total) by mouth daily. Patient not taking: Reported on 01/02/2017 12/20/15   Pixie Casino, MD  pantoprazole (PROTONIX) 20 MG tablet Take 2 tablets (40 mg total) by mouth daily. 01/02/17 02/01/17  Maryan Puls, MD    Family History Family History  Problem Relation Age of Onset  . Alcohol abuse Mother   . Diabetes Mother   . Hyperlipidemia Mother   . Hypertension Mother   . Diabetes Brother   . Hyperlipidemia Brother   . Hypertension Brother  Social History Social History  Substance Use Topics  . Smoking status: Former Research scientist (life sciences)  . Smokeless tobacco: Former Systems developer    Quit date: 03/11/1989  . Alcohol use No     Allergies   Ciprofloxacin; Hydrocortisone; and Ace inhibitors   Review of Systems Review of Systems  Constitutional: Negative for chills and fever.  Respiratory: Positive for cough and shortness of breath.   Cardiovascular: Positive for chest pain.  Gastrointestinal: Positive for abdominal pain (epigastric) and nausea. Negative for blood in  stool, diarrhea and vomiting.  Genitourinary: Negative for dysuria.  Neurological: Negative for light-headedness and headaches.  All other systems reviewed and are negative.    Physical Exam Updated Vital Signs BP (!) 132/91   Pulse 62   Temp 97.9 F (36.6 C) (Oral)   Resp 14   Ht 5\' 5"  (1.651 m)   Wt 136.1 kg   SpO2 99%   BMI 49.92 kg/m   Physical Exam  Constitutional: She appears well-developed and well-nourished. No distress.  HENT:  Head: Normocephalic and atraumatic.  Eyes: Conjunctivae are normal.  Neck: Neck supple.  Cardiovascular: Normal rate and regular rhythm.   No murmur heard. Pulmonary/Chest: Effort normal and breath sounds normal. No respiratory distress. She has no wheezes. She has no rhonchi.  Abdominal: Soft. There is generalized tenderness. There is no rebound, no guarding, no tenderness at McBurney's point and negative Murphy's sign.  Musculoskeletal: She exhibits no edema.  Neurological: She is alert.  Skin: Skin is warm and dry.  Psychiatric: She has a normal mood and affect.  Nursing note and vitals reviewed.    ED Treatments / Results  Labs (all labs ordered are listed, but only abnormal results are displayed) Labs Reviewed  BASIC METABOLIC PANEL - Abnormal; Notable for the following:       Result Value   Glucose, Bld 129 (*)    Calcium 8.2 (*)    All other components within normal limits  CBC  LIPASE, BLOOD  I-STAT TROPOININ, ED    EKG  EKG Interpretation  Date/Time:  Tuesday Jan 02 2017 12:57:09 EDT Ventricular Rate:  82 PR Interval:  136 QRS Duration: 68 QT Interval:  366 QTC Calculation: 427 R Axis:   44 Text Interpretation:  Normal sinus rhythm Normal ECG Normal sinus rhythm No significant change since last tracing Confirmed by Zenovia Jarred [phone]) on 01/02/2017 6:32:23 PM       Radiology Dg Chest 2 View  Result Date: 01/02/2017 CLINICAL DATA:  Worsening mid chest pain with tightness. EXAM: CHEST  2 VIEW  COMPARISON:  07/15/2014 FINDINGS: Normal heart size and mediastinal contours. Subtle rounded density over the upper right chest is best attributed EKG pad, which are seen anteriorly on the lateral view. No acute infiltrate or edema. No effusion or pneumothorax. No acute osseous findings. IMPRESSION: Negative chest. Electronically Signed   By: Monte Fantasia M.D.   On: 01/02/2017 13:58    Procedures Procedures (including critical care time)  Medications Ordered in ED Medications  pantoprazole (PROTONIX) EC tablet 40 mg (40 mg Oral Given 01/02/17 1933)  gi cocktail (Maalox,Lidocaine,Donnatal) (30 mLs Oral Given 01/02/17 1934)     Initial Impression / Assessment and Plan / ED Course  I have reviewed the triage vital signs and the nursing notes.  Pertinent labs & imaging results that were available during my care of the patient were reviewed by me and considered in my medical decision making (see chart for details).     Patient's symptoms likely  GI in nature. Has been going on for nearly a year. Worsened after eating. Gave the patient a GI cocktail here with improvement in symptoms. We'll give a prescription for Protonix 40 mg. Gave her a referral to call gastroenterology on an outpatient basis. She had a recent nuclear stress test 13 months ago that did not show any inducible ischemia. EKG here without ischemic changes or interval abnormalities. Delta troponin negative. Doubt ACS. Chest x-ray without evidence of pneumothorax or pneumonia. Mediastinum is narrow, description of symptoms not consistent with dissection. Electrolytes within normal limits. No leukocytosis. No anemia.  Final Clinical Impressions(s) / ED Diagnoses   Final diagnoses:  Dyspepsia    New Prescriptions New Prescriptions   PANTOPRAZOLE (PROTONIX) 20 MG TABLET    Take 2 tablets (40 mg total) by mouth daily.     Maryan Puls, MD 01/02/17 2002    Macarthur Critchley, MD 01/04/17 [phone]

## 2017-01-02 NOTE — ED Triage Notes (Signed)
Per Pt, Pt is coming from home with intermittent mid-center chest pain for three days. Pt reports recurrent from one month ago. Reports nausea, lightheadedness, and SOB. Worsens with eating.

## 2017-01-08 ENCOUNTER — Other Ambulatory Visit: Payer: Self-pay | Admitting: Gastroenterology

## 2017-01-10 ENCOUNTER — Encounter (HOSPITAL_COMMUNITY): Payer: Self-pay | Admitting: *Deleted

## 2017-01-16 ENCOUNTER — Encounter (HOSPITAL_COMMUNITY): Payer: Self-pay | Admitting: *Deleted

## 2017-01-16 ENCOUNTER — Ambulatory Visit (HOSPITAL_COMMUNITY): Payer: BLUE CROSS/BLUE SHIELD | Admitting: Anesthesiology

## 2017-01-16 ENCOUNTER — Ambulatory Visit (HOSPITAL_COMMUNITY)
Admission: RE | Admit: 2017-01-16 | Discharge: 2017-01-16 | Disposition: A | Discharge status: Home / Self Care | Payer: BLUE CROSS/BLUE SHIELD | Source: Ambulatory Visit | Attending: Gastroenterology | Admitting: Gastroenterology

## 2017-01-16 ENCOUNTER — Encounter (HOSPITAL_COMMUNITY)
Admission: RE | Disposition: A | Discharge status: Home / Self Care | Payer: Self-pay | Source: Ambulatory Visit | Attending: Gastroenterology

## 2017-01-16 DIAGNOSIS — Z79899 Other long term (current) drug therapy: Secondary | ICD-10-CM | POA: Insufficient documentation

## 2017-01-16 DIAGNOSIS — Z87891 Personal history of nicotine dependence: Secondary | ICD-10-CM | POA: Insufficient documentation

## 2017-01-16 DIAGNOSIS — I10 Essential (primary) hypertension: Secondary | ICD-10-CM | POA: Insufficient documentation

## 2017-01-16 DIAGNOSIS — K219 Gastro-esophageal reflux disease without esophagitis: Secondary | ICD-10-CM | POA: Diagnosis not present

## 2017-01-16 DIAGNOSIS — K5900 Constipation, unspecified: Secondary | ICD-10-CM | POA: Diagnosis not present

## 2017-01-16 DIAGNOSIS — R12 Heartburn: Secondary | ICD-10-CM | POA: Diagnosis present

## 2017-01-16 DIAGNOSIS — Z1211 Encounter for screening for malignant neoplasm of colon: Secondary | ICD-10-CM | POA: Insufficient documentation

## 2017-01-16 DIAGNOSIS — R1310 Dysphagia, unspecified: Secondary | ICD-10-CM | POA: Insufficient documentation

## 2017-01-16 DIAGNOSIS — D125 Benign neoplasm of sigmoid colon: Secondary | ICD-10-CM | POA: Insufficient documentation

## 2017-01-16 DIAGNOSIS — Z6841 Body Mass Index (BMI) 40.0 and over, adult: Secondary | ICD-10-CM | POA: Diagnosis not present

## 2017-01-16 DIAGNOSIS — K449 Diaphragmatic hernia without obstruction or gangrene: Secondary | ICD-10-CM | POA: Diagnosis not present

## 2017-01-16 DIAGNOSIS — Q438 Other specified congenital malformations of intestine: Secondary | ICD-10-CM | POA: Insufficient documentation

## 2017-01-16 DIAGNOSIS — E7800 Pure hypercholesterolemia, unspecified: Secondary | ICD-10-CM | POA: Insufficient documentation

## 2017-01-16 HISTORY — DX: Gastro-esophageal reflux disease without esophagitis: K21.9

## 2017-01-16 HISTORY — PX: ESOPHAGOGASTRODUODENOSCOPY: SHX5428

## 2017-01-16 HISTORY — PX: COLONOSCOPY: SHX5424

## 2017-01-16 HISTORY — DX: Prediabetes: R73.03

## 2017-01-16 HISTORY — DX: Dorsalgia, unspecified: M54.9

## 2017-01-16 SURGERY — EGD (ESOPHAGOGASTRODUODENOSCOPY)
Anesthesia: Monitor Anesthesia Care

## 2017-01-16 MED ORDER — LIDOCAINE 2% (20 MG/ML) 5 ML SYRINGE
INTRAMUSCULAR | Status: AC
  Filled 2017-01-16: qty 5

## 2017-01-16 MED ORDER — PROPOFOL 10 MG/ML IV BOLUS
INTRAVENOUS | Status: AC
  Filled 2017-01-16: qty 60

## 2017-01-16 MED ORDER — PROPOFOL 10 MG/ML IV BOLUS
INTRAVENOUS | Status: DC | PRN
  Administered 2017-01-16 (×2): 30 mg via INTRAVENOUS

## 2017-01-16 MED ORDER — SODIUM CHLORIDE 0.9 % IV SOLN: INTRAVENOUS | Status: DC

## 2017-01-16 MED ORDER — PROPOFOL 10 MG/ML IV BOLUS
INTRAVENOUS | Status: AC
  Filled 2017-01-16: qty 20

## 2017-01-16 MED ORDER — LACTATED RINGERS IV SOLN
INTRAVENOUS | Status: DC
  Administered 2017-01-16: 1000 mL via INTRAVENOUS

## 2017-01-16 MED ORDER — LIDOCAINE 2% (20 MG/ML) 5 ML SYRINGE
INTRAMUSCULAR | Status: DC | PRN
  Administered 2017-01-16: 60 mg via INTRAVENOUS

## 2017-01-16 MED ORDER — PROPOFOL 500 MG/50ML IV EMUL
INTRAVENOUS | Status: DC | PRN
  Administered 2017-01-16: 100 ug/kg/min via INTRAVENOUS

## 2017-01-16 NOTE — Anesthesia Procedure Notes (Signed)
Procedure Name: MAC Date/Time: 01/16/2017 1:29 PM Performed by: Dione Booze Pre-anesthesia Checklist: Patient identified, Suction available, Emergency Drugs available and Patient being monitored Patient Re-evaluated:Patient Re-evaluated prior to inductionOxygen Delivery Method: Nasal cannula Placement Confirmation: positive ETCO2

## 2017-01-16 NOTE — H&P (Signed)
Kelsey Santana HPI: The patient complains about abdominal pain that was worsened over the past week. PO intake will induce a chest pressure and there is a sensation of dysphagia in the epigastric region. She has a long history GERD that worsened over the past year and she was treated with Nexium and Protonix. Nexium did not seem to be of a benefit and then Protonix was initiated with some improvement. She was taking those medications in the evening before sleep. Last week she was in the ER for her complaints and a GI cocktail was beneficial. In fact, it was the only thing that helped her. Dexilant was prescribed and she took it in the daytime. Nausea is another symptoms, but no vomiting on a routine basis. Over the past couple of months she also reports problems with proctalgia. She is afraid to have a bowel movement and there is pain that radiates from the rectum up into the periumbilical region. Constipation is a problem for her and she will notice having thin stools as well has "hard balls". Many years ago, when she had abdominal issues, she underwent a colonoscopy.   Past Medical History:  Diagnosis Date  . 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 GERD (gastroesophageal reflux disease)   . Hypercholesteremia   . Hypertension   . Migraine   . Pre-diabetes     Past Surgical History:  Procedure Laterality Date  . ABDOMINAL HYSTERECTOMY     complete    Family History  Problem Relation Age of Onset  . Alcohol abuse Mother   . Diabetes Mother   . Hyperlipidemia Mother   . Hypertension Mother   . Diabetes Brother   . Hyperlipidemia Brother   . Hypertension Brother     Social History:  reports that she has quit smoking. She has quit using smokeless tobacco. She reports that she does not drink alcohol or use drugs.  Allergies:  Allergies  Allergen Reactions  . Ciprofloxacin     itching  . Hydrocortisone Nausea Only  . Ace Inhibitors Cough    Medications:   Scheduled:  Continuous: . sodium chloride    . lactated ringers 1,000 mL (01/16/17 1247)    No results found for this or any previous visit (from the past 24 hour(s)).   No results found.  ROS:  As stated above in the HPI otherwise negative.  Blood pressure (!) 147/88, temperature 97.6 F (36.4 C), temperature source Oral, resp. rate 14, SpO2 97 %.    PE: Gen: NAD, Alert and Oriented HEENT:  [place]/AT, EOMI Neck: Supple, no LAD Lungs: CTA Bilaterally CV: RRR without M/G/R ABM: Soft, NTND, +BS Ext: No C/C/E  Assessment/Plan: 1) Constipation. 2) Proctalgia. 3) GERD/Dysphagia 4) ABM pain.   She is better with Dexilant, but I will still pursue the EGD/Colonoscopy and possibly dilate with the EGD.  Ahriana Gunkel D 01/16/2017, 1:12 PM

## 2017-01-16 NOTE — Op Note (Signed)
[facility] Patient Name: Kelsey Santana Procedure Date: 01/16/2017 MRN: [id number] Attending MD: Carol Ada , MD Date of Birth: 1960/12/06 CSN: [id number] Age: 56 Admit Type: Outpatient Procedure:                Upper GI endoscopy Indications:              Heartburn Providers:                Carol Ada, MD, Zenon Mayo, RN, Alan Mulder,                            Technician, Corliss Parish, Technician Referring MD:              Medicines:                Propofol per Anesthesia Complications:            No immediate complications. Estimated Blood Loss:     Estimated blood loss: none. Procedure:                Pre-Anesthesia Assessment:                           - Prior to the procedure, a History and Physical                            was performed, and patient medications and                            allergies were reviewed. The patient's tolerance of                            previous anesthesia was also reviewed. The risks                            and benefits of the procedure and the sedation                            options and risks were discussed with the patient.                            All questions were answered, and informed consent                            was obtained. Prior Anticoagulants: The patient has                            taken no previous anticoagulant or antiplatelet                            agents. ASA Grade Assessment: III - A patient with                            severe systemic disease. After reviewing the risks  and benefits, the patient was deemed in                            satisfactory condition to undergo the procedure.                           - Sedation was administered by an anesthesia                            professional. Deep sedation was attained.                           After obtaining informed consent, the endoscope was                            passed under direct  vision. Throughout the                            procedure, the patient's blood pressure, pulse, and                            oxygen saturations were monitored continuously. The                            EC-3490LI ([id number]) scope was introduced through                            the mouth, and advanced to the second part of                            duodenum. The upper GI endoscopy was accomplished                            without difficulty. The patient tolerated the                            procedure well. Scope In: Scope Out: Findings:      A small hiatal hernia was present.      The stomach was normal.      The examined duodenum was normal. Impression:               - Small hiatal hernia.                           - Normal stomach.                           - Normal examined duodenum.                           - No specimens collected. Moderate Sedation:      N/A- Per Anesthesia Care Recommendation:           - Patient has a contact number available for  emergencies. The signs and symptoms of potential                            delayed complications were discussed with the                            patient. Return to normal activities tomorrow.                            Written discharge instructions were provided to the                            patient.                           - Resume previous diet.                           - Continue with Dexilant.                           Follow up in the office in one month.                           - Continue present medications. Procedure Code(s):        --- Professional ---                           [id number], Esophagogastroduodenoscopy, flexible,                            transoral; diagnostic, including collection of                            specimen(s) by brushing or washing, when performed                            (separate procedure) Diagnosis Code(s):        --- Professional ---                            K44.9, Diaphragmatic hernia without obstruction or                            gangrene                           R12, Heartburn CPT copyright 2016 American Medical Association. All rights reserved. The codes documented in this report are preliminary and upon coder review may  be revised to meet current compliance requirements. Carol Ada, MD Carol Ada, MD 01/16/2017 2:23:29 PM This report has been signed electronically. Number of Addenda: 0

## 2017-01-16 NOTE — Anesthesia Preprocedure Evaluation (Signed)
Anesthesia Evaluation  Patient identified by MRN, date of birth, ID band Patient awake    Reviewed: Allergy & Precautions, NPO status , Patient's Chart, lab work & pertinent test results  Airway Mallampati: II  TM Distance: >3 FB Neck ROM: Full    Dental no notable dental hx.    Pulmonary neg pulmonary ROS, former smoker,    Pulmonary exam normal breath sounds clear to auscultation       Cardiovascular hypertension, Normal cardiovascular exam Rhythm:Regular Rate:Normal     Neuro/Psych negative neurological ROS  negative psych ROS   GI/Hepatic Neg liver ROS, GERD  ,  Endo/Other  Morbid obesity  Renal/GU negative Renal ROS  negative genitourinary   Musculoskeletal negative musculoskeletal ROS (+)   Abdominal (+) + obese,   Peds negative pediatric ROS (+)  Hematology negative hematology ROS (+)   Anesthesia Other Findings   Reproductive/Obstetrics negative OB ROS                             Anesthesia Physical Anesthesia Plan  ASA: III  Anesthesia Plan: MAC   Post-op Pain Management:    Induction: Intravenous  Airway Management Planned: Nasal Cannula  Additional Equipment:   Intra-op Plan:   Post-operative Plan:   Informed Consent: I have reviewed the patients History and Physical, chart, labs and discussed the procedure including the risks, benefits and alternatives for the proposed anesthesia with the patient or authorized representative who has indicated his/her understanding and acceptance.   Dental advisory given  Plan Discussed with: CRNA and Surgeon  Anesthesia Plan Comments:         Anesthesia Quick Evaluation

## 2017-01-16 NOTE — Op Note (Signed)
[facility] Patient Name: Kelsey Santana Procedure Date: 01/16/2017 MRN: [id number] Attending MD: Carol Ada , MD Date of Birth: [birth year] CSN: [id number] Age: 56 Admit Type: Outpatient Procedure:                Colonoscopy Indications:              Screening for colorectal malignant neoplasm Providers:                Carol Ada, MD, Zenon Mayo, RN, Alan Mulder,                            Technician, Corliss Parish, Technician Referring MD:              Medicines:                Propofol per Anesthesia Complications:            No immediate complications. Estimated Blood Loss:     Estimated blood loss: none. Procedure:                Pre-Anesthesia Assessment:                           - Prior to the procedure, a History and Physical                            was performed, and patient medications and                            allergies were reviewed. The patient's tolerance of                            previous anesthesia was also reviewed. The risks                            and benefits of the procedure and the sedation                            options and risks were discussed with the patient.                            All questions were answered, and informed consent                            was obtained. Prior Anticoagulants: The patient has                            taken no previous anticoagulant or antiplatelet                            agents. ASA Grade Assessment: III - A patient with                            severe systemic disease. After reviewing the risks  and benefits, the patient was deemed in                            satisfactory condition to undergo the procedure.                           - Sedation was administered by an anesthesia                            professional. Deep sedation was attained.                           After obtaining informed consent, the colonoscope       was passed under direct vision. Throughout the                            procedure, the patient's blood pressure, pulse, and                            oxygen saturations were monitored continuously. The                            EC-3490LI ([id number]) scope was introduced through                            the anus and advanced to the the cecum, identified                            by appendiceal orifice and ileocecal valve. The                            colonoscopy was extremely difficult due to                            significant looping, a tortuous colon and the                            patient's body habitus. Successful completion of                            the procedure was aided by straightening and                            shortening the scope to obtain bowel loop reduction                            and applying abdominal pressure. The patient                            tolerated the procedure well. The quality of the                            bowel preparation was excellent. The ileocecal  valve, appendiceal orifice, and rectum were                            photographed. Scope In: 1:41:09 PM Scope Out: 2:12:29 PM Scope Withdrawal Time: 0 hours 16 minutes 47 seconds  Total Procedure Duration: 0 hours 31 minutes 20 seconds  Findings:      A 15 mm polyp was found in the sigmoid colon. The polyp was       pedunculated. The polyp was removed with a hot snare. Resection and       retrieval were complete. To prevent bleeding post-intervention, one       hemostatic clip was successfully placed (MR unsafe). There was no       bleeding at the end of the procedure.      The colon was extremely difficult to traverse. A hemoclip was required       as the nurse cut through the base of the polyp too quickly. Two       nonbleeding visible vessels were identified a the base of the       polypectomy with high risk of a post polypectomy bleed. The  hemoclip was       successfully placed across the base of the cautery site to prevent any       bleeding. Impression:               - One 15 mm polyp in the sigmoid colon, removed                            with a hot snare. Resected and retrieved. Clip (MR                            unsafe) was placed. Moderate Sedation:      N/A- Per Anesthesia Care Recommendation:           - Patient has a contact number available for                            emergencies. The signs and symptoms of potential                            delayed complications were discussed with the                            patient. Return to normal activities tomorrow.                            Written discharge instructions were provided to the                            patient.                           - Resume previous diet.                           - Continue present medications.                           -  Await pathology results.                           - Repeat colonoscopy in 3 years for surveillance. Procedure Code(s):        --- Professional ---                           [phone], Colonoscopy, flexible; with removal of                            tumor(s), polyp(s), or other lesion(s) by snare                            technique Diagnosis Code(s):        --- Professional ---                           Z12.11, Encounter for screening for malignant                            neoplasm of colon                           D12.5, Benign neoplasm of sigmoid colon CPT copyright 2016 American Medical Association. All rights reserved. The codes documented in this report are preliminary and upon coder review may  be revised to meet current compliance requirements. Carol Ada, MD Carol Ada, MD 01/16/2017 2:20:34 PM This report has been signed electronically. Number of Addenda: 0

## 2017-01-16 NOTE — Discharge Instructions (Signed)

## 2017-01-16 NOTE — Transfer of Care (Signed)
Immediate Anesthesia Transfer of Care Note  Patient: Kelsey Santana  Procedure(s) Performed: Procedure(s): ESOPHAGOGASTRODUODENOSCOPY (EGD) (N/A) COLONOSCOPY (N/A)  Patient Location: PACU and Endoscopy Unit  Anesthesia Type:MAC  Level of Consciousness: awake, alert , oriented and patient cooperative  Airway & Oxygen Therapy: Patient Spontanous Breathing and Patient connected to nasal cannula oxygen  Post-op Assessment: Report given to RN and Post -op Vital signs reviewed and stable  Post vital signs: Reviewed and stable  Last Vitals:  Vitals:   01/16/17 1232  BP: (!) 147/88  Resp: 14  Temp: 36.4 C    Last Pain:  Vitals:   01/16/17 1232  TempSrc: Oral         Complications: No apparent anesthesia complications

## 2017-01-16 NOTE — Anesthesia Postprocedure Evaluation (Signed)
Anesthesia Post Note  Patient: Kelsey Santana  Procedure(s) Performed: Procedure(s) (LRB): ESOPHAGOGASTRODUODENOSCOPY (EGD) (N/A) COLONOSCOPY (N/A)  Patient location during evaluation: PACU Anesthesia Type: MAC Level of consciousness: awake and alert Pain management: pain level controlled Vital Signs Assessment: post-procedure vital signs reviewed and stable Respiratory status: spontaneous breathing, nonlabored ventilation, respiratory function stable and patient connected to nasal cannula oxygen Cardiovascular status: stable and blood pressure returned to baseline Anesthetic complications: no       Last Vitals:  Vitals:   01/16/17 1232  BP: (!) 147/88  Resp: 14  Temp: 36.4 C    Last Pain:  Vitals:   01/16/17 1232  TempSrc: Oral                 Kemia Wendel S

## 2017-01-18 ENCOUNTER — Encounter (HOSPITAL_COMMUNITY): Payer: Self-pay | Admitting: Gastroenterology

## 2017-01-22 NOTE — Anesthesia Postprocedure Evaluation (Signed)
Anesthesia Post Note  Patient: Kelsey Santana  Procedure(s) Performed: Procedure(s) (LRB): ESOPHAGOGASTRODUODENOSCOPY (EGD) (N/A) COLONOSCOPY (N/A)     Anesthesia Post Evaluation  Last Vitals:  Vitals:   01/16/17 1436 01/16/17 1440  BP:  135/74  Pulse:  62  Resp: 20 20  Temp:      Last Pain:  Vitals:   01/17/17 1234  TempSrc:   PainSc: 0-No pain                 [name] S

## 2017-01-22 NOTE — Addendum Note (Signed)
Addendum  created 01/22/17 1455 by Myrtie Soman, MD   Sign clinical note

## 2017-04-11 ENCOUNTER — Emergency Department (HOSPITAL_COMMUNITY)
Admission: EM | Admit: 2017-04-11 | Discharge: 2017-04-11 | Disposition: A | Discharge status: Home / Self Care | Payer: BLUE CROSS/BLUE SHIELD | Attending: Emergency Medicine | Admitting: Emergency Medicine

## 2017-04-11 ENCOUNTER — Emergency Department (HOSPITAL_COMMUNITY): Payer: BLUE CROSS/BLUE SHIELD

## 2017-04-11 ENCOUNTER — Encounter (HOSPITAL_COMMUNITY): Payer: Self-pay | Admitting: *Deleted

## 2017-04-11 DIAGNOSIS — Z87891 Personal history of nicotine dependence: Secondary | ICD-10-CM | POA: Insufficient documentation

## 2017-04-11 DIAGNOSIS — I10 Essential (primary) hypertension: Secondary | ICD-10-CM | POA: Insufficient documentation

## 2017-04-11 DIAGNOSIS — H547 Unspecified visual loss: Secondary | ICD-10-CM | POA: Insufficient documentation

## 2017-04-11 DIAGNOSIS — R200 Anesthesia of skin: Secondary | ICD-10-CM | POA: Diagnosis not present

## 2017-04-11 DIAGNOSIS — R519 Headache, unspecified: Secondary | ICD-10-CM

## 2017-04-11 DIAGNOSIS — Z79899 Other long term (current) drug therapy: Secondary | ICD-10-CM | POA: Insufficient documentation

## 2017-04-11 DIAGNOSIS — R51 Headache: Secondary | ICD-10-CM | POA: Insufficient documentation

## 2017-04-11 DIAGNOSIS — R531 Weakness: Secondary | ICD-10-CM | POA: Insufficient documentation

## 2017-04-11 DIAGNOSIS — R110 Nausea: Secondary | ICD-10-CM | POA: Diagnosis not present

## 2017-04-11 LAB — BASIC METABOLIC PANEL
Anion gap: 7 (ref 5–15)
BUN: 8 mg/dL (ref 6–20)
CHLORIDE: 104 mmol/L (ref 101–111)
CO2: 29 mmol/L (ref 22–32)
Calcium: 8.8 mg/dL — ABNORMAL LOW (ref 8.9–10.3)
Creatinine, Ser: 0.79 mg/dL (ref 0.44–1.00)
GFR calc Af Amer: >60 mL/min (ref >60)
GFR calc non Af Amer: >60 mL/min (ref >60)
Glucose, Bld: 149 mg/dL — ABNORMAL HIGH (ref 65–99)
POTASSIUM: 3.7 mmol/L (ref 3.5–5.1)
SODIUM: 140 mmol/L (ref 135–145)

## 2017-04-11 LAB — CBC
HCT: 40.2 % (ref 36.0–46.0)
Hemoglobin: 12.9 g/dL (ref 12.0–15.0)
MCH: 27.8 pg (ref 26.0–34.0)
MCHC: 32.1 g/dL (ref 30.0–36.0)
MCV: 86.6 fL (ref 78.0–100.0)
Platelets: 220 10*3/uL (ref 150–400)
RBC: 4.64 MIL/uL (ref 3.87–5.11)
RDW: 14.3 % (ref 11.5–15.5)
WBC: 6.2 10*3/uL (ref 4.0–10.5)

## 2017-04-11 LAB — I-STAT TROPONIN, ED: Troponin i, poc: 0 ng/mL (ref 0.00–0.08)

## 2017-04-11 MED ORDER — KETOROLAC TROMETHAMINE 30 MG/ML IJ SOLN
30.0000 mg | Freq: Once | INTRAMUSCULAR | Status: AC
  Administered 2017-04-11: 30 mg via INTRAVENOUS
  Filled 2017-04-11: qty 1

## 2017-04-11 MED ORDER — METOCLOPRAMIDE HCL 5 MG/ML IJ SOLN
10.0000 mg | Freq: Once | INTRAMUSCULAR | Status: AC
  Administered 2017-04-11: 10 mg via INTRAVENOUS
  Filled 2017-04-11: qty 2

## 2017-04-11 MED ORDER — TRAMADOL HCL 50 MG PO TABS: 50.0000 mg | ORAL_TABLET | Freq: Four times a day (QID) | ORAL | 0 refills | Status: DC | PRN

## 2017-04-11 MED ORDER — SODIUM CHLORIDE 0.9 % IV BOLUS (SEPSIS)
1000.0000 mL | Freq: Once | INTRAVENOUS | Status: AC
  Administered 2017-04-11: 1000 mL via INTRAVENOUS

## 2017-04-11 MED ORDER — DIPHENHYDRAMINE HCL 50 MG/ML IJ SOLN
25.0000 mg | Freq: Once | INTRAMUSCULAR | Status: AC
  Administered 2017-04-11: 25 mg via INTRAVENOUS
  Filled 2017-04-11: qty 1

## 2017-04-11 NOTE — ED Notes (Signed)
Doctor at bedside.

## 2017-04-11 NOTE — ED Provider Notes (Signed)
[facility] DEPT Provider Note   CSN: [id number] Arrival date & time: 04/11/17  7564     History   Chief Complaint Chief Complaint  Patient presents with  . Headache  . Loss of Vision    HPI Kelsey Santana is a 56 y.o. female.  Patient is a 56 year old female with history of morbid obesity, hypertension, migraines. She presents today for evaluation of headaches that have been occurring intermittently for the past 2 months. She reports pain throughout her head with associated loss of vision, tensing up of her arms and legs, nausea, and generally feeling bad. She also reports episodes of urinary incontinence. She denies any fevers or chills. She denies any injury or trauma. She has a history of migraine headaches, however these episodes feel different.  She was at work this morning staring at her computer screen when she experienced another episode and comes to be evaluated. She has seen her primary Dr. and a neurologist, however no cause has been found.   The history is provided by the patient.  Headache   This is a new problem. Episode onset: 2 months ago. Episode frequency: Daily. The problem has been gradually worsening. The headache is associated with bright light. The quality of the pain is described as throbbing. The pain is moderate. The pain does not radiate. Pertinent negatives include no anorexia, no fever, no malaise/fatigue and no palpitations.    Past Medical History:  Diagnosis Date  . 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 GERD (gastroesophageal reflux disease)   . Hypercholesteremia   . Hypertension   . Migraine   . Pre-diabetes     Patient Active Problem List   Diagnosis Date Noted  . Chest pain 11/15/2015  . Depression 04/15/2012  . Morbid obesity due to excess calories ([place]) 04/15/2012  . Tinea corporis 04/15/2012  . Fatigue 04/15/2012  . Epigastric pain 03/19/2012  . Migraine headache 03/11/2012  . GERD (gastroesophageal reflux disease) 03/11/2012    . HTN (hypertension) 03/11/2012  . HLD (hyperlipidemia) 03/11/2012    Past Surgical History:  Procedure Laterality Date  . ABDOMINAL HYSTERECTOMY     complete  . COLONOSCOPY N/A 01/16/2017   Procedure: COLONOSCOPY;  Surgeon: Carol Ada, MD;  Location: [facility] ENDOSCOPY;  Service: Endoscopy;  Laterality: N/A;  . ESOPHAGOGASTRODUODENOSCOPY N/A 01/16/2017   Procedure: ESOPHAGOGASTRODUODENOSCOPY (EGD);  Surgeon: Carol Ada, MD;  Location: [facility];  Service: Endoscopy;  Laterality: N/A;    OB History    No data available       Home Medications    Prior to Admission medications   Medication Sig Start Date End Date Taking? Authorizing Provider  amLODipine (NORVASC) 5 MG tablet Take 5 mg by mouth at bedtime.   Yes [provider]  atorvastatin (LIPITOR) 20 MG tablet Take 1 tablet (20 mg total) by mouth daily. Patient taking differently: Take 20 mg by mouth at bedtime.  11/15/15  Yes Hilty, Nadean Corwin, MD  pantoprazole (PROTONIX) 20 MG tablet Take 2 tablets (40 mg total) by mouth daily. Patient taking differently: Take 20 mg by mouth daily.  01/02/17 04/11/17 Yes Maryan Puls, MD  cyclobenzaprine (FLEXERIL) 10 MG tablet Take 1 tablet (10 mg total) by mouth at bedtime. Patient not taking: Reported on 04/11/2017 12/28/15   Willeen Niece, MD    Family History Family History  Problem Relation Age of Onset  . Alcohol abuse Mother   . Diabetes Mother   . Hyperlipidemia Mother   .  Hypertension Mother   . Diabetes Brother   . Hyperlipidemia Brother   . Hypertension Brother     Social History Social History  Substance Use Topics  . Smoking status: Former Research scientist (life sciences)  . Smokeless tobacco: Former Systems developer     Comment: quit 28 yrs ago  . Alcohol use No     Allergies   Ciprofloxacin; Hydrocortisone; and Ace inhibitors   Review of Systems Review of Systems  Constitutional: Negative for fever and malaise/fatigue.  Cardiovascular: Negative for palpitations.  Gastrointestinal:  Negative for anorexia.  Neurological: Positive for headaches.  All other systems reviewed and are negative.    Physical Exam Updated Vital Signs BP (!) 119/91 (BP Location: Left Arm)   Pulse 65   Temp 98.3 F (36.8 C) (Oral)   Resp 20   Ht 5\' 5"  (1.651 m)   Wt (!) 142.9 kg (315 lb)   SpO2 94%   BMI 52.42 kg/m   Physical Exam  Constitutional: She is oriented to person, place, and time. She appears well-developed and well-nourished. No distress.  HENT:  Head: Normocephalic and atraumatic.  Mouth/Throat: Oropharynx is clear and moist.  Eyes: Pupils are equal, round, and reactive to light. EOM are normal.  Neck: Normal range of motion. Neck supple.  Cardiovascular: Normal rate and regular rhythm.  Exam reveals no gallop and no friction rub.   No murmur heard. Pulmonary/Chest: Effort normal and breath sounds normal. No respiratory distress. She has no wheezes.  Abdominal: Soft. Bowel sounds are normal. She exhibits no distension. There is no tenderness.  Musculoskeletal: Normal range of motion.  Lymphadenopathy:    She has no cervical adenopathy.  Neurological: She is alert and oriented to person, place, and time. No cranial nerve deficit. She exhibits normal muscle tone. Coordination normal.  Skin: Skin is warm and dry. She is not diaphoretic.  Nursing note and vitals reviewed.    ED Treatments / Results  Labs (all labs ordered are listed, but only abnormal results are displayed) Labs Reviewed  BASIC METABOLIC PANEL  CBC  URINALYSIS, ROUTINE W REFLEX MICROSCOPIC  I-STAT TROPONIN, ED  I-STAT TROPONIN, ED    EKG  EKG Interpretation  Date/Time:  Wednesday April 11 2017 10:55:28 EDT Ventricular Rate:  64 PR Interval:    QRS Duration: 78 QT Interval:  401 QTC Calculation: 414 R Axis:   45 Text Interpretation:  Sinus rhythm Normal ECG Confirmed by Veryl Speak [id number]) on 04/11/2017 11:02:34 AM       Radiology No results found.  Procedures Procedures  (including critical care time)  Medications Ordered in ED Medications  sodium chloride 0.9 % bolus 1,000 mL (not administered)  metoCLOPramide (REGLAN) injection 10 mg (not administered)  diphenhydrAMINE (BENADRYL) injection 25 mg (not administered)  ketorolac (TORADOL) 30 MG/ML injection 30 mg (not administered)     Initial Impression / Assessment and Plan / ED Course  I have reviewed the triage vital signs and the nursing notes.  Pertinent labs & imaging results that were available during my care of the patient were reviewed by me and considered in my medical decision making (see chart for details).  Patient presents with a wide array of complaints including headache, visual disturbances, numbness in her extremities, and weakness. This is been occurring intermittently for the past week. She is neurologically intact and laboratory studies are reassuring. An MRI was obtained today which reveals no evidence for stroke or white matter lesions. She will be discharged, to follow-up with her primary Dr. or neurologist.  Final Clinical Impressions(s) / ED Diagnoses   Final diagnoses:  None    New Prescriptions New Prescriptions   No medications on file     Veryl Speak, MD 04/11/17 1424

## 2017-04-11 NOTE — ED Notes (Signed)
Patient returned from MRI.

## 2017-04-11 NOTE — ED Triage Notes (Addendum)
Pt states headaches with blurred vision, cramping and pain in all extremities. Also c/o weakness and urine incontinence. Has been seen by her pcp and neurologist.

## 2017-04-11 NOTE — ED Notes (Signed)
Called x-ray patient stated patient transported to MRI. Called MRI confirmed patient in MRI.

## 2017-04-11 NOTE — Discharge Instructions (Signed)
Follow-up with your primary Dr. or neurologist if not improving in the next week.  Tramadol as prescribed as needed for pain.

## 2017-04-11 NOTE — ED Notes (Signed)
Patient still in MRI.  

## 2017-04-12 ENCOUNTER — Telehealth: Payer: Self-pay | Admitting: Neurology

## 2017-04-12 NOTE — Telephone Encounter (Signed)
Spoke w/Pt, she was advsd to see either her PCP or Neurologist after being seen in ED yesterday for headache and several other complaints. Was given Toradal and benadryl injection, which she states did not totally relieve the pain, but reduced it to about a three (3) pain level. Had MRI, reported normal. Pt is using tramadol w/out relief. Do you want her seen sooner then next available?

## 2017-04-12 NOTE — Telephone Encounter (Signed)
PT called and said she is having really bad headaches, said she has been to the ER and they suggested she see her neurologist, please advise

## 2017-04-12 NOTE — Telephone Encounter (Signed)
Next available follow-up.  If she needs to be sooner, she should follow-up with her PCP.  Indiya Izquierdo K. Posey Pronto, DO

## 2017-04-12 NOTE — Telephone Encounter (Signed)
Spoke with Pt, she has an appt with Dr Brett Albino on 04/16/17 to establish care, will keep that appt and call us if needed.

## 2017-04-16 ENCOUNTER — Ambulatory Visit: Payer: BLUE CROSS/BLUE SHIELD | Admitting: Internal Medicine

## 2017-04-28 ENCOUNTER — Other Ambulatory Visit: Payer: Self-pay | Admitting: Neurology

## 2017-04-30 ENCOUNTER — Ambulatory Visit: Payer: BLUE CROSS/BLUE SHIELD | Admitting: Internal Medicine

## 2017-05-14 ENCOUNTER — Other Ambulatory Visit (INDEPENDENT_AMBULATORY_CARE_PROVIDER_SITE_OTHER): Payer: BLUE CROSS/BLUE SHIELD

## 2017-05-14 ENCOUNTER — Ambulatory Visit (INDEPENDENT_AMBULATORY_CARE_PROVIDER_SITE_OTHER): Payer: BLUE CROSS/BLUE SHIELD | Admitting: Neurology

## 2017-05-14 ENCOUNTER — Encounter: Payer: Self-pay | Admitting: Neurology

## 2017-05-14 VITALS — BP 110/70 | HR 61 | Ht 64.0 in | Wt 312.1 lb

## 2017-05-14 DIAGNOSIS — R252 Cramp and spasm: Secondary | ICD-10-CM | POA: Diagnosis not present

## 2017-05-14 DIAGNOSIS — R292 Abnormal reflex: Secondary | ICD-10-CM

## 2017-05-14 DIAGNOSIS — M79601 Pain in right arm: Secondary | ICD-10-CM | POA: Diagnosis not present

## 2017-05-14 DIAGNOSIS — M79602 Pain in left arm: Secondary | ICD-10-CM | POA: Diagnosis not present

## 2017-05-14 LAB — COMPREHENSIVE METABOLIC PANEL
ALBUMIN: 3.4 g/dL — AB (ref 3.5–5.2)
ALK PHOS: 67 U/L (ref 39–117)
ALT: 19 U/L (ref 0–35)
AST: 19 U/L (ref 0–37)
BUN: 11 mg/dL (ref 6–23)
CO2: 30 mEq/L (ref 19–32)
CREATININE: 0.76 mg/dL (ref 0.40–1.20)
Calcium: 8.3 mg/dL — ABNORMAL LOW (ref 8.4–10.5)
Chloride: 110 mEq/L (ref 96–112)
GFR: 100.83 mL/min (ref >60.00)
Glucose, Bld: 126 mg/dL — ABNORMAL HIGH (ref 70–99)
Potassium: 4.1 mEq/L (ref 3.5–5.1)
SODIUM: 146 meq/L — AB (ref 135–145)
Total Bilirubin: 0.4 mg/dL (ref 0.2–1.2)
Total Protein: 5.8 g/dL — ABNORMAL LOW (ref 6.0–8.3)

## 2017-05-14 LAB — TSH: TSH: 0.89 u[IU]/mL (ref 0.35–4.50)

## 2017-05-14 LAB — MAGNESIUM: Magnesium: 1.9 mg/dL (ref 1.5–2.5)

## 2017-05-14 MED ORDER — SUMATRIPTAN SUCCINATE 50 MG PO TABS
50.0000 mg | ORAL_TABLET | ORAL | 3 refills | Status: DC | PRN
Start: 2017-05-14 — End: 2021-09-23

## 2017-05-14 MED ORDER — CYCLOBENZAPRINE HCL 5 MG PO TABS: 5.0000 mg | ORAL_TABLET | Freq: Every evening | ORAL | 5 refills | Status: DC | PRN

## 2017-05-14 NOTE — Addendum Note (Signed)
Addended by: Chester Holstein on: 05/14/2017 11:09 AM   Modules accepted: Orders

## 2017-05-14 NOTE — Progress Notes (Signed)
Follow-up Visit   Date: 05/14/17    Kelsey Santana MRN: [id number] DOB: 1960/12/28   Interim History: Kelsey Santana is a 56 y.o. right-handed African American female with hyperlipidemia, hypertension, GERD, episodic migraine, former tobacco use, and chronic low back pain returning to the clinic for follow-up of bilateral hand cramps.  The patient was accompanied to the clinic by self.  History of present illness: Initial visit 03/2016 bilateral leg pain:  She complains of numbness and tingling sensation over the lateral thigh which radiates into her lower legs and heel started in the summer 2016.  She now has burning sensation.  She denies wearing tight belt or constrictive clothing.  She has always been overweight and has been trying to go to the gym to loose her weight.  Last year, she was walking 4 miles per day and using a stationary bike, but since her new leg pain has started, she stopped going to the gym.  She also has spells of bilateral entire leg numbness which is worse with standing and improved by rest.  Three weeks ago, she developed severe shooting pain from her leg which radiated down her leg, into her heel for which she went to the ER.  She had plain films which was normal and discharged with vicodin on crutches.  She also reports having gait instability and falls.  One fall occurred because of severe pain in the left leg as if someone was tearing something inside her leg and the other occurred while stepping out to pump gas and her right leg simply buckled and went out.  She required assistance to stand up.  She has throbbing pain in her knees which is worse when walking.  She has tenderness around her knees with palpation.    She has been getting ESI for low back pain with Dr. Nelva Bush which has helped low back pain, but provided no relief to her burning thigh pain or bilateral leg numbness.  MRI lumbar spine from May 2017 does not show any neural impingement. No  shooting pain down her neck or urinary/bowel incontinence.  NCS/EMG of the legs performed in August 2017 was normal.   UPDATE 05/14/2017:  She is here for new complaints of painful cramping involving the hands and feet. It can occur sporadically every few weeks.  It can last about 10-minutes.  She denies any weakness.  She has noticed that seafood can make it worse, but it can occur with anything.  She has stiffness with walking.  She is staying well-hydrated. Exercise or cold temperature do not trigger cramps. Her maternal aunt had cramps. She was started tizanidine about a month but this only made her sleepy and did not help with cramps.  She took one yesterday and feel asleep at church, which worried bystanders.  Last week, she feel asleep at work and was terminated  She also complains of migraines, with stabbing pain over the temples.  She has nausea/vomiting, photophobia, and phonophobia. Sometimes, she has blurry vision. Over the past two months, she had 15 migraines.  They last about 2-3 days.  She has tried NSAIDs and tylenol. These are similar to what she used to experience these in 2004 and was treated with trigger point injections.  She went to the ER where MRI brain did not show any acute changes.     Medications:  Current Outpatient Prescriptions on File Prior to Visit  Medication Sig Dispense Refill  . amLODipine (NORVASC) 5 MG tablet Take 5  mg by mouth at bedtime.    Marland Kitchen atorvastatin (LIPITOR) 20 MG tablet Take 1 tablet (20 mg total) by mouth daily. (Patient taking differently: Take 20 mg by mouth at bedtime. ) 30 tablet 6  . traMADol (ULTRAM) 50 MG tablet Take 1 tablet (50 mg total) by mouth every 6 (six) hours as needed. 10 tablet 0  . pantoprazole (PROTONIX) 20 MG tablet Take 2 tablets (40 mg total) by mouth daily. (Patient taking differently: Take 20 mg by mouth daily. ) 60 tablet 0   No current facility-administered medications on file prior to visit.     Allergies:  Allergies    Allergen Reactions  . Ciprofloxacin Itching    itching  . Hydrocortisone Nausea Only  . Ace Inhibitors Cough    Review of Systems:  CONSTITUTIONAL: No fevers, chills, night sweats, or weight loss.  EYES: No visual changes or eye pain ENT: No hearing changes.  No history of nose bleeds.   RESPIRATORY: No cough, wheezing and shortness of breath.   CARDIOVASCULAR: Negative for chest pain, and palpitations.   GI: Negative for abdominal discomfort, blood in stools or black stools.  No recent change in bowel habits.   GU:  No history of incontinence.   MUSCLOSKELETAL: No history of joint pain or swelling.  No myalgias.   SKIN: Negative for lesions, rash, and itching.   ENDOCRINE: Negative for cold or heat intolerance, polydipsia or goiter.   PSYCH:  No depression or anxiety symptoms.   NEURO: As Above.   Vital Signs:  BP 110/70   Pulse 61   Ht 5\' 4"  (1.626 m)   Wt (!) 312 lb 1 oz (141.6 kg)   SpO2 95%   BMI 53.57 kg/m    General: Well appearing, comfortable  Neurological Exam: MENTAL STATUS including orientation to time, place, person, recent and remote memory, attention span and concentration, language, and fund of knowledge is normal.  Speech is not dysarthric.  CRANIAL NERVES:  Pupils equal round and reactive to light.  Normal conjugate, extra-ocular eye movements in all directions of gaze.  No ptosis.   Face is symmetric. Palate elevates symmetrically.  Tongue is midline.  MOTOR:  Motor strength is 5/5 in all extremities.  There is no grip myotonia or percussion myotonia.  No atrophy, fasciculations or abnormal movements.  No pronator drift.  Tone is normal.    MSRs:  Reflexes are 3+/4 throughout, except 2+/4 Achilles.  Plantars are down going.  SENSORY:  Intact to vibration throughout.  COORDINATION/GAIT:  Gait wide-based due to body habitus, stable and unassisted.  Data: MRI lumbar spine 01/15/2016:   1.  L5-S1:  Moderate facet DJD and mild disc osteophyte complex  without stenosis 2.  L4-L5:  Severe facet DJD without stenosis 3.  L3-L4:  Disc degeneration without stenosis  MRI brain wo contrast 04/11/2017:  No cause of the presenting symptoms is identified. Few scattered foci of T2 and FLAIR signal within the hemispheric white matter consistent with chronic small vessel change. 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  NCS/EMG of the legs 04/18/2016:  Normal  Lab Results  Component Value Date   CKTOTAL 129 05/14/2016     IMPRESSION/PLAN: 1.  Painful muscle cramps, possibly due to hypocalcemia?. There is no evidence of myotonia on exam and CK is normal.  Reflexes are brisk in the upper extremities and she could have cervical canal stenosis, so MRI cervical spine will be ordered.  - check PTH, TSH, Mg, CMP, vitamin E  -  Start magnesium oxide 400-600mg  daily  - D/C tizanidine due to sedation  - Start flexeril 5mg  at bedtime  - MRI cervical spine wo contrast  - If work-up is negative, may offer a statin drug holiday, but she has been on this for many years and it would be unusual to develop myalgias and cramps from this now  2.  Episodic migraines with visual changes  - Start imitrex 50mg  as needed for severe migraine  3.  Left meralgia paresthetica  - Continue gabapentin 300mg  at bedtime  Return to clinic in 3 months  Greater than 50% of this 40 minute visit was spent in counseling, explanation of diagnosis, planning of further management, and coordination of care due to the number of complaints today.    Thank you for allowing me to participate in patient's care.  If I can answer any additional questions, I would be pleased to do so.    Sincerely,    Donika K. Posey Pronto, DO

## 2017-05-14 NOTE — Patient Instructions (Addendum)
1. Check labs 2.  MRI cervical spine wo contrast 3.  Stop tizanidine and start flexeril 5mg  at bedtime 4.  You can also try magnesium oxide 400-600mg  daily for your cramps, which is an over the counter supplement 5.  Stay well hydrated  6.  For your migraines, start imitrex 50mg  at headache onset.  Ok to repeat in 2 hours, if no relief.  Limit to twice per week  Return to clinic in 3 months

## 2017-05-19 LAB — PARATHYROID HORMONE, INTACT (NO CA): PTH: 217 pg/mL — ABNORMAL HIGH (ref 14–64)

## 2017-05-19 LAB — VITAMIN E
GAMMA-TOCOPHEROL (VIT E): 2.7 mg/L (ref <=4.3)
VITAMIN E (ALPHA TOCOPHEROL): 11.9 mg/L (ref 5.7–19.9)

## 2017-05-21 ENCOUNTER — Telehealth: Payer: Self-pay | Admitting: *Deleted

## 2017-05-21 NOTE — Telephone Encounter (Signed)
Attempted to contact patient. No answer and no vm.

## 2017-05-21 NOTE — Telephone Encounter (Signed)
-----   Message from Alda Berthold, DO sent at 05/18/2017 12:19 PM EDT ----- Please inform patient that her labs continue to showed low calcium, which is most likely causing cramps. To better understand why calcium levels are staying low, I will refer her to endocrinology.  In the meantime, start calcium carbonate 600mg  daily.

## 2017-05-22 ENCOUNTER — Telehealth: Payer: Self-pay | Admitting: *Deleted

## 2017-05-22 NOTE — Telephone Encounter (Signed)
-----   Message from Alda Berthold, DO sent at 05/18/2017 12:19 PM EDT ----- Please inform patient that her labs continue to showed low calcium, which is most likely causing cramps. To better understand why calcium levels are staying low, I will refer her to endocrinology.  In the meantime, start calcium carbonate 600mg  daily.

## 2017-05-22 NOTE — Telephone Encounter (Signed)
Attempted to contact patient again. No answer and no voicemail.

## 2017-05-22 NOTE — Telephone Encounter (Signed)
Called patient.  No answer and no voicemail.  Will try again later.

## 2017-05-22 NOTE — Telephone Encounter (Signed)
-----   Message from Kelsey Berthold, DO sent at 05/18/2017 12:19 PM EDT ----- Please inform patient that her labs continue to showed low calcium, which is most likely causing cramps. To better understand why calcium levels are staying low, I will refer her to endocrinology.  In the meantime, start calcium carbonate 600mg  daily.

## 2017-05-23 ENCOUNTER — Telehealth: Payer: Self-pay | Admitting: *Deleted

## 2017-05-23 NOTE — Telephone Encounter (Signed)
-----   Message from Alda Berthold, DO sent at 05/18/2017 12:19 PM EDT ----- Please inform patient that her labs continue to showed low calcium, which is most likely causing cramps. To better understand why calcium levels are staying low, I will refer her to endocrinology.  In the meantime, start calcium carbonate 600mg  daily.

## 2017-05-23 NOTE — Telephone Encounter (Signed)
Since I have been unable to reach patient, I left a VM on her daughter's phone requesting for her to have the patient call me back.

## 2017-05-25 ENCOUNTER — Other Ambulatory Visit: Payer: Self-pay | Admitting: Neurology

## 2017-05-25 NOTE — Telephone Encounter (Signed)
Not mentioned in last note. Please advise on refill.  

## 2017-05-25 NOTE — Telephone Encounter (Signed)
OK to refill for meralgia paresthetica.

## 2017-05-29 ENCOUNTER — Other Ambulatory Visit: Payer: Self-pay | Admitting: *Deleted

## 2017-05-29 ENCOUNTER — Ambulatory Visit
Admission: RE | Admit: 2017-05-29 | Discharge: 2017-05-29 | Disposition: A | Discharge status: Home / Self Care | Payer: BLUE CROSS/BLUE SHIELD | Source: Ambulatory Visit | Attending: Neurology | Admitting: Neurology

## 2017-05-29 ENCOUNTER — Telehealth: Payer: Self-pay | Admitting: *Deleted

## 2017-05-29 ENCOUNTER — Encounter: Payer: Self-pay | Admitting: *Deleted

## 2017-05-29 DIAGNOSIS — R292 Abnormal reflex: Secondary | ICD-10-CM

## 2017-05-29 DIAGNOSIS — R252 Cramp and spasm: Secondary | ICD-10-CM

## 2017-05-29 DIAGNOSIS — M79602 Pain in left arm: Secondary | ICD-10-CM

## 2017-05-29 DIAGNOSIS — M79601 Pain in right arm: Secondary | ICD-10-CM

## 2017-05-29 NOTE — Telephone Encounter (Signed)
-----   Message from Alda Berthold, DO sent at 05/18/2017 12:19 PM EDT ----- Please inform patient that her labs continue to showed low calcium, which is most likely causing cramps. To better understand why calcium levels are staying low, I will refer her to endocrinology.  In the meantime, start calcium carbonate 600mg  daily.

## 2017-05-29 NOTE — Telephone Encounter (Signed)
Unable to reach patient by phone or through daughter.  Letter sent giving results and instructions.  Endocrinology referral sent.

## 2017-05-31 ENCOUNTER — Telehealth: Payer: Self-pay | Admitting: *Deleted

## 2017-05-31 ENCOUNTER — Telehealth: Payer: Self-pay | Admitting: Neurology

## 2017-05-31 NOTE — Telephone Encounter (Signed)
Called patient back and gave her lab results, MRI results and further instructions.

## 2017-05-31 NOTE — Telephone Encounter (Signed)
-----   Message from Alda Berthold, DO sent at 05/29/2017  4:32 PM EDT ----- Please inform patient her MRI looks good, mild age-related changes and no evidence of nerve impingement.

## 2017-05-31 NOTE — Telephone Encounter (Signed)
Patient would like you to please call her with her MRI results once you receive the results. Thanks

## 2017-05-31 NOTE — Telephone Encounter (Signed)
Patient given results

## 2017-06-06 ENCOUNTER — Telehealth: Payer: Self-pay | Admitting: Neurology

## 2017-06-06 NOTE — Telephone Encounter (Signed)
Called Endo and they will call patient to schedule.

## 2017-06-06 NOTE — Telephone Encounter (Signed)
Patient called regarding her referral to Endocrinology. Please call. Thanks

## 2017-06-07 ENCOUNTER — Encounter: Payer: Self-pay | Admitting: Endocrinology

## 2017-06-07 ENCOUNTER — Ambulatory Visit
Admission: RE | Admit: 2017-06-07 | Discharge: 2017-06-07 | Disposition: A | Discharge status: Home / Self Care | Payer: BLUE CROSS/BLUE SHIELD | Source: Ambulatory Visit | Attending: Internal Medicine | Admitting: Internal Medicine

## 2017-06-07 ENCOUNTER — Ambulatory Visit (INDEPENDENT_AMBULATORY_CARE_PROVIDER_SITE_OTHER): Payer: BLUE CROSS/BLUE SHIELD | Admitting: Endocrinology

## 2017-06-07 ENCOUNTER — Other Ambulatory Visit: Payer: Self-pay | Admitting: Internal Medicine

## 2017-06-07 DIAGNOSIS — E559 Vitamin D deficiency, unspecified: Secondary | ICD-10-CM | POA: Insufficient documentation

## 2017-06-07 DIAGNOSIS — Z1231 Encounter for screening mammogram for malignant neoplasm of breast: Secondary | ICD-10-CM

## 2017-06-07 LAB — VITAMIN D 25 HYDROXY (VIT D DEFICIENCY, FRACTURES): VITD: 10.56 ng/mL — ABNORMAL LOW (ref 30.00–100.00)

## 2017-06-07 MED ORDER — VITAMIN D (ERGOCALCIFEROL) 1.25 MG (50000 UNIT) PO CAPS: 50000.0000 [IU] | ORAL_CAPSULE | ORAL | 0 refills | Status: DC

## 2017-06-07 NOTE — Progress Notes (Signed)
Subjective:    Patient ID: Kelsey Santana, female    DOB: 22-Oct-1960, 56 y.o.   MRN: [id number]  HPI Pt is referred by Dr Jeanie Cooks, for hypocalcemia.  Pt was noted to have hypocalcemia 2 months ago.  She denies h/o the following: bariatric surgery, renal disease, seizures, pancreatitis, heart disease, cancer, osteoporosis, malabsorption, eating disorder, bony fx, neck surgery, or chelation rx.  She recently completed a course of vit-D 5 x 50,000 units.  She has moderate cramping of the hands/feet, and assoc arthralgias.    Past Medical History:  Diagnosis Date  . 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 GERD (gastroesophageal reflux disease)   . Hypercholesteremia   . Hypertension   . Migraine   . Pre-diabetes     Past Surgical History:  Procedure Laterality Date  . ABDOMINAL HYSTERECTOMY     complete  . COLONOSCOPY N/A 01/16/2017   Procedure: COLONOSCOPY;  Surgeon: Carol Ada, MD;  Location: [facility];  Service: Endoscopy;  Laterality: N/A;  . ESOPHAGOGASTRODUODENOSCOPY N/A 01/16/2017   Procedure: ESOPHAGOGASTRODUODENOSCOPY (EGD);  Surgeon: Carol Ada, MD;  Location: [facility];  Service: Endoscopy;  Laterality: N/A;    Social History   Social History  . Marital status: Single    Spouse name: N/A  . Number of children: N/A  . Years of education: N/A   Occupational History  . Not on file.   Social History Main Topics  . Smoking status: Former Research scientist (life sciences)  . Smokeless tobacco: Former Systems developer     Comment: quit 28 yrs ago  . Alcohol use No  . Drug use: No  . Sexual activity: No   Other Topics Concern  . Not on file   Social History Narrative   epworth sleepiness scale = 10 (11/15/15)    Lives alone in a 2 story home.  Has 3 children.  Currently not working.  Has lost 6 jobs in the past 2 months due to her leg numbness and pain.     Education: 2 years of college.    Current Outpatient Prescriptions on File Prior to Visit  Medication Sig Dispense Refill  . amLODipine  (NORVASC) 5 MG tablet Take 5 mg by mouth at bedtime.    Marland Kitchen atorvastatin (LIPITOR) 20 MG tablet Take 1 tablet (20 mg total) by mouth daily. (Patient taking differently: Take 20 mg by mouth at bedtime. ) 30 tablet 6  . Butalbital-Acetaminophen 25-325 MG TABS Take by mouth.    . cyclobenzaprine (FLEXERIL) 5 MG tablet Take 1 tablet (5 mg total) by mouth at bedtime as needed for muscle spasms. 30 tablet 5  . gabapentin (NEURONTIN) 300 MG capsule   1  . lidocaine (XYLOCAINE) 5 % ointment APPLY TO THIGH DAILY AS NEEDED FOR PAIN 30 g 3  . SUMAtriptan (IMITREX) 50 MG tablet Take 1 tablet (50 mg total) by mouth every 2 (two) hours as needed for migraine. May repeat in 2 hours if headache persists or recurs. 10 tablet 3  . tiZANidine (ZANAFLEX) 2 MG tablet Take by mouth every 6 (six) hours as needed for muscle spasms.    . traMADol (ULTRAM) 50 MG tablet Take 1 tablet (50 mg total) by mouth every 6 (six) hours as needed. 10 tablet 0   No current facility-administered medications on file prior to visit.     Allergies  Allergen Reactions  . Ciprofloxacin Itching    itching  . Hydrocortisone Nausea Only  . Ace Inhibitors Cough  Family History  Problem Relation Age of Onset  . Alcohol abuse Mother   . Diabetes Mother   . Hyperlipidemia Mother   . Hypertension Mother   . Diabetes Brother   . Hyperlipidemia Brother   . Hypertension Brother   . Thyroid disease Neg Hx     BP 114/72   Pulse 88   Wt (!) 313 lb 12.8 oz (142.3 kg)   SpO2 96%   BMI 53.86 kg/m     Review of Systems denies muscle weakness, n/v, syncope, palpitations, rash, sob, fever, rhinorrhea, easy bruising, and blurry vision.  She has numbness of the feet, urinary incontinence, cold intolerance, weight gain, and fatigue.  She has alt constip and diarrhea.      Objective:   Physical Exam VS: see vs page GEN: no distress.  Morbid obesity.  HEAD: head: no deformity eyes: no periorbital swelling, no proptosis external nose  and ears are normal mouth: no lesion seen NECK: supple, thyroid is not enlarged CHEST WALL: no deformity.  No kyphosis LUNGS: clear to auscultation CV: reg rate and rhythm, no murmur ABD: abdomen is soft, nontender.  no hepatosplenomegaly.  not distended.  no hernia MUSCULOSKELETAL: muscle bulk and strength are grossly normal.  no obvious joint swelling.  gait is normal and steady EXTEMITIES: trace bilat leg edema.  PULSES: no carotid bruit NEURO:  cn 2-12 grossly intact.   readily moves all 4's.  sensation is intact to touch on all 4's. SKIN:  Normal texture and temperature.  No rash or suspicious lesion is visible.   NODES:  None palpable at the neck PSYCH: alert, well-oriented.  Does not appear anxious nor depressed.     Lab Results  Component Value Date   PTH 217 (H) 05/14/2017   CALCIUM 8.3 (L) 05/14/2017   CAION 1.13 07/06/2011   Lab Results  Component Value Date   CREATININE 0.76 05/14/2017   BUN 11 05/14/2017   NA 146 (H) 05/14/2017   K 4.1 05/14/2017   CL 110 05/14/2017   CO2 30 05/14/2017   Lab Results  Component Value Date   ALT 19 05/14/2017   AST 19 05/14/2017   ALKPHOS 67 05/14/2017   BILITOT 0.4 05/14/2017   Lab Results  Component Value Date   TSH 0.89 05/14/2017   I personally reviewed electrocardiogram tracing (04/11/17): Indication: headache Impression: NSR.  No MI.  Low voltage Compared to 01/02/17: no signif change     Assessment & Plan:  Hypovitaminosis-D, new, uncertain etiology Hypocalcemia, due to low vit-D   Patient Instructions  blood tests are requested for you today.  We'll let you know about the results.   Based on the results, I will probable prescribe for you the high-dose vitamin-D, 3 times a week, for more month.   Please come back for a follow-up appointment in 3 months.

## 2017-06-07 NOTE — Patient Instructions (Addendum)
blood tests are requested for you today.  We'll let you know about the results.   Based on the results, I will probable prescribe for you the high-dose vitamin-D, 3 times a week, for more month.   Please come back for a follow-up appointment in 3 months.

## 2017-06-11 LAB — PTH, INTACT AND CALCIUM
Calcium: 8.4 mg/dL — ABNORMAL LOW (ref 8.6–10.4)
PTH: 155 pg/mL — AB (ref 14–64)

## 2017-07-01 ENCOUNTER — Other Ambulatory Visit: Payer: Self-pay | Admitting: Endocrinology

## 2017-07-18 ENCOUNTER — Other Ambulatory Visit: Payer: Self-pay | Admitting: Endocrinology

## 2017-07-18 ENCOUNTER — Other Ambulatory Visit (INDEPENDENT_AMBULATORY_CARE_PROVIDER_SITE_OTHER): Payer: BLUE CROSS/BLUE SHIELD

## 2017-07-18 DIAGNOSIS — E559 Vitamin D deficiency, unspecified: Secondary | ICD-10-CM | POA: Diagnosis not present

## 2017-07-18 LAB — VITAMIN D 25 HYDROXY (VIT D DEFICIENCY, FRACTURES): VITD: 20.81 ng/mL — AB (ref 30.00–100.00)

## 2017-07-18 MED ORDER — VITAMIN D (ERGOCALCIFEROL) 1.25 MG (50000 UNIT) PO CAPS: 50000.0000 [IU] | ORAL_CAPSULE | ORAL | 0 refills | Status: DC

## 2017-07-20 LAB — PTH, INTACT AND CALCIUM
CALCIUM: 9.1 mg/dL (ref 8.6–10.4)
PTH: 172 pg/mL — AB (ref 14–64)

## 2017-07-25 ENCOUNTER — Ambulatory Visit: Payer: BLUE CROSS/BLUE SHIELD | Admitting: Neurology

## 2017-08-23 ENCOUNTER — Other Ambulatory Visit (INDEPENDENT_AMBULATORY_CARE_PROVIDER_SITE_OTHER): Payer: BLUE CROSS/BLUE SHIELD

## 2017-08-23 ENCOUNTER — Other Ambulatory Visit: Payer: Self-pay | Admitting: Endocrinology

## 2017-08-23 LAB — VITAMIN D 25 HYDROXY (VIT D DEFICIENCY, FRACTURES): VITD: 26.84 ng/mL — AB (ref 30.00–100.00)

## 2017-08-23 MED ORDER — VITAMIN D (ERGOCALCIFEROL) 1.25 MG (50000 UNIT) PO CAPS: 50000.0000 [IU] | ORAL_CAPSULE | ORAL | 11 refills | Status: AC

## 2017-08-24 LAB — PTH, INTACT AND CALCIUM
Calcium: 9.1 mg/dL (ref 8.6–10.4)
PTH: 133 pg/mL — AB (ref 14–64)

## 2017-09-07 ENCOUNTER — Ambulatory Visit: Payer: BLUE CROSS/BLUE SHIELD | Admitting: Neurology

## 2017-09-11 ENCOUNTER — Ambulatory Visit: Payer: BLUE CROSS/BLUE SHIELD | Admitting: Endocrinology

## 2017-10-19 ENCOUNTER — Encounter (HOSPITAL_COMMUNITY): Payer: Self-pay

## 2017-10-19 ENCOUNTER — Emergency Department (HOSPITAL_COMMUNITY): Payer: Non-veteran care

## 2017-10-19 ENCOUNTER — Emergency Department (HOSPITAL_COMMUNITY)
Admission: EM | Admit: 2017-10-19 | Discharge: 2017-10-19 | Disposition: A | Discharge status: Home / Self Care | Payer: Non-veteran care | Attending: Emergency Medicine | Admitting: Emergency Medicine

## 2017-10-19 ENCOUNTER — Other Ambulatory Visit: Payer: Self-pay

## 2017-10-19 DIAGNOSIS — Z79899 Other long term (current) drug therapy: Secondary | ICD-10-CM | POA: Insufficient documentation

## 2017-10-19 DIAGNOSIS — S199XXA Unspecified injury of neck, initial encounter: Secondary | ICD-10-CM | POA: Diagnosis present

## 2017-10-19 DIAGNOSIS — Y999 Unspecified external cause status: Secondary | ICD-10-CM | POA: Diagnosis not present

## 2017-10-19 DIAGNOSIS — Z87891 Personal history of nicotine dependence: Secondary | ICD-10-CM | POA: Diagnosis not present

## 2017-10-19 DIAGNOSIS — S161XXA Strain of muscle, fascia and tendon at neck level, initial encounter: Secondary | ICD-10-CM | POA: Diagnosis not present

## 2017-10-19 DIAGNOSIS — Y9389 Activity, other specified: Secondary | ICD-10-CM | POA: Insufficient documentation

## 2017-10-19 DIAGNOSIS — I10 Essential (primary) hypertension: Secondary | ICD-10-CM | POA: Diagnosis not present

## 2017-10-19 DIAGNOSIS — S60212A Contusion of left wrist, initial encounter: Secondary | ICD-10-CM | POA: Diagnosis not present

## 2017-10-19 DIAGNOSIS — Y92410 Unspecified street and highway as the place of occurrence of the external cause: Secondary | ICD-10-CM | POA: Insufficient documentation

## 2017-10-19 MED ORDER — ACETAMINOPHEN 325 MG PO TABS
650.0000 mg | ORAL_TABLET | Freq: Once | ORAL | Status: AC
  Administered 2017-10-19: 650 mg via ORAL
  Filled 2017-10-19: qty 2

## 2017-10-19 MED ORDER — NAPROXEN 500 MG PO TABS: 500.0000 mg | ORAL_TABLET | Freq: Two times a day (BID) | ORAL | 0 refills | Status: DC

## 2017-10-19 MED ORDER — CYCLOBENZAPRINE HCL 10 MG PO TABS: 10.0000 mg | ORAL_TABLET | Freq: Two times a day (BID) | ORAL | 0 refills | Status: DC | PRN

## 2017-10-19 NOTE — Discharge Instructions (Signed)
Please read attached information regarding your condition. Take naproxen and Flexeril as directed for the next 2-3 days to help with your symptoms. Apply heat and stretch areas as tolerated.  Massaging the area may also help. Follow-up with your primary care provider for further evaluation. Return to ED for worsening symptoms including severe headache, vision changes, chest pain, trouble walking, numbness in arms or legs.
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
## 2017-10-19 NOTE — ED Notes (Signed)
Patient Alert and oriented to baseline. Stable and ambulatory to baseline. Patient verbalized understanding of the discharge instructions.  Patient belongings were taken by the patient.   
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
## 2018-06-28 ENCOUNTER — Ambulatory Visit (INDEPENDENT_AMBULATORY_CARE_PROVIDER_SITE_OTHER): Payer: No Typology Code available for payment source | Admitting: Neurology

## 2018-06-28 ENCOUNTER — Encounter: Payer: Self-pay | Admitting: Neurology

## 2018-06-28 ENCOUNTER — Telehealth: Payer: Self-pay | Admitting: Neurology

## 2018-06-28 VITALS — BP 120/80 | HR 72 | Ht 65.0 in | Wt 300.1 lb

## 2018-06-28 DIAGNOSIS — R51 Headache: Secondary | ICD-10-CM | POA: Diagnosis not present

## 2018-06-28 DIAGNOSIS — G43709 Chronic migraine without aura, not intractable, without status migrainosus: Secondary | ICD-10-CM | POA: Diagnosis not present

## 2018-06-28 DIAGNOSIS — R519 Headache, unspecified: Secondary | ICD-10-CM

## 2018-06-28 MED ORDER — DIHYDROERGOTAMINE MESYLATE 4 MG/ML NA SOLN: 1.0000 | NASAL | 12 refills | Status: DC | PRN

## 2018-06-28 MED ORDER — NORTRIPTYLINE HCL 10 MG PO CAPS: ORAL_CAPSULE | ORAL | 3 refills | Status: DC

## 2018-06-28 NOTE — Addendum Note (Signed)
Addended by: Chester Holstein on: 06/28/2018 08:42 AM   Modules accepted: Orders

## 2018-06-28 NOTE — Progress Notes (Signed)
Follow-up Visit   Date: 06/28/18    Kelsey Santana MRN: [id number] DOB: August 03, 1961   Interim History: Kelsey Santana is a 57 y.o. right-handed African American female with hyperlipidemia, hypertension, GERD, episodic migraine, former tobacco use, and chronic low back pain returning to the clinic for follow-up of worsening headaches and generalized pain.  The patient was accompanied to the clinic by self.  History of present illness: Initial visit 03/2016 bilateral leg pain:  She complains of numbness and tingling sensation over the lateral thigh which radiates into her lower legs and heel started in the summer 2016.  She denies wearing tight belt or constrictive clothing.  She has always been overweight and has been trying to go to the gym to loose her weight.  She also has spells of bilateral entire leg numbness which is worse with standing and improved by rest.  She also reports having gait instability and falls.  One fall occurred because of severe pain in the left leg as if someone was tearing something inside her leg and the other occurred while stepping out to pump gas and her right leg simply buckled and went out.   She has throbbing pain in her knees which is worse when walking.  She has tenderness around her knees with palpation.    She has been getting ESI for low back pain with Dr. Nelva Bush which has helped low back pain, but provided no relief to her burning thigh pain or bilateral leg numbness.  MRI lumbar spine from May 2017 does not show any neural impingement.  NCS/EMG of the legs performed in August 2017 was normal.   UPDATE 05/14/2017:  She is here for new complaints of painful cramping involving the hands and feet. It can occur sporadically every few weeks.  It can last about 10-minutes.  She denies any weakness.  She has noticed that seafood can make it worse, but it can occur with anything.  She has stiffness with walking.  She is staying well-hydrated. Exercise or  cold temperature do not trigger cramps. Her maternal aunt had cramps. She was started tizanidine about a month but this only made her sleepy and did not help with cramps.  She took one yesterday and feel asleep at church, which worried bystanders.  Last week, she feel asleep at work and was terminated  She also complains of migraines, with stabbing pain over the temples.  She has nausea/vomiting, photophobia, and phonophobia. Sometimes, she has blurry vision. Over the past two months, she had 15 migraines.  They last about 2-3 days.  She has tried NSAIDs and tylenol. These are similar to what she used to experience these in 2004 and was treated with trigger point injections.  She went to the ER where MRI brain did not show any acute changes.    MRI cervical spine also did not show nerve impingement.   UPDATE 06/28/2018:  She returns with ongoing complains of headaches.  She reports having a daily dull headache and migraines about 1-2 times per week.  He migraines usually last about 2-3 days.  She has associated nausea, vomiting.  She has tried imitrex which does not provide any relief.  [facility] and previously been on topiramate, depakote, and wellbutrin.  Nothing provides relief.  She also complains of generalized pain over the muscles and joints.  Some days are better than others, no specific triggers.    Medications:  Current Outpatient Medications  on File Prior to Visit  Medication Sig Dispense Refill  . amLODipine (NORVASC) 5 MG tablet Take 5 mg by mouth at bedtime.    Marland Kitchen atorvastatin (LIPITOR) 20 MG tablet Take 1 tablet (20 mg total) by mouth daily. (Patient taking differently: Take 20 mg by mouth at bedtime. ) 30 tablet 6  . cyclobenzaprine (FLEXERIL) 10 MG tablet Take 1 tablet (10 mg total) by mouth 2 (two) times daily as needed for muscle spasms. 20 tablet 0  . DULoxetine (CYMBALTA) 60 MG capsule Take 60 mg by mouth daily.    Marland Kitchen lidocaine  (XYLOCAINE) 5 % ointment APPLY TO THIGH DAILY AS NEEDED FOR PAIN 30 g 3  . naproxen (NAPROSYN) 500 MG tablet Take 1 tablet (500 mg total) by mouth 2 (two) times daily. 30 tablet 0  . SUMAtriptan (IMITREX) 50 MG tablet Take 1 tablet (50 mg total) by mouth every 2 (two) hours as needed for migraine. May repeat in 2 hours if headache persists or recurs. 10 tablet 3  . tiZANidine (ZANAFLEX) 2 MG tablet Take by mouth every 6 (six) hours as needed for muscle spasms.    . traMADol (ULTRAM) 50 MG tablet Take 1 tablet (50 mg total) by mouth every 6 (six) hours as needed. (Patient not taking: Reported on 06/28/2018) 10 tablet 0   No current facility-administered medications on file prior to visit.     Allergies:  Allergies  Allergen Reactions  . Ciprofloxacin Itching    itching  . Hydrocortisone Nausea Only  . Ace Inhibitors Cough    Review of Systems:  CONSTITUTIONAL: No fevers, chills, night sweats, or weight loss.  EYES: No visual changes or eye pain ENT: No hearing changes.  No history of nose bleeds.   RESPIRATORY: No cough, wheezing and shortness of breath.   CARDIOVASCULAR: Negative for chest pain, and palpitations.   GI: Negative for abdominal discomfort, blood in stools or black stools.  No recent change in bowel habits.   GU:  No history of incontinence.   MUSCLOSKELETAL: No history of joint pain or swelling.  No myalgias.   SKIN: Negative for lesions, rash, and itching.   ENDOCRINE: Negative for cold or heat intolerance, polydipsia or goiter.   PSYCH:  No depression or anxiety symptoms.   NEURO: As Above.   Vital Signs:  BP 120/80   Pulse 72   Ht 5\' 5"  (1.651 m)   Wt (!) 300 lb 2 oz (136.1 kg)   SpO2 95%   BMI 49.94 kg/m    General Medical Exam:   General:  Well appearing, comfortable  Eyes/ENT: see cranial nerve examination.   Neck: No masses appreciated.  Full range of motion without tenderness.  No carotid bruits. Respiratory:  Clear to auscultation, good air entry  bilaterally.   Cardiac:  Regular rate and rhythm, no murmur.   Ext:  No edema  Neurological Exam: MENTAL STATUS including orientation to time, place, person, recent and remote memory, attention span and concentration, language, and fund of knowledge is normal.  Speech is not dysarthric.  CRANIAL NERVES:  Pupils equal round and reactive to light.  Normal conjugate, extra-ocular eye movements in all directions of gaze.  No ptosis.   Face is symmetric. Palate elevates symmetrically.  Tongue is midline.  MOTOR:  Motor strength is 5/5 in all extremities.  No no grip myotonia or percussion myotonia.  No atrophy, fasciculations or abnormal movements.  No pronator drift.  Tone is normal.    MSRs:  Reflexes  are 2+/4 throughout.   SENSORY:  Intact to vibration throughout.  COORDINATION/GAIT:  Gait wide-based due to body habitus, stable and unassisted.  Data: MRI lumbar spine 01/15/2016:   1.  L5-S1:  Moderate facet DJD and mild disc osteophyte complex without stenosis 2.  L4-L5:  Severe facet DJD without stenosis 3.  L3-L4:  Disc degeneration without stenosis  MRI brain wo contrast 04/11/2017:  No cause of the presenting symptoms is identified. Few scattered foci of T2 and FLAIR signal within the hemispheric white matter consistent with chronic small vessel change. 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  MRI cervical spine 05/29/2017:  1. Normal appearance of the cervical cord. 2. C5-6 and C6-7 disc degeneration without impingement.  NCS/EMG of the legs 04/18/2016:  Normal  Lab Results  Component Value Date   CKTOTAL 129 05/14/2016     IMPRESSION/PLAN: 1.  Chronic migraine - worsening  - For preventative therapy, start nortriptyline 10mg  at bedtime x 2 weeks, then increase to 20mg  at bedtime  - For severe migraine, start migranal nasal spray  - sample provided.  - D/C gabapentin  - She has previously been on topiramate, depakote, wellbutrin, [facility]  2.  Diffuse myalgias, ?fibromyalgia.  She has extensive neurological evaluation including NCS/EMG, MRI brain, MRI cervical spine, and MRI lumbar spine which has been essentially unremarkable.  She has arthritis changes in the spine, none of which would explain her diffuse myalgias.  No findings of myopathy on EMG and CK is normal.  Recommend follow-up with PCP to further evaluation for possibility of fibromylagia.  She was informed that my office does not manage this.   3.  Left meralgia paresthetica.  Counseled on weight loss.   Return to clinic in 4 months   Thank you for allowing me to participate in patient's care.  If I can answer any additional questions, I would be pleased to do so.    Sincerely,    Sojourner Behringer K. Posey Pronto, DO

## 2018-06-28 NOTE — Patient Instructions (Addendum)
For preventative therapy, start nortriptyline 10mg  at bedtime x 2 weeks, then increase to 20mg  at bedtime For severe migraine, start migranal nasal spray  Stop gabapentin  Follow-up with your primary care doctor for possibility of fibromyalgia.  Return to clinic in 4 months

## 2018-06-28 NOTE — Telephone Encounter (Signed)
Error

## 2018-07-01 ENCOUNTER — Other Ambulatory Visit: Payer: Self-pay | Admitting: *Deleted

## 2018-07-01 ENCOUNTER — Telehealth: Payer: Self-pay | Admitting: Neurology

## 2018-07-01 MED ORDER — DIHYDROERGOTAMINE MESYLATE 4 MG/ML NA SOLN: 1.0000 | NASAL | 5 refills | Status: DC | PRN

## 2018-07-01 NOTE — Telephone Encounter (Signed)
Patient wants the Migranel nose spray prescription sent to the [facility] on [address]. She said that this is the only thing that helps her migraines. Please send. Thanks!

## 2018-07-01 NOTE — Telephone Encounter (Signed)
Rx sent 

## 2018-07-03 ENCOUNTER — Telehealth: Payer: Self-pay | Admitting: Neurology

## 2018-07-03 NOTE — Telephone Encounter (Signed)
I spoke with patient and informed her that I will send her Rx to [place].  I was unaware to do this until now.

## 2018-07-03 NOTE — Telephone Encounter (Signed)
Patient called and is needing to speak with you regarding her Migranol prescription. The medication with out a co pay card is 3000 and 2000 with co pay card. She is needing something for Chronic Severe Migraines. She was given 1 Sample treatment and she said it worked. Please Call. Thanks

## 2018-07-03 NOTE — Telephone Encounter (Signed)
Left message for patient that I will check into this for her.

## 2018-07-04 ENCOUNTER — Other Ambulatory Visit: Payer: Self-pay | Admitting: *Deleted

## 2018-07-04 MED ORDER — DIHYDROERGOTAMINE MESYLATE 4 MG/ML NA SOLN: 1.0000 | NASAL | 5 refills | Status: DC | PRN

## 2018-07-24 ENCOUNTER — Telehealth: Payer: Self-pay | Admitting: Neurology

## 2018-07-24 ENCOUNTER — Other Ambulatory Visit: Payer: Self-pay

## 2018-07-24 DIAGNOSIS — R51 Headache: Principal | ICD-10-CM

## 2018-07-24 DIAGNOSIS — R519 Headache, unspecified: Secondary | ICD-10-CM

## 2018-07-24 MED ORDER — DIHYDROERGOTAMINE MESYLATE 4 MG/ML NA SOLN
1.0000 | NASAL | 0 refills | Status: DC | PRN
Start: 2018-07-24 — End: 2021-03-28

## 2018-07-24 NOTE — Telephone Encounter (Signed)
Patient said to Fax to the [place] office at [phone] for the migranol prescription. Thanks!

## 2018-07-25 MED ORDER — DIHYDROERGOTAMINE MESYLATE 4 MG/ML NA SOLN: 1.0000 | NASAL | 5 refills | Status: DC | PRN

## 2018-07-25 NOTE — Telephone Encounter (Signed)
Rx placed on Dr. Serita Grit desk for [employer]

## 2018-07-25 NOTE — Telephone Encounter (Signed)
Rx's and LOV notes faxed to numbers below.

## 2018-07-25 NOTE — Telephone Encounter (Signed)
Herbie Baltimore called from the [place] and is needing the patients last office visit note faxed to [phone]. Thanks

## 2018-07-25 NOTE — Telephone Encounter (Signed)
Patient called and is needing to have her Prescription for Migranol and her recent Clinical Notes faxed to [phone]. The fax is needing to be attention to [facility]. Thanks

## 2018-08-12 ENCOUNTER — Encounter

## 2018-08-12 ENCOUNTER — Ambulatory Visit: Payer: Non-veteran care | Admitting: Neurology

## 2019-02-16 ENCOUNTER — Emergency Department (HOSPITAL_COMMUNITY): Payer: No Typology Code available for payment source

## 2019-02-16 ENCOUNTER — Other Ambulatory Visit: Payer: Self-pay

## 2019-02-16 ENCOUNTER — Observation Stay (HOSPITAL_COMMUNITY)
Admission: EM | Admit: 2019-02-16 | Discharge: 2019-02-17 | Disposition: A | Discharge status: Home / Self Care | Payer: No Typology Code available for payment source | Attending: Internal Medicine | Admitting: Internal Medicine

## 2019-02-16 DIAGNOSIS — Z03818 Encounter for observation for suspected exposure to other biological agents ruled out: Secondary | ICD-10-CM | POA: Diagnosis not present

## 2019-02-16 DIAGNOSIS — R079 Chest pain, unspecified: Principal | ICD-10-CM | POA: Insufficient documentation

## 2019-02-16 DIAGNOSIS — R0789 Other chest pain: Secondary | ICD-10-CM

## 2019-02-16 DIAGNOSIS — Z87891 Personal history of nicotine dependence: Secondary | ICD-10-CM | POA: Diagnosis not present

## 2019-02-16 DIAGNOSIS — E785 Hyperlipidemia, unspecified: Secondary | ICD-10-CM | POA: Insufficient documentation

## 2019-02-16 DIAGNOSIS — F329 Major depressive disorder, single episode, unspecified: Secondary | ICD-10-CM | POA: Insufficient documentation

## 2019-02-16 DIAGNOSIS — Z79899 Other long term (current) drug therapy: Secondary | ICD-10-CM | POA: Insufficient documentation

## 2019-02-16 DIAGNOSIS — I10 Essential (primary) hypertension: Secondary | ICD-10-CM | POA: Diagnosis not present

## 2019-02-16 DIAGNOSIS — F32A Depression, unspecified: Secondary | ICD-10-CM | POA: Diagnosis present

## 2019-02-16 LAB — CBC
HCT: 40 % (ref 36.0–46.0)
Hemoglobin: 12.8 g/dL (ref 12.0–15.0)
MCH: 28.8 pg (ref 26.0–34.0)
MCHC: 32 g/dL (ref 30.0–36.0)
MCV: 90.1 fL (ref 80.0–100.0)
Platelets: 237 10*3/uL (ref 150–400)
RBC: 4.44 MIL/uL (ref 3.87–5.11)
RDW: 13.6 % (ref 11.5–15.5)
WBC: 7.4 10*3/uL (ref 4.0–10.5)
nRBC: 0 % (ref 0.0–0.2)

## 2019-02-16 LAB — BASIC METABOLIC PANEL
Anion gap: 9 (ref 5–15)
BUN: 12 mg/dL (ref 6–20)
CO2: 28 mmol/L (ref 22–32)
Calcium: 8.9 mg/dL (ref 8.9–10.3)
Chloride: 107 mmol/L (ref 98–111)
Creatinine, Ser: 0.94 mg/dL (ref 0.44–1.00)
GFR calc Af Amer: >60 mL/min (ref >60)
GFR calc non Af Amer: >60 mL/min (ref >60)
Glucose, Bld: 115 mg/dL — ABNORMAL HIGH (ref 70–99)
Potassium: 4 mmol/L (ref 3.5–5.1)
Sodium: 144 mmol/L (ref 135–145)

## 2019-02-16 LAB — D-DIMER, QUANTITATIVE: D-Dimer, Quant: 0.6 ug/mL-FEU — ABNORMAL HIGH (ref 0.00–0.50)

## 2019-02-16 LAB — TROPONIN I (HIGH SENSITIVITY)
Troponin I (High Sensitivity): 4 ng/L (ref <18)
Troponin I (High Sensitivity): 4 ng/L (ref <18)

## 2019-02-16 MED ORDER — SODIUM CHLORIDE 0.9% FLUSH
3.0000 mL | Freq: Once | INTRAVENOUS | Status: AC
  Administered 2019-02-17: 3 mL via INTRAVENOUS

## 2019-02-16 MED ORDER — MORPHINE SULFATE (PF) 4 MG/ML IV SOLN
6.0000 mg | Freq: Once | INTRAVENOUS | Status: AC
  Administered 2019-02-16: 6 mg via INTRAVENOUS
  Filled 2019-02-16: qty 2

## 2019-02-16 MED ORDER — ASPIRIN 81 MG PO CHEW
324.0000 mg | CHEWABLE_TABLET | Freq: Once | ORAL | Status: AC
  Administered 2019-02-16: 324 mg via ORAL
  Filled 2019-02-16: qty 4

## 2019-02-16 MED ORDER — IOHEXOL 350 MG/ML SOLN
100.0000 mL | Freq: Once | INTRAVENOUS | Status: AC | PRN
  Administered 2019-02-16: 100 mL via INTRAVENOUS

## 2019-02-16 NOTE — ED Notes (Signed)
ED TO INPATIENT HANDOFF REPORT  ED Nurse Name and Phone #: [phone]  S Name/Age/Gender Kelsey Santana 58 y.o. female Room/Bed: 020C/020C  Code Status   Code Status: Not on file  Home/SNF/Other Home Patient oriented to: self, place, time and situation Is this baseline? Yes   Triage Complete: Triage complete  Chief Complaint Chest Pain, Headache, lt breast pain  Triage Note Patient reports sudden onset pain under L breast at 1AM this morning, describes as sharp and constant since and pain worse when taking deep breaths. She states she had the same occur two times about 3-4 weeks ago. Endorses nausea with the pain, as well as headache and L sided neck pain. Denies shortness of breath, dizziness, vomiting. Resp e/u, skin w/d.    Allergies Allergies  Allergen Reactions  . Ciprofloxacin Itching    itching  . Hydrocortisone Nausea Only  . Ace Inhibitors Cough    Level of Care/Admitting Diagnosis ED Disposition    ED Disposition Condition Comment   Admit  Hospital Area: [place] [[id number]]  Level of Care: Progressive [102]  I expect the patient will be discharged within 24 hours: No (not a candidate for 5C-Observation unit)  Covid Evaluation: Screening Protocol (No Symptoms)  Diagnosis: Chest pain [244010]  Admitting Physician: Rise Patience [phone]  Attending Physician: Rise Patience [[id number]  PT Class (Do Not Modify): Observation [104]  PT Acc Code (Do Not Modify): Observation [10022]       B Medical/Surgery History Past Medical History:  Diagnosis Date  . 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 GERD (gastroesophageal reflux disease)   . Hypercholesteremia   . Hypertension   . Migraine   . Pre-diabetes    Past Surgical History:  Procedure Laterality Date  . ABDOMINAL HYSTERECTOMY     complete  . COLONOSCOPY N/A 01/16/2017   Procedure: COLONOSCOPY;  Surgeon: Carol Ada, MD;  Location: [facility];  Service: Endoscopy;  Laterality:  N/A;  . ESOPHAGOGASTRODUODENOSCOPY N/A 01/16/2017   Procedure: ESOPHAGOGASTRODUODENOSCOPY (EGD);  Surgeon: Carol Ada, MD;  Location: [facility];  Service: Endoscopy;  Laterality: N/A;     A IV Location/Drains/Wounds Patient Lines/Drains/Airways Status   Active Line/Drains/Airways    Name:   Placement date:   Placement time:   Site:   Days:   Peripheral IV 02/16/19 Right Antecubital   02/16/19    2051    Antecubital   less than 1          Intake/Output Last 24 hours No intake or output data in the 24 hours ending 02/16/19 2331  Labs/Imaging Results for orders placed or performed during the hospital encounter of 02/16/19 (from the past 48 hour(s))  Basic metabolic panel     Status: Abnormal   Collection Time: 02/16/19  7:21 PM  Result Value Ref Range   Sodium 144 135 - 145 mmol/L   Potassium 4.0 3.5 - 5.1 mmol/L   Chloride 107 98 - 111 mmol/L   CO2 28 22 - 32 mmol/L   Glucose, Bld 115 (H) 70 - 99 mg/dL   BUN 12 6 - 20 mg/dL   Creatinine, Ser 0.94 0.44 - 1.00 mg/dL   Calcium 8.9 8.9 - 10.3 mg/dL   GFR calc non Af Amer >60 >60 mL/min   GFR calc Af Amer >60 >60 mL/min   Anion gap 9 5 - 15    Comment: Performed at [facility], [place] [address] [place], [place] [address]  CBC     Status: None   Collection Time: 02/16/19  7:21 PM  Result Value Ref Range   WBC 7.4 4.0 - 10.5 K/uL   RBC 4.44 3.87 - 5.11 MIL/uL   Hemoglobin 12.8 12.0 - 15.0 g/dL   HCT 40.0 36.0 - 46.0 %   MCV 90.1 80.0 - 100.0 fL   MCH 28.8 26.0 - 34.0 pg   MCHC 32.0 30.0 - 36.0 g/dL   RDW 13.6 11.5 - 15.5 %   Platelets 237 150 - 400 K/uL   nRBC 0.0 0.0 - 0.2 %    Comment: Performed at [facility], [place] [address] [place], [place] [address]  Troponin I (High Sensitivity)     Status: None   Collection Time: 02/16/19  7:21 PM  Result Value Ref Range   Troponin I (High Sensitivity) 4 <18 ng/L    Comment: (NOTE) Elevated high sensitivity troponin I (hsTnI) values and significant  changes  across serial measurements may suggest ACS but many other  chronic and acute conditions are known to elevate hsTnI results.  Refer to the "Links" section for chest pain algorithms and additional  guidance. Performed at [facility], [place] [address], [place], [place] [address]   D-dimer, quantitative (not at [facility])     Status: Abnormal   Collection Time: 02/16/19  7:21 PM  Result Value Ref Range   D-Dimer, Quant 0.60 (H) 0.00 - 0.50 ug/mL-FEU    Comment: (NOTE) At the manufacturer cut-off of 0.50 ug/mL FEU, this assay has been documented to exclude PE with a sensitivity and negative predictive value of 97 to 99%.  At this time, this assay has not been approved by the FDA to exclude DVT/VTE. Results should be correlated with clinical presentation. Performed at [facility], [place] [address], [place], [place] [address]   Troponin I (High Sensitivity)     Status: None   Collection Time: 02/16/19 10:16 PM  Result Value Ref Range   Troponin I (High Sensitivity) 4 <18 ng/L    Comment: (NOTE) Elevated high sensitivity troponin I (hsTnI) values and significant  changes across serial measurements may suggest ACS but many other  chronic and acute conditions are known to elevate hsTnI results.  Refer to the "Links" section for chest pain algorithms and additional  guidance. Performed at [facility], [place] [address] [place], [place] [address]    Dg Chest 2 View  Result Date: 02/16/2019 CLINICAL DATA:  Left chest pain EXAM: CHEST - 2 VIEW COMPARISON:  04/11/2017 FINDINGS: Heart and mediastinal contours are within normal limits. No focal opacities or effusions. No acute bony abnormality. IMPRESSION: No active cardiopulmonary disease. Electronically Signed   By: Rolm Baptise M.D.   On: 02/16/2019 20:05   Ct Angio Chest Pe W And/or Wo Contrast  Result Date: 02/16/2019 CLINICAL DATA:  Chest pain. EXAM: CT ANGIOGRAPHY CHEST WITH CONTRAST TECHNIQUE: Multidetector CT imaging of the  chest was performed using the standard protocol during bolus administration of intravenous contrast. Multiplanar CT image reconstructions and MIPs were obtained to evaluate the vascular anatomy. CONTRAST:  146mL OMNIPAQUE IOHEXOL 350 MG/ML SOLN COMPARISON:  None. FINDINGS: Cardiovascular: Examination is limited by patient body habitus, streak artifact from the patient's arms, and suboptimal contrast bolus timing. Given these limitations,there is no pulmonary embolus. The main pulmonary artery is within normal limits for size. There is no CT evidence of acute right heart strain. There are atherosclerotic of the thoracic aorta. There  are mild coronary artery calcifications. Heart size is slightly increased. There is no significant pericardial effusion. Mediastinum/Nodes: --No mediastinal or hilar lymphadenopathy. --No axillary lymphadenopathy. --No supraclavicular lymphadenopathy. --Normal thyroid gland. --The esophagus is unremarkable Lungs/Pleura: There are subtle ground-glass airspace opacities involving the peripheral right lower lobe and medial right lower lobe. The remaining lung fields are essentially clear. There is no pneumothorax. There is no large pleural effusion. Upper Abdomen: No acute abnormality. Musculoskeletal: No chest wall abnormality. No acute or significant osseous findings. Review of the MIP images confirms the above findings. IMPRESSION: 1. Limited study for the detection of pulmonary emboli as detailed above. Given these limitations, no definite pulmonary embolus was identified on today's exam. 2. Ground-glass airspace opacities involving the right lower lobe as detailed above. These could represent a developing infectious process. Consider a three-month follow-up CT to confirm resolution of this finding. 3. Mild cardiac enlargement. Aortic Atherosclerosis (ICD10-I70.0). Electronically Signed   By: Constance Holster M.D.   On: 02/16/2019 22:09    Pending Labs Unresulted Labs (From  admission, onward)    Start     Ordered   02/16/19 2254  SARS Coronavirus 2 (CEPHEID - Performed in [place] hospital lab), Hosp Order  (Asymptomatic Patients Labs)  Once,   STAT    Question:  Rule Out  Answer:  Yes   02/16/19 2254          Vitals/Pain Today's Vitals   02/16/19 2100 02/16/19 2215 02/16/19 2315 02/16/19 2327  BP: 138/76  (!) 144/86   Pulse: 66 73 74   Resp: 17 19    Temp:      TempSrc:      SpO2: 95% 100% 100%   PainSc:    7     Isolation Precautions No active isolations  Medications Medications  sodium chloride flush (NS) 0.9 % injection 3 mL (has no administration in time range)  aspirin chewable tablet 324 mg (324 mg Oral Given 02/16/19 2047)  morphine 4 MG/ML injection 6 mg (6 mg Intravenous Given 02/16/19 2047)  iohexol (OMNIPAQUE) 350 MG/ML injection 100 mL (100 mLs Intravenous Contrast Given 02/16/19 2159)    Mobility walks Low fall risk   Focused Assessments Cardiac Assessment Handoff:    Lab Results  Component Value Date   CKTOTAL 129 05/14/2016   Lab Results  Component Value Date   DDIMER 0.60 (H) 02/16/2019   Does the Patient currently have chest pain? Yes     R Recommendations: See Admitting Provider Note  Report given to:   Additional Notes: pt continues to rate chest pain 7/10 and HA 10/10. VSS. Pt reports only minimal decrease in pain following morphine

## 2019-02-16 NOTE — ED Provider Notes (Signed)
[facility] EMERGENCY DEPARTMENT Provider Note   CSN: [id number] Arrival date & time: 02/16/19  1903    History   Chief Complaint Chief Complaint  Patient presents with  . Chest Pain    HPI Kelsey Santana is a 58 y.o. female.     The history is provided by the patient.  Chest Pain Pain location:  L chest Pain quality: pressure   Pain radiates to:  Neck and L jaw Pain severity:  Mild Onset quality:  Gradual Duration:  2 days Timing:  Intermittent Progression:  Partially resolved Chronicity:  New Context: at rest   Worsened by:  Deep breathing Ineffective treatments:  None tried Associated symptoms: no abdominal pain, no back pain, no cough, no fever, no nausea, no palpitations, no shortness of breath, no vomiting and no weakness   Risk factors: high cholesterol and hypertension   Risk factors: no coronary artery disease, no diabetes mellitus and no prior DVT/PE     Past Medical History:  Diagnosis Date  . 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 GERD (gastroesophageal reflux disease)   . Hypercholesteremia   . Hypertension   . Migraine   . Pre-diabetes     Patient Active Problem List   Diagnosis Date Noted  . 'light-for-dates' infant with signs of fetal malnutrition 06/07/2017  . Hypocalcemia 06/07/2017  . Vitamin D deficiency 06/07/2017  . Chest pain 11/15/2015  . Depression 04/15/2012  . Morbid obesity due to excess calories ([place]) 04/15/2012  . Tinea corporis 04/15/2012  . Fatigue 04/15/2012  . Epigastric pain 03/19/2012  . Migraine headache 03/11/2012  . GERD (gastroesophageal reflux disease) 03/11/2012  . HTN (hypertension) 03/11/2012  . HLD (hyperlipidemia) 03/11/2012    Past Surgical History:  Procedure Laterality Date  . ABDOMINAL HYSTERECTOMY     complete  . COLONOSCOPY N/A 01/16/2017   Procedure: COLONOSCOPY;  Surgeon: Carol Ada, MD;  Location: [facility];  Service: Endoscopy;  Laterality: N/A;  .  ESOPHAGOGASTRODUODENOSCOPY N/A 01/16/2017   Procedure: ESOPHAGOGASTRODUODENOSCOPY (EGD);  Surgeon: Carol Ada, MD;  Location: [facility];  Service: Endoscopy;  Laterality: N/A;     OB History   No obstetric history on file.      Home Medications    Prior to Admission medications   Medication Sig Start Date End Date Taking? Authorizing Provider  amLODipine (NORVASC) 5 MG tablet Take 5 mg by mouth at bedtime.   Yes [provider]  atorvastatin (LIPITOR) 20 MG tablet Take 1 tablet (20 mg total) by mouth daily. Patient taking differently: Take 20 mg by mouth at bedtime.  11/15/15  Yes Hilty, Nadean Corwin, MD  cyclobenzaprine (FLEXERIL) 10 MG tablet Take 1 tablet (10 mg total) by mouth 2 (two) times daily as needed for muscle spasms. 10/19/17  Yes Khatri, Hina, PA-C  dihydroergotamine (MIGRANAL) 4 MG/ML nasal spray Place 1 spray into the nose as needed for migraine. Use in one nostril as directed.  No more than 4 sprays in one hour 07/24/18  Yes Patel, Donika K, DO  DULoxetine (CYMBALTA) 60 MG capsule Take 60 mg by mouth daily.   Yes [provider]  gabapentin (NEURONTIN) 300 MG capsule Take 300 mg by mouth 2 (two) times daily.   Yes [provider]  ibuprofen (ADVIL) 200 MG tablet Take 400 mg by mouth every 6 (six) hours as needed for headache.   Yes [provider]  lidocaine (XYLOCAINE) 5 % ointment APPLY TO THIGH DAILY AS  NEEDED FOR PAIN Patient taking differently: Apply 1 application topically daily as needed for mild pain.  05/25/17  Yes Patel, Donika K, DO  naproxen (NAPROSYN) 500 MG tablet Take 1 tablet (500 mg total) by mouth 2 (two) times daily. 10/19/17  Yes Khatri, Hina, PA-C  nortriptyline (PAMELOR) 10 MG capsule Start 10mg  at bedtime for 2 weeks, then increase to 2 tablet at bedtime Patient taking differently: Take 20 mg by mouth at bedtime.  06/28/18  Yes Patel, Donika K, DO  SUMAtriptan (IMITREX) 50 MG tablet Take 1 tablet (50 mg total) by mouth  every 2 (two) hours as needed for migraine. May repeat in 2 hours if headache persists or recurs. 05/14/17  Yes Patel, Donika K, DO  traMADol (ULTRAM) 50 MG tablet Take 1 tablet (50 mg total) by mouth every 6 (six) hours as needed. 04/11/17  Yes Delo, Nathaneil Canary, MD  Vitamin D, Ergocalciferol, (DRISDOL) 1.25 MG (50000 UT) CAPS capsule Take 50,000 Units by mouth 3 (three) times a week.   Yes [provider]  dihydroergotamine (MIGRANAL) 4 MG/ML nasal spray Place 1 spray into the nose as needed for migraine. Use in one nostril as directed.  No more than 4 sprays in one hour 07/25/18   Alda Berthold, DO    Family History Family History  Problem Relation Age of Onset  . Alcohol abuse Mother   . Diabetes Mother   . Hyperlipidemia Mother   . Hypertension Mother   . Diabetes Brother   . Hyperlipidemia Brother   . Hypertension Brother   . Thyroid disease Neg Hx     Social History Social History   Tobacco Use  . Smoking status: Former Research scientist (life sciences)  . Smokeless tobacco: Former Systems developer  . Tobacco comment: quit 28 yrs ago  Substance Use Topics  . Alcohol use: No  . Drug use: No     Allergies   Ciprofloxacin, Hydrocortisone, and Ace inhibitors   Review of Systems Review of Systems  Constitutional: Negative for chills and fever.  HENT: Negative for ear pain and sore throat.   Eyes: Negative for pain and visual disturbance.  Respiratory: Negative for cough and shortness of breath.   Cardiovascular: Positive for chest pain. Negative for palpitations.  Gastrointestinal: Negative for abdominal pain, nausea and vomiting.  Genitourinary: Negative for dysuria and hematuria.  Musculoskeletal: Negative for arthralgias and back pain.  Skin: Negative for color change and rash.  Neurological: Negative for seizures, syncope and weakness.  All other systems reviewed and are negative.    Physical Exam Updated Vital Signs  ED Triage Vitals  Enc Vitals Group     BP 02/16/19 1915 126/69      Pulse Rate 02/16/19 1915 73     Resp 02/16/19 1915 16     Temp 02/16/19 1915 97.9 F (36.6 C)     Temp Source 02/16/19 1915 Oral     SpO2 02/16/19 1915 99 %     Weight --      Height --      Head Circumference --      Peak Flow --      Pain Score 02/16/19 1911 7     Pain Loc --      Pain Edu? --      Excl. in [place]? --     Physical Exam Vitals signs and nursing note reviewed.  Constitutional:      General: She is not in acute distress.    Appearance: She is well-developed. She is  obese. She is not ill-appearing.  HENT:     Head: Normocephalic and atraumatic.  Eyes:     Extraocular Movements: Extraocular movements intact.     Conjunctiva/sclera: Conjunctivae normal.     Pupils: Pupils are equal, round, and reactive to light.  Neck:     Musculoskeletal: Normal range of motion and neck supple.  Cardiovascular:     Rate and Rhythm: Normal rate and regular rhythm.     Pulses:          Radial pulses are 2+ on the right side and 2+ on the left side.     Heart sounds: Normal heart sounds. No murmur.  Pulmonary:     Effort: Pulmonary effort is normal. No respiratory distress.     Breath sounds: Normal breath sounds. No decreased breath sounds, wheezing or rhonchi.  Abdominal:     Palpations: Abdomen is soft.     Tenderness: There is no abdominal tenderness.  Musculoskeletal: Normal range of motion.     Right lower leg: No edema.     Left lower leg: No edema.  Skin:    General: Skin is warm and dry.     Capillary Refill: Capillary refill takes less than 2 seconds.  Neurological:     General: No focal deficit present.     Mental Status: She is alert.      ED Treatments / Results  Labs (all labs ordered are listed, but only abnormal results are displayed) Labs Reviewed  BASIC METABOLIC PANEL - Abnormal; Notable for the following components:      Result Value   Glucose, Bld 115 (*)    All other components within normal limits  D-DIMER, QUANTITATIVE (NOT AT [facility]) -  Abnormal; Notable for the following components:   D-Dimer, Quant 0.60 (*)    All other components within normal limits  SARS CORONAVIRUS 2 (HOSPITAL ORDER, [facility])  CBC  TROPONIN I (HIGH SENSITIVITY)  TROPONIN I (HIGH SENSITIVITY)    EKG EKG Interpretation  Date/Time:  Sunday February 16 2019 19:10:27 EDT Ventricular Rate:  78 PR Interval:  130 QRS Duration: 72 QT Interval:  388 QTC Calculation: 442 R Axis:   69 Text Interpretation:  Sinus rhythm with frequent Premature ventricular complexes Otherwise normal ECG Confirmed by Lennice Sites [phone]) on 02/16/2019 7:15:40 PM   Radiology Dg Chest 2 View  Result Date: 02/16/2019 CLINICAL DATA:  Left chest pain EXAM: CHEST - 2 VIEW COMPARISON:  04/11/2017 FINDINGS: Heart and mediastinal contours are within normal limits. No focal opacities or effusions. No acute bony abnormality. IMPRESSION: No active cardiopulmonary disease. Electronically Signed   By: Rolm Baptise M.D.   On: 02/16/2019 20:05   Ct Angio Chest Pe W And/or Wo Contrast  Result Date: 02/16/2019 CLINICAL DATA:  Chest pain. EXAM: CT ANGIOGRAPHY CHEST WITH CONTRAST TECHNIQUE: Multidetector CT imaging of the chest was performed using the standard protocol during bolus administration of intravenous contrast. Multiplanar CT image reconstructions and MIPs were obtained to evaluate the vascular anatomy. CONTRAST:  157mL OMNIPAQUE IOHEXOL 350 MG/ML SOLN COMPARISON:  None. FINDINGS: Cardiovascular: Examination is limited by patient body habitus, streak artifact from the patient's arms, and suboptimal contrast bolus timing. Given these limitations,there is no pulmonary embolus. The main pulmonary artery is within normal limits for size. There is no CT evidence of acute right heart strain. There are atherosclerotic of the thoracic aorta. There are mild coronary artery calcifications. Heart size is slightly increased. There is no significant  pericardial effusion.  Mediastinum/Nodes: --No mediastinal or hilar lymphadenopathy. --No axillary lymphadenopathy. --No supraclavicular lymphadenopathy. --Normal thyroid gland. --The esophagus is unremarkable Lungs/Pleura: There are subtle ground-glass airspace opacities involving the peripheral right lower lobe and medial right lower lobe. The remaining lung fields are essentially clear. There is no pneumothorax. There is no large pleural effusion. Upper Abdomen: No acute abnormality. Musculoskeletal: No chest wall abnormality. No acute or significant osseous findings. Review of the MIP images confirms the above findings. IMPRESSION: 1. Limited study for the detection of pulmonary emboli as detailed above. Given these limitations, no definite pulmonary embolus was identified on today's exam. 2. Ground-glass airspace opacities involving the right lower lobe as detailed above. These could represent a developing infectious process. Consider a three-month follow-up CT to confirm resolution of this finding. 3. Mild cardiac enlargement. Aortic Atherosclerosis (ICD10-I70.0). Electronically Signed   By: Constance Holster M.D.   On: 02/16/2019 22:09    Procedures Procedures (including critical care time)  Medications Ordered in ED Medications  sodium chloride flush (NS) 0.9 % injection 3 mL (has no administration in time range)  aspirin chewable tablet 324 mg (324 mg Oral Given 02/16/19 2047)  morphine 4 MG/ML injection 6 mg (6 mg Intravenous Given 02/16/19 2047)  iohexol (OMNIPAQUE) 350 MG/ML injection 100 mL (100 mLs Intravenous Contrast Given 02/16/19 2159)     Initial Impression / Assessment and Plan / ED Course  I have reviewed the triage vital signs and the nursing notes.  Pertinent labs & imaging results that were available during my care of the patient were reviewed by me and considered in my medical decision making (see chart for details).     TERRIANNA HOLSCLAW is a 58 year old female with history of hypertension,  high cholesterol who presents to the ED with chest pain.  Patient with intermittent chest pain worse with exertion over the last several days.  Pain radiates to the left arm left jaw.  Pleuritic component to it.  No history of PE.  Patient with improvement following morphine and aspirin.  Heart score is 4.  First troponin normal.  Chest x-ray without signs of pneumonia, pneumothorax.  D-dimer elevated.  PE scan however shows no PE.  Patient with no infectious symptoms.  No fever.  No white count.  No significant anemia, electrolyte abnormality.  Given her heart score 4 will admit for further observation for ACS rule out.  Patient admitted to medicine in good condition.  This chart was dictated using voice recognition software.  Despite best efforts to proofread,  errors can occur which can change the documentation meaning.    Final Clinical Impressions(s) / ED Diagnoses   Final diagnoses:  Chest pain, unspecified type    ED Discharge Orders    None       Lennice Sites, DO 02/16/19 2259

## 2019-02-16 NOTE — ED Triage Notes (Signed)
Patient reports sudden onset pain under L breast at 1AM this morning, describes as sharp and constant since and pain worse when taking deep breaths. She states she had the same occur two times about 3-4 weeks ago. Endorses nausea with the pain, as well as headache and L sided neck pain. Denies shortness of breath, dizziness, vomiting. Resp e/u, skin w/d.

## 2019-02-16 NOTE — ED Notes (Signed)
Pharmacy in with pt will return they are finished

## 2019-02-16 NOTE — ED Notes (Signed)
Pt to xray via stretcher

## 2019-02-16 NOTE — ED Notes (Signed)
Dr K at bedside.

## 2019-02-16 NOTE — ED Notes (Signed)
Pt gone to CT will attempt blood draw upon pt return

## 2019-02-16 NOTE — ED Notes (Signed)
Pt returned from CT °

## 2019-02-17 ENCOUNTER — Encounter (HOSPITAL_COMMUNITY): Payer: Self-pay | Admitting: Internal Medicine

## 2019-02-17 ENCOUNTER — Other Ambulatory Visit: Payer: Self-pay

## 2019-02-17 ENCOUNTER — Observation Stay (HOSPITAL_BASED_OUTPATIENT_CLINIC_OR_DEPARTMENT_OTHER): Payer: No Typology Code available for payment source

## 2019-02-17 DIAGNOSIS — R079 Chest pain, unspecified: Secondary | ICD-10-CM

## 2019-02-17 DIAGNOSIS — E7849 Other hyperlipidemia: Secondary | ICD-10-CM

## 2019-02-17 DIAGNOSIS — I10 Essential (primary) hypertension: Secondary | ICD-10-CM

## 2019-02-17 DIAGNOSIS — R072 Precordial pain: Secondary | ICD-10-CM | POA: Diagnosis not present

## 2019-02-17 DIAGNOSIS — R0789 Other chest pain: Secondary | ICD-10-CM

## 2019-02-17 LAB — COMPREHENSIVE METABOLIC PANEL
ALT: 17 U/L (ref 0–44)
AST: 15 U/L (ref 15–41)
Albumin: 2.8 g/dL — ABNORMAL LOW (ref 3.5–5.0)
Alkaline Phosphatase: 64 U/L (ref 38–126)
Anion gap: 9 (ref 5–15)
BUN: 12 mg/dL (ref 6–20)
CO2: 28 mmol/L (ref 22–32)
Calcium: 8.4 mg/dL — ABNORMAL LOW (ref 8.9–10.3)
Chloride: 103 mmol/L (ref 98–111)
Creatinine, Ser: 0.81 mg/dL (ref 0.44–1.00)
GFR calc Af Amer: >60 mL/min (ref >60)
GFR calc non Af Amer: >60 mL/min (ref >60)
Glucose, Bld: 114 mg/dL — ABNORMAL HIGH (ref 70–99)
Potassium: 3.7 mmol/L (ref 3.5–5.1)
Sodium: 140 mmol/L (ref 135–145)
Total Bilirubin: 0.3 mg/dL (ref 0.3–1.2)
Total Protein: 5.8 g/dL — ABNORMAL LOW (ref 6.5–8.1)

## 2019-02-17 LAB — CBC WITH DIFFERENTIAL/PLATELET
Abs Immature Granulocytes: 0.02 10*3/uL (ref 0.00–0.07)
Basophils Absolute: 0 10*3/uL (ref 0.0–0.1)
Basophils Relative: 0 %
Eosinophils Absolute: 0.4 10*3/uL (ref 0.0–0.5)
Eosinophils Relative: 6 %
HCT: 37.7 % (ref 36.0–46.0)
Hemoglobin: 11.9 g/dL — ABNORMAL LOW (ref 12.0–15.0)
Immature Granulocytes: 0 %
Lymphocytes Relative: 38 %
Lymphs Abs: 2.7 10*3/uL (ref 0.7–4.0)
MCH: 28.5 pg (ref 26.0–34.0)
MCHC: 31.6 g/dL (ref 30.0–36.0)
MCV: 90.2 fL (ref 80.0–100.0)
Monocytes Absolute: 0.4 10*3/uL (ref 0.1–1.0)
Monocytes Relative: 6 %
Neutro Abs: 3.5 10*3/uL (ref 1.7–7.7)
Neutrophils Relative %: 50 %
Platelets: 208 10*3/uL (ref 150–400)
RBC: 4.18 MIL/uL (ref 3.87–5.11)
RDW: 13.7 % (ref 11.5–15.5)
WBC: 6.9 10*3/uL (ref 4.0–10.5)
nRBC: 0 % (ref 0.0–0.2)

## 2019-02-17 LAB — TROPONIN I (HIGH SENSITIVITY)
Troponin I (High Sensitivity): 5 ng/L (ref <18)
Troponin I (High Sensitivity): 4 ng/L (ref <18)

## 2019-02-17 LAB — HIV ANTIBODY (ROUTINE TESTING W REFLEX): HIV Screen 4th Generation wRfx: NONREACTIVE

## 2019-02-17 LAB — ECHOCARDIOGRAM COMPLETE
Height: 65 in
Weight: 4665.6 oz

## 2019-02-17 LAB — SARS CORONAVIRUS 2 BY RT PCR (HOSPITAL ORDER, PERFORMED IN ~~LOC~~ HOSPITAL LAB): SARS Coronavirus 2: NEGATIVE

## 2019-02-17 MED ORDER — IBUPROFEN 200 MG PO TABS: 400.0000 mg | ORAL_TABLET | Freq: Three times a day (TID) | ORAL | 0 refills | Status: DC | PRN

## 2019-02-17 MED ORDER — ASPIRIN EC 81 MG PO TBEC: 81.0000 mg | DELAYED_RELEASE_TABLET | Freq: Every day | ORAL | Status: DC

## 2019-02-17 MED ORDER — ACETAMINOPHEN 325 MG PO TABS: 650.0000 mg | ORAL_TABLET | Freq: Four times a day (QID) | ORAL | Status: DC | PRN

## 2019-02-17 MED ORDER — MORPHINE SULFATE (PF) 2 MG/ML IV SOLN
2.0000 mg | INTRAVENOUS | Status: DC | PRN
  Administered 2019-02-17: 2 mg via INTRAVENOUS
  Filled 2019-02-17: qty 1

## 2019-02-17 MED ORDER — GABAPENTIN 300 MG PO CAPS
300.0000 mg | ORAL_CAPSULE | Freq: Two times a day (BID) | ORAL | Status: DC
  Administered 2019-02-17: 300 mg via ORAL
  Filled 2019-02-17: qty 1

## 2019-02-17 MED ORDER — ATORVASTATIN CALCIUM 10 MG PO TABS
20.0000 mg | ORAL_TABLET | Freq: Every day | ORAL | Status: DC
  Administered 2019-02-17: 20 mg via ORAL
  Filled 2019-02-17: qty 2

## 2019-02-17 MED ORDER — ACETAMINOPHEN 650 MG RE SUPP: 650.0000 mg | Freq: Four times a day (QID) | RECTAL | Status: DC | PRN

## 2019-02-17 MED ORDER — ONDANSETRON HCL 4 MG PO TABS: 4.0000 mg | ORAL_TABLET | Freq: Four times a day (QID) | ORAL | Status: DC | PRN

## 2019-02-17 MED ORDER — ENOXAPARIN SODIUM 40 MG/0.4ML ~~LOC~~ SOLN
40.0000 mg | SUBCUTANEOUS | Status: DC
  Administered 2019-02-17: 40 mg via SUBCUTANEOUS
  Filled 2019-02-17: qty 0.4

## 2019-02-17 MED ORDER — LIDOCAINE 5 % EX OINT: TOPICAL_OINTMENT | CUTANEOUS | 3 refills | Status: DC

## 2019-02-17 MED ORDER — AMLODIPINE BESYLATE 5 MG PO TABS
5.0000 mg | ORAL_TABLET | Freq: Every day | ORAL | Status: DC
  Administered 2019-02-17: 5 mg via ORAL
  Filled 2019-02-17: qty 1

## 2019-02-17 MED ORDER — DULOXETINE HCL 60 MG PO CPEP
60.0000 mg | ORAL_CAPSULE | Freq: Every day | ORAL | Status: DC
  Filled 2019-02-17: qty 1

## 2019-02-17 MED ORDER — AMLODIPINE BESYLATE 5 MG PO TABS: 5.0000 mg | ORAL_TABLET | Freq: Every day | ORAL | 0 refills | Status: DC

## 2019-02-17 MED ORDER — NORTRIPTYLINE HCL 10 MG PO CAPS
20.0000 mg | ORAL_CAPSULE | Freq: Every day | ORAL | Status: DC
  Administered 2019-02-17: 20 mg via ORAL
  Filled 2019-02-17 (×2): qty 2

## 2019-02-17 MED ORDER — ONDANSETRON HCL 4 MG/2ML IJ SOLN: 4.0000 mg | Freq: Four times a day (QID) | INTRAMUSCULAR | Status: DC | PRN

## 2019-02-17 MED ORDER — ATORVASTATIN CALCIUM 20 MG PO TABS: 20.0000 mg | ORAL_TABLET | Freq: Every day | ORAL | 0 refills | Status: DC

## 2019-02-17 MED ORDER — CYCLOBENZAPRINE HCL 10 MG PO TABS: 10.0000 mg | ORAL_TABLET | Freq: Two times a day (BID) | ORAL | Status: DC | PRN

## 2019-02-17 NOTE — H&P (Signed)
History and Physical    LYNCOLN MASKELL QBV:[id number] DOB: [birth year] DOA: 02/16/2019  PCP: [facility]  Patient coming from: Home.  Chief Complaint: Chest pain.  HPI: Kelsey Santana is a 58 y.o. female with history of hypertension, hyperlipidemia, prediabetes presents to the ER with complaint of chest pain.  Patient states patient had some chest pain 3 weeks ago which was self-limited resolved.  Early yesterday morning around 1 AM while watching TV patient started developing chest pain which was left-sided radiating to her left breast was intense lasted for 15 minutes and resolved.  Again after a few minutes the same episode happened and resolved after few minutes.  Was associated with mild shortness of breath no cough fever chills palpitation diaphoresis.  Pain resolved and later started happening again evening 5 PM at this time the pain did not resolve decided to come to the ER.  Denies any abdominal pain has some nausea denies any diarrhea.  Ran out of her medication 2 months ago and has not got a refill.  ED Course: In the ER patient was afebrile not hypoxic CT angiogram of the chest were negative for pulmonary embolism did show some infiltrates but patient did not have any cough fever or any leukocytosis.  EKG shows normal sinus rhythm with nonspecific changes troponin is negative.  Patient chest pain improved with morphine but has not resolved.  On exam patient chest pain is reproducible by deep palpation of the sternum.  Review of Systems: As per HPI, rest all negative.   Past Medical History:  Diagnosis Date  . 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 GERD (gastroesophageal reflux disease)   . Hypercholesteremia   . Hypertension   . Migraine   . Pre-diabetes     Past Surgical History:  Procedure Laterality Date  . ABDOMINAL HYSTERECTOMY     complete  . COLONOSCOPY N/A 01/16/2017   Procedure: COLONOSCOPY;  Surgeon: Carol Ada, MD;  Location: [facility];   Service: Endoscopy;  Laterality: N/A;  . ESOPHAGOGASTRODUODENOSCOPY N/A 01/16/2017   Procedure: ESOPHAGOGASTRODUODENOSCOPY (EGD);  Surgeon: Carol Ada, MD;  Location: [facility];  Service: Endoscopy;  Laterality: N/A;     reports that she has quit smoking. She has quit using smokeless tobacco. She reports that she does not drink alcohol or use drugs.  Allergies  Allergen Reactions  . Ciprofloxacin Itching    itching  . Hydrocortisone Nausea Only  . Ace Inhibitors Cough    Family History  Problem Relation Age of Onset  . Alcohol abuse Mother   . Diabetes Mother   . Hyperlipidemia Mother   . Hypertension Mother   . Diabetes Brother   . Hyperlipidemia Brother   . Hypertension Brother   . Thyroid disease Neg Hx     Prior to Admission medications   Medication Sig Start Date End Date Taking? Authorizing Provider  amLODipine (NORVASC) 5 MG tablet Take 5 mg by mouth at bedtime.   Yes [provider]  atorvastatin (LIPITOR) 20 MG tablet Take 1 tablet (20 mg total) by mouth daily. Patient taking differently: Take 20 mg by mouth at bedtime.  11/15/15  Yes Hilty, Nadean Corwin, MD  cyclobenzaprine (FLEXERIL) 10 MG tablet Take 1 tablet (10 mg total) by mouth 2 (two) times daily as needed for muscle spasms. 10/19/17  Yes Khatri, Hina, PA-C  dihydroergotamine (MIGRANAL) 4 MG/ML nasal spray Place 1 spray into the nose as needed for migraine. Use in one nostril  as directed.  No more than 4 sprays in one hour 07/24/18  Yes Patel, Donika K, DO  DULoxetine (CYMBALTA) 60 MG capsule Take 60 mg by mouth daily.   Yes [provider]  gabapentin (NEURONTIN) 300 MG capsule Take 300 mg by mouth 2 (two) times daily.   Yes [provider]  ibuprofen (ADVIL) 200 MG tablet Take 400 mg by mouth every 6 (six) hours as needed for headache.   Yes [provider]  lidocaine (XYLOCAINE) 5 % ointment APPLY TO THIGH DAILY AS NEEDED FOR PAIN Patient taking differently: Apply 1  application topically daily as needed for mild pain.  05/25/17  Yes Patel, Donika K, DO  naproxen (NAPROSYN) 500 MG tablet Take 1 tablet (500 mg total) by mouth 2 (two) times daily. 10/19/17  Yes Khatri, Hina, PA-C  nortriptyline (PAMELOR) 10 MG capsule Start 10mg  at bedtime for 2 weeks, then increase to 2 tablet at bedtime Patient taking differently: Take 20 mg by mouth at bedtime.  06/28/18  Yes Patel, Donika K, DO  SUMAtriptan (IMITREX) 50 MG tablet Take 1 tablet (50 mg total) by mouth every 2 (two) hours as needed for migraine. May repeat in 2 hours if headache persists or recurs. 05/14/17  Yes Patel, Donika K, DO  traMADol (ULTRAM) 50 MG tablet Take 1 tablet (50 mg total) by mouth every 6 (six) hours as needed. 04/11/17  Yes Delo, Nathaneil Canary, MD  Vitamin D, Ergocalciferol, (DRISDOL) 1.25 MG (50000 UT) CAPS capsule Take 50,000 Units by mouth 3 (three) times a week.   Yes [provider]  dihydroergotamine (MIGRANAL) 4 MG/ML nasal spray Place 1 spray into the nose as needed for migraine. Use in one nostril as directed.  No more than 4 sprays in one hour 07/25/18   Alda Berthold, DO    Physical Exam: Vitals:   02/16/19 2100 02/16/19 2215 02/16/19 2315 02/17/19 0049  BP: 138/76  (!) 144/86 128/67  Pulse: 66 73 74 62  Resp: 17 19  20   Temp:    98.3 F (36.8 C)  TempSrc:    Oral  SpO2: 95% 100% 100% 96%      Constitutional: Moderately built and nourished. Vitals:   02/16/19 2100 02/16/19 2215 02/16/19 2315 02/17/19 0049  BP: 138/76  (!) 144/86 128/67  Pulse: 66 73 74 62  Resp: 17 19  20   Temp:    98.3 F (36.8 C)  TempSrc:    Oral  SpO2: 95% 100% 100% 96%   Eyes: Anicteric no pallor. ENMT: No discharge from the ears eyes nose and mouth. Neck: No mass felt.  No neck rigidity no JVD appreciated. Respiratory: No rhonchi or crepitations. Cardiovascular: S1-S2 heard. Abdomen: Soft nontender bowel tones present.  No guarding or rigidity. Musculoskeletal: No edema.  No joint  effusion. Skin: No rash. Neurologic: Alert awake oriented to time place and person.  Moves all extremities. Psychiatric: Appears normal per normal affect.   Labs on Admission: I have personally reviewed following labs and imaging studies  CBC: Recent Labs  Lab 02/16/19 1921  WBC 7.4  HGB 12.8  HCT 40.0  MCV 90.1  PLT 144   Basic Metabolic Panel: Recent Labs  Lab 02/16/19 1921  NA 144  K 4.0  CL 107  CO2 28  GLUCOSE 115*  BUN 12  CREATININE 0.94  CALCIUM 8.9   GFR: CrCl cannot be calculated (Unknown ideal weight.). Liver Function Tests: No results for input(s): AST, ALT, ALKPHOS, BILITOT, PROT, ALBUMIN in  the last 168 hours. No results for input(s): LIPASE, AMYLASE in the last 168 hours. No results for input(s): AMMONIA in the last 168 hours. Coagulation Profile: No results for input(s): INR, PROTIME in the last 168 hours. Cardiac Enzymes: No results for input(s): CKTOTAL, CKMB, CKMBINDEX, TROPONINI in the last 168 hours. BNP (last 3 results) No results for input(s): PROBNP in the last 8760 hours. HbA1C: No results for input(s): HGBA1C in the last 72 hours. CBG: No results for input(s): GLUCAP in the last 168 hours. Lipid Profile: No results for input(s): CHOL, HDL, LDLCALC, TRIG, CHOLHDL, LDLDIRECT in the last 72 hours. Thyroid Function Tests: No results for input(s): TSH, T4TOTAL, FREET4, T3FREE, THYROIDAB in the last 72 hours. Anemia Panel: No results for input(s): VITAMINB12, FOLATE, FERRITIN, TIBC, IRON, RETICCTPCT in the last 72 hours. Urine analysis:    Component Value Date/Time   COLORURINE YELLOW 05/17/2011 1554   APPEARANCEUR CLOUDY (A) 05/17/2011 1554   LABSPEC 1.023 05/17/2011 1554   PHURINE 5.5 05/17/2011 1554   GLUCOSEU NEGATIVE 05/17/2011 1554   HGBUR SMALL (A) 05/17/2011 1554   BILIRUBINUR NEGATIVE 05/17/2011 1554   KETONESUR NEGATIVE 05/17/2011 1554   PROTEINUR NEGATIVE 05/17/2011 1554   UROBILINOGEN 0.2 05/17/2011 1554   NITRITE  NEGATIVE 05/17/2011 1554   LEUKOCYTESUR SMALL (A) 05/17/2011 1554   Sepsis Labs: @LABRCNTIP (procalcitonin:4,lacticidven:4) ) Recent Results (from the past 240 hour(s))  SARS Coronavirus 2 (CEPHEID - Performed in [facility] hospital lab), Hosp Order     Status: None   Collection Time: 02/16/19 11:20 PM   Specimen: Nasopharyngeal Swab  Result Value Ref Range Status   SARS Coronavirus 2 NEGATIVE NEGATIVE Final    Comment: (NOTE) If result is NEGATIVE SARS-CoV-2 target nucleic acids are NOT DETECTED. The SARS-CoV-2 RNA is generally detectable in upper and lower  respiratory specimens during the acute phase of infection. The lowest  concentration of SARS-CoV-2 viral copies this assay can detect is 250  copies / mL. A negative result does not preclude SARS-CoV-2 infection  and should not be used as the sole basis for treatment or other  patient management decisions.  A negative result may occur with  improper specimen collection / handling, submission of specimen other  than nasopharyngeal swab, presence of viral mutation(s) within the  areas targeted by this assay, and inadequate number of viral copies  (<250 copies / mL). A negative result must be combined with clinical  observations, patient history, and epidemiological information. If result is POSITIVE SARS-CoV-2 target nucleic acids are DETECTED. The SARS-CoV-2 RNA is generally detectable in upper and lower  respiratory specimens dur ing the acute phase of infection.  Positive  results are indicative of active infection with SARS-CoV-2.  Clinical  correlation with patient history and other diagnostic information is  necessary to determine patient infection status.  Positive results do  not rule out bacterial infection or co-infection with other viruses. If result is PRESUMPTIVE POSTIVE SARS-CoV-2 nucleic acids MAY BE PRESENT.   A presumptive positive result was obtained on the submitted specimen  and confirmed on repeat  testing.  While 2019 novel coronavirus  (SARS-CoV-2) nucleic acids may be present in the submitted sample  additional confirmatory testing may be necessary for epidemiological  and / or clinical management purposes  to differentiate between  SARS-CoV-2 and other Sarbecovirus currently known to infect humans.  If clinically indicated additional testing with an alternate test  methodology [id number]) is advised. The SARS-CoV-2 RNA is generally  detectable in upper and lower respiratory sp  ecimens during the acute  phase of infection. The expected result is Negative. Fact Sheet for Patients:  StrictlyIdeas.no Fact Sheet for Healthcare Providers: BankingDealers.co.za This test is not yet approved or cleared by the [place] FDA and has been authorized for detection and/or diagnosis of SARS-CoV-2 by FDA under an Emergency Use Authorization (EUA).  This EUA will remain in effect (meaning this test can be used) for the duration of the COVID-19 declaration under Section 564(b)(1) of the Act, 21 U.S.C. section 360bbb-3(b)(1), unless the authorization is terminated or revoked sooner. Performed at [facility], [place] [address] [place], [place] [address]      Radiological Exams on Admission: Dg Chest 2 View  Result Date: 02/16/2019 CLINICAL DATA:  Left chest pain EXAM: CHEST - 2 VIEW COMPARISON:  04/11/2017 FINDINGS: Heart and mediastinal contours are within normal limits. No focal opacities or effusions. No acute bony abnormality. IMPRESSION: No active cardiopulmonary disease. Electronically Signed   By: Rolm Baptise M.D.   On: 02/16/2019 20:05   Ct Angio Chest Pe W And/or Wo Contrast  Result Date: 02/16/2019 CLINICAL DATA:  Chest pain. EXAM: CT ANGIOGRAPHY CHEST WITH CONTRAST TECHNIQUE: Multidetector CT imaging of the chest was performed using the standard protocol during bolus administration of intravenous contrast. Multiplanar CT image  reconstructions and MIPs were obtained to evaluate the vascular anatomy. CONTRAST:  140mL OMNIPAQUE IOHEXOL 350 MG/ML SOLN COMPARISON:  None. FINDINGS: Cardiovascular: Examination is limited by patient body habitus, streak artifact from the patient's arms, and suboptimal contrast bolus timing. Given these limitations,there is no pulmonary embolus. The main pulmonary artery is within normal limits for size. There is no CT evidence of acute right heart strain. There are atherosclerotic of the thoracic aorta. There are mild coronary artery calcifications. Heart size is slightly increased. There is no significant pericardial effusion. Mediastinum/Nodes: --No mediastinal or hilar lymphadenopathy. --No axillary lymphadenopathy. --No supraclavicular lymphadenopathy. --Normal thyroid gland. --The esophagus is unremarkable Lungs/Pleura: There are subtle ground-glass airspace opacities involving the peripheral right lower lobe and medial right lower lobe. The remaining lung fields are essentially clear. There is no pneumothorax. There is no large pleural effusion. Upper Abdomen: No acute abnormality. Musculoskeletal: No chest wall abnormality. No acute or significant osseous findings. Review of the MIP images confirms the above findings. IMPRESSION: 1. Limited study for the detection of pulmonary emboli as detailed above. Given these limitations, no definite pulmonary embolus was identified on today's exam. 2. Ground-glass airspace opacities involving the right lower lobe as detailed above. These could represent a developing infectious process. Consider a three-month follow-up CT to confirm resolution of this finding. 3. Mild cardiac enlargement. Aortic Atherosclerosis (ICD10-I70.0). Electronically Signed   By: Constance Holster M.D.   On: 02/16/2019 22:09    EKG: Independently reviewed.  Normal sinus rhythm nonspecific changes.  Assessment/Plan Principal Problem:   Chest pain Active Problems:   HTN (hypertension)    HLD (hyperlipidemia)   Depression   Morbid obesity due to excess calories ([place])    1. Chest pain -chest pain is reproducible on deep palpation appears atypical.  However given the risk factors hypertension hyperlipidemia we will cycle cardiac markers and closely observe in telemetry aspirin.  PRN morphine cardiology consult. 2. Hypertension we will restart patient's amlodipine.  Patient has not taken it for last 2 months. 3. Hyperlipidemia on statins. 4. History of depression on Cymbalta. 5. History of migraine.   DVT prophylaxis: Lovenox. Code Status: Full code. Family Communication: Discussed with patient. Disposition Plan: Home.  Consults called: Cardiology. Admission status: Observation.   Rise Patience MD [facility] Pager [phone].  If 7PM-7AM, please contact night-coverage [email or web address] Password [email or web address]  02/17/2019, 1:01 AM

## 2019-02-17 NOTE — Discharge Summary (Signed)
Physician Discharge Summary  Kelsey Santana QIW:[id number] DOB: 02/06/1961 DOA: 02/16/2019  PCP: [facility], [facility]  Admit date: 02/16/2019 Discharge date: 02/17/2019  Admitted From: Home Disposition: Home  Recommendations for Outpatient Follow-up:  1. Follow up with PCP at [facility] 2. Cardiology has arranged follow-up as outpatient.  Home Health: None Equipment/Devices: None  Discharge Condition: Fair CODE STATUS: Full code Diet recommendation: Heart Healthy     Discharge Diagnoses:  Principal Problem:   Chest pain, musculoskeletal   Active Problems:   HTN (hypertension)   HLD (hyperlipidemia)   Depression   Morbid obesity due to excess calories (HCC)  Brief narrative/HPI Please refer to admission H&P for details, in brief, 58 year old morbidly obese female with history of hypertension, hyperlipidemia and prediabetes presented with substernal chest pain on the morning of admission occasionally radiating to the left breast, sharp.  Denies any shortness of breath, palpitations, diaphoresis, nausea, vomiting, headache, fevers, chills, dizziness, bowel or urinary symptoms.  Patient reported that she ran out of her blood pressure meds 2 months ago and has not got a test to get refill and set up her own appointment with the [place].  Reports that she had lifted heavy bottle of water couple of days back. In the ED vitals were stable.  EKG and initial troponin negative.  Placed in observation for ACS rule out.  A CT angiogram of the chest done was negative for PE and showed some groundglass opacity in the lungs.  Patient was tested negative for COVID.  Hospital course Chest pain, Appears atypical and musculoskeletal given reproducible pain.  Serial troponins negative.  Stable on telemetry.  2D echo done.  As per cardiology preliminary read showed normal LVEF and no wall motion abnormality. Feels better this morning.  Stable to be discharged home with outpatient follow-up with cardiology.   Appears that she had normal stress test in 2017. Patient stable to be discharged home with outpatient cardiology follow-up.  Can use NSAIDs as needed for pain.  Also has Lidoderm ointment which I have prescribed.  Essential hypertension Has run out of her prescriptions since almost 2 months and unable to get appointment at [facility] since they are closed.  I have written her prescriptions for month for her blood pressure and cholesterol and she will try to schedule an appointment in the meantime.  Hyperlipidemia Continue statin.  CT findings of groundglass opacity in the lung. No respiratory symptoms and clinical suspicion for infection.  Morbid obesity (BMI 48.52 kg/m) Counseled on weight loss and exercise   Consults: Cardiology Procedures: 2D echo  Disposition: Home Family communication none   Discharge Instructions   Allergies as of 02/17/2019      Reactions   Ciprofloxacin Itching   itching   Hydrocortisone Nausea Only   Ace Inhibitors Cough      Medication List    TAKE these medications   amLODipine 5 MG tablet Commonly known as: NORVASC Take 1 tablet (5 mg total) by mouth at bedtime.   atorvastatin 20 MG tablet Commonly known as: LIPITOR Take 1 tablet (20 mg total) by mouth at bedtime.   cyclobenzaprine 10 MG tablet Commonly known as: FLEXERIL Take 1 tablet (10 mg total) by mouth 2 (two) times daily as needed for muscle spasms.   dihydroergotamine 4 MG/ML nasal spray Commonly known as: Migranal Place 1 spray into the nose as needed for migraine. Use in one nostril as directed.  No more than 4 sprays in one hour What changed: Another medication with the same  name was removed. Continue taking this medication, and follow the directions you see here.   DULoxetine 60 MG capsule Commonly known as: CYMBALTA Take 60 mg by mouth daily.   gabapentin 300 MG capsule Commonly known as: NEURONTIN Take 300 mg by mouth 2 (two) times daily.   ibuprofen 200 MG  tablet Commonly known as: ADVIL Take 2 tablets (400 mg total) by mouth every 8 (eight) hours as needed for headache. What changed: when to take this   lidocaine 5 % ointment Commonly known as: XYLOCAINE APPLY TO THIGH DAILY AS NEEDED FOR PAIN What changed: See the new instructions.   naproxen 500 MG tablet Commonly known as: NAPROSYN Take 1 tablet (500 mg total) by mouth 2 (two) times daily.   nortriptyline 10 MG capsule Commonly known as: PAMELOR Start 10mg  at bedtime for 2 weeks, then increase to 2 tablet at bedtime What changed:   how much to take  how to take this  when to take this  additional instructions   SUMAtriptan 50 MG tablet Commonly known as: IMITREX Take 1 tablet (50 mg total) by mouth every 2 (two) hours as needed for migraine. May repeat in 2 hours if headache persists or recurs.   traMADol 50 MG tablet Commonly known as: ULTRAM Take 1 tablet (50 mg total) by mouth every 6 (six) hours as needed.   Vitamin D (Ergocalciferol) 1.25 MG (50000 UT) Caps capsule Commonly known as: DRISDOL Take 50,000 Units by mouth 3 (three) times a week.      Follow-up Information    Clinic, [place]. Schedule an appointment as soon as possible for a visit in 1 week(s).   Contact information: [place] [address] [phone]        Charolette Forward, MD. Schedule an appointment as soon as possible for a visit in 3 week(s).   Specialty: Cardiology Contact information: [place] [address] [phone]          Allergies  Allergen Reactions  . Ciprofloxacin Itching    itching  . Hydrocortisone Nausea Only  . Ace Inhibitors Cough        Procedures/Studies: Dg Chest 2 View  Result Date: 02/16/2019 CLINICAL DATA:  Left chest pain EXAM: CHEST - 2 VIEW COMPARISON:  04/11/2017 FINDINGS: Heart and mediastinal contours are within normal limits. No focal opacities or effusions. No  acute bony abnormality. IMPRESSION: No active cardiopulmonary disease. Electronically Signed   By: Rolm Baptise M.D.   On: 02/16/2019 20:05   Ct Angio Chest Pe W And/or Wo Contrast  Result Date: 02/16/2019 CLINICAL DATA:  Chest pain. EXAM: CT ANGIOGRAPHY CHEST WITH CONTRAST TECHNIQUE: Multidetector CT imaging of the chest was performed using the standard protocol during bolus administration of intravenous contrast. Multiplanar CT image reconstructions and MIPs were obtained to evaluate the vascular anatomy. CONTRAST:  163mL OMNIPAQUE IOHEXOL 350 MG/ML SOLN COMPARISON:  None. FINDINGS: Cardiovascular: Examination is limited by patient body habitus, streak artifact from the patient's arms, and suboptimal contrast bolus timing. Given these limitations,there is no pulmonary embolus. The main pulmonary artery is within normal limits for size. There is no CT evidence of acute right heart strain. There are atherosclerotic of the thoracic aorta. There are mild coronary artery calcifications. Heart size is slightly increased. There is no significant pericardial effusion. Mediastinum/Nodes: --No mediastinal or hilar lymphadenopathy. --No axillary lymphadenopathy. --No supraclavicular lymphadenopathy. --Normal thyroid gland. --The esophagus is unremarkable Lungs/Pleura: There are subtle ground-glass airspace opacities involving  the peripheral right lower lobe and medial right lower lobe. The remaining lung fields are essentially clear. There is no pneumothorax. There is no large pleural effusion. Upper Abdomen: No acute abnormality. Musculoskeletal: No chest wall abnormality. No acute or significant osseous findings. Review of the MIP images confirms the above findings. IMPRESSION: 1. Limited study for the detection of pulmonary emboli as detailed above. Given these limitations, no definite pulmonary embolus was identified on today's exam. 2. Ground-glass airspace opacities involving the right lower lobe as detailed above.  These could represent a developing infectious process. Consider a three-month follow-up CT to confirm resolution of this finding. 3. Mild cardiac enlargement. Aortic Atherosclerosis (ICD10-I70.0). Electronically Signed   By: Constance Holster M.D.   On: 02/16/2019 22:09       Subjective: Chest pain is better.  No overnight events.  Discharge Exam: Vitals:   02/17/19 0132 02/17/19 0550  BP: 124/82 111/76  Pulse:  (!) 58  Resp:    Temp:  (!) 97.5 F (36.4 C)  SpO2:  95%   Vitals:   02/17/19 0049 02/17/19 0132 02/17/19 0550 02/17/19 0714  BP: 128/67 124/82 111/76   Pulse: 62  (!) 58   Resp: 20     Temp: 98.3 F (36.8 C)  (!) 97.5 F (36.4 C)   TempSrc: Oral  Oral   SpO2: 96%  95%   Weight: 134.3 kg   132.3 kg  Height: 5\' 5"  (1.651 m)       General: Morbidly obese female not in distress HEENT: Moist mucosa, supple neck Chest: Reproducible pain on pressure CVs: Normal S1-S2, no murmurs GI: Soft, nondistended, nontender Musculoskeletal: Warm, no edema     The results of significant diagnostics from this hospitalization (including imaging, microbiology, ancillary and laboratory) are listed below for reference.     Microbiology: Recent Results (from the past 240 hour(s))  SARS Coronavirus 2 (CEPHEID - Performed in [place] hospital lab), Hosp Order     Status: None   Collection Time: 02/16/19 11:20 PM   Specimen: Nasopharyngeal Swab  Result Value Ref Range Status   SARS Coronavirus 2 NEGATIVE NEGATIVE Final    Comment: (NOTE) If result is NEGATIVE SARS-CoV-2 target nucleic acids are NOT DETECTED. The SARS-CoV-2 RNA is generally detectable in upper and lower  respiratory specimens during the acute phase of infection. The lowest  concentration of SARS-CoV-2 viral copies this assay can detect is 250  copies / mL. A negative result does not preclude SARS-CoV-2 infection  and should not be used as the sole basis for treatment or other  patient management decisions.   A negative result may occur with  improper specimen collection / handling, submission of specimen other  than nasopharyngeal swab, presence of viral mutation(s) within the  areas targeted by this assay, and inadequate number of viral copies  (<250 copies / mL). A negative result must be combined with clinical  observations, patient history, and epidemiological information. If result is POSITIVE SARS-CoV-2 target nucleic acids are DETECTED. The SARS-CoV-2 RNA is generally detectable in upper and lower  respiratory specimens dur ing the acute phase of infection.  Positive  results are indicative of active infection with SARS-CoV-2.  Clinical  correlation with patient history and other diagnostic information is  necessary to determine patient infection status.  Positive results do  not rule out bacterial infection or co-infection with other viruses. If result is PRESUMPTIVE POSTIVE SARS-CoV-2 nucleic acids MAY BE PRESENT.   A presumptive positive result was obtained  on the submitted specimen  and confirmed on repeat testing.  While 2019 novel coronavirus  (SARS-CoV-2) nucleic acids may be present in the submitted sample  additional confirmatory testing may be necessary for epidemiological  and / or clinical management purposes  to differentiate between  SARS-CoV-2 and other Sarbecovirus currently known to infect humans.  If clinically indicated additional testing with an alternate test  methodology [phone]) is advised. The SARS-CoV-2 RNA is generally  detectable in upper and lower respiratory sp ecimens during the acute  phase of infection. The expected result is Negative. Fact Sheet for Patients:  StrictlyIdeas.no Fact Sheet for Healthcare Providers: BankingDealers.co.za This test is not yet approved or cleared by the [place] FDA and has been authorized for detection and/or diagnosis of SARS-CoV-2 by FDA under an Emergency Use  Authorization (EUA).  This EUA will remain in effect (meaning this test can be used) for the duration of the COVID-19 declaration under Section 564(b)(1) of the Act, 21 U.S.C. section 360bbb-3(b)(1), unless the authorization is terminated or revoked sooner. Performed at [facility], [place] [address], [place], [place] [address]      Labs: BNP (last 3 results) No results for input(s): BNP in the last 8760 hours. Basic Metabolic Panel: Recent Labs  Lab 02/16/19 1921 02/17/19 0435  NA 144 140  K 4.0 3.7  CL 107 103  CO2 28 28  GLUCOSE 115* 114*  BUN 12 12  CREATININE 0.94 0.81  CALCIUM 8.9 8.4*   Liver Function Tests: Recent Labs  Lab 02/17/19 0435  AST 15  ALT 17  ALKPHOS 64  BILITOT 0.3  PROT 5.8*  ALBUMIN 2.8*   No results for input(s): LIPASE, AMYLASE in the last 168 hours. No results for input(s): AMMONIA in the last 168 hours. CBC: Recent Labs  Lab 02/16/19 1921 02/17/19 0435  WBC 7.4 6.9  NEUTROABS  --  3.5  HGB 12.8 11.9*  HCT 40.0 37.7  MCV 90.1 90.2  PLT 237 208   Cardiac Enzymes: No results for input(s): CKTOTAL, CKMB, CKMBINDEX, TROPONINI in the last 168 hours. BNP: Invalid input(s): POCBNP CBG: No results for input(s): GLUCAP in the last 168 hours. D-Dimer Recent Labs    02/16/19 1921  DDIMER 0.60*   Hgb A1c No results for input(s): HGBA1C in the last 72 hours. Lipid Profile No results for input(s): CHOL, HDL, LDLCALC, TRIG, CHOLHDL, LDLDIRECT in the last 72 hours. Thyroid function studies No results for input(s): TSH, T4TOTAL, T3FREE, THYROIDAB in the last 72 hours.  Invalid input(s): FREET3 Anemia work up No results for input(s): VITAMINB12, FOLATE, FERRITIN, TIBC, IRON, RETICCTPCT in the last 72 hours. Urinalysis    Component Value Date/Time   COLORURINE YELLOW 05/17/2011 1554   APPEARANCEUR CLOUDY (A) 05/17/2011 1554   LABSPEC 1.023 05/17/2011 1554   PHURINE 5.5 05/17/2011 1554   GLUCOSEU NEGATIVE 05/17/2011 1554    HGBUR SMALL (A) 05/17/2011 1554   BILIRUBINUR NEGATIVE 05/17/2011 1554   KETONESUR NEGATIVE 05/17/2011 1554   PROTEINUR NEGATIVE 05/17/2011 1554   UROBILINOGEN 0.2 05/17/2011 1554   NITRITE NEGATIVE 05/17/2011 1554   LEUKOCYTESUR SMALL (A) 05/17/2011 1554   Sepsis Labs Invalid input(s): PROCALCITONIN,  WBC,  LACTICIDVEN Microbiology Recent Results (from the past 240 hour(s))  SARS Coronavirus 2 (CEPHEID - Performed in [place] hospital lab), Hosp Order     Status: None   Collection Time: 02/16/19 11:20 PM   Specimen: Nasopharyngeal Swab  Result Value Ref Range Status   SARS Coronavirus 2 NEGATIVE  NEGATIVE Final    Comment: (NOTE) If result is NEGATIVE SARS-CoV-2 target nucleic acids are NOT DETECTED. The SARS-CoV-2 RNA is generally detectable in upper and lower  respiratory specimens during the acute phase of infection. The lowest  concentration of SARS-CoV-2 viral copies this assay can detect is 250  copies / mL. A negative result does not preclude SARS-CoV-2 infection  and should not be used as the sole basis for treatment or other  patient management decisions.  A negative result may occur with  improper specimen collection / handling, submission of specimen other  than nasopharyngeal swab, presence of viral mutation(s) within the  areas targeted by this assay, and inadequate number of viral copies  (<250 copies / mL). A negative result must be combined with clinical  observations, patient history, and epidemiological information. If result is POSITIVE SARS-CoV-2 target nucleic acids are DETECTED. The SARS-CoV-2 RNA is generally detectable in upper and lower  respiratory specimens dur ing the acute phase of infection.  Positive  results are indicative of active infection with SARS-CoV-2.  Clinical  correlation with patient history and other diagnostic information is  necessary to determine patient infection status.  Positive results do  not rule out bacterial infection or  co-infection with other viruses. If result is PRESUMPTIVE POSTIVE SARS-CoV-2 nucleic acids MAY BE PRESENT.   A presumptive positive result was obtained on the submitted specimen  and confirmed on repeat testing.  While 2019 novel coronavirus  (SARS-CoV-2) nucleic acids may be present in the submitted sample  additional confirmatory testing may be necessary for epidemiological  and / or clinical management purposes  to differentiate between  SARS-CoV-2 and other Sarbecovirus currently known to infect humans.  If clinically indicated additional testing with an alternate test  methodology [phone]) is advised. The SARS-CoV-2 RNA is generally  detectable in upper and lower respiratory sp ecimens during the acute  phase of infection. The expected result is Negative. Fact Sheet for Patients:  StrictlyIdeas.no Fact Sheet for Healthcare Providers: BankingDealers.co.za This test is not yet approved or cleared by the [place] FDA and has been authorized for detection and/or diagnosis of SARS-CoV-2 by FDA under an Emergency Use Authorization (EUA).  This EUA will remain in effect (meaning this test can be used) for the duration of the COVID-19 declaration under Section 564(b)(1) of the Act, 21 U.S.C. section 360bbb-3(b)(1), unless the authorization is terminated or revoked sooner. Performed at [facility], [place] [address], [place], [place] [address]      Time coordinating discharge: <30 minutes  SIGNED:   Louellen Molder, MD  [facility] 02/17/2019, 10:54 AM Pager   If 7PM-7AM, please contact night-coverage [email or web address] Password [email or web address]

## 2019-02-17 NOTE — Consult Note (Addendum)
Cardiology Consultation:   Patient ID: Kelsey Santana MRN: [id number]; DOB: 04/02/1961  Admit date: 02/16/2019 Date of Consult: 02/17/2019  Primary Care Provider: [facility], [facility] Primary Cardiologist: Dr. Debara Pickett   Patient Profile:   Kelsey Santana is a 58 y.o. female with a hx of HTN, HLD and obesity who is being seen today for the evaluation of chest pain  at the request of Dr. Hal Hope.   Previously seen by Dr. Felton Clinton at [facility] in 2011 for chest pain. Cardiac cath demonstrated mild one-vessel coronary artery disease with a 20% stenosis in the mid and distal LAD. Otherwise there was no significant obstructive coronary disease. She says she may have had a stress test prior to that and is not clear whether that was abnormal or perhaps false positive.  Seen by Dr. Debara Pickett once 12/2015 for chest pain. Follow up stress test was normal.   History of Present Illness:   Kelsey Santana presented with waxing and waning chest pain.  Initial episode occurred 3 weeks ago.  Yesterday morning, at 1 AM, she had chest pain while watching TV. This radiated to her left neck which spontaneously resolved in 20 minutes.  She had a recurrent episode yesterday afternoon while driving and came to ER for further evaluation.  She described her chest pain as a sharp pressure-like sensation at lower sternal area radiating to her left shoulder and neck.  Associated with nausea but no vomiting or diaphoresis.  Each episode lasted about 20 minutes with self resolution.  No heavy lifting.  Denies patient, syncope, orthopnea or PND.  Intermittent ankle edema.  Patient reports running out of her antihypertensive and cholesterol medication approximately 1 month ago.  Her blood pressure was 200s/100s while off medications.  Here, blood pressure normal.  High-sensitivity troponin 4>>4>>5>>4.  Hemoglobin normal.  D-dimer 0.6.  CT angiogram of chest without PE however showed ground glass  opacity.  Mild coronary calcification.  Patient continues to have intermittent chest pain while admitted.  Her pain is reproducible with palpation chest.  Heart Pathway Score:     Past Medical History:  Diagnosis Date   Back pain    l 4 and l5 djd   GERD (gastroesophageal reflux disease)    Hypercholesteremia    Hypertension    Migraine    Pre-diabetes     Past Surgical History:  Procedure Laterality Date   ABDOMINAL HYSTERECTOMY     complete   COLONOSCOPY N/A 01/16/2017   Procedure: COLONOSCOPY;  Surgeon: Carol Ada, MD;  Location: [facility];  Service: Endoscopy;  Laterality: N/A;   ESOPHAGOGASTRODUODENOSCOPY N/A 01/16/2017   Procedure: ESOPHAGOGASTRODUODENOSCOPY (EGD);  Surgeon: Carol Ada, MD;  Location: [facility];  Service: Endoscopy;  Laterality: N/A;     Inpatient Medications: Scheduled Meds:  amLODipine  5 mg Oral QHS   aspirin EC  81 mg Oral Daily   atorvastatin  20 mg Oral QHS   DULoxetine  60 mg Oral Daily   enoxaparin (LOVENOX) injection  40 mg Subcutaneous Q24H   gabapentin  300 mg Oral BID   nortriptyline  20 mg Oral QHS   Continuous Infusions:  PRN Meds: acetaminophen **OR** acetaminophen, cyclobenzaprine, morphine injection, ondansetron **OR** ondansetron (ZOFRAN) IV  Allergies:    Allergies  Allergen Reactions   Ciprofloxacin Itching    itching   Hydrocortisone Nausea Only   Ace Inhibitors Cough    Social History:   Social History   Socioeconomic History   Marital status: Single  Spouse name: Not on file   Number of children: Not on file   Years of education: Not on file   Highest education level: Not on file  Occupational History   Not on file  Social Needs   Financial resource strain: Not on file   Food insecurity    Worry: Not on file    Inability: Not on file   Transportation needs    Medical: Not on file    Non-medical: Not on file  Tobacco Use   Smoking status: Former Smoker    Smokeless tobacco: Former Systems developer   Tobacco comment: quit 28 yrs ago  Substance and Sexual Activity   Alcohol use: No   Drug use: No   Sexual activity: Never  Lifestyle   Physical activity    Days per week: Not on file    Minutes per session: Not on file   Stress: Not on file  Relationships   Social connections    Talks on phone: Not on file    Gets together: Not on file    Attends religious service: Not on file    Active member of club or organization: Not on file    Attends meetings of clubs or organizations: Not on file    Relationship status: Not on file   Intimate partner violence    Fear of current or ex partner: Not on file    Emotionally abused: Not on file    Physically abused: Not on file    Forced sexual activity: Not on file  Other Topics Concern   Not on file  Social History Narrative   epworth sleepiness scale = 10 (11/15/15)    Lives alone in a 2 story home.  Has 3 children.  Currently not working.  Has lost 6 jobs in the past 2 months due to her leg numbness and pain.     Education: 2 years of college.    Family History:   Family History  Problem Relation Age of Onset   Alcohol abuse Mother    Diabetes Mother    Hyperlipidemia Mother    Hypertension Mother    Diabetes Brother    Hyperlipidemia Brother    Hypertension Brother    Thyroid disease Neg Hx      ROS:  Please see the history of present illness.  All other ROS reviewed and negative.     Physical Exam/Data:   Vitals:   02/17/19 0049 02/17/19 0132 02/17/19 0550 02/17/19 0714  BP: 128/67 124/82 111/76   Pulse: 62  (!) 58   Resp: 20     Temp: 98.3 F (36.8 C)  (!) 97.5 F (36.4 C)   TempSrc: Oral  Oral   SpO2: 96%  95%   Weight: 134.3 kg   132.3 kg  Height: 5\' 5"  (1.651 m)      No intake or output data in the 24 hours ending 02/17/19 0916 Last 3 Weights 02/17/2019 02/17/2019 06/28/2018  Weight (lbs) 291 lb 9.6 oz 296 lb 1.6 oz 300 lb 2 oz  Weight (kg) 132.269 kg 134.31 kg  136.136 kg     Body mass index is 48.52 kg/m.  General:  Obese female in no acute distress HEENT: normal Lymph: no adenopathy Neck: no JVD Endocrine:  No thryomegaly Vascular: No carotid bruits; FA pulses 2+ bilaterally without bruits  Cardiac:  normal S1, S2; RRR; no murmur  Lungs:  clear to auscultation bilaterally, no wheezing, rhonchi or rales  Abd: soft, nontender, no hepatomegaly  Ext: no edema Musculoskeletal:  No deformities, BUE and BLE strength normal and equal Skin: warm and dry  Neuro:  CNs 2-12 intact, no focal abnormalities noted Psych:  Normal affect   EKG:  The EKG was personally reviewed and demonstrates:  Sinus rhythm at rate of 78 bpm, PVCs Telemetry:  Telemetry was personally reviewed and demonstrates:  Sinus rhythm at 60s with PVCs  Relevant CV Studies: Stress test 11/2015   Nuclear stress EF: 64%.  The left ventricular ejection fraction is normal (55-65%).  There was no ST segment deviation noted during stress.  The study is normal.   Normal stress nuclear study with no ischemia or infarction; EF 64.  Laboratory Data:  High Sensitivity Troponin:   Recent Labs  Lab 02/16/19 1921 02/16/19 2216 02/17/19 0222 02/17/19 0435  TROPONINIHS 4 4 5 4      Chemistry Recent Labs  Lab 02/16/19 1921 02/17/19 0435  NA 144 140  K 4.0 3.7  CL 107 103  CO2 28 28  GLUCOSE 115* 114*  BUN 12 12  CREATININE 0.94 0.81  CALCIUM 8.9 8.4*  GFRNONAA >60 >60  GFRAA >60 >60  ANIONGAP 9 9    Recent Labs  Lab 02/17/19 0435  PROT 5.8*  ALBUMIN 2.8*  AST 15  ALT 17  ALKPHOS 64  BILITOT 0.3   Hematology Recent Labs  Lab 02/16/19 1921 02/17/19 0435  WBC 7.4 6.9  RBC 4.44 4.18  HGB 12.8 11.9*  HCT 40.0 37.7  MCV 90.1 90.2  MCH 28.8 28.5  MCHC 32.0 31.6  RDW 13.6 13.7  PLT 237 208   BNPNo results for input(s): BNP, PROBNP in the last 168 hours.  DDimer  Recent Labs  Lab 02/16/19 1921  DDIMER 0.60*     Radiology/Studies:  Dg Chest 2  View  Result Date: 02/16/2019 CLINICAL DATA:  Left chest pain EXAM: CHEST - 2 VIEW COMPARISON:  04/11/2017 FINDINGS: Heart and mediastinal contours are within normal limits. No focal opacities or effusions. No acute bony abnormality. IMPRESSION: No active cardiopulmonary disease. Electronically Signed   By: Rolm Baptise M.D.   On: 02/16/2019 20:05   Ct Angio Chest Pe W And/or Wo Contrast  Result Date: 02/16/2019 CLINICAL DATA:  Chest pain. EXAM: CT ANGIOGRAPHY CHEST WITH CONTRAST TECHNIQUE: Multidetector CT imaging of the chest was performed using the standard protocol during bolus administration of intravenous contrast. Multiplanar CT image reconstructions and MIPs were obtained to evaluate the vascular anatomy. CONTRAST:  130mL OMNIPAQUE IOHEXOL 350 MG/ML SOLN COMPARISON:  None. FINDINGS: Cardiovascular: Examination is limited by patient body habitus, streak artifact from the patient's arms, and suboptimal contrast bolus timing. Given these limitations,there is no pulmonary embolus. The main pulmonary artery is within normal limits for size. There is no CT evidence of acute right heart strain. There are atherosclerotic of the thoracic aorta. There are mild coronary artery calcifications. Heart size is slightly increased. There is no significant pericardial effusion. Mediastinum/Nodes: --No mediastinal or hilar lymphadenopathy. --No axillary lymphadenopathy. --No supraclavicular lymphadenopathy. --Normal thyroid gland. --The esophagus is unremarkable Lungs/Pleura: There are subtle ground-glass airspace opacities involving the peripheral right lower lobe and medial right lower lobe. The remaining lung fields are essentially clear. There is no pneumothorax. There is no large pleural effusion. Upper Abdomen: No acute abnormality. Musculoskeletal: No chest wall abnormality. No acute or significant osseous findings. Review of the MIP images confirms the above findings. IMPRESSION: 1. Limited study for the  detection of pulmonary emboli as detailed above. Given these  limitations, no definite pulmonary embolus was identified on today's exam. 2. Ground-glass airspace opacities involving the right lower lobe as detailed above. These could represent a developing infectious process. Consider a three-month follow-up CT to confirm resolution of this finding. 3. Mild cardiac enlargement. Aortic Atherosclerosis (ICD10-I70.0). Electronically Signed   By: Constance Holster M.D.   On: 02/16/2019 22:09    Assessment and Plan:   1. Chest pain - Her symptoms is atypical. Reproducible with palpitations. Recently elevated BP while run out of medications. Troponin negative. EKG without acute ischemic changes. Prior cath and stress test reassuring.  Preliminary review of echocardiogram with Dr. Kelsi Benham>> normal LV function without wall motion abnormality.  If formal reading reassuring, she can be discharged on cardiac standpoint with outpatient follow-up with Dr. Debara Pickett. -Musculoskeletal work-up per primary team  2.  Hypertension -Normalized since on her medication   3.  Morbid obesity/snoring -Consider outpatient sleep study  4.  Hyperlipidemia -Continue statin   For questions or updates, please contact [place] Please consult [email or web address] for contact info under     SignedLeanor Kail, PA  02/17/2019 9:16 AM  ---------------------------------------------------------------------------------------------   History and all data above reviewed.  Patient examined.  I agree with the findings as above.  [name] is feeling ok. She is chest pain free but notes tenderness to palpation of her precordium. She is requesting medication for chest discomfort, antihypertensives and nitroglycerin appropriate from cardiovascular perspective.   Constitutional: No acute distress Eyes: pupils equally round and reactive to light, sclera non-icteric, normal conjunctiva and lids ENMT: normal dentition,  moist mucous membranes Cardiovascular: regular rhythm, normal rate, no murmurs. S1 and S2 normal. Radial pulses normal bilaterally. No jugular venous distention.  Respiratory: clear to auscultation bilaterally GI : normal bowel sounds, soft and nontender. No distention.   MSK: extremities warm, well perfused. No edema.  NEURO: grossly nonfocal exam, moves all extremities. PSYCH: alert and oriented x 3, normal mood and affect.   All available labs, radiology testing, previous records reviewed. Agree with documented assessment and plan of my colleague as stated above with the following additions or changes:  Principal Problem:   Chest pain Active Problems:   HTN (hypertension)   HLD (hyperlipidemia)   Depression   Morbid obesity due to excess calories (HCC)   Chest pain, musculoskeletal    Plan:  Chest pain - preliminary review of echocardiogram shows normal LV function and no significant regional wall motion abnormalities. Will await formal interpretation prior to dismissal, but overall reassuring course at this time, relatively unremarkable troponins.   BP improved on therapy, continue amlodipine 5 mg daily. Will likely need multidrug regimen since BP elevated to 200 mmHg on presentation. Currently normal and further therapy can be added as outpatient.   HLD - continue statin.   Recommend outpatient follow up with Dr. Debara Pickett her cardiologist.   [facility] will sign off.   Medication Recommendations: amlodipine 5 mg daily Other recommendations (labs, testing, etc):  ASA 81 mg daily, atorvastatin 20 mg daily  Follow up as an outpatient: [place] in 1-2 weeks.   Length of Stay:  LOS: 0 days   Elouise Munroe, MD [facility] 11:42 AM  02/17/2019

## 2019-02-17 NOTE — Progress Notes (Signed)
  Echocardiogram 2D Echocardiogram has been performed.  Johny Chess 02/17/2019, 9:10 AM

## 2019-02-17 NOTE — Care Management (Signed)
02-17-19 [facility] contacted and made aware that patient was hospitalized. CM spoke with [place] because patient has been out of medications. CM did speak with patient unaware of PCP at [place]. Patient sees Dr. Charlton Haws at [facility]. CM received verbal permission from patient to fax information to the [place] spoke wit Aniceto Boss and patient can pick up medications today at no cost. No further needs from CM at this time. Bethena Roys, RN,BSN Case Manager [phone]

## 2019-02-17 NOTE — Discharge Instructions (Signed)
Chest Wall Pain Chest wall pain is pain in or around the bones and muscles of your chest. Chest wall pain may be caused by:  An injury.  Coughing a lot.  Using your chest and arm muscles too much. Sometimes, the cause may not be known. This pain may take a few weeks or longer to get better. Follow these instructions at home: Managing pain, stiffness, and swelling If told, put ice on the painful area:  Put ice in a plastic bag.  Place a towel between your skin and the bag.  Leave the ice on for 20 minutes, 2-3 times a day.  Activity  Rest as told by your doctor.  Avoid doing things that cause pain. This includes lifting heavy items.  Ask your doctor what activities are safe for you. General instructions   Take over-the-counter and prescription medicines only as told by your doctor.  Do not use any products that contain nicotine or tobacco, such as cigarettes, e-cigarettes, and chewing tobacco. If you need help quitting, ask your doctor.  Keep all follow-up visits as told by your doctor. This is important. Contact a doctor if:  You have a fever.  Your chest pain gets worse.  You have new symptoms. Get help right away if:  You feel sick to your stomach (nauseous) or you throw up (vomit).  You feel sweaty or light-headed.  You have a cough with mucus from your lungs (sputum) or you cough up blood.  You are short of breath. These symptoms may be an emergency. Do not wait to see if the symptoms will go away. Get medical help right away. Call your local emergency services (911 in the [place] Do not drive yourself to the hospital. Summary  Chest wall pain is pain in or around the bones and muscles of your chest.  It may be treated with ice, rest, and medicines. Your condition may also get better if you avoid doing things that cause pain.  Contact a doctor if you have a fever, chest pain that gets worse, or new symptoms.  Get help right away if you feel light-headed  or you get short of breath. These symptoms may be an emergency. This information is not intended to replace advice given to you by your health care provider. Make sure you discuss any questions you have with your health care provider. Document Released: 01/24/2008 Document Revised: 02/07/2018 Document Reviewed: 02/07/2018 Elsevier Patient Education  2020 [employer].

## 2019-02-19 ENCOUNTER — Emergency Department (HOSPITAL_COMMUNITY): Payer: No Typology Code available for payment source

## 2019-02-19 ENCOUNTER — Telehealth: Payer: Self-pay | Admitting: Internal Medicine

## 2019-02-19 ENCOUNTER — Emergency Department (HOSPITAL_COMMUNITY)
Admission: EM | Admit: 2019-02-19 | Discharge: 2019-02-19 | Disposition: A | Discharge status: Home / Self Care | Payer: No Typology Code available for payment source | Attending: Emergency Medicine | Admitting: Emergency Medicine

## 2019-02-19 ENCOUNTER — Other Ambulatory Visit: Payer: Self-pay | Admitting: Endocrinology

## 2019-02-19 ENCOUNTER — Other Ambulatory Visit: Payer: Self-pay

## 2019-02-19 DIAGNOSIS — R079 Chest pain, unspecified: Secondary | ICD-10-CM

## 2019-02-19 DIAGNOSIS — I10 Essential (primary) hypertension: Secondary | ICD-10-CM | POA: Diagnosis not present

## 2019-02-19 DIAGNOSIS — R0602 Shortness of breath: Secondary | ICD-10-CM | POA: Insufficient documentation

## 2019-02-19 DIAGNOSIS — Z79899 Other long term (current) drug therapy: Secondary | ICD-10-CM | POA: Diagnosis not present

## 2019-02-19 DIAGNOSIS — R110 Nausea: Secondary | ICD-10-CM | POA: Insufficient documentation

## 2019-02-19 DIAGNOSIS — R0789 Other chest pain: Secondary | ICD-10-CM | POA: Diagnosis present

## 2019-02-19 DIAGNOSIS — R51 Headache: Secondary | ICD-10-CM | POA: Insufficient documentation

## 2019-02-19 DIAGNOSIS — Z5320 Procedure and treatment not carried out because of patient's decision for unspecified reasons: Secondary | ICD-10-CM | POA: Diagnosis not present

## 2019-02-19 DIAGNOSIS — R072 Precordial pain: Secondary | ICD-10-CM | POA: Insufficient documentation

## 2019-02-19 LAB — BASIC METABOLIC PANEL
Anion gap: 9 (ref 5–15)
BUN: 12 mg/dL (ref 6–20)
CO2: 27 mmol/L (ref 22–32)
Calcium: 9.2 mg/dL (ref 8.9–10.3)
Chloride: 103 mmol/L (ref 98–111)
Creatinine, Ser: 0.81 mg/dL (ref 0.44–1.00)
GFR calc Af Amer: >60 mL/min (ref >60)
GFR calc non Af Amer: >60 mL/min (ref >60)
Glucose, Bld: 104 mg/dL — ABNORMAL HIGH (ref 70–99)
Potassium: 3.8 mmol/L (ref 3.5–5.1)
Sodium: 139 mmol/L (ref 135–145)

## 2019-02-19 LAB — D-DIMER, QUANTITATIVE: D-Dimer, Quant: 0.89 ug/mL-FEU — ABNORMAL HIGH (ref 0.00–0.50)

## 2019-02-19 LAB — CBC
HCT: 42.8 % (ref 36.0–46.0)
Hemoglobin: 13.6 g/dL (ref 12.0–15.0)
MCH: 28.6 pg (ref 26.0–34.0)
MCHC: 31.8 g/dL (ref 30.0–36.0)
MCV: 89.9 fL (ref 80.0–100.0)
Platelets: 202 10*3/uL (ref 150–400)
RBC: 4.76 MIL/uL (ref 3.87–5.11)
RDW: 13.3 % (ref 11.5–15.5)
WBC: 5.6 10*3/uL (ref 4.0–10.5)
nRBC: 0 % (ref 0.0–0.2)

## 2019-02-19 LAB — CBG MONITORING, ED: Glucose-Capillary: 103 mg/dL — ABNORMAL HIGH (ref 70–99)

## 2019-02-19 LAB — TROPONIN I (HIGH SENSITIVITY): Troponin I (High Sensitivity): 5 ng/L (ref <18)

## 2019-02-19 LAB — I-STAT BETA HCG BLOOD, ED (MC, WL, AP ONLY): I-stat hCG, quantitative: <5 m[IU]/mL (ref <5)

## 2019-02-19 MED ORDER — ASPIRIN 81 MG PO CHEW
324.0000 mg | CHEWABLE_TABLET | Freq: Once | ORAL | Status: AC
  Administered 2019-02-19: 324 mg via ORAL
  Filled 2019-02-19: qty 4

## 2019-02-19 NOTE — ED Notes (Signed)
Pt not found in waiting area or outside the ED entrance.

## 2019-02-19 NOTE — ED Notes (Signed)
Pt not in room or in nearby BR.  Clothes and belongings gone from the room, monitor leads, patches and BP cuff on bed.  Attempted to locate within the department without success.  Provider notified, IV team notified.

## 2019-02-19 NOTE — ED Provider Notes (Signed)
[facility] EMERGENCY DEPARTMENT Provider Note   CSN: [id number] Arrival date & time: 02/19/19  4098     History   Chief Complaint No chief complaint on file.   HPI Kelsey Santana is a 58 y.o. female with PMHx HTN, HLD, GERD, who presents to the ED today complaining of sudden onset, constant, substernal and left sided chest pain that began this AM at 0700 (approximatly 3 hours ago). Pt states the pain radiates up into her left jaw and down her left arm. She also complains of dyspnea on exertion and nausea. She was seen in the ED on 06/28 for same and admitted for cardiac work up which was essentially negative. Pt was discharged home and told to call cardiology in a week to schedule a follow up appointment. She reports she began having the chest pain again today so she came to the ED. Pt is followed by the [facility] and has been unable to see them since the pandemic. Denies fever, chills, cough, vomiting, abdominal pain, or any other associated symptoms. No recent prolonged travel. Pt was in the hospital for 1 day without surgery. No hx DVT/PE. No estrogen therapy. No hemoptysis. Pt reports last stress test was done about 1-2 years ago at the [place] [facility].   Pt also complaining of a left sided headache that she typically gets with her chest pain. She reports hx of migraine headaches for which she is supposed to take daily medication but she is noncompliant. Denies vision changes, speech difficulty, confusion, unilateral weakness or numbness, rash, fever.        Past Medical History:  Diagnosis Date  . 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 GERD (gastroesophageal reflux disease)   . Hypercholesteremia   . Hypertension   . Migraine   . Pre-diabetes     Patient Active Problem List   Diagnosis Date Noted  . Chest pain, musculoskeletal   . 'light-for-dates' infant with signs of fetal malnutrition 06/07/2017  . Hypocalcemia 06/07/2017  . Vitamin D deficiency 06/07/2017  . Chest pain  11/15/2015  . Depression 04/15/2012  . Morbid obesity due to excess calories ([place]) 04/15/2012  . Tinea corporis 04/15/2012  . Fatigue 04/15/2012  . Epigastric pain 03/19/2012  . Migraine headache 03/11/2012  . GERD (gastroesophageal reflux disease) 03/11/2012  . HTN (hypertension) 03/11/2012  . HLD (hyperlipidemia) 03/11/2012    Past Surgical History:  Procedure Laterality Date  . ABDOMINAL HYSTERECTOMY     complete  . COLONOSCOPY N/A 01/16/2017   Procedure: COLONOSCOPY;  Surgeon: Carol Ada, MD;  Location: [facility];  Service: Endoscopy;  Laterality: N/A;  . ESOPHAGOGASTRODUODENOSCOPY N/A 01/16/2017   Procedure: ESOPHAGOGASTRODUODENOSCOPY (EGD);  Surgeon: Carol Ada, MD;  Location: [facility];  Service: Endoscopy;  Laterality: N/A;     OB History   No obstetric history on file.      Home Medications    Prior to Admission medications   Medication Sig Start Date End Date Taking? Authorizing Provider  amLODipine (NORVASC) 5 MG tablet Take 1 tablet (5 mg total) by mouth at bedtime. 02/17/19   Dhungel, Flonnie Overman, MD  atorvastatin (LIPITOR) 20 MG tablet Take 1 tablet (20 mg total) by mouth at bedtime. 02/17/19   Dhungel, Flonnie Overman, MD  cyclobenzaprine (FLEXERIL) 10 MG tablet Take 1 tablet (10 mg total) by mouth 2 (two) times daily as needed for muscle spasms. 10/19/17   Khatri, Hina, PA-C  dihydroergotamine (MIGRANAL) 4 MG/ML nasal spray Place 1 spray into the  nose as needed for migraine. Use in one nostril as directed.  No more than 4 sprays in one hour 07/24/18   Narda Amber K, DO  DULoxetine (CYMBALTA) 60 MG capsule Take 60 mg by mouth daily.    [provider]  gabapentin (NEURONTIN) 300 MG capsule Take 300 mg by mouth 2 (two) times daily.    [provider]  ibuprofen (ADVIL) 200 MG tablet Take 2 tablets (400 mg total) by mouth every 8 (eight) hours as needed for headache. 02/17/19   Dhungel, Nishant, MD  lidocaine (XYLOCAINE) 5 % ointment APPLY TO THIGH  DAILY AS NEEDED FOR PAIN 02/17/19   Dhungel, Flonnie Overman, MD  naproxen (NAPROSYN) 500 MG tablet Take 1 tablet (500 mg total) by mouth 2 (two) times daily. 10/19/17   Khatri, Hina, PA-C  nortriptyline (PAMELOR) 10 MG capsule Start 10mg  at bedtime for 2 weeks, then increase to 2 tablet at bedtime Patient taking differently: Take 20 mg by mouth at bedtime.  06/28/18   Patel, Arvin Collard K, DO  SUMAtriptan (IMITREX) 50 MG tablet Take 1 tablet (50 mg total) by mouth every 2 (two) hours as needed for migraine. May repeat in 2 hours if headache persists or recurs. 05/14/17   Patel, Arvin Collard K, DO  traMADol (ULTRAM) 50 MG tablet Take 1 tablet (50 mg total) by mouth every 6 (six) hours as needed. 04/11/17   Veryl Speak, MD  Vitamin D, Ergocalciferol, (DRISDOL) 1.25 MG (50000 UT) CAPS capsule Take 50,000 Units by mouth 3 (three) times a week.    [provider]    Family History Family History  Problem Relation Age of Onset  . Alcohol abuse Mother   . Diabetes Mother   . Hyperlipidemia Mother   . Hypertension Mother   . Diabetes Brother   . Hyperlipidemia Brother   . Hypertension Brother   . Thyroid disease Neg Hx     Social History Social History   Tobacco Use  . Smoking status: Former Research scientist (life sciences)  . Smokeless tobacco: Former Systems developer  . Tobacco comment: quit 28 yrs ago  Substance Use Topics  . Alcohol use: No  . Drug use: No     Allergies   Ciprofloxacin, Hydrocortisone, and Ace inhibitors   Review of Systems Review of Systems  Constitutional: Negative for chills and fever.  HENT: Negative for congestion.   Eyes: Negative for visual disturbance.  Respiratory: Positive for shortness of breath. Negative for cough.   Cardiovascular: Positive for chest pain.  Gastrointestinal: Positive for nausea. Negative for abdominal pain, constipation, diarrhea and vomiting.  Genitourinary: Negative for difficulty urinating.  Musculoskeletal: Negative for myalgias.  Skin: Negative for rash.  Neurological:  Positive for headaches. Negative for dizziness, weakness, light-headedness and numbness.     Physical Exam Updated Vital Signs BP 128/80 (BP Location: Left Arm)   Pulse (!) 56   Temp 98.2 F (36.8 C) (Oral)   Resp 16   Ht 5\' 5"  (1.651 m)   Wt 132 kg   SpO2 100%   BMI 48.42 kg/m   Physical Exam Vitals signs and nursing note reviewed.  Constitutional:      Appearance: She is not ill-appearing.  HENT:     Head: Normocephalic and atraumatic.  Eyes:     Conjunctiva/sclera: Conjunctivae normal.  Neck:     Musculoskeletal: Neck supple.  Cardiovascular:     Rate and Rhythm: Normal rate and regular rhythm.     Pulses: Normal pulses.  Pulmonary:     Effort: Pulmonary  effort is normal.     Breath sounds: Normal breath sounds. No wheezing, rhonchi or rales.  Chest:     Chest wall: Tenderness present.  Abdominal:     Palpations: Abdomen is soft.     Tenderness: There is no abdominal tenderness. There is no guarding or rebound.  Musculoskeletal:     Comments: MAE x4 Strength and sensation grossly intact in all extremities Distal pulses intact   Skin:    General: Skin is warm and dry.  Neurological:     Mental Status: She is alert.     Comments: CN 3-12 grossly intact A&O x4 GCS 15 Sensation and strength intact Coordination with finger-to-nose WNL neg pronator drift       ED Treatments / Results  Labs (all labs ordered are listed, but only abnormal results are displayed) Labs Reviewed  BASIC METABOLIC PANEL  TROPONIN I (HIGH SENSITIVITY)  TROPONIN I (HIGH SENSITIVITY)  CBC  D-DIMER, QUANTITATIVE (NOT AT [facility])  CBG MONITORING, ED  I-STAT BETA HCG BLOOD, ED ([facility], [facility], [facility] ONLY)    EKG None  Radiology No results found.  Procedures Procedures (including critical care time)  Medications Ordered in ED Medications  aspirin chewable tablet 324 mg (has no administration in time range)     Initial Impression / Assessment and Plan / ED Course  I have reviewed  the triage vital signs and the nursing notes.  Pertinent labs & imaging results that were available during my care of the patient were reviewed by me and considered in my medical decision making (see chart for details).  58 year old female presenting to the ED with left sided chest pain, DOE, and nausea. Seen 3 days ago for same; admitted for 1 day for cardiac workup which was negative. Personally reviewed records; ECHO done with E 6-65%. Will workup for possible ACS and consult cardiology although given very recent admission with no findings and plan for cardiology follow up soon pt may be discharged home. Given she has been in the hospital recently and she is above the age of 78 cannot PERC out; will obtain D dimer as well. EKG without ischemic changes today.   Pt eloped at an unknown time; did not receive delta trop; was unable to discuss case with cardiology.    Clinical Course as of Feb 19 1555  Wed Feb 19, 2019  1210 Slightly higher today compared to 3 days ago at 0.60; CTA done on 06/28 which was negative; do not feel patient needs repeat CTA at this time  D-Dimer, Quant(!): 0.89 [MV]  1223 Initial trop negative; will consult cardiology considering patient was just discharged with cardiac workup  Troponin I (High Sensitivity): 5 [MV]  1226 Nursing staff informed that they cannot locate patient; believed to have left AMA   [MV]    Clinical Course User Index [MV] Eustaquio Maize, PA-C         Final Clinical Impressions(s) / ED Diagnoses   Final diagnoses:  None    ED Discharge Orders    None       Eustaquio Maize, PA-C 02/19/19 1559    Charlesetta Shanks, MD 02/20/19 [phone]

## 2019-02-19 NOTE — ED Notes (Signed)
IV consult order written, pt does report increasing CP.  MD notified.

## 2019-02-19 NOTE — Telephone Encounter (Signed)
LVM to set up hospital follow-up appt per [facility]. Pt was discharged on 06/29 and needs a follow up visit in 1-2 weeks

## 2019-02-19 NOTE — ED Triage Notes (Signed)
DC'd fropm IP on the 28th after [facility] for CP.   CP today started @ 0700, attempted to walk it off which made it worse.  Dates pain radiates down L side neck and into back.  Some noted nausea without emesis.  Does have reported dyspnea.

## 2019-02-19 NOTE — Progress Notes (Signed)
VAST consulted for IV access.  Upon arrival to pt's room in ER, noted gown and cardiac leads on bed with no patient in room. Contacted nurse who stated pt is in restroom. Waited approx 10 mins and contacted nurse to check again. Pt is no where to be found and her belongings are missing.

## 2019-02-25 ENCOUNTER — Ambulatory Visit: Payer: Non-veteran care | Admitting: Adult Health

## 2019-03-02 NOTE — Progress Notes (Deleted)
Cardiology Office Note   Date:  03/02/2019   ID:  Ulanda, Tackett 1961-06-02, MRN [id number]  PCP:  [facility]  Cardiologist: Dr. Debara Pickett No chief complaint on file.    History of Present Illness: Kelsey Santana is a 58 y.o. female who presents for ongoing assessment and management of hypertension, hyperlipidemia, who was recently admitted to the hospital in June 2020 and seen on consultation by cardiology for complaints of chest pain.  She had had previous contact with cardiology in 2011 when seen by Dr. Tina Griffiths of [facility] heart and vascular at the time.  Cardiac catheterization in 2011 revealed one-vessel CAD with a 20% stenosis of the mid and distal LAD.  She did follow-up with Dr. Debara Pickett in 2017 with complaints of chest pain with a follow-up stress test finding of normal.  On evaluation during hospitalization on 02/17/2019, the patient admitted that she had run out of her antihypertensive and cholesterol medication 1 month earlier.  Blood pressure was elevated in the 409W systolic over 119J diastolic.  She was ruled out for PE.  Pain was reproducible with palpation of the chest.  CT scan on evaluation for PE also noted groundglass airspace opacities involving the right lower lobe.  She was to have a follow-up CT in 3 months by their recommendations.  She was started back on her antihypertensive therapy to include amlodipine 5 mg daily, and will likely need to have additional medications as an outpatient on follow-up.  She was discharged on 02/17/2019.  Unfortunately, she returned to the ED on 02/19/2019, with recurrent substernal left-sided chest pain radiating up to her left jaw and down her left arm with dyspnea on exertion and nausea.  She again had not been taking her medications as directed.  The patient left AMA with no further testing or treatment completed.  Past Medical History:  Diagnosis Date  . 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 GERD (gastroesophageal reflux  disease)   . Hypercholesteremia   . Hypertension   . Migraine   . Pre-diabetes     Past Surgical History:  Procedure Laterality Date  . ABDOMINAL HYSTERECTOMY     complete  . COLONOSCOPY N/A 01/16/2017   Procedure: COLONOSCOPY;  Surgeon: Carol Ada, MD;  Location: [facility];  Service: Endoscopy;  Laterality: N/A;  . ESOPHAGOGASTRODUODENOSCOPY N/A 01/16/2017   Procedure: ESOPHAGOGASTRODUODENOSCOPY (EGD);  Surgeon: Carol Ada, MD;  Location: [facility];  Service: Endoscopy;  Laterality: N/A;     Current Outpatient Medications  Medication Sig Dispense Refill  . amLODipine (NORVASC) 5 MG tablet Take 1 tablet (5 mg total) by mouth at bedtime. 30 tablet 0  . atorvastatin (LIPITOR) 20 MG tablet Take 1 tablet (20 mg total) by mouth at bedtime. 30 tablet 0  . cyclobenzaprine (FLEXERIL) 10 MG tablet Take 1 tablet (10 mg total) by mouth 2 (two) times daily as needed for muscle spasms. 20 tablet 0  . dihydroergotamine (MIGRANAL) 4 MG/ML nasal spray Place 1 spray into the nose as needed for migraine. Use in one nostril as directed.  No more than 4 sprays in one hour 2 mL 0  . DULoxetine (CYMBALTA) 60 MG capsule Take 60 mg by mouth daily.    Marland Kitchen gabapentin (NEURONTIN) 300 MG capsule Take 300 mg by mouth 2 (two) times daily.    Marland Kitchen ibuprofen (ADVIL) 200 MG tablet Take 2 tablets (400 mg total) by mouth every 8 (eight) hours as needed for headache. 30 tablet 0  .  lidocaine (XYLOCAINE) 5 % ointment APPLY TO THIGH DAILY AS NEEDED FOR PAIN 30 g 3  . naproxen (NAPROSYN) 500 MG tablet Take 1 tablet (500 mg total) by mouth 2 (two) times daily. 30 tablet 0  . nortriptyline (PAMELOR) 10 MG capsule Start 10mg  at bedtime for 2 weeks, then increase to 2 tablet at bedtime (Patient taking differently: Take 20 mg by mouth at bedtime. ) 60 capsule 3  . SUMAtriptan (IMITREX) 50 MG tablet Take 1 tablet (50 mg total) by mouth every 2 (two) hours as needed for migraine. May repeat in 2 hours if headache persists or  recurs. 10 tablet 3  . traMADol (ULTRAM) 50 MG tablet Take 1 tablet (50 mg total) by mouth every 6 (six) hours as needed. 10 tablet 0  . Vitamin D, Ergocalciferol, (DRISDOL) 1.25 MG (50000 UT) CAPS capsule Take 50,000 Units by mouth 3 (three) times a week.     No current facility-administered medications for this visit.     Allergies:   Ciprofloxacin, Hydrocortisone, and Ace inhibitors    Social History:  The patient  reports that she has quit smoking. She has quit using smokeless tobacco. She reports that she does not drink alcohol or use drugs.   Family History:  The patient's family history includes Alcohol abuse in her mother; Diabetes in her brother and mother; Hyperlipidemia in her brother and mother; Hypertension in her brother and mother.    ROS: All other systems are reviewed and negative. Unless otherwise mentioned in H&P    PHYSICAL EXAM: VS:  There were no vitals taken for this visit. , BMI There is no height or weight on file to calculate BMI. GEN: Well nourished, well developed, in no acute distress HEENT: normal Neck: no JVD, carotid bruits, or masses Cardiac: ***RRR; no murmurs, rubs, or gallops,no edema  Respiratory:  Clear to auscultation bilaterally, normal work of breathing GI: soft, nontender, nondistended, + BS MS: no deformity or atrophy Skin: warm and dry, no rash Neuro:  Strength and sensation are intact Psych: euthymic mood, full affect   EKG:  EKG {ACTION; IS/IS EVO:[id number]} ordered today. The ekg ordered today demonstrates ***   Recent Labs: 02-24-2019: ALT 17 02/19/2019: BUN 12; Creatinine, Ser 0.81; Hemoglobin 13.6; Platelets 202; Potassium 3.8; Sodium 139    Lipid Panel    Component Value Date/Time   CHOL 301 (H) 04/16/2012 0916   TRIG 156 (H) 04/16/2012 0916   HDL 48 04/16/2012 0916   CHOLHDL 6.3 04/16/2012 0916   VLDL 31 04/16/2012 0916   LDLCALC 222 (H) 04/16/2012 0916      Wt Readings from Last 3 Encounters:  02/19/19 291 lb (132  kg)  24-Feb-2019 291 lb 9.6 oz (132.3 kg)  06/28/18 (!) 300 lb 2 oz (136.1 kg)      Other studies Reviewed: Echocardiogram 02-24-2019 1. The left ventricle has normal systolic function with an ejection fraction of 60-65%. The cavity size was normal. Left ventricular diastolic Doppler parameters are consistent with impaired relaxation.  2. The right ventricle has normal systolic function. The cavity was normal. There is no increase in right ventricular wall thickness.  3. The aortic valve is tricuspid. Mild thickening of the aortic valve. Mild calcification of the aortic valve.   ASSESSMENT AND PLAN:  1.  ***   Current medicines are reviewed at length with the patient today.    Labs/ tests ordered today include: *** Phill Myron. [place] Pugh, ANP, AACC   03/02/2019 4:42 PM  [address] [place] [address] Office [phone] Fax [phone]

## 2019-03-03 ENCOUNTER — Ambulatory Visit: Payer: No Typology Code available for payment source | Admitting: Adult Health

## 2019-03-04 ENCOUNTER — Encounter: Payer: Self-pay | Admitting: Adult Health

## 2019-03-04 ENCOUNTER — Ambulatory Visit (INDEPENDENT_AMBULATORY_CARE_PROVIDER_SITE_OTHER): Payer: No Typology Code available for payment source | Admitting: Adult Health

## 2019-03-04 ENCOUNTER — Telehealth: Payer: Self-pay | Admitting: Neurology

## 2019-03-04 ENCOUNTER — Other Ambulatory Visit: Payer: Self-pay

## 2019-03-04 VITALS — BP 125/73 | HR 90 | Temp 97.7°F | Ht 65.0 in | Wt 291.6 lb

## 2019-03-04 DIAGNOSIS — E7800 Pure hypercholesterolemia, unspecified: Secondary | ICD-10-CM

## 2019-03-04 DIAGNOSIS — I10 Essential (primary) hypertension: Secondary | ICD-10-CM

## 2019-03-04 DIAGNOSIS — R0789 Other chest pain: Secondary | ICD-10-CM

## 2019-03-04 NOTE — Telephone Encounter (Signed)
Patient left msg with after hours about being prescribed nasal spray costing 3,000$ and not covered by her insurance. She needs some hydrocodone to tie her over until she can get other med. She has a terrible migraine. Thanks!

## 2019-03-04 NOTE — Telephone Encounter (Signed)
Advised patient we could not prescribe hydrocodone.  Offered to make an appointment she declined and said she would like to be referred to pain management.  Advised patient to have her primary caregiver refer her.

## 2019-03-04 NOTE — Patient Instructions (Signed)
Medication Instructions:  Continue current medications  If you need a refill on your cardiac medications before your next appointment, please call your pharmacy.  Labwork: None Ordered   Testing/Procedures: None ordered   Follow-Up: You will need a follow up appointment in 6 months.  Please call our office 2 months in advance to schedule this appointment.  You may see Dr Debara Pickett or one of the following Advanced Practice Providers on your designated Care Team: Almyra Deforest, [place] . Fabian Sharp, PA-C     At [facility], you and your health needs are our priority.  As part of our continuing mission to provide you with exceptional heart care, we have created designated Provider Care Teams.  These Care Teams include your primary Cardiologist (physician) and Advanced Practice Providers (APPs -  Physician Assistants and Nurse Practitioners) who all work together to provide you with the care you need, when you need it.  Thank you for choosing [facility] at [facility]!!

## 2019-03-04 NOTE — Telephone Encounter (Signed)
Patient states hydrocodone helps her migraines.  Muscle relaxer's and nerve pills do not help her.  Explained to patient that we can try to contact [facility] to get a better price on migranal but she just wants hydrocodone.  Patient says that she only has [employer] so she can only get her prescriptions that way.  Do you have any further suggestions?

## 2019-03-04 NOTE — Progress Notes (Signed)
Cardiology Office Note   Date:  03/04/2019   ID:  Kamea, Dacosta 08/19/1961, MRN [id number]  PCP:  [facility]  Cardiologist:  Dr. Debara Pickett CC: Post hospital follow-up-chest pain   History of Present Illness: OLIVIANNA HIGLEY is a 58 y.o. female who presents for postoperative follow-up after being seen on consultation by Dr. Margaretann Loveless for complaints of chest pain.  She has a history of hypertension hyperlipidemia and obesity.  She was admitted on 02/17/2019 and was found to be hypertensive and had not been taking her medication for over a month.  Blood pressures were elevated.  She was treated in the emergency room and blood pressure was better controlled with resolution of chest discomfort.  She was ruled out for PE however the CT scan revealed groundglass opacities with mild coronary calcification.  It was noted that the symptoms were atypical reproducible with palpation of the chest.  Once blood pressure was better controlled she began to feel better.  She was ruled out for ACS.  She was to be considered for a outpatient sleep study due to body habitus and hypertension.  She had also stopped taking her statin medicine.  This was reinstated.  Unfortunately she returned to the emergency room on 02/19/2019 with recurrent chest pain.  She had not taken any of her medications yet.  The patient did not stay long enough for full work-up as she left AMA.  She is here on follow-up today with continued reproducible chest pain occurring with palpation of the sternum and right upper quadrant.  She describes it as sharp and heaviness across her chest.  She has not followed up with her primary care physician through the [facility] in [place].  She was requesting more pain medication.  Past Medical History:  Diagnosis Date   Back pain    l 4 and l5 djd   GERD (gastroesophageal reflux disease)    Hypercholesteremia    Hypertension    Migraine    Pre-diabetes     Past Surgical  History:  Procedure Laterality Date   ABDOMINAL HYSTERECTOMY     complete   COLONOSCOPY N/A 01/16/2017   Procedure: COLONOSCOPY;  Surgeon: Carol Ada, MD;  Location: [facility];  Service: Endoscopy;  Laterality: N/A;   ESOPHAGOGASTRODUODENOSCOPY N/A 01/16/2017   Procedure: ESOPHAGOGASTRODUODENOSCOPY (EGD);  Surgeon: Carol Ada, MD;  Location: [facility];  Service: Endoscopy;  Laterality: N/A;     Current Outpatient Medications  Medication Sig Dispense Refill   amLODipine (NORVASC) 5 MG tablet Take 1 tablet (5 mg total) by mouth at bedtime. 30 tablet 0   atorvastatin (LIPITOR) 20 MG tablet Take 1 tablet (20 mg total) by mouth at bedtime. 30 tablet 0   cyclobenzaprine (FLEXERIL) 10 MG tablet Take 1 tablet (10 mg total) by mouth 2 (two) times daily as needed for muscle spasms. 20 tablet 0   dihydroergotamine (MIGRANAL) 4 MG/ML nasal spray Place 1 spray into the nose as needed for migraine. Use in one nostril as directed.  No more than 4 sprays in one hour 2 mL 0   DULoxetine (CYMBALTA) 60 MG capsule Take 60 mg by mouth daily.     gabapentin (NEURONTIN) 300 MG capsule Take 300 mg by mouth 2 (two) times daily.     ibuprofen (ADVIL) 200 MG tablet Take 2 tablets (400 mg total) by mouth every 8 (eight) hours as needed for headache. 30 tablet 0   lidocaine (XYLOCAINE) 5 % ointment APPLY TO THIGH DAILY AS NEEDED  FOR PAIN 30 g 3   naproxen (NAPROSYN) 500 MG tablet Take 1 tablet (500 mg total) by mouth 2 (two) times daily. 30 tablet 0   nortriptyline (PAMELOR) 10 MG capsule Start 10mg  at bedtime for 2 weeks, then increase to 2 tablet at bedtime (Patient taking differently: Take 20 mg by mouth at bedtime. ) 60 capsule 3   SUMAtriptan (IMITREX) 50 MG tablet Take 1 tablet (50 mg total) by mouth every 2 (two) hours as needed for migraine. May repeat in 2 hours if headache persists or recurs. 10 tablet 3   traMADol (ULTRAM) 50 MG tablet Take 1 tablet (50 mg total) by mouth every 6 (six)  hours as needed. 10 tablet 0   Vitamin D, Ergocalciferol, (DRISDOL) 1.25 MG (50000 UT) CAPS capsule Take 50,000 Units by mouth 3 (three) times a week.     No current facility-administered medications for this visit.     Allergies:   Ciprofloxacin, Hydrocortisone, and Ace inhibitors    Social History:  The patient  reports that she has quit smoking. She has quit using smokeless tobacco. She reports that she does not drink alcohol or use drugs.   Family History:  The patient's family history includes Alcohol abuse in her mother; Diabetes in her brother and mother; Hyperlipidemia in her brother and mother; Hypertension in her brother and mother.    ROS: All other systems are reviewed and negative. Unless otherwise mentioned in H&P    PHYSICAL EXAM: VS:  There were no vitals taken for this visit. , BMI There is no height or weight on file to calculate BMI. GEN: Well nourished, well developed, in no acute distress HEENT: normal Neck: no JVD, carotid bruits, or masses Cardiac: RRR; no murmurs, rubs, or gallops,no edema  Respiratory:  Clear to auscultation bilaterally, normal work of breathing GI: soft,, nondistended, + BS, mild tenderness with palpation of the right upper quadrant. MS: no deformity or atrophy Skin: warm and dry, no rash Neuro:  Strength and sensation are intact Psych: euthymic mood, full affect   EKG: Not completed during office visit.  Recent Labs: 02/17/2019: ALT 17 02/19/2019: BUN 12; Creatinine, Ser 0.81; Hemoglobin 13.6; Platelets 202; Potassium 3.8; Sodium 139    Lipid Panel    Component Value Date/Time   CHOL 301 (H) 04/16/2012 0916   TRIG 156 (H) 04/16/2012 0916   HDL 48 04/16/2012 0916   CHOLHDL 6.3 04/16/2012 0916   VLDL 31 04/16/2012 0916   LDLCALC 222 (H) 04/16/2012 0916      Wt Readings from Last 3 Encounters:  02/19/19 291 lb (132 kg)  02/17/19 291 lb 9.6 oz (132.3 kg)  06/28/18 (!) 300 lb 2 oz (136.1 kg)      Other studies  Reviewed: Echocardiogram 02/17/2019 1. The left ventricle has normal systolic function with an ejection fraction of 60-65%. The cavity size was normal. Left ventricular diastolic Doppler parameters are consistent with impaired relaxation.  2. The right ventricle has normal systolic function. The cavity was normal. There is no increase in right ventricular wall thickness.  3. The aortic valve is tricuspid. Mild thickening of the aortic valve. Mild calcification of the aortic valve.  NM Stress Test 12/10/2015 Study Highlights   Nuclear stress EF: 64%.  The left ventricular ejection fraction is normal (55-65%).  There was no ST segment deviation noted during stress.  The study is normal.   Normal stress nuclear study with no ischemia or infarction; EF 64.    ASSESSMENT AND PLAN:  1.  Atypical chest pain: Likely musculoskeletal versus GI in etiology.  I will not be prescribing any pain management medications.  She is advised to talk to her primary care physician about a GI work-up to rule out gallbladder disease and possible referral to pain management clinic.  Blood pressure and heart rate are normal even though she complains of having extreme pain during clinic visit.  Question drug-seeking behavior.  No further cardiac testing is planned at this time as she has had normal stress test and normal echocardiogram.  2.  Hypertension: She is now compliant with her medications with good blood pressure and heart rate control.  I have advised her to continue to take her medications as directed.  3.  Hypercholesterolemia: She will continue on statin therapy.  Follow-up labs through the [place].   Current medicines are reviewed at length with the patient today.    Labs/ tests ordered today include: none  Phill Myron. West Pugh, ANP, AACC   03/04/2019 9:09 AM    [facility] [place] [address] Office [phone] Fax [phone]

## 2019-03-04 NOTE — Telephone Encounter (Signed)
Patient was last seen in November 2019 and needs to schedule a follow-up visit to discuss medication management.  As you already mentioned, we do not prescribe hydrocodone.

## 2019-03-27 ENCOUNTER — Telehealth: Payer: Self-pay | Admitting: Adult Health

## 2019-03-27 NOTE — Telephone Encounter (Signed)
New Message     Rolan Bucco is calling from the [facility] and would like the office notes from the last appt to be faxed to their office Please fax too [phone]

## 2019-03-27 NOTE — Telephone Encounter (Signed)
Last OV e-faxed to [facility].

## 2019-04-03 ENCOUNTER — Telehealth: Payer: Self-pay | Admitting: Neurology

## 2019-04-03 NOTE — Telephone Encounter (Signed)
Patient left vm and is wanting a referral to pain mang. Because she said she is still suffering.Thanks!

## 2019-04-04 NOTE — Telephone Encounter (Signed)
Called patient she was made aware of provider response below

## 2019-04-04 NOTE — Telephone Encounter (Signed)
Please tell patient to contact her PCP for referral to pain management, so that they stay informed of her care and her insurance will cover those services.  She has [employer] insurance and they tend to need referrals from the [facility] to be covered.

## 2019-04-04 NOTE — Telephone Encounter (Signed)
[facility] for referral? If so location?

## 2019-04-15 ENCOUNTER — Other Ambulatory Visit: Payer: Self-pay

## 2019-04-17 ENCOUNTER — Ambulatory Visit: Payer: No Typology Code available for payment source | Admitting: Endocrinology

## 2019-05-04 ENCOUNTER — Other Ambulatory Visit: Payer: Self-pay

## 2019-05-04 ENCOUNTER — Encounter (HOSPITAL_COMMUNITY): Payer: Self-pay

## 2019-05-04 ENCOUNTER — Ambulatory Visit (HOSPITAL_COMMUNITY)
Admission: EM | Admit: 2019-05-04 | Discharge: 2019-05-04 | Disposition: A | Discharge status: Home / Self Care | Payer: No Typology Code available for payment source | Attending: Family Medicine | Admitting: Family Medicine

## 2019-05-04 DIAGNOSIS — M1712 Unilateral primary osteoarthritis, left knee: Secondary | ICD-10-CM

## 2019-05-04 MED ORDER — DEXAMETHASONE SODIUM PHOSPHATE 10 MG/ML IJ SOLN
INTRAMUSCULAR | Status: AC
  Filled 2019-05-04: qty 1

## 2019-05-04 MED ORDER — HYDROCODONE-ACETAMINOPHEN 5-325 MG PO TABS: 2.0000 | ORAL_TABLET | ORAL | 0 refills | Status: DC | PRN

## 2019-05-04 MED ORDER — BUPIVACAINE HCL (PF) 0.5 % IJ SOLN
INTRAMUSCULAR | Status: AC
  Filled 2019-05-04: qty 10

## 2019-05-04 MED ORDER — METHYLPREDNISOLONE ACETATE 40 MG/ML IJ SUSP
INTRAMUSCULAR | Status: AC
  Filled 2019-05-04: qty 1

## 2019-05-04 NOTE — ED Triage Notes (Signed)
Patient presents to Urgent Care with complaints of left knee pain radiating up to her hip since last week. Patient reports she has L4,5,6 disc degeneration and she thinks that is effecting how she walks.

## 2019-05-04 NOTE — ED Provider Notes (Signed)
[facility]    CSN: [id number] Arrival date & time: 05/04/19  1618      History   Chief Complaint Chief Complaint  Patient presents with  . Knee Pain    HPI Kelsey Santana is a 58 y.o. female.   Patient complains of left knee pain such that affects her gait and now also complains of hip pain on that side.  I suspect the favoring of the knee has caused some inflammation in her hip.  Pain is complicated by the fact that she has lumbar disc disease and that may also be a contributing factor to her pain but she thinks her main problem is pain in the knee.  HPI  Past Medical History:  Diagnosis Date  . 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 GERD (gastroesophageal reflux disease)   . Hypercholesteremia   . Hypertension   . Migraine   . Pre-diabetes     Patient Active Problem List   Diagnosis Date Noted  . Chest pain, musculoskeletal   . 'light-for-dates' infant with signs of fetal malnutrition 06/07/2017  . Hypocalcemia 06/07/2017  . Vitamin D deficiency 06/07/2017  . Chest pain 11/15/2015  . Depression 04/15/2012  . Morbid obesity due to excess calories ([place]) 04/15/2012  . Tinea corporis 04/15/2012  . Fatigue 04/15/2012  . Epigastric pain 03/19/2012  . Migraine headache 03/11/2012  . GERD (gastroesophageal reflux disease) 03/11/2012  . HTN (hypertension) 03/11/2012  . HLD (hyperlipidemia) 03/11/2012    Past Surgical History:  Procedure Laterality Date  . ABDOMINAL HYSTERECTOMY     complete  . COLONOSCOPY N/A 01/16/2017   Procedure: COLONOSCOPY;  Surgeon: Carol Ada, MD;  [facility];  Service: Endoscopy;  Laterality: N/A;  . ESOPHAGOGASTRODUODENOSCOPY N/A 01/16/2017   Procedure: ESOPHAGOGASTRODUODENOSCOPY (EGD);  Surgeon: Carol Ada, MD;  Location: [facility];  Service: Endoscopy;  Laterality: N/A;    OB History   No obstetric history on file.      Home Medications    Prior to Admission medications   Medication Sig Start Date End  Date Taking? Authorizing Provider  amLODipine (NORVASC) 5 MG tablet Take 1 tablet (5 mg total) by mouth at bedtime. 02/17/19   Dhungel, Flonnie Overman, MD  atorvastatin (LIPITOR) 20 MG tablet Take 1 tablet (20 mg total) by mouth at bedtime. 02/17/19   Dhungel, Flonnie Overman, MD  cyclobenzaprine (FLEXERIL) 10 MG tablet Take 1 tablet (10 mg total) by mouth 2 (two) times daily as needed for muscle spasms. Patient not taking: Reported on 03/04/2019 10/19/17   Delia Heady, PA-C  dihydroergotamine (MIGRANAL) 4 MG/ML nasal spray Place 1 spray into the nose as needed for migraine. Use in one nostril as directed.  No more than 4 sprays in one hour 07/24/18   Narda Amber K, DO  DULoxetine (CYMBALTA) 60 MG capsule Take 60 mg by mouth daily.    [provider]  gabapentin (NEURONTIN) 300 MG capsule Take 300 mg by mouth 2 (two) times daily.    [provider]  ibuprofen (ADVIL) 200 MG tablet Take 2 tablets (400 mg total) by mouth every 8 (eight) hours as needed for headache. Patient not taking: Reported on 03/04/2019 02/17/19   Dhungel, Nishant, MD  lidocaine (XYLOCAINE) 5 % ointment APPLY TO THIGH DAILY AS NEEDED FOR PAIN Patient not taking: Reported on 03/04/2019 02/17/19   Dhungel, Flonnie Overman, MD  naproxen (NAPROSYN) 500 MG tablet Take 1 tablet (500 mg total) by mouth 2 (two) times daily. Patient not  taking: Reported on 03/04/2019 10/19/17   Delia Heady, PA-C  nortriptyline (PAMELOR) 10 MG capsule Start 10mg  at bedtime for 2 weeks, then increase to 2 tablet at bedtime Patient taking differently: Take 20 mg by mouth at bedtime.  06/28/18   Patel, Arvin Collard K, DO  pantoprazole (PROTONIX) 20 MG tablet Take 20 mg by mouth daily.    [provider]  SUMAtriptan (IMITREX) 50 MG tablet Take 1 tablet (50 mg total) by mouth every 2 (two) hours as needed for migraine. May repeat in 2 hours if headache persists or recurs. Patient not taking: Reported on 03/04/2019 05/14/17   Narda Amber K, DO  traMADol (ULTRAM) 50 MG  tablet Take 1 tablet (50 mg total) by mouth every 6 (six) hours as needed. Patient not taking: Reported on 03/04/2019 04/11/17   Veryl Speak, MD  Vitamin D, Ergocalciferol, (DRISDOL) 1.25 MG (50000 UT) CAPS capsule Take 50,000 Units by mouth 3 (three) times a week.    [provider]    Family History Family History  Problem Relation Age of Onset  . Alcohol abuse Mother   . Diabetes Mother   . Hyperlipidemia Mother   . Hypertension Mother   . Diabetes Brother   . Hyperlipidemia Brother   . Hypertension Brother   . Thyroid disease Neg Hx     Social History Social History   Tobacco Use  . Smoking status: Former Research scientist (life sciences)  . Smokeless tobacco: Former Systems developer  . Tobacco comment: quit 28 yrs ago  Substance Use Topics  . Alcohol use: No  . Drug use: No     Allergies   Ciprofloxacin, Hydrocortisone, and Ace inhibitors   Review of Systems Review of Systems  Musculoskeletal: Positive for arthralgias and back pain.  All other systems reviewed and are negative.    Physical Exam Triage Vital Signs ED Triage Vitals  Enc Vitals Group     BP 05/04/19 1714 (!) 168/74     Pulse Rate 05/04/19 1714 70     Resp 05/04/19 1714 16     Temp 05/04/19 1714 98.5 F (36.9 C)     Temp Source 05/04/19 1714 Temporal     SpO2 05/04/19 1714 100 %     Weight --      Height --      Head Circumference --      Peak Flow --      Pain Score 05/04/19 1712 7     Pain Loc --      Pain Edu? --      Excl. in [place]? --    No data found.  Updated Vital Signs BP (!) 168/74 (BP Location: Right Arm)   Pulse 70   Temp 98.5 F (36.9 C) (Temporal)   Resp 16   SpO2 100%   Visual Acuity Right Eye Distance:   Left Eye Distance:   Bilateral Distance:    Right Eye Near:   Left Eye Near:    Bilateral Near:     Physical Exam Vitals signs and nursing note reviewed.  Constitutional:      Appearance: Normal appearance.  Musculoskeletal:     Comments: Left knee: There is no gross effusion.   There is crepitance with extension and flexion.  Left hip patient is obese and landmarks are hard to appreciate there seems to be some tenderness over the greater trochanter. We mutually agreed to try an injection of her knee along with a short course of analgesic.  Neurological:  Mental Status: She is alert.      UC Treatments / Results  Labs (all labs ordered are listed, but only abnormal results are displayed) Labs Reviewed - No data to display  EKG   Radiology No results found.  Procedures Join Aspiration/Injection  Date/Time: 05/04/2019 5:39 PM Performed by: Wardell Honour, MD Authorized by: Wardell Honour, MD   Consent:    Consent obtained:  Verbal   Consent given by:  Patient   Risks discussed:  Infection, bleeding and pain Location:    Location:  Knee Anesthesia (see MAR for exact dosages):    Anesthesia method:  Topical application Procedure details:    Needle gauge:  20 G   Approach:  Lateral   Steroid injected: yes     Specimen collected: no   Post-procedure details:    Dressing:  Adhesive bandage   Patient tolerance of procedure:  Tolerated well, no immediate complications   (including critical care time)  Medications Ordered in UC Medications - No data to display  Initial Impression / Assessment and Plan / UC Course  I have reviewed the triage vital signs and the nursing notes.  Pertinent labs & imaging results that were available during my care of the patient were reviewed by me and considered in my medical decision making (see chart for details).     Degenerative joint disease left knee injected with Depo-Medrol Marcaine without difficulty Final Clinical Impressions(s) / UC Diagnoses   Final diagnoses:  None   Discharge Instructions   None    ED Prescriptions    None     Controlled Substance Prescriptions [place] Controlled Substance Registry consulted? Yes, I have consulted the [place] Controlled Substances Registry for this patient,  and feel the risk/benefit ratio today is favorable for proceeding with this prescription for a controlled substance.   Wardell Honour, MD 05/04/19 1750

## 2019-05-27 ENCOUNTER — Ambulatory Visit (HOSPITAL_COMMUNITY)
Admission: EM | Admit: 2019-05-27 | Discharge: 2019-05-27 | Disposition: A | Discharge status: Home / Self Care | Payer: No Typology Code available for payment source | Attending: Family Medicine | Admitting: Family Medicine

## 2019-05-27 ENCOUNTER — Encounter (HOSPITAL_COMMUNITY): Payer: Self-pay

## 2019-05-27 ENCOUNTER — Telehealth (HOSPITAL_COMMUNITY): Payer: Self-pay | Admitting: Emergency Medicine

## 2019-05-27 DIAGNOSIS — M25561 Pain in right knee: Secondary | ICD-10-CM | POA: Diagnosis not present

## 2019-05-27 DIAGNOSIS — M25562 Pain in left knee: Secondary | ICD-10-CM | POA: Diagnosis not present

## 2019-05-27 MED ORDER — PREDNISONE 10 MG (21) PO TBPK: ORAL_TABLET | Freq: Every day | ORAL | 0 refills | Status: DC

## 2019-05-27 MED ORDER — HYDROCODONE-ACETAMINOPHEN 5-325 MG PO TABS: 1.0000 | ORAL_TABLET | Freq: Four times a day (QID) | ORAL | 0 refills | Status: DC | PRN

## 2019-05-27 NOTE — ED Triage Notes (Signed)
Pt report having knees pain and hip pain x 3 days. Pt report the last time she was here she had Hydrocodone and is the only medication that helps her with the pain.

## 2019-05-27 NOTE — Discharge Instructions (Addendum)
Be aware, pain medications may cause drowsiness. Please do not drive, operate heavy machinery or make important decisions while on this medication, it can cloud your judgement.  

## 2019-05-27 NOTE — ED Provider Notes (Signed)
[facility]   UA:[id number] 05/27/19 Arrival Time: 70  ASSESSMENT & PLAN:  1. Acute pain of both knees     No indication for imaging this evening. This appears to be an ongoing problem. She is trying to lose weight. Discussed.  I feel she would benefit from: Follow-up Information    Schedule an appointment as soon as possible for a visit  with [facility].   Contact information: [place] [address] [phone]          Meds ordered this encounter  Medications  . HYDROcodone-acetaminophen (NORCO/VICODIN) 5-325 MG tablet    Sig: Take 1 tablet by mouth every 6 (six) hours as needed for moderate pain or severe pain.    Dispense:  12 tablet    Refill:  0  . predniSONE (STERAPRED UNI-PAK 21 TAB) 10 MG (21) TBPK tablet    Sig: Take by mouth daily. Take as directed.    Dispense:  21 tablet    Refill:  0    [place] Controlled Substances Registry consulted for this patient. I feel the risk/benefit ratio today is favorable for proceeding with this prescription for a controlled substance. Medication sedation precautions given. Discussed that I will not be able to prescribe further narcotic medications for her current symptoms. She voices understanding.   Reviewed expectations re: course of current medical issues. Questions answered. Outlined signs and symptoms indicating need for more acute intervention. Patient verbalized understanding. After Visit Summary given.  SUBJECTIVE: History from: patient. Kelsey Santana is a 58 y.o. female who reports persistent marked pain of her bilateral knees; described as aching that without radiation. Onset: gradual. First noted: 1-2 months ago with off/on pain; now pain more persistent. Seen here 2-3 weeks ago. Reports pain medication helped but pain has worsened over the past 3 days. Injury/trama: no. Aggravating factors: certain movements, weight bearing and prolonged  walking/standing. Alleviating factors: rest. Associated symptoms: none reported. Extremity sensation changes or weakness: none. Self treatment: tried OTCs without relief of pain. No h/o knee surgery.  Past Surgical History:  Procedure Laterality Date  . ABDOMINAL HYSTERECTOMY     complete  . COLONOSCOPY N/A 01/16/2017   Procedure: COLONOSCOPY;  Surgeon: Carol Ada, MD;  [facility]: [facility];  Service: Endoscopy;  Laterality: N/A;  . ESOPHAGOGASTRODUODENOSCOPY N/A 01/16/2017   Procedure: ESOPHAGOGASTRODUODENOSCOPY (EGD);  Surgeon: Carol Ada, MD;  Location: [facility];  Service: Endoscopy;  Laterality: N/A;     ROS: As per HPI. All other systems negative.    OBJECTIVE:  Vitals:   05/27/19 1706  BP: 137/65  Pulse: 70  Resp: 16  Temp: 98.4 F (36.9 C)  TempSrc: Oral  SpO2: 96%    General appearance: alert; no distress HEENT: [place]; AT Neck: supple with FROM Resp: unlabored respirations Extremities: . Bilateral knees: warm and well perfused; poorly localized moderate tenderness over both knees reported; without gross deformities; swelling: none; bruising: none; ROM: normal with reported discomfort CV: brisk extremity capillary refill of bilateral LE; 2+ DP/PT pulses of bilateral LE. Skin: warm and dry; no visible rashes Neurologic: gait normal but moves slowly secondary to reported knee pain; normal reflexes of bilateral LE; normal sensation of bilateral LE; normal strength of bilateral LE Psychological: alert and cooperative; normal mood and affect    Allergies  Allergen Reactions  . Ciprofloxacin Itching    itching  . Hydrocortisone Nausea Only  . Ace Inhibitors Cough    Past Medical History:  Diagnosis Date  . 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 GERD (gastroesophageal reflux disease)   . Hypercholesteremia   . Hypertension   . Migraine   . Pre-diabetes    Social History   Socioeconomic History  . Marital status: Single    Spouse name: Not on file  .  Number of children: Not on file  . Years of education: Not on file  . Highest education level: Not on file  Occupational History  . Not on file  Social Needs  . Financial resource strain: Not on file  . Food insecurity    Worry: Not on file    Inability: Not on file  . Transportation needs    Medical: Not on file    Non-medical: Not on file  Tobacco Use  . Smoking status: Former Research scientist (life sciences)  . Smokeless tobacco: Former Systems developer  . Tobacco comment: quit 28 yrs ago  Substance and Sexual Activity  . Alcohol use: No  . Drug use: No  . Sexual activity: Never  Lifestyle  . Physical activity    Days per week: Not on file    Minutes per session: Not on file  . Stress: Not on file  Relationships  . Social Herbalist on phone: Not on file    Gets together: Not on file    Attends religious service: Not on file    Active member of club or organization: Not on file    Attends meetings of clubs or organizations: Not on file    Relationship status: Not on file  Other Topics Concern  . Not on file  Social History Narrative   epworth sleepiness scale = 10 (11/15/15)    Lives alone in a 2 story home.  Has 3 children.  Currently not working.  Has lost 6 jobs in the past 2 months due to her leg numbness and pain.     Education: 2 years of college.   Family History  Problem Relation Age of Onset  . Alcohol abuse Mother   . Diabetes Mother   . Hyperlipidemia Mother   . Hypertension Mother   . Diabetes Brother   . Hyperlipidemia Brother   . Hypertension Brother   . Thyroid disease Neg Hx    Past Surgical History:  Procedure Laterality Date  . ABDOMINAL HYSTERECTOMY     complete  . COLONOSCOPY N/A 01/16/2017   Procedure: COLONOSCOPY;  Surgeon: Carol Ada, MD;  [facility]: [facility];  Service: Endoscopy;  Laterality: N/A;  . ESOPHAGOGASTRODUODENOSCOPY N/A 01/16/2017   Procedure: ESOPHAGOGASTRODUODENOSCOPY (EGD);  Surgeon: Carol Ada, MD;  Location: [facility];  Service:  Endoscopy;  Laterality: N/AVanessa Kick, MD 05/28/19 [phone]

## 2019-06-03 ENCOUNTER — Encounter (HOSPITAL_COMMUNITY): Payer: Self-pay

## 2019-06-03 ENCOUNTER — Emergency Department (HOSPITAL_COMMUNITY)
Admission: EM | Admit: 2019-06-03 | Discharge: 2019-06-03 | Disposition: A | Discharge status: Home / Self Care | Payer: No Typology Code available for payment source | Attending: Emergency Medicine | Admitting: Emergency Medicine

## 2019-06-03 ENCOUNTER — Other Ambulatory Visit: Payer: Self-pay

## 2019-06-03 ENCOUNTER — Emergency Department (HOSPITAL_COMMUNITY): Payer: No Typology Code available for payment source

## 2019-06-03 DIAGNOSIS — E669 Obesity, unspecified: Secondary | ICD-10-CM | POA: Diagnosis not present

## 2019-06-03 DIAGNOSIS — R079 Chest pain, unspecified: Secondary | ICD-10-CM

## 2019-06-03 DIAGNOSIS — Z6841 Body Mass Index (BMI) 40.0 and over, adult: Secondary | ICD-10-CM | POA: Insufficient documentation

## 2019-06-03 DIAGNOSIS — M25551 Pain in right hip: Secondary | ICD-10-CM | POA: Diagnosis not present

## 2019-06-03 DIAGNOSIS — M25552 Pain in left hip: Secondary | ICD-10-CM | POA: Insufficient documentation

## 2019-06-03 DIAGNOSIS — Z87891 Personal history of nicotine dependence: Secondary | ICD-10-CM | POA: Diagnosis not present

## 2019-06-03 DIAGNOSIS — I10 Essential (primary) hypertension: Secondary | ICD-10-CM | POA: Diagnosis not present

## 2019-06-03 DIAGNOSIS — R0789 Other chest pain: Secondary | ICD-10-CM | POA: Insufficient documentation

## 2019-06-03 DIAGNOSIS — Z79899 Other long term (current) drug therapy: Secondary | ICD-10-CM | POA: Diagnosis not present

## 2019-06-03 DIAGNOSIS — M170 Bilateral primary osteoarthritis of knee: Secondary | ICD-10-CM | POA: Insufficient documentation

## 2019-06-03 LAB — I-STAT BETA HCG BLOOD, ED (MC, WL, AP ONLY): I-stat hCG, quantitative: <5 m[IU]/mL (ref <5)

## 2019-06-03 LAB — BASIC METABOLIC PANEL
Anion gap: 9 (ref 5–15)
BUN: 16 mg/dL (ref 6–20)
CO2: 29 mmol/L (ref 22–32)
Calcium: 8.8 mg/dL — ABNORMAL LOW (ref 8.9–10.3)
Chloride: 104 mmol/L (ref 98–111)
Creatinine, Ser: 0.88 mg/dL (ref 0.44–1.00)
GFR calc Af Amer: >60 mL/min (ref >60)
GFR calc non Af Amer: >60 mL/min (ref >60)
Glucose, Bld: 116 mg/dL — ABNORMAL HIGH (ref 70–99)
Potassium: 4.3 mmol/L (ref 3.5–5.1)
Sodium: 142 mmol/L (ref 135–145)

## 2019-06-03 LAB — CBC
HCT: 44 % (ref 36.0–46.0)
Hemoglobin: 14 g/dL (ref 12.0–15.0)
MCH: 29.3 pg (ref 26.0–34.0)
MCHC: 31.8 g/dL (ref 30.0–36.0)
MCV: 92.1 fL (ref 80.0–100.0)
Platelets: 246 10*3/uL (ref 150–400)
RBC: 4.78 MIL/uL (ref 3.87–5.11)
RDW: 14.4 % (ref 11.5–15.5)
WBC: 6.9 10*3/uL (ref 4.0–10.5)
nRBC: 0 % (ref 0.0–0.2)

## 2019-06-03 LAB — TROPONIN I (HIGH SENSITIVITY)
Troponin I (High Sensitivity): 3 ng/L (ref <18)
Troponin I (High Sensitivity): 3 ng/L (ref <18)

## 2019-06-03 MED ORDER — HYDROCODONE-ACETAMINOPHEN 5-325 MG PO TABS
1.0000 | ORAL_TABLET | ORAL | Status: AC
  Administered 2019-06-03: 18:00:00 1 via ORAL
  Filled 2019-06-03: qty 1

## 2019-06-03 MED ORDER — SODIUM CHLORIDE 0.9% FLUSH
3.0000 mL | Freq: Once | INTRAVENOUS | Status: AC
  Administered 2019-06-03: 19:00:00 3 mL via INTRAVENOUS

## 2019-06-03 MED ORDER — ETODOLAC 300 MG PO CAPS: 300.0000 mg | ORAL_CAPSULE | Freq: Three times a day (TID) | ORAL | 0 refills | Status: DC

## 2019-06-03 NOTE — ED Provider Notes (Signed)
[facility] DEPT Provider Note   CSN: [id number] Arrival date & time: 06/03/19  1531     History   Chief Complaint Chief Complaint  Patient presents with   Chest Pain   Generalized Body Aches    HPI Kelsey Santana is a 58 y.o. female.  HPI: A 58 year old patient with a history of hypertension, hypercholesterolemia and obesity presents for evaluation of chest pain. Initial onset of pain was more than 6 hours ago. The patient's chest pain is described as heaviness/pressure/tightness and is not worse with exertion. The patient complains of nausea. The patient's chest pain is middle- or left-sided, is not well-localized, is not sharp and does not radiate to the arms/jaw/neck. The patient denies diaphoresis. The patient has no history of stroke, has no history of peripheral artery disease, has not smoked in the past 90 days, denies any history of treated diabetes and has no relevant family history of coronary artery disease (first degree relative at less than age 28).  Patient presented to the ED with several complaints.  She has been having chronic pain in her hips and knees.  Patient states this has been ongoing for a long period of time.  She has been to urgent cares at various times for this complaint.  She has a primary care doctor and was referred to a rheumatologist for this for another month or 2.  Patient also started having pain in her chest today.  She has had this off and on for years and previously had negative stress test.  He does not have any known heart disease. HPI  Past Medical History:  Diagnosis Date   Back pain    l 4 and l5 djd   GERD (gastroesophageal reflux disease)    Hypercholesteremia    Hypertension    Migraine    Pre-diabetes     Patient Active Problem List   Diagnosis Date Noted   Chest pain, musculoskeletal    'light-for-dates' infant with signs of fetal malnutrition 06/07/2017   Hypocalcemia 06/07/2017    Vitamin D deficiency 06/07/2017   Chest pain 11/15/2015   Depression 04/15/2012   Morbid obesity due to excess calories ([place]) 04/15/2012   Tinea corporis 04/15/2012   Fatigue 04/15/2012   Epigastric pain 03/19/2012   Migraine headache 03/11/2012   GERD (gastroesophageal reflux disease) 03/11/2012   HTN (hypertension) 03/11/2012   HLD (hyperlipidemia) 03/11/2012    Past Surgical History:  Procedure Laterality Date   ABDOMINAL HYSTERECTOMY     complete   COLONOSCOPY N/A 01/16/2017   Procedure: COLONOSCOPY;  Surgeon: Carol Ada, MD;  Location: [facility];  Service: Endoscopy;  Laterality: N/A;   ESOPHAGOGASTRODUODENOSCOPY N/A 01/16/2017   Procedure: ESOPHAGOGASTRODUODENOSCOPY (EGD);  Surgeon: Carol Ada, MD;  Location: [facility];  Service: Endoscopy;  Laterality: N/A;     OB History   No obstetric history on file.      Home Medications    Prior to Admission medications   Medication Sig Start Date End Date Taking? Authorizing Provider  amLODipine (NORVASC) 5 MG tablet Take 1 tablet (5 mg total) by mouth at bedtime. 02/17/19   Dhungel, Flonnie Overman, MD  atorvastatin (LIPITOR) 20 MG tablet Take 1 tablet (20 mg total) by mouth at bedtime. 02/17/19   Dhungel, Flonnie Overman, MD  cyclobenzaprine (FLEXERIL) 10 MG tablet Take 1 tablet (10 mg total) by mouth 2 (two) times daily as needed for muscle spasms. Patient not taking: Reported on 03/04/2019 10/19/17   Delia Heady, PA-C  dihydroergotamine [facility])  4 MG/ML nasal spray Place 1 spray into the nose as needed for migraine. Use in one nostril as directed.  No more than 4 sprays in one hour 07/24/18   Narda Amber K, DO  DULoxetine (CYMBALTA) 60 MG capsule Take 60 mg by mouth daily.    [provider]  etodolac (LODINE) 300 MG capsule Take 1 capsule (300 mg total) by mouth every 8 (eight) hours. 06/03/19   Dorie Rank, MD  gabapentin (NEURONTIN) 300 MG capsule Take 300 mg by mouth 2 (two) times daily.    [provider]  HYDROcodone-acetaminophen (NORCO/VICODIN) 5-325 MG tablet Take 1 tablet by mouth every 6 (six) hours as needed for moderate pain or severe pain. 05/27/19   Vanessa Kick, MD  ibuprofen (ADVIL) 200 MG tablet Take 2 tablets (400 mg total) by mouth every 8 (eight) hours as needed for headache. Patient not taking: Reported on 03/04/2019 02/17/19   Dhungel, Nishant, MD  lidocaine (XYLOCAINE) 5 % ointment APPLY TO THIGH DAILY AS NEEDED FOR PAIN 02/17/19   Dhungel, Flonnie Overman, MD  naproxen (NAPROSYN) 500 MG tablet Take 1 tablet (500 mg total) by mouth 2 (two) times daily. 10/19/17   Khatri, Hina, PA-C  nortriptyline (PAMELOR) 10 MG capsule Start 10mg  at bedtime for 2 weeks, then increase to 2 tablet at bedtime Patient taking differently: Take 20 mg by mouth at bedtime.  06/28/18   Patel, Arvin Collard K, DO  pantoprazole (PROTONIX) 20 MG tablet Take 20 mg by mouth daily.    [provider]  predniSONE (STERAPRED UNI-PAK 21 TAB) 10 MG (21) TBPK tablet Take by mouth daily. Take as directed. 05/27/19   Vanessa Kick, MD  SUMAtriptan (IMITREX) 50 MG tablet Take 1 tablet (50 mg total) by mouth every 2 (two) hours as needed for migraine. May repeat in 2 hours if headache persists or recurs. Patient not taking: Reported on 03/04/2019 05/14/17   Narda Amber K, DO  traMADol (ULTRAM) 50 MG tablet Take 1 tablet (50 mg total) by mouth every 6 (six) hours as needed. Patient not taking: Reported on 03/04/2019 04/11/17   Veryl Speak, MD  Vitamin D, Ergocalciferol, (DRISDOL) 1.25 MG (50000 UT) CAPS capsule Take 50,000 Units by mouth 3 (three) times a week.    [provider]    Family History Family History  Problem Relation Age of Onset   Alcohol abuse Mother    Diabetes Mother    Hyperlipidemia Mother    Hypertension Mother    Diabetes Brother    Hyperlipidemia Brother    Hypertension Brother    Thyroid disease Neg Hx     Social History Social History   Tobacco Use   Smoking  status: Former Smoker   Smokeless tobacco: Former Systems developer   Tobacco comment: quit 28 yrs ago  Substance Use Topics   Alcohol use: No   Drug use: No     Allergies   Ciprofloxacin, Hydrocortisone, and Ace inhibitors   Review of Systems Review of Systems  Constitutional: Negative for fever.  Respiratory: Negative for shortness of breath.   Cardiovascular: Positive for chest pain.  Gastrointestinal: Negative for abdominal pain.  Musculoskeletal: Positive for arthralgias. Negative for joint swelling and neck stiffness.  Neurological: Positive for headaches.  All other systems reviewed and are negative.    Physical Exam Updated Vital Signs BP (!) 157/95    Pulse 66    Temp 98.2 F (36.8 C) (Oral)    Resp (!) 21    Wt 132 kg  SpO2 97%    BMI 48.43 kg/m   Physical Exam Vitals signs and nursing note reviewed.  Constitutional:      General: She is not in acute distress.    Appearance: She is well-developed. She is obese.  HENT:     Head: Normocephalic and atraumatic.     Right Ear: External ear normal.     Left Ear: External ear normal.  Eyes:     General: No scleral icterus.       Right eye: No discharge.        Left eye: No discharge.     Conjunctiva/sclera: Conjunctivae normal.  Neck:     Musculoskeletal: Neck supple.     Trachea: No tracheal deviation.  Cardiovascular:     Rate and Rhythm: Normal rate and regular rhythm.  Pulmonary:     Effort: Pulmonary effort is normal. No respiratory distress.     Breath sounds: Normal breath sounds. No stridor. No wheezing or rales.  Abdominal:     General: Bowel sounds are normal. There is no distension.     Palpations: Abdomen is soft.     Tenderness: There is no abdominal tenderness. There is no guarding or rebound.  Musculoskeletal:        General: No tenderness.     Right lower leg: No edema.     Left lower leg: No edema.     Comments: No joint swelling noted  Skin:    General: Skin is warm and dry.     Findings:  No rash.  Neurological:     Mental Status: She is alert.     Cranial Nerves: No cranial nerve deficit (no facial droop, extraocular movements intact, no slurred speech).     Sensory: No sensory deficit.     Motor: No abnormal muscle tone or seizure activity.     Coordination: Coordination normal.      ED Treatments / Results  Labs (all labs ordered are listed, but only abnormal results are displayed) Labs Reviewed  BASIC METABOLIC PANEL - Abnormal; Notable for the following components:      Result Value   Glucose, Bld 116 (*)    Calcium 8.8 (*)    All other components within normal limits  CBC  I-STAT BETA HCG BLOOD, ED ([facility], [facility], [facility] ONLY)  TROPONIN I (HIGH SENSITIVITY)  TROPONIN I (HIGH SENSITIVITY)    EKG EKG Interpretation  Date/Time:  Tuesday June 03 2019 15:38:31 EDT Ventricular Rate:  82 PR Interval:    QRS Duration: 89 QT Interval:  363 QTC Calculation: 424 R Axis:   37 Text Interpretation:  Sinus rhythm Ventricular premature complex Baseline wander in lead(s) III V4 Since last tracing rate faster Confirmed by Dorie Rank [phone]) on 06/03/2019 3:45:04 PM   Radiology Dg Chest 2 View  Result Date: 06/03/2019 CLINICAL DATA:  Pt states that she is having severe chest pain. Pt also complains of bilateral knee and bilateral hip pain without acute injury Pt state that she has been unable to walk the last 3 days due to the pain. EXAM: CHEST - 2 VIEW COMPARISON:  Chest radiograph 02/19/2019 FINDINGS: Stable cardiomediastinal contours. The lungs are clear. No pneumothorax or pleural effusion. Multilevel degenerative changes in the thoracic spine. IMPRESSION: No acute cardiopulmonary process. Electronically Signed   By: Audie Pinto M.D.   On: 06/03/2019 18:00   Dg Knee Complete 4 Views Left  Result Date: 06/03/2019 CLINICAL DATA:  58 year old female with history of bilateral knee pain. EXAM:  LEFT KNEE - COMPLETE 4+ VIEW COMPARISON:  No priors. FINDINGS: No evidence  of fracture, dislocation, or joint effusion. Mild tricompartmental joint space narrowing, subchondral sclerosis and osteophyte formation, compatible with mild osteoarthritis. Soft tissues are unremarkable. IMPRESSION: 1. No acute radiographic abnormality of the left knee. 2. Mild tricompartmental osteoarthritis. Electronically Signed   By: Vinnie Langton M.D.   On: 06/03/2019 18:15   Dg Knee Complete 4 Views Right  Result Date: 06/03/2019 CLINICAL DATA:  58 year old female with history of bilateral knee pain. EXAM: RIGHT KNEE - COMPLETE 4+ VIEW COMPARISON:  No priors. FINDINGS: No evidence of fracture, dislocation, or joint effusion. Tricompartmental joint space narrowing, subchondral sclerosis and osteophyte formation, compatible with osteoarthritis. Soft tissues are unremarkable. IMPRESSION: 1. No acute radiographic abnormality of the right knee. 2. Moderate tricompartmental osteoarthritis. Electronically Signed   By: Vinnie Langton M.D.   On: 06/03/2019 18:14   Dg Hips Bilat W Or Wo Pelvis 2 Views  Result Date: 06/03/2019 CLINICAL DATA:  Pt states that she is having severe chest pain. Pt also complains of bilateral knee and bilateral hip pain without acute injury Pt state that she has been unable to walk the last 3 days due to the pain . EXAM: DG HIP (WITH OR WITHOUT PELVIS) 2V BILAT COMPARISON:  Left hip radiographs 10/19/2017 FINDINGS: There is no evidence of hip fracture or dislocation. No focal bony abnormality. There are enthesopathic changes bilaterally at the anterior iliac spines and greater trochanters of the femurs. SI joints are open. Nonobstructive bowel gas pattern. Radiopaque densities in the bilateral pelvis likely represent vascular phleboliths. IMPRESSION: No acute osseous abnormality in the bilateral hips. Electronically Signed   By: Audie Pinto M.D.   On: 06/03/2019 18:09    Procedures Procedures (including critical care time)  Medications Ordered in ED Medications   sodium chloride flush (NS) 0.9 % injection 3 mL (3 mLs Intravenous Given 06/03/19 1907)  HYDROcodone-acetaminophen (NORCO/VICODIN) 5-325 MG per tablet 1 tablet (1 tablet Oral Given 06/03/19 1740)     Initial Impression / Assessment and Plan / ED Course  I have reviewed the triage vital signs and the nursing notes.  Pertinent labs & imaging results that were available during my care of the patient were reviewed by me and considered in my medical decision making (see chart for details).     HEAR Score: 4 Patient presented to the ED for evaluation complaints of chest pain as well as joint pain.  Has a heart score of 4.  However serial troponins are negative.  Patient symptoms are atypical and have been ongoing for a while.  Have low suspicion for acute coronary syndrome.    Patient is having bilateral knee pain as well as some pain in her hips.  She does have osteoarthritis and obesity.  This may be the main cause of her pain.  She is not having any signs of infection.  Discussed outpatient follow-up with an orthopedic doctor.  At this time there does not appear to be any evidence of an acute emergency medical condition and the patient appears stable for discharge with appropriate outpatient follow up.   Final Clinical Impressions(s) / ED Diagnoses   Final diagnoses:  Chest pain, unspecified type  Osteoarthritis of both knees, unspecified osteoarthritis type    ED Discharge Orders         Ordered    etodolac (LODINE) 300 MG capsule  Every 8 hours    Note to Pharmacy: As needed for pain  06/03/19 2106           Dorie Rank, MD 06/03/19 2109

## 2019-06-03 NOTE — Discharge Instructions (Addendum)
Follow-up with your primary care doctor regarding your chest pain and consider seeing an orthopedic doctor regarding the arthritis in your knees.  Take the medications as needed for pain.

## 2019-06-03 NOTE — ED Notes (Signed)
Patient transported to X-ray 

## 2019-06-03 NOTE — ED Notes (Signed)
Patient ambulated to restroom with no assistance.  

## 2019-06-03 NOTE — ED Triage Notes (Signed)
Pt states that she is having severe chest pain. Pt also complains of knee and hip pain. Pt state that she has bene unable to walk the last 3 days due to the pain . Pt states that her bones are "achy".  Pt states she was given hydrocodone and prednisone which helped.

## 2019-07-09 ENCOUNTER — Encounter: Payer: Self-pay | Admitting: Physical Medicine and Rehabilitation

## 2019-07-09 ENCOUNTER — Telehealth: Payer: Self-pay | Admitting: *Deleted

## 2019-07-09 ENCOUNTER — Ambulatory Visit: Payer: No Typology Code available for payment source | Admitting: Physical Medicine and Rehabilitation

## 2019-07-09 ENCOUNTER — Encounter
Payer: No Typology Code available for payment source | Attending: Physical Medicine and Rehabilitation | Admitting: Physical Medicine and Rehabilitation

## 2019-07-09 ENCOUNTER — Other Ambulatory Visit: Payer: Self-pay

## 2019-07-09 VITALS — Temp 97.5°F | Ht 65.0 in | Wt 295.0 lb

## 2019-07-09 DIAGNOSIS — G894 Chronic pain syndrome: Secondary | ICD-10-CM | POA: Insufficient documentation

## 2019-07-09 DIAGNOSIS — Z79891 Long term (current) use of opiate analgesic: Secondary | ICD-10-CM | POA: Diagnosis not present

## 2019-07-09 DIAGNOSIS — Z5181 Encounter for therapeutic drug level monitoring: Secondary | ICD-10-CM | POA: Insufficient documentation

## 2019-07-09 MED ORDER — HYDROCODONE-ACETAMINOPHEN 5-325MG PREPACK (~~LOC~~: ORAL_TABLET | ORAL | 0 refills | Status: DC

## 2019-07-09 MED ORDER — HYDROCODONE-ACETAMINOPHEN 5-325 MG PO TABS: 1.0000 | ORAL_TABLET | Freq: Four times a day (QID) | ORAL | 0 refills | Status: DC | PRN

## 2019-07-09 NOTE — Progress Notes (Signed)
Subjective:    Patient ID: Kelsey Santana, female    DOB: 1961-08-09, 58 y.o.   MRN: [id number]  HPI Kelsey Santana is a 58 year old woman with bilateral knee pain and left sided hip pain and bilateral low back pain.  She has been having severe pain in her bilateral knees. She is planning to have knee surgery but she does not yet have a date. She has had shots (steroid) in her knee and has been using Hydrocodone. She has had a couples falls. She is trying to lose weight but is on prednisone but it doesn't help. The combination of prednisone and hydrocodone helps her with the pain. If her pain is not severe, she does not take the hydrocodone. Her pain has significantly lowered her quality of life and her ability to cook and clean. She is able to walk but is in severe pain. She has been using a cane. Just going from her bedroom to her bathroom causes significant pain.  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   Pain Inventory Average Pain 8 Pain Right Now 10 My pain is constant, sharp, stabbing and aching  In the last 24 hours, has pain interfered with the following? General activity 10 Relation with others 10 Enjoyment of life 10 What TIME of day is your pain at its worst? all Sleep (in general) Poor  Pain is worse with: walking, bending, sitting, inactivity and standing Pain improves with: pacing activities, medication and injections Relief from Meds: 10  Mobility walk with assistance use a cane ability to climb steps?  no do you drive?  yes  Function not employed: date last employed . I need assistance with the following:  bathing, meal prep, household duties and shopping  Neuro/Psych weakness trouble walking  Prior Studies new  Physicians involved in your care new   Family History  Problem Relation Age of Onset  . Alcohol abuse Mother   . Diabetes Mother   . Hyperlipidemia Mother   . Hypertension Mother   . Diabetes Brother   .  Hyperlipidemia Brother   . Hypertension Brother   . Thyroid disease Neg Hx    Social History   Socioeconomic History  . Marital status: Single    Spouse name: Not on file  . Number of children: Not on file  . Years of education: Not on file  . Highest education level: Not on file  Occupational History  . Not on file  Social Needs  . Financial resource strain: Not on file  . Food insecurity    Worry: Not on file    Inability: Not on file  . Transportation needs    Medical: Not on file    Non-medical: Not on file  Tobacco Use  . Smoking status: Former Research scientist (life sciences)  . Smokeless tobacco: Former Systems developer  . Tobacco comment: quit 28 yrs ago  Substance and Sexual Activity  . Alcohol use: No  . Drug use: No  . Sexual activity: Never  Lifestyle  . Physical activity    Days per week: Not on file    Minutes per session: Not on file  . Stress: Not on file  Relationships  . Social Herbalist on phone: Not on file    Gets together: Not on file    Attends religious service: Not on file    Active member of club or organization: Not on file    Attends meetings of clubs  or organizations: Not on file    Relationship status: Not on file  Other Topics Concern  . Not on file  Social History Narrative   epworth sleepiness scale = 10 (11/15/15)    Lives alone in a 2 story home.  Has 3 children.  Currently not working.  Has lost 6 jobs in the past 2 months due to her leg numbness and pain.     Education: 2 years of college.   Past Surgical History:  Procedure Laterality Date  . ABDOMINAL HYSTERECTOMY     complete  . COLONOSCOPY N/A 01/16/2017   Procedure: COLONOSCOPY;  Surgeon: Carol Ada, MD;  Location: [facility];  Service: Endoscopy;  Laterality: N/A;  . ESOPHAGOGASTRODUODENOSCOPY N/A 01/16/2017   Procedure: ESOPHAGOGASTRODUODENOSCOPY (EGD);  Surgeon: Carol Ada, MD;  Location: [facility];  Service: Endoscopy;  Laterality: N/A;   Past Medical History:  Diagnosis Date  .  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 GERD (gastroesophageal reflux disease)   . Hypercholesteremia   . Hypertension   . Migraine   . Pre-diabetes    Temp (!) 97.5 F (36.4 C)   Ht 5\' 5"  (1.651 m)   Wt 295 lb (133.8 kg)   BMI 49.09 kg/m   Opioid Risk Score:   Fall Risk Score:  `1  Depression screen PHQ 2/9  Depression screen [facility] 2/9 07/09/2019 03/19/2012  Decreased Interest 0 0  Down, Depressed, Hopeless 0 0  PHQ - 2 Score 0 0     Review of Systems  Constitutional: Negative.   HENT: Negative.   Eyes: Negative.   Respiratory: Negative.   Cardiovascular: Negative.   Gastrointestinal: Negative.   Endocrine: Negative.   Genitourinary: Negative.   Musculoskeletal: Positive for arthralgias, back pain and gait problem.  Skin: Negative.   Allergic/Immunologic: Negative.   Hematological: Negative.   Psychiatric/Behavioral: Negative.   All other systems reviewed and are negative.      Objective:   Physical Exam Gen: no distress, normal appearing, obese HEENT: oral mucosa pink and moist, NCAT Cardio: Reg rate Chest: normal effort, normal rate of breathing Abd: soft, non-distended Ext: no edema Skin: intact Neuro/Musculoskeletal: AOx3. 5/5 strength in the bilateral upper extremities. She has 4/5 strength throughout RLE and 3/5 throughout LLE, limited to pain. Psych: pleasant, normal affect     Assessment & Plan:  Kelsey Santana is a 58 year old woman with bilateral knee pain and left sided hip pain and bilateral low back pain.  L knee osteoarthritis and posterior meniscal tear --Aquatherapy or swimming in [facility]. I will provide referral to Aquatherapy.  --Continue use of the cane to prevent falls.  --Educated that every pound of weight is 6 lbs on her knees, so one of the central goals of our treatment plan is to help her lose weight. --I will renew her Hydrocodone for pain control.  --She is in communications with orthopedic surgeon at the [facility] for knee replacement surgery.  I told her I would be happy to communicate with the surgeon as well if this would be helpful.   Thirty minutes of face to face patient care time were spent during this visit. All questions were encouraged and answered. Follow up with me in 4 weeks.

## 2019-07-09 NOTE — Telephone Encounter (Signed)
Patient left a message after leaving her appointment with Dr. Ranell Patrick today.  She went to the pharmacy and she was told that she was getting 12 tablets of the hydrocodone.  She was under some impression that she was going to get 24 tablets. She is concerned that she will not have enough to last her till her scheduled follow up on December 16th.  Please advise or contact patient to clarify

## 2019-07-11 NOTE — Telephone Encounter (Signed)
Contacted patient and left a voice message to please call us back to confirm if patient already picked up 12 tablets or if she is waiting for Dr. Ranell Patrick to submit a new script for 24 tablets.

## 2019-07-15 LAB — TOXASSURE SELECT,+ANTIDEPR,UR

## 2019-07-16 NOTE — Telephone Encounter (Signed)
I attempted multiple times to reach the patient. The numbers listed are either not belonging to her (according to the person answering x2) or not in service. I tried to reach out to [facility] but they are not answering the calls (only VM), to confirm if she picked the 12 hydrocodone up from pharmacy.  Since we have reached out multiple times without success, according to the PMP it was dispensed. I am closing encounter.

## 2019-07-21 ENCOUNTER — Telehealth: Payer: Self-pay | Admitting: *Deleted

## 2019-07-21 NOTE — Telephone Encounter (Signed)
Urine drug screen for this encounter is as expected --negative for medication.

## 2019-07-23 ENCOUNTER — Telehealth: Payer: Self-pay

## 2019-07-23 MED ORDER — HYDROCODONE-ACETAMINOPHEN 5-325 MG PO TABS: 1.0000 | ORAL_TABLET | Freq: Four times a day (QID) | ORAL | 0 refills | Status: DC | PRN

## 2019-07-23 NOTE — Telephone Encounter (Addendum)
Patient calling requesting refill on Hydrocodone, last filled #12 on 07/09/2019, next appt 08/06/2019. [facility].  Dr. Ranell Patrick: I will send refill, thank you Cordelia Pen!

## 2019-08-05 ENCOUNTER — Telehealth: Payer: Self-pay | Admitting: *Deleted

## 2019-08-05 NOTE — Telephone Encounter (Signed)
Patient left a message stating that she is needing another refill on her hydrocodone.  She says she is only getting 12 tablets at a time. She states she is sometimes needing to take 2 tabs a day because of the pain in her knee.  She is not able to sleep well at night. She saw the a PA at the [place] [facility]. The PA wants her to get a 2nd opinion from the attending, possible gel injection or nerve block? Unable to get into [place] [facility] until mid January. And patient is unable to make her December 16th appointment with Dr. Ranell Patrick due to Covid-19 exposure. So she is asking for another refill to be sent to her pharmacy for  her hydrocodone. (Side Note: in a previous telephone conversation their was discussion on doubling the pill count amount to #24 for longer coverage.)

## 2019-08-06 ENCOUNTER — Ambulatory Visit: Payer: No Typology Code available for payment source | Admitting: Physical Medicine and Rehabilitation

## 2019-08-07 NOTE — Telephone Encounter (Signed)
Hi Sybil, I see she has appointment scheduled with me tomorrow. If she cannot make it due to COVID symptoms, can this please be changed to a Webex or phone appointment? Thank you!

## 2019-08-07 NOTE — Telephone Encounter (Signed)
Kelsey Santana has called again about refilling her hydrocodone. It was last filled per PMP on 07/23/19 #12.  She is having to quarantine because of covid 19 symptoms though her test was negative but she is still having symptoms.  She had to cancel her 08/06/19 appt due to the covid testing and moved it to 08/25/19 with Dr Ranell Patrick. She can not see the ortho through [place] until mid January.

## 2019-08-08 ENCOUNTER — Encounter: Payer: Self-pay | Admitting: Physical Medicine and Rehabilitation

## 2019-08-08 ENCOUNTER — Other Ambulatory Visit: Payer: Self-pay

## 2019-08-08 ENCOUNTER — Encounter
Payer: No Typology Code available for payment source | Attending: Physical Medicine and Rehabilitation | Admitting: Physical Medicine and Rehabilitation

## 2019-08-08 VITALS — Ht 65.0 in | Wt 287.0 lb

## 2019-08-08 DIAGNOSIS — M1712 Unilateral primary osteoarthritis, left knee: Secondary | ICD-10-CM | POA: Diagnosis not present

## 2019-08-08 DIAGNOSIS — Z79891 Long term (current) use of opiate analgesic: Secondary | ICD-10-CM | POA: Insufficient documentation

## 2019-08-08 DIAGNOSIS — G894 Chronic pain syndrome: Secondary | ICD-10-CM | POA: Insufficient documentation

## 2019-08-08 DIAGNOSIS — Z5181 Encounter for therapeutic drug level monitoring: Secondary | ICD-10-CM | POA: Insufficient documentation

## 2019-08-08 DIAGNOSIS — M25561 Pain in right knee: Secondary | ICD-10-CM

## 2019-08-08 MED ORDER — HYDROCODONE-ACETAMINOPHEN 5-325 MG PO TABS: 1.0000 | ORAL_TABLET | Freq: Two times a day (BID) | ORAL | 0 refills | Status: DC | PRN

## 2019-08-08 NOTE — Progress Notes (Signed)
Subjective:    Patient ID: Kelsey Santana, female    DOB: 11/02/1960, 58 y.o.   MRN: [id number]  HPI  58 year old woman who presents for follow up of chronic knee pain.  Pain continues to be severe. She followed with her orthopedist via Webex appointment and was told that surgery is the most appropriate treatment but that she is 40 pounds over weight limit. She says she cannot afford bariatric surgery.  The hydrocodone has been helping with the pain and helping her to be more functional--enabling her to stand to cook. She has wanted to swim at the [facility] but has been unable to tolerate getting there due to her pain.  She is interested in Synvisc injection for pain relief.   Pain Inventory Average Pain 8 Pain Right Now 8 My pain is constant and pulling   In the last 24 hours, has pain interfered with the following? General activity 8 Relation with others 8 Enjoyment of life 8 What TIME of day is your pain at its worst? all Sleep (in general) Poor  Pain is worse with: walking and standing Pain improves with: rest, heat/ice and medication Relief from Meds: 5  Mobility use a cane how many minutes can you walk? 0 ability to climb steps?  no do you drive?  yes  Function not employed: date last employed . disabled: date disabled . I need assistance with the following:  meal prep, household duties and shopping  Neuro/Psych weakness trouble walking depression  Prior Studies x-rays CT/MRI  Physicians involved in your care Orthopedist .   Family History  Problem Relation Age of Onset  . Alcohol abuse Mother   . Diabetes Mother   . Hyperlipidemia Mother   . Hypertension Mother   . Diabetes Brother   . Hyperlipidemia Brother   . Hypertension Brother   . Thyroid disease Neg Hx    Social History   Socioeconomic History  . Marital status: Single    Spouse name: Not on file  . Number of children: Not on file  . Years of education: Not on file  . Highest  education level: Not on file  Occupational History  . Not on file  Tobacco Use  . Smoking status: Former Research scientist (life sciences)  . Smokeless tobacco: Former Systems developer  . Tobacco comment: quit 28 yrs ago  Substance and Sexual Activity  . Alcohol use: No  . Drug use: No  . Sexual activity: Never  Other Topics Concern  . Not on file  Social History Narrative   epworth sleepiness scale = 10 (11/15/15)    Lives alone in a 2 story home.  Has 3 children.  Currently not working.  Has lost 6 jobs in the past 2 months due to her leg numbness and pain.     Education: 2 years of college.   Social Determinants of Health   Financial Resource Strain:   . Difficulty of Paying Living Expenses: Not on file  Food Insecurity:   . Worried About Charity fundraiser in the Last Year: Not on file  . Ran Out of Food in the Last Year: Not on file  Transportation Needs:   . Lack of Transportation (Medical): Not on file  . Lack of Transportation (Non-Medical): Not on file  Physical Activity:   . Days of Exercise per Week: Not on file  . Minutes of Exercise per Session: Not on file  Stress:   . Feeling of Stress : Not on file  Social  Connections:   . Frequency of Communication with Friends and Family: Not on file  . Frequency of Social Gatherings with Friends and Family: Not on file  . Attends Religious Services: Not on file  . Active Member of Clubs or Organizations: Not on file  . Attends Archivist Meetings: Not on file  . Marital Status: Not on file   Past Surgical History:  Procedure Laterality Date  . ABDOMINAL HYSTERECTOMY     complete  . COLONOSCOPY N/A 01/16/2017   Procedure: COLONOSCOPY;  Surgeon: Carol Ada, MD;  Location: [facility];  Service: Endoscopy;  Laterality: N/A;  . ESOPHAGOGASTRODUODENOSCOPY N/A 01/16/2017   Procedure: ESOPHAGOGASTRODUODENOSCOPY (EGD);  Surgeon: Carol Ada, MD;  Location: [facility];  Service: Endoscopy;  Laterality: N/A;   Past Medical History:  Diagnosis  Date  . 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 GERD (gastroesophageal reflux disease)   . Hypercholesteremia   . Hypertension   . Migraine   . Pre-diabetes    Ht 5\' 5"  (1.651 m)   Wt 287 lb (130.2 kg)   BMI 47.76 kg/m   Opioid Risk Score:   Fall Risk Score:  `1  Depression screen PHQ 2/9  Depression screen [facility] 2/9 07/09/2019 03/19/2012  Decreased Interest 0 0  Down, Depressed, Hopeless 0 0  PHQ - 2 Score 0 0     Review of Systems  Musculoskeletal:       Knee pain  Neurological: Positive for weakness.  All other systems reviewed and are negative.      Objective:   Physical Exam  Limited as patient was seen through Webex.  General: Appears to be in pain. Obese.  Left knee: Appears swollen.  Neuro: Alert and oriented.      Assessment & Plan:  58 year old woman who presents for follow up of chronic knee pain.  Left knee osteoarthritis --I will renew her Hydrocodone. Educated that opioids are not appropriate for chronic pain and the goal of this prescription will be to improve her pain and function until she can receive injection.  --Will schedule for left knee Synvisc injection for pain and functional benefit. --Surgical candidate but was told she needs to lose 40 lbs first. Dicussed with her the benefits of bariatric surgery for improved function and pain relief. Right now ability to afford the procedure is a barrier for her. If she would be able to get this surgery through the [place], this would be ideal.  Our team will apply for Synvisc prior auth. Return to clinic as soon as [place] obtained.

## 2019-08-14 ENCOUNTER — Telehealth: Payer: Self-pay | Admitting: Physical Medicine and Rehabilitation

## 2019-08-14 NOTE — Telephone Encounter (Signed)
Patient is having severe knee pain and unable to have surgery due to obesity. She would like to try Synvisc injection. Risks and benefits discussed and patient agrees to proceed with procedure.

## 2019-08-18 ENCOUNTER — Telehealth: Payer: Self-pay | Admitting: *Deleted

## 2019-08-18 NOTE — Telephone Encounter (Signed)
Kelsey Santana has called and says that she is out of her hydrocodone today and is having to take 3-4 per day to get relief and to sleep at night. She is requesting a refill to [facility].

## 2019-08-19 ENCOUNTER — Other Ambulatory Visit: Payer: Self-pay | Admitting: Physical Medicine and Rehabilitation

## 2019-08-19 MED ORDER — HYDROCODONE-ACETAMINOPHEN 5-325 MG PO TABS: 1.0000 | ORAL_TABLET | Freq: Two times a day (BID) | ORAL | 0 refills | Status: DC | PRN

## 2019-08-19 NOTE — Telephone Encounter (Signed)
Thank you for letting me know, I will place the order within the next 15 min!

## 2019-08-19 NOTE — Telephone Encounter (Addendum)
You will have to place the order.  We cannot refill or call in  CII narcotics. CIII 's like tylenol #3 we can call in but this is CII hydrocodone.

## 2019-08-19 NOTE — Telephone Encounter (Signed)
Medication can be refilled, thank you!

## 2019-08-19 NOTE — Telephone Encounter (Signed)
I let the Ms Condra know you would send in.

## 2019-08-25 ENCOUNTER — Ambulatory Visit: Payer: No Typology Code available for payment source | Admitting: Physical Medicine and Rehabilitation

## 2019-08-26 ENCOUNTER — Encounter
Payer: No Typology Code available for payment source | Attending: Physical Medicine and Rehabilitation | Admitting: Physical Medicine and Rehabilitation

## 2019-08-26 ENCOUNTER — Encounter: Payer: Self-pay | Admitting: Physical Medicine and Rehabilitation

## 2019-08-26 ENCOUNTER — Other Ambulatory Visit: Payer: Self-pay

## 2019-08-26 VITALS — BP 166/77 | HR 75 | Temp 97.5°F | Ht 65.0 in | Wt 298.0 lb

## 2019-08-26 DIAGNOSIS — E559 Vitamin D deficiency, unspecified: Secondary | ICD-10-CM | POA: Diagnosis present

## 2019-08-26 DIAGNOSIS — Z5181 Encounter for therapeutic drug level monitoring: Secondary | ICD-10-CM | POA: Diagnosis present

## 2019-08-26 DIAGNOSIS — Z6839 Body mass index (BMI) 39.0-39.9, adult: Secondary | ICD-10-CM | POA: Diagnosis present

## 2019-08-26 DIAGNOSIS — M1712 Unilateral primary osteoarthritis, left knee: Secondary | ICD-10-CM

## 2019-08-26 DIAGNOSIS — G894 Chronic pain syndrome: Secondary | ICD-10-CM | POA: Insufficient documentation

## 2019-08-26 DIAGNOSIS — Z79891 Long term (current) use of opiate analgesic: Secondary | ICD-10-CM | POA: Diagnosis present

## 2019-08-26 MED ORDER — HYDROCODONE-ACETAMINOPHEN 5-325 MG PO TABS: 1.0000 | ORAL_TABLET | Freq: Two times a day (BID) | ORAL | 0 refills | Status: DC | PRN

## 2019-08-26 NOTE — Progress Notes (Signed)
Left Knee injection  Indication: Knee pain not relieved by medication management and other conservative care.  Informed consent was obtained after describing risks and benefits of the procedure with the patient, this includes bleeding, bruising, infection and medication side effects. The patient wishes to proceed and has given written consent. The patient was placed in a seated position. The lateral aspect of the left knee was marked and prepped with Betadine and alcohol. After negative draw back for blood, a solution containing Monovisc was injected with a 25 gauge 1.5 inch needle. The patient tolerated the procedure well. Post procedure instructions were given.

## 2019-09-05 ENCOUNTER — Ambulatory Visit: Payer: No Typology Code available for payment source | Admitting: Physical Medicine and Rehabilitation

## 2019-09-10 ENCOUNTER — Other Ambulatory Visit: Payer: Self-pay

## 2019-09-10 ENCOUNTER — Encounter: Payer: No Typology Code available for payment source | Admitting: Physical Medicine and Rehabilitation

## 2019-09-10 ENCOUNTER — Encounter (HOSPITAL_BASED_OUTPATIENT_CLINIC_OR_DEPARTMENT_OTHER): Payer: No Typology Code available for payment source | Admitting: Physical Medicine and Rehabilitation

## 2019-09-10 ENCOUNTER — Encounter: Payer: Self-pay | Admitting: Physical Medicine and Rehabilitation

## 2019-09-10 VITALS — BP 138/72 | Ht 65.0 in | Wt 238.0 lb

## 2019-09-10 DIAGNOSIS — Z6839 Body mass index (BMI) 39.0-39.9, adult: Secondary | ICD-10-CM | POA: Diagnosis not present

## 2019-09-10 DIAGNOSIS — M1712 Unilateral primary osteoarthritis, left knee: Secondary | ICD-10-CM | POA: Diagnosis not present

## 2019-09-10 MED ORDER — HYDROCODONE-ACETAMINOPHEN 10-325 MG PO TABS: 1.0000 | ORAL_TABLET | Freq: Four times a day (QID) | ORAL | 0 refills | Status: DC | PRN

## 2019-09-10 NOTE — Addendum Note (Signed)
Addended by: Izora Ribas on: 09/10/2019 12:33 PM   Modules accepted: Level of Service

## 2019-09-10 NOTE — Progress Notes (Addendum)
Subjective:    Patient ID: Kelsey Santana, female    DOB: 1960/11/06, 59 y.o.   MRN: [id number]  HPI  Due to national recommendations of social distancing because of COVID 17, an audio/video tele-health visit is felt to be the most appropriate encounter for this patient at this time. See MyChart message from today for the patient's consent to a tele-health encounter with [facility]. This is a follow up tele-visit via phone. The patient is at home. MD is at office.   59 year old woman who presents for follow up of chronic knee pain.  Pain continues to be severe. She followed with her orthopedist via Webex appointment and was told that surgery is the most appropriate treatment but that she is 40 pounds over weight limit. She says she cannot afford bariatric surgery.  The hydrocodone has been helping with the pain and helping her to be more functional--enabling her to stand to cook. She has wanted to swim [facility] the [facility] but has been unable to tolerate getting there due to her pain. She started to take 2 hydrocodone tablets 4 times per day because the pain is so debilitating. She says she is willing to try any method of pain relief and would like to wean off the hydrocodone as soon as her pain is better controlled as she does not want to be addicted.   She had Synvisc injection on 1/05 and has not felt much relief yet. Her pain worsened on Sunday.   She is also interested in weight loss pills. She has been controlling her diet well but has been unable to engage in exercise or much movement due to her pain.   Pain Inventory Average Pain 10 Pain Right Now 5 My pain is dull and pulled and crushed  In the last 24 hours, has pain interfered with the following? General activity 5 Relation with others 5 Enjoyment of life 5 What TIME of day is your pain at its worst? daytime Sleep (in general) Poor  Pain is worse with: walking, bending, standing and some  activites Pain improves with: heat/ice and medication Relief from Meds: 6  Mobility walk without assistance use a cane ability to climb steps?  yes do you drive?  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  no  Physicians involved in your care Any changes since last visit?  no   Family History  Problem Relation Age of Onset  . Alcohol abuse Mother   . Diabetes Mother   . Hyperlipidemia Mother   . Hypertension Mother   . Diabetes Brother   . Hyperlipidemia Brother   . Hypertension Brother   . Thyroid disease Neg Hx    Social History   Socioeconomic History  . Marital status: Single    Spouse name: Not on file  . Number of children: Not on file  . Years of education: Not on file  . Highest education level: Not on file  Occupational History  . Not on file  Tobacco Use  . Smoking status: Former Research scientist (life sciences)  . Smokeless tobacco: Former Systems developer  . Tobacco comment: quit 28 yrs ago  Substance and Sexual Activity  . Alcohol use: No  . Drug use: No  . Sexual activity: Never  Other Topics Concern  . Not on file  Social History Narrative   epworth sleepiness scale = 10 (11/15/15)  Lives alone in a 2 story home.  Has 3 children.  Currently not working.  Has lost 6 jobs in the past 2 months due to her leg numbness and pain.     Education: 2 years of college.   Social Determinants of Health   Financial Resource Strain:   . Difficulty of Paying Living Expenses: Not on file  Food Insecurity:   . Worried About Charity fundraiser in the Last Year: Not on file  . Ran Out of Food in the Last Year: Not on file  Transportation Needs:   . Lack of Transportation (Medical): Not on file  . Lack of Transportation (Non-Medical): Not on file  Physical Activity:   . Days of Exercise per Week: Not on file  . Minutes of Exercise per Session: Not on file  Stress:   .  Feeling of Stress : Not on file  Social Connections:   . Frequency of Communication with Friends and Family: Not on file  . Frequency of Social Gatherings with Friends and Family: Not on file  . Attends Religious Services: Not on file  . Active Member of Clubs or Organizations: Not on file  . Attends Archivist Meetings: Not on file  . Marital Status: Not on file   Past Surgical History:  Procedure Laterality Date  . ABDOMINAL HYSTERECTOMY     complete  . COLONOSCOPY N/A 01/16/2017   Procedure: COLONOSCOPY;  Surgeon: Carol Ada, MD;  Location: [facility];  Service: Endoscopy;  Laterality: N/A;  . ESOPHAGOGASTRODUODENOSCOPY N/A 01/16/2017   Procedure: ESOPHAGOGASTRODUODENOSCOPY (EGD);  Surgeon: Carol Ada, MD;  Location: [facility];  Service: Endoscopy;  Laterality: N/A;   Past Medical History:  Diagnosis Date  . 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 GERD (gastroesophageal reflux disease)   . Hypercholesteremia   . Hypertension   . Migraine   . Pre-diabetes    There were no vitals taken for this visit.  Opioid Risk Score:   Fall Risk Score:  `1  Depression screen PHQ 2/9  Depression screen [facility] 2/9 07/09/2019 03/19/2012  Decreased Interest 0 0  Down, Depressed, Hopeless 0 0  PHQ - 2 Score 0 0  \  Review of Systems  Constitutional: Negative.   HENT: Negative.   Eyes: Negative.   Respiratory: Negative.   Cardiovascular: Negative.   Gastrointestinal: Negative.   Endocrine: Negative.   Genitourinary: Negative.   Musculoskeletal: Positive for gait problem.  Skin: Negative.   Allergic/Immunologic: Negative.   Neurological: Positive for weakness and numbness.  Hematological: Negative.   Psychiatric/Behavioral: Negative.   All other systems reviewed and are negative.      Objective:   Physical Exam  Not performed as seen through phone.     Assessment & Plan:  59 year old woman who presents for follow up of chronic knee pain.  Left knee  osteoarthritis --I will renew her Hydrocodone with increased dose of 10mg  q6H prn for 9 days. Educated that opioids are not appropriate for chronic pain and the goal of this prescription will be to improve her pain and function until she can receive injection.  --Educated that peak Synvisc benefit is usually seen around weeks 4-6. --Surgical candidate but was told she needs to lose 40 lbs first. Dicussed with her the benefits of bariatric surgery for improved function and pain relief. Right now ability to afford the procedure is a barrier for her. If she would be able to get this surgery  through the [place], this would be ideal. I have provided her with external referral to bariatric surgery to discuss any options she may be able to afford as well as weight loss medications she could be prescribed in the interim.  20 minutes of face to face patient care time were spent during this visit. All questions were encouraged and answered Return for scheduled appointment on 1/29 to assess progress with above plan.

## 2019-09-13 ENCOUNTER — Emergency Department (HOSPITAL_COMMUNITY)
Admission: EM | Admit: 2019-09-13 | Discharge: 2019-09-14 | Disposition: A | Discharge status: Home / Self Care | Payer: Non-veteran care | Attending: Emergency Medicine | Admitting: Emergency Medicine

## 2019-09-13 ENCOUNTER — Other Ambulatory Visit: Payer: Self-pay

## 2019-09-13 ENCOUNTER — Encounter (HOSPITAL_COMMUNITY): Payer: Self-pay | Admitting: *Deleted

## 2019-09-13 ENCOUNTER — Emergency Department (HOSPITAL_COMMUNITY): Payer: Non-veteran care

## 2019-09-13 DIAGNOSIS — Z87891 Personal history of nicotine dependence: Secondary | ICD-10-CM | POA: Insufficient documentation

## 2019-09-13 DIAGNOSIS — I10 Essential (primary) hypertension: Secondary | ICD-10-CM | POA: Diagnosis not present

## 2019-09-13 DIAGNOSIS — R079 Chest pain, unspecified: Secondary | ICD-10-CM

## 2019-09-13 LAB — CBC
HCT: 41.8 % (ref 36.0–46.0)
Hemoglobin: 13.4 g/dL (ref 12.0–15.0)
MCH: 28.6 pg (ref 26.0–34.0)
MCHC: 32.1 g/dL (ref 30.0–36.0)
MCV: 89.3 fL (ref 80.0–100.0)
Platelets: 243 10*3/uL (ref 150–400)
RBC: 4.68 MIL/uL (ref 3.87–5.11)
RDW: 13.6 % (ref 11.5–15.5)
WBC: 7 10*3/uL (ref 4.0–10.5)
nRBC: 0 % (ref 0.0–0.2)

## 2019-09-13 LAB — TROPONIN I (HIGH SENSITIVITY): Troponin I (High Sensitivity): 6 ng/L (ref <18)

## 2019-09-13 LAB — BASIC METABOLIC PANEL
Anion gap: 8 (ref 5–15)
BUN: 13 mg/dL (ref 6–20)
CO2: 27 mmol/L (ref 22–32)
Calcium: 8.8 mg/dL — ABNORMAL LOW (ref 8.9–10.3)
Chloride: 106 mmol/L (ref 98–111)
Creatinine, Ser: 0.78 mg/dL (ref 0.44–1.00)
GFR calc Af Amer: >60 mL/min (ref >60)
GFR calc non Af Amer: >60 mL/min (ref >60)
Glucose, Bld: 110 mg/dL — ABNORMAL HIGH (ref 70–99)
Potassium: 3.8 mmol/L (ref 3.5–5.1)
Sodium: 141 mmol/L (ref 135–145)

## 2019-09-13 MED ORDER — SUCRALFATE 1 G PO TABS: 1.0000 g | ORAL_TABLET | Freq: Four times a day (QID) | ORAL | 0 refills | Status: DC | PRN

## 2019-09-13 MED ORDER — SODIUM CHLORIDE 0.9% FLUSH: 3.0000 mL | Freq: Once | INTRAVENOUS | Status: DC

## 2019-09-13 NOTE — ED Provider Notes (Signed)
[facility] Emergency Department Provider Note MRN:  [id number]  Arrival date & time: 09/13/19     Chief Complaint   Chest Pain   History of Present Illness   Kelsey Santana is a 59 y.o. year-old female with a history of hypertension presenting to the ED with chief complaint of chest pain.  2 days of constant chest pain, center of the chest, radiates to the right and left sides.  Began after eating chicken salad and then ate an apple pie this evening.  Associated with some diaphoresis, feels better now.  Denies leg pain or swelling, no abdominal pain, no fever, no cough.  Pain was mild to moderate, no other exacerbating or alleviating factors.  Review of Systems  A complete 10 system review of systems was obtained and all systems are negative except as noted in the HPI and PMH.   Patient's Health History    Past Medical History:  Diagnosis Date  . 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 GERD (gastroesophageal reflux disease)   . Hypercholesteremia   . Hypertension   . Migraine   . Pre-diabetes     Past Surgical History:  Procedure Laterality Date  . ABDOMINAL HYSTERECTOMY     complete  . COLONOSCOPY N/A 01/16/2017   Procedure: COLONOSCOPY;  Surgeon: Carol Ada, MD;  Location: [facility];  Service: Endoscopy;  Laterality: N/A;  . ESOPHAGOGASTRODUODENOSCOPY N/A 01/16/2017   Procedure: ESOPHAGOGASTRODUODENOSCOPY (EGD);  Surgeon: Carol Ada, MD;  Location: [facility];  Service: Endoscopy;  Laterality: N/A;    Family History  Problem Relation Age of Onset  . Alcohol abuse Mother   . Diabetes Mother   . Hyperlipidemia Mother   . Hypertension Mother   . Diabetes Brother   . Hyperlipidemia Brother   . Hypertension Brother   . Thyroid disease Neg Hx     Social History   Socioeconomic History  . Marital status: Single    Spouse name: Not on file  . Number of children: Not on file  . Years of education: Not on file  . Highest education level:  Not on file  Occupational History  . Not on file  Tobacco Use  . Smoking status: Former Research scientist (life sciences)  . Smokeless tobacco: Former Systems developer  . Tobacco comment: quit 28 yrs ago  Substance and Sexual Activity  . Alcohol use: No  . Drug use: No  . Sexual activity: Never  Other Topics Concern  . Not on file  Social History Narrative   epworth sleepiness scale = 10 (11/15/15)    Lives alone in a 2 story home.  Has 3 children.  Currently not working.  Has lost 6 jobs in the past 2 months due to her leg numbness and pain.     Education: 2 years of college.   Social Determinants of Health   Financial Resource Strain:   . Difficulty of Paying Living Expenses: Not on file  Food Insecurity:   . Worried About Charity fundraiser in the Last Year: Not on file  . Ran Out of Food in the Last Year: Not on file  Transportation Needs:   . Lack of Transportation (Medical): Not on file  . Lack of Transportation (Non-Medical): Not on file  Physical Activity:   . Days of Exercise per Week: Not on file  . Minutes of Exercise per Session: Not on file  Stress:   . Feeling of Stress : Not on file  Social Connections:   .  Frequency of Communication with Friends and Family: Not on file  . Frequency of Social Gatherings with Friends and Family: Not on file  . Attends Religious Services: Not on file  . Active Member of Clubs or Organizations: Not on file  . Attends Archivist Meetings: Not on file  . Marital Status: Not on file  Intimate Partner Violence:   . Fear of Current or Ex-Partner: Not on file  . Emotionally Abused: Not on file  . Physically Abused: Not on file  . Sexually Abused: Not on file     Physical Exam   Vitals:   09/13/19 2209  BP: 135/81  Pulse: 83  Resp: 18  Temp: 97.7 F (36.5 C)  SpO2: 99%    CONSTITUTIONAL: Well-appearing, NAD NEURO:  Alert and oriented x 3, no focal deficits EYES:  eyes equal and reactive ENT/NECK:  no LAD, no JVD CARDIO: Regular rate,  well-perfused, normal S1 and S2 PULM:  CTAB no wheezing or rhonchi GI/GU:  normal bowel sounds, non-distended, non-tender MSK/SPINE:  No gross deformities, no edema SKIN:  no rash, atraumatic PSYCH:  Appropriate speech and behavior  *Additional and/or pertinent findings included in MDM below  Diagnostic and Interventional Summary    EKG Interpretation  Date/Time:  Saturday September 13 2019 [id number] EST Ventricular Rate:  84 PR Interval:  132 QRS Duration: 72 QT Interval:  384 QTC Calculation: 453 R Axis:   60 Text Interpretation: Sinus rhythm with sinus arrhythmia with occasional Premature ventricular complexes Otherwise normal ECG No significant change was found Confirmed by Gerlene Fee [phone]) on 09/13/2019 11:33:37 PM      Labs Reviewed  BASIC METABOLIC PANEL - Abnormal; Notable for the following components:      Result Value   Glucose, Bld 110 (*)    Calcium 8.8 (*)    All other components within normal limits  CBC  TROPONIN I (HIGH SENSITIVITY)    DG Chest 2 View  Final Result      Medications  sodium chloride flush (NS) 0.9 % injection 3 mL (has no administration in time range)     Procedures  /  Critical Care Procedures  ED Course and Medical Decision Making  I have reviewed the triage vital signs, the nursing notes, and pertinent available records from the EMR.  Pertinent labs & imaging results that were available during my care of the patient were reviewed by me and considered in my medical decision making (see below for details).     A few years of intermittent chest pain in this 59 year old female with history of hypertension, hyperlipidemia.  She has had stress testing fairly recently, low risk.  Her pain is atypical, her EKG is very reassuring, her troponin is negative.  Doubt cardiac etiology, no evidence of DVT, no tachycardia, no hypoxia, no shortness of breath, doubt PE.  Appropriate for discharge.    Barth Kirks. Sedonia Small, [place] [email or web address]  Final Clinical Impressions(s) / ED Diagnoses     ICD-10-CM   1. Chest pain, unspecified type  R07.9     ED Discharge Orders         Ordered    sucralfate (CARAFATE) 1 g tablet  4 times daily PRN     09/13/19 2342           Discharge Instructions Discussed with and Provided to Patient:     Discharge Instructions     You were evaluated in the Emergency Department  and after careful evaluation, we did not find any emergent condition requiring admission or further testing in the hospital.  Your exam/testing today was overall reassuring.  Please return to the Emergency Department if you experience any worsening of your condition.  We encourage you to follow up with a primary care provider.  Thank you for allowing [place] to be a part of your care.       Maudie Flakes, MD 09/13/19 [phone]

## 2019-09-13 NOTE — Discharge Instructions (Signed)
You were evaluated in the Emergency Department and after careful evaluation, we did not find any emergent condition requiring admission or further testing in the hospital. ° °Your exam/testing today was overall reassuring. ° °Please return to the Emergency Department if you experience any worsening of your condition.  We encourage you to follow up with a primary care provider.  Thank you for allowing us to be a part of your care. ° °

## 2019-09-13 NOTE — ED Triage Notes (Signed)
The pt is c/o chest pain for 2 days with some difficulty breathing  Also c/o a headache no apparent son at present

## 2019-09-19 ENCOUNTER — Encounter: Payer: Self-pay | Admitting: Physical Medicine and Rehabilitation

## 2019-09-19 ENCOUNTER — Other Ambulatory Visit: Payer: Self-pay

## 2019-09-19 ENCOUNTER — Encounter (HOSPITAL_BASED_OUTPATIENT_CLINIC_OR_DEPARTMENT_OTHER): Payer: No Typology Code available for payment source | Admitting: Physical Medicine and Rehabilitation

## 2019-09-19 VITALS — BP 146/82 | HR 74 | Temp 98.0°F | Ht 65.0 in | Wt 295.0 lb

## 2019-09-19 DIAGNOSIS — G894 Chronic pain syndrome: Secondary | ICD-10-CM

## 2019-09-19 DIAGNOSIS — Z6839 Body mass index (BMI) 39.0-39.9, adult: Secondary | ICD-10-CM

## 2019-09-19 DIAGNOSIS — E559 Vitamin D deficiency, unspecified: Secondary | ICD-10-CM | POA: Diagnosis not present

## 2019-09-19 DIAGNOSIS — M1712 Unilateral primary osteoarthritis, left knee: Secondary | ICD-10-CM | POA: Diagnosis not present

## 2019-09-19 DIAGNOSIS — Z79891 Long term (current) use of opiate analgesic: Secondary | ICD-10-CM

## 2019-09-19 MED ORDER — GABAPENTIN 300 MG PO CAPS: 300.0000 mg | ORAL_CAPSULE | Freq: Every day | ORAL | 1 refills | Status: DC

## 2019-09-19 MED ORDER — THERA VITAL M PO TABS: 1.0000 | ORAL_TABLET | Freq: Every day | ORAL | 0 refills | Status: DC

## 2019-09-19 MED ORDER — HYDROCODONE-ACETAMINOPHEN 10-325 MG PO TABS: 1.0000 | ORAL_TABLET | Freq: Three times a day (TID) | ORAL | 0 refills | Status: AC | PRN

## 2019-09-19 MED ORDER — ORLISTAT 60 MG PO CAPS: 60.0000 mg | ORAL_CAPSULE | Freq: Three times a day (TID) | ORAL | 0 refills | Status: DC

## 2019-09-19 NOTE — Progress Notes (Signed)
Subjective:    Patient ID: Kelsey Santana, female    DOB: 04-12-1961, 59 y.o.   MRN: [id number]  HPI  59 year old woman who presents for follow up of chronic knee pain.  Pain is much improved! She is starting to feel benefits of Synvisc injection. She has improved functionally and was able to tolerate walk in the park, which she is thrilled about. She walked with her friend who was a good motivator for her and made her walk enjoyable.   She followed with her orthopedist via [facility] appointment and was told that surgery is the most appropriate treatment for her left knee OA but that she is 40 pounds over weight limit. She says she cannot afford bariatric surgery at this time but has asked her [place] provider for a referral to bariatric surgery. In the meantime, we tried Synvisc which has provided excellent relief. We are continuing Hydrocodone and she feels she can try to wean down dose. Gabapentin also relaxes her but she ran out of this medication.   The hydrocodone has been helping with the pain and helping her to be more functional--enabling her to stand to cook, and more recently walk in the park. She has wanted to swim at the [facility] but has been unable to tolerate getting there due to her pain. She says she is willing to try any method of pain relief and would like to wean off the hydrocodone as soon as her pain is better controlled as she does not want to be addicted.   She is also interested in weight loss pills. She has been controlling her diet well but has been unable to engage in exercise or much movement due to her pain.    Pain Inventory Average Pain 10 Pain Right Now 6 My pain is intermittent, constant, sharp and burning  In the last 24 hours, has pain interfered with the following? General activity 0 Relation with others 0 Enjoyment of life 0 What TIME of day is your pain at its worst? all Sleep (in general) Fair  Pain is worse with: walking, bending, sitting and  inactivity Pain improves with: pacing activities, medication and injections Relief from Meds: 9  Mobility use a cane ability to climb steps?  yes do you drive?  yes  Function I need assistance with the following:  household duties and shopping  Neuro/Psych bladder control problems trouble walking  Prior Studies Any changes since last visit?  no  Physicians involved in your care Any changes since last visit?  no   Family History  Problem Relation Age of Onset  . Alcohol abuse Mother   . Diabetes Mother   . Hyperlipidemia Mother   . Hypertension Mother   . Diabetes Brother   . Hyperlipidemia Brother   . Hypertension Brother   . Thyroid disease Neg Hx    Social History   Socioeconomic History  . Marital status: Single    Spouse name: Not on file  . Number of children: Not on file  . Years of education: Not on file  . Highest education level: Not on file  Occupational History  . Not on file  Tobacco Use  . Smoking status: Former Research scientist (life sciences)  . Smokeless tobacco: Former Systems developer  . Tobacco comment: quit 28 yrs ago  Substance and Sexual Activity  . Alcohol use: No  . Drug use: No  . Sexual activity: Never  Other Topics Concern  . Not on file  Social History Narrative  epworth sleepiness scale = 10 (11/15/15)    Lives alone in a 2 story home.  Has 3 children.  Currently not working.  Has lost 6 jobs in the past 2 months due to her leg numbness and pain.     Education: 2 years of college.   Social Determinants of Health   Financial Resource Strain:   . Difficulty of Paying Living Expenses: Not on file  Food Insecurity:   . Worried About Charity fundraiser in the Last Year: Not on file  . Ran Out of Food in the Last Year: Not on file  Transportation Needs:   . Lack of Transportation (Medical): Not on file  . Lack of Transportation (Non-Medical): Not on file  Physical Activity:   . Days of Exercise per Week: Not on file  . Minutes of Exercise per Session: Not on  file  Stress:   . Feeling of Stress : Not on file  Social Connections:   . Frequency of Communication with Friends and Family: Not on file  . Frequency of Social Gatherings with Friends and Family: Not on file  . Attends Religious Services: Not on file  . Active Member of Clubs or Organizations: Not on file  . Attends Archivist Meetings: Not on file  . Marital Status: Not on file   Past Surgical History:  Procedure Laterality Date  . ABDOMINAL HYSTERECTOMY     complete  . COLONOSCOPY N/A 01/16/2017   Procedure: COLONOSCOPY;  Surgeon: Carol Ada, MD;  Location: [facility];  Service: Endoscopy;  Laterality: N/A;  . ESOPHAGOGASTRODUODENOSCOPY N/A 01/16/2017   Procedure: ESOPHAGOGASTRODUODENOSCOPY (EGD);  Surgeon: Carol Ada, MD;  Location: [facility];  Service: Endoscopy;  Laterality: N/A;   Past Medical History:  Diagnosis Date  . 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 GERD (gastroesophageal reflux disease)   . Hypercholesteremia   . Hypertension   . Migraine   . Pre-diabetes    Temp 98 F (36.7 C)   Opioid Risk Score:   Fall Risk Score:  `1  Depression screen PHQ 2/9  Depression screen [facility] 2/9 07/09/2019 03/19/2012  Decreased Interest 0 0  Down, Depressed, Hopeless 0 0  PHQ - 2 Score 0 0     Review of Systems  Constitutional: Positive for diaphoresis and unexpected weight change.  HENT: Negative.   Eyes: Negative.   Respiratory: Negative.   Cardiovascular: Negative.   Gastrointestinal: Negative.   Endocrine: Negative.   Genitourinary: Positive for difficulty urinating.  Musculoskeletal: Positive for arthralgias and gait problem.  Skin: Negative.   Allergic/Immunologic: Negative.   Hematological: Negative.   Psychiatric/Behavioral: Negative.   All other systems reviewed and are negative.      Objective:   Physical Exam Gen: no distress, normal appearing, morbidly obese HEENT: oral mucosa pink and moist, NCAT Cardio: Reg rate Chest: normal  effort, normal rate of breathing Abd: soft, non-distended Ext: no edema Skin: intact Neuro/Musculoskeletal: AOx3. 5/5 strength in the bilateral upper extremities. She has 4/5 strength throughout RLE and 4-/5 throughout LLE, limited to pain. Normal gait.  Psych: pleasant, normal affect     Assessment & Plan:  59 year old woman who presents for follow up of chronic knee pain.  Left knee osteoarthritis --I will renew herHydrocodone. We agreed to decrease dose to 10mg  TID. I have also renewed her Gabapentin HS to help her sleep better without pain. Advised that she use Tylenol and Advil as needed to help wean  to Norco TID. Educated that opioids are not appropriate for chronic pain and the goal of this prescription will be to improve her pain and function until she can received surgery.  --Now starting to feel benefit from Synvisc! Able to walk in park with her friend. Encouraged daily walking with friend for physical, mental, and social benefits.   --Surgical candidate but was told she needs to lose 40 lbs first. Dicussed with her the benefits of bariatric surgery for improved function and pain relief. Right now ability to afford the procedure is a barrier for her. If she would be able to get this surgery through the [place], this would be ideal. Referral has been placed by her PCP.   --I have prescribed Orlistat for weight loss. Educated that she should take 3 times per day 1 hour after meals and can see results in 2 weeks. She should take multivitamin while taking this medication.   30 minutes of face to face patient care time were spent during this visit. All questions were encouraged and answered. RTC in 1 month.

## 2019-09-20 ENCOUNTER — Other Ambulatory Visit: Payer: Self-pay | Admitting: Physical Medicine and Rehabilitation

## 2019-09-20 DIAGNOSIS — E559 Vitamin D deficiency, unspecified: Secondary | ICD-10-CM

## 2019-09-20 LAB — VITAMIN D 25 HYDROXY (VIT D DEFICIENCY, FRACTURES): Vit D, 25-Hydroxy: 18 ng/mL — ABNORMAL LOW (ref 30.0–100.0)

## 2019-09-20 MED ORDER — VITAMIN D (ERGOCALCIFEROL) 1.25 MG (50000 UNIT) PO CAPS: 50000.0000 [IU] | ORAL_CAPSULE | ORAL | 0 refills | Status: DC

## 2019-10-11 ENCOUNTER — Ambulatory Visit (HOSPITAL_COMMUNITY)
Admission: EM | Admit: 2019-10-11 | Discharge: 2019-10-11 | Disposition: A | Discharge status: Home / Self Care | Payer: Self-pay

## 2019-10-11 ENCOUNTER — Other Ambulatory Visit: Payer: Self-pay

## 2019-10-11 ENCOUNTER — Encounter (HOSPITAL_COMMUNITY): Payer: Self-pay

## 2019-10-11 ENCOUNTER — Ambulatory Visit (INDEPENDENT_AMBULATORY_CARE_PROVIDER_SITE_OTHER): Payer: Self-pay

## 2019-10-11 DIAGNOSIS — M542 Cervicalgia: Secondary | ICD-10-CM

## 2019-10-11 DIAGNOSIS — R519 Headache, unspecified: Secondary | ICD-10-CM

## 2019-10-11 DIAGNOSIS — S161XXA Strain of muscle, fascia and tendon at neck level, initial encounter: Secondary | ICD-10-CM

## 2019-10-11 MED ORDER — CYCLOBENZAPRINE HCL 10 MG PO TABS: 10.0000 mg | ORAL_TABLET | Freq: Two times a day (BID) | ORAL | 0 refills | Status: DC | PRN
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
## 2019-10-17 ENCOUNTER — Encounter: Payer: No Typology Code available for payment source | Admitting: Physical Medicine and Rehabilitation

## 2019-10-29 ENCOUNTER — Other Ambulatory Visit: Payer: Self-pay

## 2019-10-29 ENCOUNTER — Encounter
Payer: No Typology Code available for payment source | Attending: Physical Medicine and Rehabilitation | Admitting: Physical Medicine and Rehabilitation

## 2019-10-29 ENCOUNTER — Encounter: Payer: Self-pay | Admitting: Physical Medicine and Rehabilitation

## 2019-10-29 VITALS — BP 137/78 | HR 60 | Temp 98.7°F | Ht 65.0 in | Wt 301.2 lb

## 2019-10-29 DIAGNOSIS — Z79891 Long term (current) use of opiate analgesic: Secondary | ICD-10-CM | POA: Diagnosis not present

## 2019-10-29 DIAGNOSIS — Z5181 Encounter for therapeutic drug level monitoring: Secondary | ICD-10-CM

## 2019-10-29 DIAGNOSIS — Z6839 Body mass index (BMI) 39.0-39.9, adult: Secondary | ICD-10-CM | POA: Diagnosis present

## 2019-10-29 DIAGNOSIS — M1712 Unilateral primary osteoarthritis, left knee: Secondary | ICD-10-CM

## 2019-10-29 DIAGNOSIS — G894 Chronic pain syndrome: Secondary | ICD-10-CM | POA: Diagnosis present

## 2019-10-29 DIAGNOSIS — E559 Vitamin D deficiency, unspecified: Secondary | ICD-10-CM

## 2019-10-29 MED ORDER — GABAPENTIN 300 MG PO CAPS: 300.0000 mg | ORAL_CAPSULE | Freq: Every day | ORAL | 1 refills | Status: DC

## 2019-10-29 MED ORDER — HYDROCODONE-ACETAMINOPHEN 10-325 MG PO TABS: 1.0000 | ORAL_TABLET | Freq: Three times a day (TID) | ORAL | 0 refills | Status: DC | PRN

## 2019-10-29 NOTE — Progress Notes (Signed)
Subjective:    Patient ID: Kelsey Santana, female    DOB: 1960-09-29, 59 y.o.   MRN: [id number]  HPI  Kelsey Santana presents for follow-up of her bilateral knee osteoarthritis.  She continues to feel some benefit from Synvisc injection into her left knee, but continues to have severe left knee pain and now right knee is more painful as well due to increased weightbearing on this knee. She has not been as active due to the cold weather outside and her knee pain. She has been taking Hydrocodone-Tylenol 10mg -325mg  3 times per day which allows her to be more functional and walk around her home.   She has been taking the Vitamin D supplement weekly and has been feeling better taking it.  She did not take the Orlistat as she said it was over-the-counter and too expensive. She was also worried about the side effects. She has been drinking more soft drinks and eating deserts with friends this week and has gained a few pounds, currently 301 in office. She feels she can start walking with her friend on track once again, cut sugar form her diet (she has successfully in the past and lost a lot of weight with this) and start going to the [facility] again.   Pain Inventory Average Pain 9 Pain Right Now 5 My pain is constant, sharp, stabbing and aching  In the last 24 hours, has pain interfered with the following? General activity 4 Relation with others 4 Enjoyment of life 4 What TIME of day is your pain at its worst? morning and night Sleep (in general) Poor  Pain is worse with: walking, bending, sitting, inactivity, standing and some activites Pain improves with: heat/ice, pacing activities and medication Relief from Meds: 10  Mobility use a cane use a walker how many minutes can you walk? 5 ability to climb steps?  yes do you drive?  yes  Function not employed: date last employed . I need assistance with the following:  meal prep, household duties and shopping  Neuro/Psych trouble  walking  Prior Studies Any changes since last visit?  no  Physicians involved in your care Any changes since last visit?  no   Family History  Problem Relation Age of Onset  . Alcohol abuse Mother   . Diabetes Mother   . Hyperlipidemia Mother   . Hypertension Mother   . Diabetes Brother   . Hyperlipidemia Brother   . Hypertension Brother   . Thyroid disease Neg Hx    Social History   Socioeconomic History  . Marital status: Single    Spouse name: Not on file  . Number of children: Not on file  . Years of education: Not on file  . Highest education level: Not on file  Occupational History  . Not on file  Tobacco Use  . Smoking status: Former Research scientist (life sciences)  . Smokeless tobacco: Former Systems developer  . Tobacco comment: quit 28 yrs ago  Substance and Sexual Activity  . Alcohol use: No  . Drug use: No  . Sexual activity: Never  Other Topics Concern  . Not on file  Social History Narrative   epworth sleepiness scale = 10 (11/15/15)    Lives alone in a 2 story home.  Has 3 children.  Currently not working.  Has lost 6 jobs in the past 2 months due to her leg numbness and pain.     Education: 2 years of college.   Social Determinants of Health   Financial Resource Strain:   .  Difficulty of Paying Living Expenses: Not on file  Food Insecurity:   . Worried About Charity fundraiser in the Last Year: Not on file  . Ran Out of Food in the Last Year: Not on file  Transportation Needs:   . Lack of Transportation (Medical): Not on file  . Lack of Transportation (Non-Medical): Not on file  Physical Activity:   . Days of Exercise per Week: Not on file  . Minutes of Exercise per Session: Not on file  Stress:   . Feeling of Stress : Not on file  Social Connections:   . Frequency of Communication with Friends and Family: Not on file  . Frequency of Social Gatherings with Friends and Family: Not on file  . Attends Religious Services: Not on file  . Active Member of Clubs or Organizations:  Not on file  . Attends Archivist Meetings: Not on file  . Marital Status: Not on file   Past Surgical History:  Procedure Laterality Date  . ABDOMINAL HYSTERECTOMY     complete  . COLONOSCOPY N/A 01/16/2017   Procedure: COLONOSCOPY;  Surgeon: Carol Ada, MD;  Location: [facility];  Service: Endoscopy;  Laterality: N/A;  . ESOPHAGOGASTRODUODENOSCOPY N/A 01/16/2017   Procedure: ESOPHAGOGASTRODUODENOSCOPY (EGD);  Surgeon: Carol Ada, MD;  Location: [facility];  Service: Endoscopy;  Laterality: N/A;   Past Medical History:  Diagnosis Date  . 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 GERD (gastroesophageal reflux disease)   . Hypercholesteremia   . Hypertension   . Migraine   . Pre-diabetes    BP 137/78   Pulse 60   Temp 98.7 F (37.1 C)   Ht 5\' 5"  (1.651 m)   Wt (!) 301 lb 3.2 oz (136.6 kg)   SpO2 98%   BMI 50.12 kg/m   Opioid Risk Score:   Fall Risk Score:  `1  Depression screen PHQ 2/9  Depression screen [facility] 2/9 10/29/2019 07/09/2019 03/19/2012  Decreased Interest 0 0 0  Down, Depressed, Hopeless 1 0 0  PHQ - 2 Score 1 0 0    Review of Systems  Musculoskeletal: Positive for gait problem.  All other systems reviewed and are negative.      Objective:   Physical Exam Physical Exam Gen: no distress, normal appearing, morbidly obese HEENT: oral mucosa pink and moist, NCAT Cardio: Reg rate Chest: normal effort, normal rate of breathing Abd: soft, non-distended Ext: no edema Skin: intact Neuro/Musculoskeletal:AOx3. 5/5 strength in the bilateral upper extremities. She has 4/5 strength throughout RLE and 4-/5 throughout LLE, limited to pain. Normal gait. Tender to palpation in bilateral knees.  Psych: pleasant, normal affect       Assessment & Plan:  59 year old woman who presents for follow up of bilateral chronic knee pain (L>R)  Left knee osteoarthritis --I will renew herHydrocodone. We agreed to decrease dose to 10mg  TID. I have also renewed her  Gabapentin HS to help her sleep better without pain. Advised that she use Tylenol and Advil as needed to help continue Norco TID. Educated that opioids are not appropriate for chronic pain and the goal of this prescription will be to improve her pain and function until she can received surgery.  --Continuing to benefit from Synvisc. Encouraged daily walking with friend for physical, mental, and social benefits. She plans to ask her friend to walk on track with her. Advised that she keep daily log of her exercises and bring to me at  next appointment to review. [facility]   --Surgical candidate but was told she needs to lose 40 lbs first. Dicussed with her the benefits of bariatric surgery for improved function and pain relief. Right now ability to afford the procedure is a barrier for her. If she would be able to get this surgery through the [place], this would be ideal. Referral has been placed by her PCP.   --She chose not to take the Orlistat. Educated regarding minimizing sugar in diet and she is motivated to make changes. Current weight is 301 lbs; will continue to monitor.   All questions were encouraged and answered. RTC in 1 month.

## 2019-11-05 ENCOUNTER — Telehealth: Payer: Self-pay | Admitting: Internal Medicine

## 2019-11-05 NOTE — Telephone Encounter (Signed)
11/05/19: Number on file is not a working number AF

## 2019-11-25 ENCOUNTER — Other Ambulatory Visit: Payer: Self-pay

## 2019-11-25 ENCOUNTER — Encounter
Payer: No Typology Code available for payment source | Attending: Physical Medicine and Rehabilitation | Admitting: Physical Medicine and Rehabilitation

## 2019-11-25 VITALS — BP 152/89 | HR 61 | Temp 97.9°F | Ht 65.0 in | Wt 295.0 lb

## 2019-11-25 DIAGNOSIS — Z79891 Long term (current) use of opiate analgesic: Secondary | ICD-10-CM | POA: Diagnosis present

## 2019-11-25 DIAGNOSIS — G894 Chronic pain syndrome: Secondary | ICD-10-CM

## 2019-11-25 DIAGNOSIS — M1712 Unilateral primary osteoarthritis, left knee: Secondary | ICD-10-CM | POA: Diagnosis not present

## 2019-11-25 DIAGNOSIS — Z5181 Encounter for therapeutic drug level monitoring: Secondary | ICD-10-CM

## 2019-11-25 DIAGNOSIS — E559 Vitamin D deficiency, unspecified: Secondary | ICD-10-CM | POA: Diagnosis present

## 2019-11-25 DIAGNOSIS — Z6839 Body mass index (BMI) 39.0-39.9, adult: Secondary | ICD-10-CM | POA: Insufficient documentation

## 2019-11-25 MED ORDER — HYDROCODONE-ACETAMINOPHEN 10-325 MG PO TABS: 1.0000 | ORAL_TABLET | Freq: Three times a day (TID) | ORAL | 0 refills | Status: DC | PRN

## 2019-11-25 NOTE — Progress Notes (Signed)
Subjective:    Patient ID: Kelsey Santana, female    DOB: Oct 08, 1960, 59 y.o.   MRN: [id number]  HPI  [name] presents for follow-up of her bilateral knee osteoarthritis, left >right.   She recently has had an exacerbation of her right knee pain. She feels she has been trying to offload her left knee and as a result putting more force on her right knee. She has been using a cane to ambulate.   She has tried to be more active recently, walking the park and track and using the treadmill at the [facility]. She has lost 5 pounds since last visit! Current weight is 295 lbs. She has been taking Hydrocodone-Tylenol 10mg -325mg  three times per day which allows her to be more functional and walk around her home. Given her recent exacerbation, she has at times taken four tablets per day.   She has been taking the Vitamin D supplement weekly and has been feeling better taking it.  She is interested in aquatic therapy and a referral for knee surgery outside the [place].    Pain Inventory Average Pain 7 Pain Right Now 8 My pain is constant  In the last 24 hours, has pain interfered with the following? General activity 2 Relation with others 5 Enjoyment of life 5 What TIME of day is your pain at its worst? morning, night Sleep (in general) NA  Pain is worse with: walking, bending, standing and some activites Pain improves with: medication and injections Relief from Meds: 10  Mobility walk with assistance use a cane do you drive?  yes Do you have any goals in this area?  yes  Function not employed: date last employed .  Neuro/Psych trouble walking  Prior Studies Any changes since last visit?  no  Physicians involved in your care Any changes since last visit?  no   Family History  Problem Relation Age of Onset  . Alcohol abuse Mother   . Diabetes Mother   . Hyperlipidemia Mother   . Hypertension Mother   . Diabetes Brother   . Hyperlipidemia Brother   . Hypertension Brother    . Thyroid disease Neg Hx    Social History   Socioeconomic History  . Marital status: Single    Spouse name: Not on file  . Number of children: Not on file  . Years of education: Not on file  . Highest education level: Not on file  Occupational History  . Not on file  Tobacco Use  . Smoking status: Former Research scientist (life sciences)  . Smokeless tobacco: Former Systems developer  . Tobacco comment: quit 28 yrs ago  Substance and Sexual Activity  . Alcohol use: No  . Drug use: No  . Sexual activity: Never  Other Topics Concern  . Not on file  Social History Narrative   epworth sleepiness scale = 10 (11/15/15)    Lives alone in a 2 story home.  Has 3 children.  Currently not working.  Has lost 6 jobs in the past 2 months due to her leg numbness and pain.     Education: 2 years of college.   Social Determinants of Health   Financial Resource Strain:   . Difficulty of Paying Living Expenses:   Food Insecurity:   . Worried About Charity fundraiser in the Last Year:   . Arboriculturist in the Last Year:   Transportation Needs:   . Film/video editor (Medical):   Marland Kitchen Lack of Transportation (Non-Medical):  Physical Activity:   . Days of Exercise per Week:   . Minutes of Exercise per Session:   Stress:   . Feeling of Stress :   Social Connections:   . Frequency of Communication with Friends and Family:   . Frequency of Social Gatherings with Friends and Family:   . Attends Religious Services:   . Active Member of Clubs or Organizations:   . Attends Archivist Meetings:   Marland Kitchen Marital Status:    Past Surgical History:  Procedure Laterality Date  . ABDOMINAL HYSTERECTOMY     complete  . COLONOSCOPY N/A 01/16/2017   Procedure: COLONOSCOPY;  Surgeon: Carol Ada, MD;  Location: [facility];  Service: Endoscopy;  Laterality: N/A;  . ESOPHAGOGASTRODUODENOSCOPY N/A 01/16/2017   Procedure: ESOPHAGOGASTRODUODENOSCOPY (EGD);  Surgeon: Carol Ada, MD;  Location: [facility];  Service: Endoscopy;   Laterality: N/A;   Past Medical History:  Diagnosis Date  . 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 GERD (gastroesophageal reflux disease)   . Hypercholesteremia   . Hypertension   . Migraine   . Pre-diabetes    Temp 97.9 F (36.6 C)   Ht 5\' 5"  (1.651 m)   Wt 295 lb (133.8 kg)   BMI 49.09 kg/m   Opioid Risk Score:   Fall Risk Score:  `1  Depression screen PHQ 2/9  Depression screen [facility] 2/9 10/29/2019 07/09/2019 03/19/2012  Decreased Interest 0 0 0  Down, Depressed, Hopeless 1 0 0  PHQ - 2 Score 1 0 0    Review of Systems  Constitutional: Negative.   HENT: Negative.   Eyes: Negative.   Respiratory: Negative.   Cardiovascular: Negative.   Gastrointestinal: Negative.   Endocrine: Negative.   Genitourinary: Negative.   Musculoskeletal: Positive for arthralgias, back pain and gait problem.  Skin: Negative.   Allergic/Immunologic: Negative.   Hematological: Negative.   Psychiatric/Behavioral: Negative.   All other systems reviewed and are negative.      Objective:   Physical Exam Gen: no distress, normal appearing,morbidlyobese HEENT: oral mucosa pink and moist, NCAT Cardio: Reg rate Chest: normal effort, normal rate of breathing Abd: soft, non-distended Ext: no edema Skin: intact Neuro/Musculoskeletal:AOx3. 5/5 strength in the bilateral upper extremities. She has 4/5 strength throughout RLE and4-/5 throughout LLE, limited to pain.Normal gait.Tender to palpation in bilateral knees. Gait: Antalgic, requiring cane.   Psych: pleasant, normal affect    Assessment & Plan:  59 year old woman who presents for follow up of bilateral chronic knee pain (L>R)  Left knee osteoarthritis --I will renew herHydrocodone. Maintain dose at 10mg  TID. Does not require Gabapentin refill at this time. Advised that she use Tylenol and Advil as needed to help continue Norco TID.Educated that opioids are not appropriate for chronic pain and the goal of this prescription will be to  improve her pain and function until she can received surgery. --Provided orthopedic referral for surgery outside the [place].  --Continue working out at [employer]. Advised against treadmill which is hard on the knees. Recommended exercise bike and aquatic therapy. Provided referral for aquatic therapy.   --She chose not to take the Orlistat. Educated regarding minimizing sugar in diet and she is motivated to make changes. Current weight is 295 lbs; encouraged her on her 6 lb weight loss since last visit one month ago!  All questions were encouraged and answered. RTC in 1 month.

## 2019-11-26 ENCOUNTER — Ambulatory Visit: Payer: No Typology Code available for payment source | Admitting: Physical Medicine and Rehabilitation

## 2019-11-26 ENCOUNTER — Encounter: Payer: Self-pay | Admitting: Physical Medicine and Rehabilitation

## 2019-11-28 LAB — DRUG TOX MONITOR 1 W/CONF, ORAL FLD

## 2019-11-28 LAB — DRUG TOX ALC METAB W/CON, ORAL FLD: Alcohol Metabolite: NEGATIVE ng/mL (ref <25)

## 2019-12-01 ENCOUNTER — Telehealth: Payer: Self-pay

## 2019-12-01 NOTE — Telephone Encounter (Signed)
Drug Screen irregularity - per PMP last filled 10/29/19   #90 -- patient states on medication self inventory - "took last does 04.05.21"- no container for visit pill count-- Oral Swab shows no RX in specimen

## 2019-12-02 ENCOUNTER — Encounter: Payer: No Typology Code available for payment source | Admitting: Physician Assistant

## 2019-12-02 NOTE — Progress Notes (Signed)
This encounter was created in error - please disregard.

## 2019-12-10 NOTE — Progress Notes (Signed)
This encounter was created in error - please disregard.

## 2019-12-23 ENCOUNTER — Other Ambulatory Visit: Payer: Self-pay | Admitting: Physical Medicine and Rehabilitation

## 2019-12-23 ENCOUNTER — Telehealth: Payer: Self-pay

## 2019-12-23 MED ORDER — HYDROCODONE-ACETAMINOPHEN 10-325 MG PO TABS: 1.0000 | ORAL_TABLET | Freq: Three times a day (TID) | ORAL | 0 refills | Status: DC | PRN

## 2019-12-23 NOTE — Telephone Encounter (Signed)
Hi Kelsey Santana, I will put in the refill for her this evening when I am back at my computer, can you please let it know that it should be available for her to pick up tomorrow?

## 2019-12-23 NOTE — Telephone Encounter (Signed)
Patient notified

## 2019-12-23 NOTE — Telephone Encounter (Signed)
Patient called for a refill request Hydrocodone/APAP next appt 12/30/2019, last filled 11/25/2019.

## 2019-12-28 ENCOUNTER — Emergency Department (HOSPITAL_COMMUNITY): Payer: No Typology Code available for payment source

## 2019-12-28 ENCOUNTER — Encounter (HOSPITAL_COMMUNITY): Payer: Self-pay | Admitting: Emergency Medicine

## 2019-12-28 ENCOUNTER — Emergency Department (HOSPITAL_COMMUNITY)
Admission: EM | Admit: 2019-12-28 | Discharge: 2019-12-28 | Disposition: A | Discharge status: Home / Self Care | Payer: No Typology Code available for payment source | Attending: Emergency Medicine | Admitting: Emergency Medicine

## 2019-12-28 ENCOUNTER — Other Ambulatory Visit: Payer: Self-pay

## 2019-12-28 DIAGNOSIS — Z87891 Personal history of nicotine dependence: Secondary | ICD-10-CM | POA: Insufficient documentation

## 2019-12-28 DIAGNOSIS — R079 Chest pain, unspecified: Secondary | ICD-10-CM | POA: Diagnosis not present

## 2019-12-28 DIAGNOSIS — Z79899 Other long term (current) drug therapy: Secondary | ICD-10-CM | POA: Diagnosis not present

## 2019-12-28 DIAGNOSIS — I10 Essential (primary) hypertension: Secondary | ICD-10-CM | POA: Insufficient documentation

## 2019-12-28 LAB — TROPONIN I (HIGH SENSITIVITY)
Troponin I (High Sensitivity): 7 ng/L (ref <18)
Troponin I (High Sensitivity): 7 ng/L (ref <18)

## 2019-12-28 LAB — BASIC METABOLIC PANEL
Anion gap: 10 (ref 5–15)
BUN: 11 mg/dL (ref 6–20)
CO2: 30 mmol/L (ref 22–32)
Calcium: 9.4 mg/dL (ref 8.9–10.3)
Chloride: 103 mmol/L (ref 98–111)
Creatinine, Ser: 0.85 mg/dL (ref 0.44–1.00)
GFR calc Af Amer: >60 mL/min (ref >60)
GFR calc non Af Amer: >60 mL/min (ref >60)
Glucose, Bld: 98 mg/dL (ref 70–99)
Potassium: 3.8 mmol/L (ref 3.5–5.1)
Sodium: 143 mmol/L (ref 135–145)

## 2019-12-28 LAB — CBC
HCT: 43.6 % (ref 36.0–46.0)
Hemoglobin: 13.8 g/dL (ref 12.0–15.0)
MCH: 28 pg (ref 26.0–34.0)
MCHC: 31.7 g/dL (ref 30.0–36.0)
MCV: 88.4 fL (ref 80.0–100.0)
Platelets: 250 10*3/uL (ref 150–400)
RBC: 4.93 MIL/uL (ref 3.87–5.11)
RDW: 13.3 % (ref 11.5–15.5)
WBC: 7.2 10*3/uL (ref 4.0–10.5)
nRBC: 0 % (ref 0.0–0.2)

## 2019-12-28 LAB — I-STAT BETA HCG BLOOD, ED (MC, WL, AP ONLY): I-stat hCG, quantitative: <5 m[IU]/mL (ref <5)

## 2019-12-28 MED ORDER — ASPIRIN 81 MG PO CHEW
324.0000 mg | CHEWABLE_TABLET | Freq: Once | ORAL | Status: AC
  Administered 2019-12-28: 324 mg via ORAL
  Filled 2019-12-28: qty 4

## 2019-12-28 MED ORDER — MORPHINE SULFATE (PF) 4 MG/ML IV SOLN
4.0000 mg | Freq: Once | INTRAVENOUS | Status: AC
  Administered 2019-12-28: 4 mg via INTRAVENOUS
  Filled 2019-12-28: qty 1

## 2019-12-28 MED ORDER — SODIUM CHLORIDE 0.9% FLUSH: 3.0000 mL | Freq: Once | INTRAVENOUS | Status: DC

## 2019-12-28 NOTE — Discharge Instructions (Addendum)
Seen in the emergency department for chest pain that radiated into your right shoulder neck and arm.  You had blood work chest x-ray EKG that did not show any evidence of any serious findings.  It will be important for you to follow-up with your doctors in the [place].  Please return to the emergency department if any worsening or concerning symptoms.

## 2019-12-28 NOTE — ED Provider Notes (Signed)
[facility] EMERGENCY DEPARTMENT Provider Note   CSN: [id number] Arrival date & time: 12/28/19  [id number]     History Chief Complaint  Patient presents with  . Chest Pain    Kelsey Santana is a 59 y.o. female.  She said history of hypertension hypercholesterolemia GERD.  She has had on and off chest pain for years and nobody is been able to figure it out.  She had another attack last week and then again today.  Across her whole chest pressure with pain in her right shoulder right side of her neck into her head.  Associated with some nausea and shortness of breath.  She said it happened at rest while she was at church.  She tried nothing for it.  She has follow-up with cardiology  The history is provided by the patient.  Chest Pain Pain location:  L chest and R chest Pain quality: pressure   Pain radiates to:  Neck and R shoulder Pain severity:  Moderate Onset quality:  Sudden Timing:  Intermittent Progression:  Improving Chronicity:  Recurrent Context: at rest   Relieved by:  None tried Worsened by:  Nothing Ineffective treatments:  None tried Associated symptoms: back pain, dizziness, nausea and shortness of breath   Associated symptoms: no abdominal pain, no cough, no diaphoresis, no fever and no vomiting   Risk factors: high cholesterol and hypertension   Risk factors: no coronary artery disease and no diabetes mellitus        Past Medical History:  Diagnosis Date  . 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 GERD (gastroesophageal reflux disease)   . Hypercholesteremia   . Hypertension   . Migraine   . Pre-diabetes     Patient Active Problem List   Diagnosis Date Noted  . Chest pain, musculoskeletal   . 'light-for-dates' infant with signs of fetal malnutrition 06/07/2017  . Hypocalcemia 06/07/2017  . Vitamin D deficiency 06/07/2017  . Chest pain 11/15/2015  . Depression 04/15/2012  . Morbid obesity due to excess calories ([place]) 04/15/2012  . Tinea  corporis 04/15/2012  . Fatigue 04/15/2012  . Epigastric pain 03/19/2012  . Migraine headache 03/11/2012  . GERD (gastroesophageal reflux disease) 03/11/2012  . HTN (hypertension) 03/11/2012  . HLD (hyperlipidemia) 03/11/2012    Past Surgical History:  Procedure Laterality Date  . ABDOMINAL HYSTERECTOMY     complete  . COLONOSCOPY N/A 01/16/2017   Procedure: COLONOSCOPY;  Surgeon: Carol Ada, MD;  Location: [facility];  Service: Endoscopy;  Laterality: N/A;  . ESOPHAGOGASTRODUODENOSCOPY N/A 01/16/2017   Procedure: ESOPHAGOGASTRODUODENOSCOPY (EGD);  Surgeon: Carol Ada, MD;  Location: [facility];  Service: Endoscopy;  Laterality: N/A;     OB History   No obstetric history on file.     Family History  Problem Relation Age of Onset  . Alcohol abuse Mother   . Diabetes Mother   . Hyperlipidemia Mother   . Hypertension Mother   . Diabetes Brother   . Hyperlipidemia Brother   . Hypertension Brother   . Thyroid disease Neg Hx     Social History   Tobacco Use  . Smoking status: Former Research scientist (life sciences)  . Smokeless tobacco: Former Systems developer  . Tobacco comment: quit 28 yrs ago  Substance Use Topics  . Alcohol use: No  . Drug use: No    Home Medications Prior to Admission medications   Medication Sig Start Date End Date Taking? Authorizing Provider  amLODipine (NORVASC) 5 MG tablet Take 1  tablet (5 mg total) by mouth at bedtime. 02/17/19   Dhungel, Flonnie Overman, MD  atorvastatin (LIPITOR) 20 MG tablet Take 1 tablet (20 mg total) by mouth at bedtime. 02/17/19   Dhungel, Flonnie Overman, MD  cyclobenzaprine (FLEXERIL) 10 MG tablet Take 1 tablet (10 mg total) by mouth 2 (two) times daily as needed for muscle spasms. 10/11/19   Faustino Congress, NP  dihydroergotamine (MIGRANAL) 4 MG/ML nasal spray Place 1 spray into the nose as needed for migraine. Use in one nostril as directed.  No more than 4 sprays in one hour 07/24/18   Narda Amber K, DO  DULoxetine (CYMBALTA) 60 MG capsule Take 60 mg by  mouth daily.    [provider]  etodolac (LODINE) 300 MG capsule Take 1 capsule (300 mg total) by mouth every 8 (eight) hours. 06/03/19   Dorie Rank, MD  gabapentin (NEURONTIN) 300 MG capsule Take 1 capsule (300 mg total) by mouth at bedtime. 10/29/19   Raulkar, Clide Deutscher, MD  HYDROcodone-acetaminophen (NORCO) 10-325 MG tablet Take 1 tablet by mouth 3 (three) times daily as needed. 12/23/19   Raulkar, Clide Deutscher, MD  ibuprofen (ADVIL) 200 MG tablet Take 2 tablets (400 mg total) by mouth every 8 (eight) hours as needed for headache. 02/17/19   Dhungel, Nishant, MD  lidocaine (XYLOCAINE) 5 % ointment APPLY TO THIGH DAILY AS NEEDED FOR PAIN 02/17/19   Dhungel, Flonnie Overman, MD  Multiple Vitamins-Minerals (MULTIVITAMIN) tablet Take 1 tablet by mouth daily. 09/19/19   Raulkar, Clide Deutscher, MD  naproxen (NAPROSYN) 500 MG tablet Take 1 tablet (500 mg total) by mouth 2 (two) times daily. 10/19/17   Khatri, Hina, PA-C  nortriptyline (PAMELOR) 10 MG capsule Start 10mg  at bedtime for 2 weeks, then increase to 2 tablet at bedtime Patient taking differently: Take 20 mg by mouth at bedtime.  06/28/18   Patel, Arvin Collard K, DO  orlistat (ALLI) 60 MG capsule Take 1 capsule (60 mg total) by mouth 3 (three) times daily with meals. 09/19/19   Raulkar, Clide Deutscher, MD  pantoprazole (PROTONIX) 20 MG tablet Take 20 mg by mouth daily.    [provider]  predniSONE (DELTASONE) 20 MG tablet Take 60 mg by mouth daily. 06/12/19   [provider]  predniSONE (STERAPRED UNI-PAK 21 TAB) 10 MG (21) TBPK tablet Take by mouth daily. Take as directed. 05/27/19   Vanessa Kick, MD  sucralfate (CARAFATE) 1 g tablet Take 1 tablet (1 g total) by mouth 4 (four) times daily as needed. 09/13/19   Maudie Flakes, MD  SUMAtriptan (IMITREX) 50 MG tablet Take 1 tablet (50 mg total) by mouth every 2 (two) hours as needed for migraine. May repeat in 2 hours if headache persists or recurs. 05/14/17   Patel, Arvin Collard K, DO  topiramate (TOPAMAX) 15  MG capsule Take 15 mg by mouth 2 (two) times daily.    [provider]  Vitamin D, Ergocalciferol, (DRISDOL) 1.25 MG (50000 UNIT) CAPS capsule Take 1 capsule (50,000 Units total) by mouth every 7 (seven) days. 09/20/19   Izora Ribas, MD    Allergies    Ciprofloxacin, Hydrocortisone, and Ace inhibitors  Review of Systems   Review of Systems  Constitutional: Negative for diaphoresis and fever.  HENT: Negative for sore throat.   Eyes: Negative for visual disturbance.  Respiratory: Positive for shortness of breath. Negative for cough.   Cardiovascular: Positive for chest pain.  Gastrointestinal: Positive for nausea. Negative for abdominal pain and vomiting.  Genitourinary: Negative for  dysuria.  Musculoskeletal: Positive for back pain.  Skin: Negative for rash.  Neurological: Positive for dizziness.    Physical Exam Updated Vital Signs BP 134/76 (BP Location: Right Arm)   Pulse 65   Temp 98.3 F (36.8 C) (Oral)   Resp 15   Ht 5\' 5"  (1.651 m)   Wt 133.4 kg   SpO2 98%   BMI 48.92 kg/m   Physical Exam Vitals and nursing note reviewed.  Constitutional:      General: She is not in acute distress.    Appearance: She is well-developed.  HENT:     Head: Normocephalic and atraumatic.  Eyes:     Conjunctiva/sclera: Conjunctivae normal.  Cardiovascular:     Rate and Rhythm: Normal rate and regular rhythm.     Heart sounds: Normal heart sounds. No murmur.  Pulmonary:     Effort: Pulmonary effort is normal. No respiratory distress.     Breath sounds: Normal breath sounds.  Abdominal:     Palpations: Abdomen is soft.     Tenderness: There is no abdominal tenderness.  Musculoskeletal:        General: Normal range of motion.     Cervical back: Neck supple.     Right lower leg: No tenderness. No edema.     Left lower leg: No tenderness. No edema.  Skin:    General: Skin is warm and dry.     Capillary Refill: Capillary refill takes less than 2 seconds.   Neurological:     General: No focal deficit present.     Mental Status: She is alert.     ED Results / Procedures / Treatments   Labs (all labs ordered are listed, but only abnormal results are displayed) Labs Reviewed  BASIC METABOLIC PANEL  CBC  I-STAT BETA HCG BLOOD, ED ([place], [facility], [facility] ONLY)  TROPONIN I (HIGH SENSITIVITY)  TROPONIN I (HIGH SENSITIVITY)    EKG EKG Interpretation  Date/Time:  Sunday Dec 28 2019 20:14:47 EDT Ventricular Rate:  72 PR Interval:  136 QRS Duration: 109 QT Interval:  387 QTC Calculation: 412 R Axis:   66 Text Interpretation: Sinus rhythm Nonspecific repol abnormality, diffuse leads Poor data quality Confirmed by Aletta Edouard [phone]) on 12/28/2019 8:19:05 PM   Radiology DG Chest 2 View  Result Date: 12/28/2019 CLINICAL DATA:  Chest pain. EXAM: CHEST - 2 VIEW COMPARISON:  September 13, 2019 FINDINGS: The heart size and mediastinal contours are within normal limits. Both lungs are clear. The visualized skeletal structures are unremarkable. IMPRESSION: No active cardiopulmonary disease. Electronically Signed   By: Virgina Norfolk M.D.   On: 12/28/2019 20:07    Procedures Procedures (including critical care time)  Medications Ordered in ED Medications  sodium chloride flush (NS) 0.9 % injection 3 mL (0 mLs Intravenous Hold 12/28/19 2008)  aspirin chewable tablet 324 mg (has no administration in time range)  morphine 4 MG/ML injection 4 mg (has no administration in time range)    ED Course  I have reviewed the triage vital signs and the nursing notes.  Pertinent labs & imaging results that were available during my care of the patient were reviewed by me and considered in my medical decision making (see chart for details).    MDM Rules/Calculators/A&P                     This patient complains of pain in the chest right arm neck head; this involves an extensive number of treatment Options and  is a complaint that carries with it a high risk of  complications and Morbidity. The differential includes ACS, musculoskeletal, PE, vascular, reflux or pneumothorax  I ordered, reviewed and interpreted labs, which included CBC and chemistries which are normal, delta troponin unchanged. I ordered medication aspirin and morphine with improvement in her symptoms I ordered imaging studies which included chest x-ray and I independently    visualized and interpreted imaging which showed no gross infiltrates Previous records obtained and reviewed in epic including multiple ED visits for nonspecific chest pain  After the interventions stated above, I reevaluated the patient and found the patient symptoms to be improved.  I reviewed her results with her.  She is working with the [facility] to try to get a cardiology follow-up.  Return instructions discussed.   Final Clinical Impression(s) / ED Diagnoses Final diagnoses:  Nonspecific chest pain    Rx / DC Orders ED Discharge Orders    None       Hayden Rasmussen, MD 12/29/19 1023

## 2019-12-28 NOTE — ED Triage Notes (Signed)
Pt brought to ED by [facility] from home for c/o 9/10 mid cp radiating to right side neck, shoulder and head for the past few days getting worse tonight/ BP 180/90, HR 90, SPO2 93%RA, CBG 130.

## 2019-12-28 NOTE — ED Notes (Signed)
Patient verbalizes understanding of discharge instructions. Opportunity for questioning and answers were provided. Armband removed by staff, pt discharged from ED in wheelchair to home.   

## 2019-12-30 ENCOUNTER — Encounter: Payer: Self-pay | Admitting: Physical Medicine and Rehabilitation

## 2019-12-30 ENCOUNTER — Encounter
Payer: No Typology Code available for payment source | Attending: Physical Medicine and Rehabilitation | Admitting: Physical Medicine and Rehabilitation

## 2019-12-30 ENCOUNTER — Other Ambulatory Visit: Payer: Self-pay

## 2019-12-30 VITALS — BP 115/77 | HR 76 | Temp 97.7°F | Ht 65.0 in | Wt 290.0 lb

## 2019-12-30 DIAGNOSIS — E559 Vitamin D deficiency, unspecified: Secondary | ICD-10-CM | POA: Insufficient documentation

## 2019-12-30 DIAGNOSIS — Z79891 Long term (current) use of opiate analgesic: Secondary | ICD-10-CM | POA: Insufficient documentation

## 2019-12-30 DIAGNOSIS — Z5181 Encounter for therapeutic drug level monitoring: Secondary | ICD-10-CM | POA: Diagnosis not present

## 2019-12-30 DIAGNOSIS — M1712 Unilateral primary osteoarthritis, left knee: Secondary | ICD-10-CM | POA: Diagnosis present

## 2019-12-30 DIAGNOSIS — G894 Chronic pain syndrome: Secondary | ICD-10-CM | POA: Diagnosis present

## 2019-12-30 DIAGNOSIS — Z6839 Body mass index (BMI) 39.0-39.9, adult: Secondary | ICD-10-CM | POA: Insufficient documentation

## 2019-12-30 MED ORDER — GLUCOSAMINE-CHONDROITIN 500-400 MG PO TABS: 1.0000 | ORAL_TABLET | Freq: Three times a day (TID) | ORAL | 3 refills | Status: DC

## 2019-12-30 MED ORDER — HYDROCODONE-ACETAMINOPHEN 10-325 MG PO TABS: 1.0000 | ORAL_TABLET | Freq: Four times a day (QID) | ORAL | 0 refills | Status: DC | PRN

## 2019-12-30 NOTE — Progress Notes (Signed)
Subjective:    Patient ID: Kelsey Santana, female    DOB: 06-06-1961, 59 y.o.   MRN: [id number]  HPI  [name] presents for follow-up of her bilateral knee osteoarthritis, left >right.   She continues to experience worsening of her right knee pain. She feels she has been trying to offload her left knee and as a result putting more force on her right knee. She has been using a cane to ambulate and her ambulation has been more limited recently. At times she has had to rest in bed most of the day. At other times she is able to be more active.   Previously she had tried to be more active, walking the park and track and using the treadmill at the [facility]. She has lost 10 pounds over the past 2 months! Current weight is 290lbs. She has been taking Hydrocodone-Tylenol 10mg -325mg  three times per day for her knee pain but feels pain has worsened and thus function has been limited over past month.    She has been taking the Vitamin D supplement weekly and has been feeling better taking it. Has recently completed course and would like to have her vitamin D level checked.   She may be interested in aquatic therapy once pain improves. I previously gave her a referral for knee surgery outside the [place] but she never heard back from them.     Pain Inventory Average Pain 10 Pain Right Now 10 My pain is sharp, stabbing and aching  In the last 24 hours, has pain interfered with the following? General activity 1 Relation with others 1 Enjoyment of life 0 What TIME of day is your pain at its worst? all Sleep (in general) Poor  Pain is worse with: walking, bending, inactivity, standing and some activites Pain improves with: medication and injections Relief from Meds: 8  Mobility walk with assistance use a cane ability to climb steps?  yes do you drive?  yes  Function not employed: date last employed . I need assistance with the following:  meal prep, household duties and shopping Do you have  any goals in this area?  yes  Neuro/Psych trouble walking  Prior Studies Any changes since last visit?  no  Physicians involved in your care Any changes since last visit?  no   Family History  Problem Relation Age of Onset  . Alcohol abuse Mother   . Diabetes Mother   . Hyperlipidemia Mother   . Hypertension Mother   . Diabetes Brother   . Hyperlipidemia Brother   . Hypertension Brother   . Thyroid disease Neg Hx    Social History   Socioeconomic History  . Marital status: Single    Spouse name: Not on file  . Number of children: Not on file  . Years of education: Not on file  . Highest education level: Not on file  Occupational History  . Not on file  Tobacco Use  . Smoking status: Former Research scientist (life sciences)  . Smokeless tobacco: Former Systems developer  . Tobacco comment: quit 28 yrs ago  Substance and Sexual Activity  . Alcohol use: No  . Drug use: No  . Sexual activity: Never  Other Topics Concern  . Not on file  Social History Narrative   epworth sleepiness scale = 10 (11/15/15)    Lives alone in a 2 story home.  Has 3 children.  Currently not working.  Has lost 6 jobs in the past 2 months due to her leg numbness and  pain.     Education: 2 years of college.   Social Determinants of Health   Financial Resource Strain:   . Difficulty of Paying Living Expenses:   Food Insecurity:   . Worried About Charity fundraiser in the Last Year:   . Arboriculturist in the Last Year:   Transportation Needs:   . Film/video editor (Medical):   Marland Kitchen Lack of Transportation (Non-Medical):   Physical Activity:   . Days of Exercise per Week:   . Minutes of Exercise per Session:   Stress:   . Feeling of Stress :   Social Connections:   . Frequency of Communication with Friends and Family:   . Frequency of Social Gatherings with Friends and Family:   . Attends Religious Services:   . Active Member of Clubs or Organizations:   . Attends Archivist Meetings:   Marland Kitchen Marital Status:     Past Surgical History:  Procedure Laterality Date  . ABDOMINAL HYSTERECTOMY     complete  . COLONOSCOPY N/A 01/16/2017   Procedure: COLONOSCOPY;  Surgeon: Carol Ada, MD;  Location: [facility];  Service: Endoscopy;  Laterality: N/A;  . ESOPHAGOGASTRODUODENOSCOPY N/A 01/16/2017   Procedure: ESOPHAGOGASTRODUODENOSCOPY (EGD);  Surgeon: Carol Ada, MD;  Location: [facility];  Service: Endoscopy;  Laterality: N/A;   Past Medical History:  Diagnosis Date  . 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 GERD (gastroesophageal reflux disease)   . Hypercholesteremia   . Hypertension   . Migraine   . Pre-diabetes    There were no vitals taken for this visit.  Opioid Risk Score:   Fall Risk Score:  `1  Depression screen PHQ 2/9  Depression screen [facility] 2/9 10/29/2019 07/09/2019 03/19/2012  Decreased Interest 0 0 0  Down, Depressed, Hopeless 1 0 0  PHQ - 2 Score 1 0 0    Review of Systems  Constitutional: Positive for appetite change.  HENT: Negative.   Eyes: Negative.   Respiratory: Negative.   Cardiovascular: Negative.   Gastrointestinal: Negative.   Endocrine: Negative.   Musculoskeletal: Positive for arthralgias and gait problem.  Allergic/Immunologic: Negative.   Hematological: Negative.   Psychiatric/Behavioral: Negative.   All other systems reviewed and are negative.      Objective:   Physical Exam Gen: no distress, normal appearing, morbidly obese HEENT: oral mucosa pink and moist, NCAT Cardio: Reg rate Chest: normal effort, normal rate of breathing Abd: soft, non-distended Ext: no edema Skin: intact Neuro/Musculoskeletal: AOx3. 5/5 strength in the bilateral upper extremities. She has 4/5 strength throughout RLE and 4-/5 throughout LLE, limited to pain. Normal gait. Tender to palpation in bilateral knees. Gait: Antalgic, requiring cane, slow cadence.  Psych: pleasant, normal affect    Assessment & Plan:  59 year old woman who presents for follow up of bilateral  chronic knee pain (L>R)   Left knee osteoarthritis --I will renew her Hydrocodone. Increase dose at 10mg  QID given increased pain. Does not require Gabapentin refill at this time- she is using sparingly at night as the combination with Norco makes her too groggy in the morning but she needs the Norco to sleep. Advised that she use Tylenol and Advil as needed to avoid increasing Norco dose further. Educated that opioids are not appropriate for chronic pain and the goal of this prescription will be to improve her pain and function until she can received surgery. --Provided orthopedic referral for surgery outside the [place] previously; she has not yet  heard for them.  --Continue working out at [employer]. Advised against treadmill which is hard on the knees. Recommended exercise bike and aquatic therapy. Provided referral for aquatic therapy previously.    Morbid obesity, BMI 48.26 --She chose not to take the Orlistat. Educated regarding minimizing sugar in diet and she is motivated to make changes. Current weight is 290 lbs; encouraged her on her 11 lb weight loss over the past 2 months!  Recent chest pain: -Cardiac workup negative, but advised that she does have cardiac risk factors and she should return to ED if she experiences similar symptoms.    All questions were encouraged and answered. RTC in 1 month.

## 2019-12-31 LAB — VITAMIN D 25 HYDROXY (VIT D DEFICIENCY, FRACTURES): Vit D, 25-Hydroxy: 31.2 ng/mL (ref 30.0–100.0)

## 2019-12-31 MED ORDER — VITAMIN D (ERGOCALCIFEROL) 1.25 MG (50000 UNIT) PO CAPS: 50000.0000 [IU] | ORAL_CAPSULE | ORAL | 0 refills | Status: DC

## 2019-12-31 NOTE — Addendum Note (Signed)
Addended by: Izora Ribas on: 12/31/2019 12:46 PM   Modules accepted: Orders

## 2020-01-04 LAB — TOXASSURE SELECT,+ANTIDEPR,UR

## 2020-01-08 ENCOUNTER — Emergency Department (HOSPITAL_COMMUNITY)
Admission: EM | Admit: 2020-01-08 | Discharge: 2020-01-09 | Disposition: A | Discharge status: Home / Self Care | Payer: No Typology Code available for payment source | Attending: Emergency Medicine | Admitting: Emergency Medicine

## 2020-01-08 ENCOUNTER — Other Ambulatory Visit: Payer: Self-pay

## 2020-01-08 ENCOUNTER — Emergency Department (HOSPITAL_COMMUNITY): Payer: No Typology Code available for payment source

## 2020-01-08 ENCOUNTER — Encounter (HOSPITAL_COMMUNITY): Payer: Self-pay | Admitting: *Deleted

## 2020-01-08 DIAGNOSIS — R1110 Vomiting, unspecified: Secondary | ICD-10-CM | POA: Diagnosis not present

## 2020-01-08 DIAGNOSIS — R0989 Other specified symptoms and signs involving the circulatory and respiratory systems: Secondary | ICD-10-CM | POA: Diagnosis not present

## 2020-01-08 DIAGNOSIS — Z79899 Other long term (current) drug therapy: Secondary | ICD-10-CM | POA: Diagnosis not present

## 2020-01-08 DIAGNOSIS — Z87891 Personal history of nicotine dependence: Secondary | ICD-10-CM | POA: Insufficient documentation

## 2020-01-08 DIAGNOSIS — I10 Essential (primary) hypertension: Secondary | ICD-10-CM | POA: Diagnosis not present

## 2020-01-08 NOTE — ED Triage Notes (Signed)
Pt says she was eating flounder and shrimp, says she got a fish bone stuck in her throat. She feels pain in the left side of her neck. She had vomiting x 1. No acute distress, she is handling secretions without difficulty, only c/o pain when she swallows.

## 2020-01-09 MED ORDER — SUCRALFATE 1 GM/10ML PO SUSP
1.0000 g | Freq: Once | ORAL | Status: AC
  Administered 2020-01-09: 1 g via ORAL
  Filled 2020-01-09: qty 10

## 2020-01-09 MED ORDER — SUCRALFATE 1 GM/10ML PO SUSP: 1.0000 g | Freq: Three times a day (TID) | ORAL | 0 refills | Status: DC

## 2020-01-09 MED ORDER — LIDOCAINE VISCOUS HCL 2 % MT SOLN
15.0000 mL | Freq: Once | OROMUCOSAL | Status: AC
  Administered 2020-01-09: 15 mL via ORAL
  Filled 2020-01-09: qty 15

## 2020-01-09 MED ORDER — ALUM & MAG HYDROXIDE-SIMETH 200-200-20 MG/5ML PO SUSP
30.0000 mL | Freq: Once | ORAL | Status: AC
  Administered 2020-01-09: 30 mL via ORAL
  Filled 2020-01-09: qty 30

## 2020-01-09 NOTE — ED Provider Notes (Signed)
[facility] EMERGENCY DEPARTMENT Provider Note   CSN: [id number] Arrival date & time: 01/08/20  2216     History Chief Complaint  Patient presents with  . Foreign Body    Kelsey Santana is a 59 y.o. female with a hx of chronic back pain, GERD, hypertension presents to the Emergency Department complaining of acute, persistent foreign body sensation in the left side of her throat.  Patient reports she was eating fish and shrimp around 4 PM when she felt the bone get stuck.  She reports that she vomited once when attempting to get it out.  She states she believes it has not moved.  She has been able to eat bread and drink Sprite without additional vomiting.  She reports she is handling her secretions without difficulty.  She denies previous foreign body.  No other treatments prior to arrival.  Swallowing makes the pain worse.  Nothing seems to make it better.   The history is provided by the patient and medical records. No language interpreter was used.       Past Medical History:  Diagnosis Date  . 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 GERD (gastroesophageal reflux disease)   . Hypercholesteremia   . Hypertension   . Migraine   . Pre-diabetes     Patient Active Problem List   Diagnosis Date Noted  . Chest pain, musculoskeletal   . 'light-for-dates' infant with signs of fetal malnutrition 06/07/2017  . Hypocalcemia 06/07/2017  . Vitamin D deficiency 06/07/2017  . Chest pain 11/15/2015  . Depression 04/15/2012  . Morbid obesity due to excess calories ([place]) 04/15/2012  . Tinea corporis 04/15/2012  . Fatigue 04/15/2012  . Epigastric pain 03/19/2012  . Migraine headache 03/11/2012  . GERD (gastroesophageal reflux disease) 03/11/2012  . HTN (hypertension) 03/11/2012  . HLD (hyperlipidemia) 03/11/2012    Past Surgical History:  Procedure Laterality Date  . ABDOMINAL HYSTERECTOMY     complete  . COLONOSCOPY N/A 01/16/2017   Procedure: COLONOSCOPY;  Surgeon:  Carol Ada, MD;  Location: [facility];  Service: Endoscopy;  Laterality: N/A;  . ESOPHAGOGASTRODUODENOSCOPY N/A 01/16/2017   Procedure: ESOPHAGOGASTRODUODENOSCOPY (EGD);  Surgeon: Carol Ada, MD;  Location: [facility];  Service: Endoscopy;  Laterality: N/A;     OB History   No obstetric history on file.     Family History  Problem Relation Age of Onset  . Alcohol abuse Mother   . Diabetes Mother   . Hyperlipidemia Mother   . Hypertension Mother   . Diabetes Brother   . Hyperlipidemia Brother   . Hypertension Brother   . Thyroid disease Neg Hx     Social History   Tobacco Use  . Smoking status: Former Research scientist (life sciences)  . Smokeless tobacco: Former Systems developer  . Tobacco comment: quit 28 yrs ago  Substance Use Topics  . Alcohol use: No  . Drug use: No    Home Medications Prior to Admission medications   Medication Sig Start Date End Date Taking? Authorizing Provider  amLODipine (NORVASC) 5 MG tablet Take 1 tablet (5 mg total) by mouth at bedtime. 02/17/19  Yes Dhungel, Nishant, MD  atorvastatin (LIPITOR) 20 MG tablet Take 1 tablet (20 mg total) by mouth at bedtime. 02/17/19  Yes Dhungel, Nishant, MD  etodolac (LODINE) 300 MG capsule Take 1 capsule (300 mg total) by mouth every 8 (eight) hours. Patient taking differently: Take 300 mg by mouth every 8 (eight) hours as needed for mild pain.  06/03/19  Yes Dorie Rank, MD  gabapentin (NEURONTIN) 300 MG capsule Take 1 capsule (300 mg total) by mouth at bedtime. Patient taking differently: Take 300 mg by mouth at bedtime as needed (for pain).  10/29/19  Yes Raulkar, Clide Deutscher, MD  HYDROcodone-acetaminophen (NORCO) 10-325 MG tablet Take 1 tablet by mouth 4 (four) times daily as needed. Patient taking differently: Take 1 tablet by mouth 4 (four) times daily as needed for moderate pain.  12/30/19  Yes Raulkar, Clide Deutscher, MD  lidocaine (XYLOCAINE) 5 % ointment APPLY TO THIGH DAILY AS NEEDED FOR PAIN Patient taking differently: Apply 1 application  topically See admin instructions. APPLY TO THIGH DAILY AS NEEDED FOR PAIN 02/17/19  Yes Dhungel, Nishant, MD  Multiple Vitamins-Minerals (MULTIVITAMIN) tablet Take 1 tablet by mouth daily. 09/19/19  Yes Raulkar, Clide Deutscher, MD  pantoprazole (PROTONIX) 20 MG tablet Take 20 mg by mouth daily.   Yes [provider]  SUMAtriptan (IMITREX) 50 MG tablet Take 1 tablet (50 mg total) by mouth every 2 (two) hours as needed for migraine. May repeat in 2 hours if headache persists or recurs. 05/14/17  Yes Patel, Donika K, DO  Vitamin D, Ergocalciferol, (DRISDOL) 1.25 MG (50000 UNIT) CAPS capsule Take 1 capsule (50,000 Units total) by mouth every 7 (seven) days. 12/31/19  Yes Raulkar, Clide Deutscher, MD  cyclobenzaprine (FLEXERIL) 10 MG tablet Take 1 tablet (10 mg total) by mouth 2 (two) times daily as needed for muscle spasms. Patient not taking: Reported on 12/30/2019 10/11/19   Faustino Congress, NP  dihydroergotamine (MIGRANAL) 4 MG/ML nasal spray Place 1 spray into the nose as needed for migraine. Use in one nostril as directed.  No more than 4 sprays in one hour Patient not taking: Reported on 12/30/2019 07/24/18   Narda Amber K, DO  glucosamine-chondroitin (MAX GLUCOSAMINE CHONDROITIN) 500-400 MG tablet Take 1 tablet by mouth 3 (three) times daily. 12/30/19   Raulkar, Clide Deutscher, MD  ibuprofen (ADVIL) 200 MG tablet Take 2 tablets (400 mg total) by mouth every 8 (eight) hours as needed for headache. Patient not taking: Reported on 01/09/2020 02/17/19   Dhungel, Flonnie Overman, MD  naproxen (NAPROSYN) 500 MG tablet Take 1 tablet (500 mg total) by mouth 2 (two) times daily. Patient not taking: Reported on 12/30/2019 10/19/17   Delia Heady, PA-C  nortriptyline (PAMELOR) 10 MG capsule Start 10mg  at bedtime for 2 weeks, then increase to 2 tablet at bedtime Patient not taking: Reported on 01/09/2020 06/28/18   Narda Amber K, DO  orlistat (ALLI) 60 MG capsule Take 1 capsule (60 mg total) by mouth 3 (three) times daily with  meals. Patient not taking: Reported on 01/09/2020 09/19/19   Izora Ribas, MD  predniSONE (STERAPRED UNI-PAK 21 TAB) 10 MG (21) TBPK tablet Take by mouth daily. Take as directed. Patient taking differently: Take 10-60 mg by mouth as needed.  05/27/19   Vanessa Kick, MD  sucralfate (CARAFATE) 1 GM/10ML suspension Take 10 mLs (1 g total) by mouth 4 (four) times daily -  with meals and at bedtime. 01/09/20   Daina Cara, Jarrett Soho, PA-C    Allergies    Ciprofloxacin, Hydrocortisone, and Ace inhibitors  Review of Systems   Review of Systems  Constitutional: Negative for appetite change, diaphoresis, fatigue, fever and unexpected weight change.  HENT: Negative for mouth sores.        Foreign body sensation in throat  Eyes: Negative for visual disturbance.  Respiratory: Negative for cough, chest tightness, shortness of breath and  wheezing.   Cardiovascular: Negative for chest pain.  Gastrointestinal: Positive for vomiting (x1). Negative for abdominal pain, constipation, diarrhea and nausea.  Endocrine: Negative for polydipsia, polyphagia and polyuria.  Genitourinary: Negative for dysuria, frequency, hematuria and urgency.  Musculoskeletal: Negative for back pain and neck stiffness.  Skin: Negative for rash.  Allergic/Immunologic: Negative for immunocompromised state.  Neurological: Negative for syncope, light-headedness and headaches.  Hematological: Does not bruise/bleed easily.  Psychiatric/Behavioral: Negative for sleep disturbance. The patient is not nervous/anxious.     Physical Exam Updated Vital Signs BP 118/72 (BP Location: Right Arm)   Pulse 64   Temp 97.9 F (36.6 C) (Oral)   Resp 16   SpO2 100%   Physical Exam Vitals and nursing note reviewed.  Constitutional:      General: She is not in acute distress.    Appearance: She is not diaphoretic.  HENT:     Head: Normocephalic.  Eyes:     General: No scleral icterus.    Conjunctiva/sclera: Conjunctivae normal.  Neck:      Thyroid: No thyroid mass.  Cardiovascular:     Rate and Rhythm: Normal rate and regular rhythm.     Pulses: Normal pulses.          Radial pulses are 2+ on the right side and 2+ on the left side.  Pulmonary:     Effort: No tachypnea, accessory muscle usage, prolonged expiration, respiratory distress or retractions.     Breath sounds: No stridor.     Comments: Equal chest rise. No increased work of breathing. Abdominal:     General: There is no distension.     Palpations: Abdomen is soft.     Tenderness: There is no abdominal tenderness. There is no guarding or rebound.  Musculoskeletal:     Cervical back: Normal range of motion. No rigidity. Normal range of motion.     Comments: Moves all extremities equally and without difficulty.  Lymphadenopathy:     Cervical: No cervical adenopathy.     Right cervical: No superficial, deep or posterior cervical adenopathy.    Left cervical: No superficial, deep or posterior cervical adenopathy.  Skin:    General: Skin is warm and dry.     Capillary Refill: Capillary refill takes less than 2 seconds.  Neurological:     Mental Status: She is alert.     GCS: GCS eye subscore is 4. GCS verbal subscore is 5. GCS motor subscore is 6.     Comments: Speech is clear and goal oriented.  Psychiatric:        Mood and Affect: Mood normal.     ED Results / Procedures / Treatments     Radiology DG Neck Soft Tissue  Result Date: 01/08/2020 CLINICAL DATA:  Foreign body EXAM: NECK SOFT TISSUES - 1+ VIEW COMPARISON:  None. FINDINGS: There is no evidence of retropharyngeal soft tissue swelling or epiglottic enlargement. The cervical airway is unremarkable and no radio-opaque foreign body identified. There is a linear radiopaque density projecting over the upper esophagus. This is favored to be a portion of the thyroid cartilage as opposed to a foreign body. IMPRESSION: Negative. Electronically Signed   By: Constance Holster M.D.   On: 01/08/2020 23:26     Procedures Procedures (including critical care time)  Medications Ordered in ED Medications  sucralfate (CARAFATE) 1 GM/10ML suspension 1 g (1 g Oral Given 01/09/20 0357)  alum & mag hydroxide-simeth (MAALOX/MYLANTA) 200-200-20 MG/5ML suspension 30 mL (30 mLs Oral Given 01/09/20 0357)  And  lidocaine (XYLOCAINE) 2 % viscous mouth solution 15 mL (15 mLs Oral Given 01/09/20 0357)    ED Course  I have reviewed the triage vital signs and the nursing notes.  Pertinent labs & imaging results that were available during my care of the patient were reviewed by me and considered in my medical decision making (see chart for details).    MDM Rules/Calculators/A&P                      Patient presents with foreign body sensation in her throat after eating fish.  Plain films without evidence of radiopaque foreign body such as bones.  Suspect foreign body sensation persists.  Patient is able to eat and drink here in the emergency department.  GI cocktail given.  6:16 AM Patient reports she feels significantly better after GI cocktail.  She is been able to eat a sandwich and drink without difficulty.  Pain and foreign body sensation resolved after GI cocktail.   Patient will follow up with primary care and GI as needed.  Discussed reasons to return to the emergency department.  Patient states understanding and is in agreement the plan.  The patient was discussed Dr. Randal Buba who agrees with the treatment plan.    Final Clinical Impression(s) / ED Diagnoses Final diagnoses:  Foreign body sensation in throat    Rx / DC Orders ED Discharge Orders         Ordered    sucralfate (CARAFATE) 1 GM/10ML suspension  3 times daily with meals & bedtime     01/09/20 0618           Deonna Krummel, Gwenlyn Perking 01/09/20 [id number]    Palumbo, April, MD 01/09/20 0710

## 2020-01-09 NOTE — ED Notes (Signed)
Pt able to tolerate PO intake without complaint or episode of N/V.

## 2020-01-09 NOTE — ED Notes (Signed)
Pt stated she is going outside to get fresh air.

## 2020-01-09 NOTE — Discharge Instructions (Addendum)
1. Medications: Liquid Carafate, usual home medications 2. Treatment: rest, drink plenty of fluids,  3. Follow Up: Please followup with your primary doctor in 2 days for discussion of your diagnoses and further evaluation after today's visit; if you do not have a primary care doctor use the resource guide provided to find one; Please return to the ER for new or worsening symptoms, inability to swallow, persistent vomiting.  If sensation persist for greater than 2 days, please follow-up with gastroenterology.

## 2020-01-12 ENCOUNTER — Telehealth: Payer: Self-pay | Admitting: *Deleted

## 2020-01-12 NOTE — Telephone Encounter (Signed)
Urine drug screen is positive for prescribed hydrocodone. There is a small amount of morphine, which is not a prescribed medication. She received treatment in ED for CP which included an IV dose of morphine on 12/28/19 and this may account for the appearance in the urine.

## 2020-01-27 ENCOUNTER — Encounter
Payer: No Typology Code available for payment source | Attending: Physical Medicine and Rehabilitation | Admitting: Physical Medicine and Rehabilitation

## 2020-01-27 DIAGNOSIS — G894 Chronic pain syndrome: Secondary | ICD-10-CM | POA: Insufficient documentation

## 2020-01-27 DIAGNOSIS — Z79891 Long term (current) use of opiate analgesic: Secondary | ICD-10-CM | POA: Insufficient documentation

## 2020-01-27 DIAGNOSIS — M1712 Unilateral primary osteoarthritis, left knee: Secondary | ICD-10-CM | POA: Insufficient documentation

## 2020-01-27 DIAGNOSIS — Z6839 Body mass index (BMI) 39.0-39.9, adult: Secondary | ICD-10-CM | POA: Insufficient documentation

## 2020-01-27 DIAGNOSIS — E559 Vitamin D deficiency, unspecified: Secondary | ICD-10-CM | POA: Insufficient documentation

## 2020-01-27 DIAGNOSIS — Z5181 Encounter for therapeutic drug level monitoring: Secondary | ICD-10-CM | POA: Insufficient documentation

## 2020-01-27 NOTE — Progress Notes (Deleted)
Subjective:    Patient ID: Kelsey Santana, female    DOB: 06-05-1961, 59 y.o.   MRN: [id number]  HPI  Pain Inventory Average Pain 10 Pain Right Now 10 My pain is sharp, stabbing and aching  In the last 24 hours, has pain interfered with the following? General activity 1 Relation with others 0 Enjoyment of life 1 What TIME of day is your pain at its worst? all Sleep (in general) Poor  Pain is worse with: walking, bending, inactivity, standing and some activites Pain improves with: medication and injections Relief from Meds: 8  Mobility walk with assistance use a cane ability to climb steps?  yes do you drive?  yes  Function not employed: date last employed . I need assistance with the following:  meal prep, household duties and shopping  Neuro/Psych trouble walking  Prior Studies Any changes since last visit?  no  Physicians involved in your care Any changes since last visit?  no   Family History  Problem Relation Age of Onset  . Alcohol abuse Mother   . Diabetes Mother   . Hyperlipidemia Mother   . Hypertension Mother   . Diabetes Brother   . Hyperlipidemia Brother   . Hypertension Brother   . Thyroid disease Neg Hx    Social History   Socioeconomic History  . Marital status: Single    Spouse name: Not on file  . Number of children: Not on file  . Years of education: Not on file  . Highest education level: Not on file  Occupational History  . Not on file  Tobacco Use  . Smoking status: Former Research scientist (life sciences)  . Smokeless tobacco: Former Systems developer  . Tobacco comment: quit 28 yrs ago  Substance and Sexual Activity  . Alcohol use: No  . Drug use: No  . Sexual activity: Never  Other Topics Concern  . Not on file  Social History Narrative   epworth sleepiness scale = 10 (11/15/15)    Lives alone in a 2 story home.  Has 3 children.  Currently not working.  Has lost 6 jobs in the past 2 months due to her leg numbness and pain.     Education: 2 years of  college.   Social Determinants of Health   Financial Resource Strain:   . Difficulty of Paying Living Expenses:   Food Insecurity:   . Worried About Charity fundraiser in the Last Year:   . Arboriculturist in the Last Year:   Transportation Needs:   . Film/video editor (Medical):   Marland Kitchen Lack of Transportation (Non-Medical):   Physical Activity:   . Days of Exercise per Week:   . Minutes of Exercise per Session:   Stress:   . Feeling of Stress :   Social Connections:   . Frequency of Communication with Friends and Family:   . Frequency of Social Gatherings with Friends and Family:   . Attends Religious Services:   . Active Member of Clubs or Organizations:   . Attends Archivist Meetings:   Marland Kitchen Marital Status:    Past Surgical History:  Procedure Laterality Date  . ABDOMINAL HYSTERECTOMY     complete  . COLONOSCOPY N/A 01/16/2017   Procedure: COLONOSCOPY;  Surgeon: Carol Ada, MD;  Location: [facility];  Service: Endoscopy;  Laterality: N/A;  . ESOPHAGOGASTRODUODENOSCOPY N/A 01/16/2017   Procedure: ESOPHAGOGASTRODUODENOSCOPY (EGD);  Surgeon: Carol Ada, MD;  Location: [facility];  Service: Endoscopy;  Laterality: N/A;  Past Medical History:  Diagnosis Date  . 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 GERD (gastroesophageal reflux disease)   . Hypercholesteremia   . Hypertension   . Migraine   . Pre-diabetes    There were no vitals taken for this visit.  Opioid Risk Score:   Fall Risk Score:  `1  Depression screen PHQ 2/9  Depression screen [facility] 2/9 10/29/2019 07/09/2019 03/19/2012  Decreased Interest 0 0 0  Down, Depressed, Hopeless 1 0 0  PHQ - 2 Score 1 0 0    Review of Systems  Musculoskeletal: Positive for gait problem.  All other systems reviewed and are negative.      Objective:   Physical Exam        Assessment & Plan:

## 2020-02-11 ENCOUNTER — Encounter: Payer: Self-pay | Admitting: Physical Medicine and Rehabilitation

## 2020-02-11 ENCOUNTER — Encounter (HOSPITAL_BASED_OUTPATIENT_CLINIC_OR_DEPARTMENT_OTHER): Payer: No Typology Code available for payment source | Admitting: Physical Medicine and Rehabilitation

## 2020-02-11 ENCOUNTER — Other Ambulatory Visit: Payer: Self-pay

## 2020-02-11 VITALS — BP 139/77 | HR 60 | Temp 97.7°F | Wt 297.2 lb

## 2020-02-11 DIAGNOSIS — M170 Bilateral primary osteoarthritis of knee: Secondary | ICD-10-CM | POA: Diagnosis not present

## 2020-02-11 DIAGNOSIS — Z6839 Body mass index (BMI) 39.0-39.9, adult: Secondary | ICD-10-CM

## 2020-02-11 DIAGNOSIS — Z5181 Encounter for therapeutic drug level monitoring: Secondary | ICD-10-CM | POA: Diagnosis present

## 2020-02-11 DIAGNOSIS — M1712 Unilateral primary osteoarthritis, left knee: Secondary | ICD-10-CM | POA: Diagnosis present

## 2020-02-11 DIAGNOSIS — E559 Vitamin D deficiency, unspecified: Secondary | ICD-10-CM

## 2020-02-11 DIAGNOSIS — Z79891 Long term (current) use of opiate analgesic: Secondary | ICD-10-CM | POA: Diagnosis present

## 2020-02-11 DIAGNOSIS — G894 Chronic pain syndrome: Secondary | ICD-10-CM | POA: Diagnosis not present

## 2020-02-11 MED ORDER — HYDROCODONE-ACETAMINOPHEN 10-325 MG PO TABS: 1.0000 | ORAL_TABLET | Freq: Four times a day (QID) | ORAL | 0 refills | Status: DC | PRN

## 2020-02-11 MED ORDER — PANTOPRAZOLE SODIUM 20 MG PO TBEC: 20.0000 mg | DELAYED_RELEASE_TABLET | Freq: Every day | ORAL | 3 refills | Status: DC

## 2020-02-11 NOTE — Progress Notes (Signed)
Subjective:    Patient ID: Kelsey Santana, female    DOB: [birth year], 60 y.o.   MRN: [id number]  HPI Mrs. Alviar presents for follow-up of her bilateral knee osteoarthritis, left > right.   She continues to experience worsening of her rightknee pain. Her left knee pain has improved since Synvisc injection. She has had 1 fall since last visit due to her knees buckling. She feels she has been trying to offload her left knee and as a result putting more force on her right knee. She has been using a cane to ambulate and her ambulation has been more limited recently. At times she has had to rest in bed most of the day. At other times she is able to be more active. She asks whether she can get bilateral knee steroid injections today. Her last were over 1 year ago.   Previously she had tried to be more active, walking the park and track and using the treadmill at the [facility]. She had lost 10 pounds over the previous 2 months, but has since gained 7 lbs back in the last month. She said she has been snacking on M&Ms. Current weight is 297lbs.She has been taking Hydrocodone-Tylenol 10mg -325mg threetimes per day as needed for her knee pain. She has been able to take only 2 some days and so feels ready to push her appointments out to 5 weeks.   She has been taking the Vitamin D supplement weekly and has been feeling better taking it. She has 2 more pills left.    She may be interested in aquatic therapy once pain improves. I previously gave her a referral for knee surgery outside the [place] but she never heard back from them.   The Gabapentin makes her too drowsy to use in the day but she does use it at night and this helps her to sleep well.   Pain Inventory Average Pain 10 Pain Right Now 2 My pain is intermittent, sharp, stabbing, aching and cutting type pain.  In the last 24 hours, has pain interfered with the following? General activity 10 Relation with others 6 Enjoyment of life 10 What TIME  of day is your pain at its worst? morning & night. Sleep (in general) Poor  Pain is worse with: walking, bending, standing and some activites Pain improves with: rest, pacing activities, medication and injections Relief from Meds: 10  Mobility walk with assistance use a cane how many minutes can you walk? 5-10 mins. ability to climb steps?  no do you drive?  yes Do you have any goals in this area?  yes  Function employed # of hrs/week use to work 40 plus hours. 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 I need assistance with the following:  meal prep, household duties and shopping Do you have any goals in this area?  yes  Neuro/Psych weakness tingling trouble walking spasms  Prior Studies Any changes since last visit?  no  Physicians involved in your care Any changes since last visit?  no   Family History  Problem Relation Age of Onset  . Alcohol abuse Mother   . Diabetes Mother   . Hyperlipidemia Mother   . Hypertension Mother   . Diabetes Brother   . Hyperlipidemia Brother   . Hypertension Brother   . Thyroid disease Neg Hx    Social History   Socioeconomic History  . Marital status: Single    Spouse name: Not on file  . Number of children:  Not on file  . Years of education: Not on file  . Highest education level: Not on file  Occupational History  . Not on file  Tobacco Use  . Smoking status: Former Research scientist (life sciences)  . Smokeless tobacco: Former Systems developer  . Tobacco comment: quit 28 yrs ago  Vaping Use  . Vaping Use: Never used  Substance and Sexual Activity  . Alcohol use: No  . Drug use: No  . Sexual activity: Never  Other Topics Concern  . Not on file  Social History Narrative   epworth sleepiness scale = 10 (11/15/15)    Lives alone in a 2 story home.  Has 3 children.  Currently not working.  Has lost 6 jobs in the past 2 months due to her leg numbness and pain.     Education: 2 years of college.   Social Determinants of Health   Financial Resource  Strain:   . Difficulty of Paying Living Expenses:   Food Insecurity:   . Worried About Charity fundraiser in the Last Year:   . Arboriculturist in the Last Year:   Transportation Needs:   . Film/video editor (Medical):   Marland Kitchen Lack of Transportation (Non-Medical):   Physical Activity:   . Days of Exercise per Week:   . Minutes of Exercise per Session:   Stress:   . Feeling of Stress :   Social Connections:   . Frequency of Communication with Friends and Family:   . Frequency of Social Gatherings with Friends and Family:   . Attends Religious Services:   . Active Member of Clubs or Organizations:   . Attends Archivist Meetings:   Marland Kitchen Marital Status:    Past Surgical History:  Procedure Laterality Date  . ABDOMINAL HYSTERECTOMY     complete  . COLONOSCOPY N/A 01/16/2017   Procedure: COLONOSCOPY;  Surgeon: Carol Ada, MD;  Location: [facility];  Service: Endoscopy;  Laterality: N/A;  . ESOPHAGOGASTRODUODENOSCOPY N/A 01/16/2017   Procedure: ESOPHAGOGASTRODUODENOSCOPY (EGD);  Surgeon: Carol Ada, MD;  Location: [facility];  Service: Endoscopy;  Laterality: N/A;   Past Medical History:  Diagnosis Date  . 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 GERD (gastroesophageal reflux disease)   . Hypercholesteremia   . Hypertension   . Migraine   . Pre-diabetes    BP 139/77   Pulse 60   Temp 97.7 F (36.5 C)   Wt 297 lb 3.2 oz (134.8 kg)   SpO2 95%   BMI 49.46 kg/m   Opioid Risk Score:   Fall Risk Score:  `1  Depression screen PHQ 2/9  Depression screen [facility] 2/9 10/29/2019 07/09/2019 03/19/2012  Decreased Interest 0 0 0  Down, Depressed, Hopeless 1 0 0  PHQ - 2 Score 1 0 0   Review of Systems  Constitutional: Negative.   HENT: Negative.   Eyes: Negative.   Respiratory: Negative.   Cardiovascular: Negative.   Gastrointestinal: Negative.   Endocrine: Negative.   Genitourinary: Negative.   Musculoskeletal: Positive for back pain, gait problem, joint swelling and  neck pain.       Spasms in the knees  Skin: Positive for rash.  Neurological: Positive for weakness and numbness.       Tingling in legs  Hematological: Negative.   Psychiatric/Behavioral: Negative.        Objective:   Physical Exam Gen: no distress, normal appearing HEENT: oral mucosa pink and moist, NCAT Cardio: Reg rate Chest:  normal effort, normal rate of breathing Abd: soft, non-distended Ext: no edema Skin: intact Neuro/Musculoskeletal:AOx3. 5/5 strength in the bilateral upper extremities. She has 4/5 strength throughout RLE and4-/5 throughout LLE, limited to pain.Normal gait.Tender to palpation in bilateral knees. Gait: Antalgic, requiring cane, slow cadence.  Psych: pleasant, normal affect    Assessment & Plan:  59 year old woman who presents for follow up ofbilateralchronic knee pain (L>R)  Bilateral knee osteoarthritis -Bilateral corticosteroid injections performed today after discussing risks and benefits of procedure and obtaining consent (procedure note below).  --I will renew herHydrocodone 10mg  QID as needed. Discussed pushing her appointment to 5 weeks as she has been able to maintain on 3 pills per day recently. Does not require Gaba pentin refill at this time- she is using sparingly at night as the combination with Norco makes her too groggy in the morning but she needs the Norco to sleep. Advised that she use Tylenol and Advil as needed to avoid increasing Norco dose further.Educated that opioids are not appropriate for chronic pain and the goal of this prescription will be to improve her pain and function until she can received surgery. She agreed that her goal is to get off opioids once able.  --Provided orthopedic referral for surgery outside the [place] previously; she has not yet heard for them. --Continue working out at [employer]. Advised against treadmill which is hard on the knees. Recommended exercise bike and aquatic therapy. Provided referral for  aquatic therapy previously.   Morbid obesity, BMI 49.46 --She chose not to take the Orlistat. Educated regarding minimizing sugar in diet and she is motivated to make changes. Current weight is297 lbs; encouraged her on her 11 lb weight loss over the first 2 months, has gained back 7 lbs this last month.  -She was very interested in learning more about intermittent fasting. I discussed this with her and provided her with a book on the topic.   Recent chest pain: -Cardiac workup negative, but advised that she does have cardiac risk factors and she should return to ED if she experiences similar symptoms.   All questions were encouraged and answered. RTC in 5 weeks.  Knee injection  Indication:Bilateral Knee pain not relieved by medication management and other conservative care.  Informed consent was obtained after describing risks and benefits of the procedure with the patient, this includes bleeding, bruising, infection and medication side effects. The patient wishes to proceed and has given written consent. The patient was placed in a recumbent position. The medial aspect of both knees were marked and prepped with Betadine and alcohol. It was then entered with a 25-gauge 1-1/2 inch needle and 1 mL of betamethasone with 3L of lidocaine were injected into the knee joints. The patient tolerated the procedure well. Post procedure instructions were given.

## 2020-03-15 ENCOUNTER — Other Ambulatory Visit: Payer: Self-pay

## 2020-03-15 NOTE — Telephone Encounter (Signed)
Mrs. Krul called:  She is out of pain medicine because her appointment had to be stretched out 5 weeks. She would like an early refill. Pain so bad she can hardly walk.   Please advise or prescribe. Thank you

## 2020-03-16 MED ORDER — HYDROCODONE-ACETAMINOPHEN 10-325 MG PO TABS: 1.0000 | ORAL_TABLET | Freq: Four times a day (QID) | ORAL | 0 refills | Status: DC | PRN

## 2020-03-17 ENCOUNTER — Encounter
Payer: No Typology Code available for payment source | Attending: Physical Medicine and Rehabilitation | Admitting: Physical Medicine and Rehabilitation

## 2020-03-17 ENCOUNTER — Other Ambulatory Visit: Payer: Self-pay

## 2020-03-17 ENCOUNTER — Encounter: Payer: Self-pay | Admitting: Physical Medicine and Rehabilitation

## 2020-03-17 VITALS — BP 158/84 | HR 74 | Temp 97.7°F | Ht 65.0 in | Wt 294.0 lb

## 2020-03-17 DIAGNOSIS — M1711 Unilateral primary osteoarthritis, right knee: Secondary | ICD-10-CM | POA: Insufficient documentation

## 2020-03-17 DIAGNOSIS — Z79891 Long term (current) use of opiate analgesic: Secondary | ICD-10-CM | POA: Diagnosis present

## 2020-03-17 DIAGNOSIS — Z6839 Body mass index (BMI) 39.0-39.9, adult: Secondary | ICD-10-CM | POA: Insufficient documentation

## 2020-03-17 DIAGNOSIS — Z5181 Encounter for therapeutic drug level monitoring: Secondary | ICD-10-CM | POA: Insufficient documentation

## 2020-03-17 DIAGNOSIS — E559 Vitamin D deficiency, unspecified: Secondary | ICD-10-CM | POA: Insufficient documentation

## 2020-03-17 DIAGNOSIS — M170 Bilateral primary osteoarthritis of knee: Secondary | ICD-10-CM

## 2020-03-17 DIAGNOSIS — G894 Chronic pain syndrome: Secondary | ICD-10-CM | POA: Insufficient documentation

## 2020-03-17 DIAGNOSIS — M1712 Unilateral primary osteoarthritis, left knee: Secondary | ICD-10-CM | POA: Insufficient documentation

## 2020-03-17 MED ORDER — GABAPENTIN 300 MG PO CAPS: 300.0000 mg | ORAL_CAPSULE | Freq: Every day | ORAL | 1 refills | Status: DC

## 2020-03-17 NOTE — Progress Notes (Addendum)
Subjective:    Patient ID: Kelsey Santana, female    DOB: [birth year], 60 y.o.   MRN: [id number]  HPI Kelsey Santana presents for follow-up of her bilateral knee osteoarthritis, right> left.   Her left knee pain is very well controlled this visit because of the steroid injection last visit. She had good relief from the steroid injection for her left knee. Her right knee had good relief initially but in the last three days she has noted increasing severity of pain as well as a nodule in her right medial knee.   The Gabapentin relaxes her and allows her to sleep at night. If she takes this with the hydrocodone then she feels too sluggish in the morning.   She lost 5 lbs since last appointment. She has been trying intermittent fasting and read the book I gave her on this topic.   She has had to be at home due to her difficulty ambulating. Had difficulty walking into clinic today   Prior history: She continues to experience worsening of her rightknee pain. Her left knee pain has improved since Synvisc injection. She has had 1 fall since last visit due to her knees buckling. She feels she has been trying to offload her left knee and as a result putting more force on her right knee. She has been using a cane to ambulate and her ambulation has been more limited recently. At times she has had to rest in bed most of the day. At other times she is able to be more active. She asks whether she can get bilateral knee steroid injections today. Her last were over 1 year ago.   Previously she had tried to be more active, walking the park and track and using the treadmill at the [facility]. She had lost 10 pounds over the previous 2 months, but has since gained 7 lbs back in the last month. She said she has been snacking on M&Ms. Current weight is 297lbs.She has been taking Hydrocodone-Tylenol 10mg -325mg threetimes per day as needed for her knee pain. She has been able to take only 2 some days and so feels ready to  push her appointments out to 5 weeks.   She has been taking the Vitamin D supplement weekly and has been feeling better taking it. She has 2 more pills left.    She may be interested in aquatic therapy once pain improves. I previously gave her a referral for knee surgery outside the [place] but she never heard back from them.   The Gabapentin makes her too drowsy to use in the day but she does use it at night and this helps her to sleep well.   Pain Inventory Average Pain 10 Pain Right Now 10 My pain is constant, sharp, stabbing, aching and cutting type pain.  In the last 24 hours, has pain interfered with the following? General activity 10 Relation with others 10 Enjoyment of life 10 What TIME of day is your pain at its worst? all Sleep (in general) Poor  Pain is worse with: walking, bending, standing and some activites Pain improves with: rest, pacing activities, medication and injections Relief from Meds: 10  Mobility walk with assistance use a cane how many minutes can you walk? 1-2 ability to climb steps?  yes do you drive?  yes Do you have any goals in this area?  yes  Function employed # of hrs/week use to work 40 plus hours. 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  I need assistance with the following:  meal prep, household duties and shopping Do you have any goals in this area?  yes  Neuro/Psych weakness tingling trouble walking spasms  Prior Studies Any changes since last visit?  no  Physicians involved in your care Any changes since last visit?  no   Family History  Problem Relation Age of Onset  . Alcohol abuse Mother   . Diabetes Mother   . Hyperlipidemia Mother   . Hypertension Mother   . Diabetes Brother   . Hyperlipidemia Brother   . Hypertension Brother   . Thyroid disease Neg Hx    Social History   Socioeconomic History  . Marital status: Single    Spouse name: Not on file  . Number of children: Not on file  . Years of education:  Not on file  . Highest education level: Not on file  Occupational History  . Not on file  Tobacco Use  . Smoking status: Former Research scientist (life sciences)  . Smokeless tobacco: Former Systems developer  . Tobacco comment: quit 28 yrs ago  Vaping Use  . Vaping Use: Never used  Substance and Sexual Activity  . Alcohol use: No  . Drug use: No  . Sexual activity: Never  Other Topics Concern  . Not on file  Social History Narrative   epworth sleepiness scale = 10 (11/15/15)    Lives alone in a 2 story home.  Has 3 children.  Currently not working.  Has lost 6 jobs in the past 2 months due to her leg numbness and pain.     Education: 2 years of college.   Social Determinants of Health   Financial Resource Strain:   . Difficulty of Paying Living Expenses:   Food Insecurity:   . Worried About Charity fundraiser in the Last Year:   . Arboriculturist in the Last Year:   Transportation Needs:   . Film/video editor (Medical):   Marland Kitchen Lack of Transportation (Non-Medical):   Physical Activity:   . Days of Exercise per Week:   . Minutes of Exercise per Session:   Stress:   . Feeling of Stress :   Social Connections:   . Frequency of Communication with Friends and Family:   . Frequency of Social Gatherings with Friends and Family:   . Attends Religious Services:   . Active Member of Clubs or Organizations:   . Attends Archivist Meetings:   Marland Kitchen Marital Status:    Past Surgical History:  Procedure Laterality Date  . ABDOMINAL HYSTERECTOMY     complete  . COLONOSCOPY N/A 01/16/2017   Procedure: COLONOSCOPY;  Surgeon: Carol Ada, MD;  Location: [facility];  Service: Endoscopy;  Laterality: N/A;  . ESOPHAGOGASTRODUODENOSCOPY N/A 01/16/2017   Procedure: ESOPHAGOGASTRODUODENOSCOPY (EGD);  Surgeon: Carol Ada, MD;  Location: [facility];  Service: Endoscopy;  Laterality: N/A;   Past Medical History:  Diagnosis Date  . 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 GERD (gastroesophageal reflux disease)   .  Hypercholesteremia   . Hypertension   . Migraine   . Pre-diabetes    There were no vitals taken for this visit.  Opioid Risk Score:   Fall Risk Score:  `1  Depression screen PHQ 2/9  Depression screen [facility] 2/9 02/11/2020 10/29/2019 07/09/2019 03/19/2012  Decreased Interest 0 0 0 0  Down, Depressed, Hopeless 0 1 0 0  PHQ - 2 Score 0 1 0 0  Altered sleeping 0 - - -  Tired, decreased energy 0 - - -  Change in appetite 0 - - -  Feeling bad or failure about yourself  0 - - -  Trouble concentrating 0 - - -  Moving slowly or fidgety/restless 0 - - -  Suicidal thoughts 0 - - -  PHQ-9 Score 0 - - -   Review of Systems  Constitutional: Negative.   HENT: Negative.   Eyes: Negative.   Respiratory: Negative.   Cardiovascular: Negative.   Gastrointestinal: Negative.   Endocrine: Negative.   Genitourinary: Negative.   Musculoskeletal: Positive for back pain, gait problem, joint swelling and neck pain.       Spasms in the knees  Skin: Positive for rash.  Neurological: Positive for weakness and numbness.       Tingling in legs  Hematological: Negative.   Psychiatric/Behavioral: Negative.        Objective:   Physical Exam Gen: no distress, normal appearing HEENT: oral mucosa pink and moist, NCAT Cardio: Reg rate Chest: normal effort, normal rate of breathing Abd: soft, non-distended Ext: no edema Skin: intact Neuro/Musculoskeletal:AOx3. 5/5 strength in the bilateral upper extremities. She has 4/5 strength throughout RLE and4-/5 throughout LLE, limited to pain.Normal gait.Tender to palpation in bilateral knees, much more so on right medial aspect, there is palpable nodule and this area is the most tender. Gait: Antalgic, requiring wheelchair today Psych: pleasant, normal affect    Assessment & Plan:  59 year old woman who presents for follow up ofbilateralchronic knee pain- today right is much worse than left  Bilateral knee osteoarthritis -Bilateral corticosteroid  injections performed last visit, June 23rd, 2021 with good benefit in bilateral knees for 4 weeks. Continues to have benefits for left knee but right knee pain has worsened over the past three days and patient has sensation of nodule in medial knee.  --Since patient's appointment had to be pushed to 5 weeks due to my schedule, I provided her refill of herHydrocodone 10mg  QID last week. Prior to this current flare she had been able to maintain on three pills per day for some time. I will provide her refill for Gabapentin 300mg  at night. She may take two of these to help her sleep better. Avoid taking with norco as this combination makes her groggy in the morning. Advised that she use Tylenol and Advil as needed to avoid increasing Norco dose further.Educated that opioids are not appropriate for chronic pain and the goal of this prescription will be to improve her pain and function until she can received surgery. She agreed that her goal is to get off opioids once able.  --Provided orthopedic referral for surgery outside the [place] previously; she has not yet heard for them.Her current weight is an issue. --Hold off working out at [employer] during this flare. Advised against treadmill which is hard on the knees. Recommended exercise bike and aquatic therapy. Provided referral for aquatic therapy previously.  -Recommend MRI of right knee given worsening of pain and right medial aspect palpable and tender nodule.  -Diagnostic ultrasound performed in office today to assess for meniscal damage and to further assess palpable nodule as follows:  Examination: Ultrasound of the Right Knee Findings: The extensor mechanism, including the quadriceps tendon, patella, and patellar tendon, is normal without bursal abnormalities. Mild medial comparmental joint effusion or synovial hypertrophy. The medial collateral and lateral collateral ligaments are normal. Unremarkable iliotibial tract, biceps femoris, popliteus tendon, and  common peroneal nerve. No Baker cyst. Medial meniscal border incongruent with mild effusion.  Lateral  Meniscus intact.  Impression: Medial meniscal border incongruent with mild effusion  Morbid obesity, BMI 49.46 to 48.92  --She chose not to take the Orlistat. Educated regarding minimizing sugar in diet and she is motivated to make changes. Current weight is294 lbs; encouraged her on her 7lb weight loss in the past 3 months. -She was very interested in learning more about intermittent fasting. I discussed this with her and provided her with a book on the topic. She has read this and notes improvements in her weight loss goals with this approach.   Recent chest pain: -Cardiac workup negative, but advised that she does have cardiac risk factors and she should return to ED if she experiences similar symptoms.   All questions were encouraged and answered. RTC in 4 weeks

## 2020-03-22 ENCOUNTER — Ambulatory Visit (HOSPITAL_COMMUNITY)
Admission: RE | Admit: 2020-03-22 | Discharge: 2020-03-22 | Disposition: A | Discharge status: Home / Self Care | Payer: No Typology Code available for payment source | Source: Ambulatory Visit | Attending: Physical Medicine and Rehabilitation | Admitting: Physical Medicine and Rehabilitation

## 2020-03-22 ENCOUNTER — Other Ambulatory Visit: Payer: Self-pay

## 2020-03-22 DIAGNOSIS — M1711 Unilateral primary osteoarthritis, right knee: Secondary | ICD-10-CM | POA: Insufficient documentation

## 2020-03-23 ENCOUNTER — Telehealth: Payer: Self-pay

## 2020-03-23 NOTE — Telephone Encounter (Signed)
Patient calling for MRI results of the knee

## 2020-03-23 NOTE — Telephone Encounter (Signed)
I will give her a call today with the results!

## 2020-03-30 ENCOUNTER — Telehealth: Payer: Self-pay | Admitting: Orthopedic Surgery

## 2020-03-30 NOTE — Telephone Encounter (Signed)
Pt called stating the [facility] gave her authorization to be seen and she would like to verify that we received it before making appt.  [phone]

## 2020-04-01 NOTE — Telephone Encounter (Signed)
Forwarding to you since I haven't been here and the referrals would go to you. Thanks!

## 2020-04-02 ENCOUNTER — Ambulatory Visit: Payer: No Typology Code available for payment source | Admitting: Orthopaedic Surgery

## 2020-04-02 ENCOUNTER — Encounter: Payer: Self-pay | Admitting: Orthopedic Surgery

## 2020-04-02 ENCOUNTER — Ambulatory Visit (INDEPENDENT_AMBULATORY_CARE_PROVIDER_SITE_OTHER): Payer: No Typology Code available for payment source | Admitting: Orthopedic Surgery

## 2020-04-02 VITALS — Ht 63.0 in | Wt 297.6 lb

## 2020-04-02 DIAGNOSIS — M170 Bilateral primary osteoarthritis of knee: Secondary | ICD-10-CM | POA: Diagnosis not present

## 2020-04-02 NOTE — Progress Notes (Signed)
Office Visit Note   Patient: Kelsey Santana           Date of Birth: Nov 22, 1960           MRN: [id number] Visit Date: 04/02/2020 Requested by: [facility] [address] [place],  [place] [address] PCP: [facility]  Subjective: Chief Complaint  Patient presents with  . Right Knee - Pain    HPI: Kelsey Santana is a 59 y.o. female who presents to the office complaining of right knee pain.  Patient is a history of bilateral knee pain.  She notes that her right knee pain has been significantly worse in the last 3 weeks.  She notes constant pain.  She denies any history of injury.  She notes any activity causes severe pain in her right knee.  Pain is waking her up at night.  She localizes the majority of her pain to the medial aspect of the right knee but notes lateral pain as well.  She has locking symptoms every day.  She is ambulating with a cane.  She had a steroid injection 6 weeks ago that provided about 2 days of relief before wearing off.  She is in pain management and takes hydrocodone 10 mg 4 times a day.  She has also been taking etodolac but this bothers her stomach.  She denies any groin pain, numbness/tingling but does note right lateral hip pain.  She states that she is unable to work with the pain that she has currently.              ROS: All systems reviewed are negative as they relate to the chief complaint within the history of present illness.  Patient denies fevers or chills.  Assessment & Plan: Visit Diagnoses:  1. Bilateral primary osteoarthritis of knee     Plan: Patient is a 59 year old female who presents complaining of right knee pain.  She has had significantly worse knee pain in the last 3 weeks without injury.  She had MRI scan that revealed age advanced osteoarthritic changes of the right knee with medial meniscal tear and lateral meniscal tear with extrusion.  She does have locking symptoms that likely are  originating from the extruded lateral meniscus tear but the majority of her pain is medial sided.  She has significant loss of cartilage throughout as well as bone bruising of the proximal medial tibia.  She requests knee replacement.  Discussed the risk and benefits of the procedure, including the long difficult recovery.  Patient understands and wishes to proceed with surgery.  Follow-up after procedure. She has had a knee injection approximately 6 weeks ago. For that reason we will plan this surgery sometime after September 15 to diminish the risk of infection. Also plan to use vancomycin powder at the time of surgery.  Patient does have increased BMI which places her at increased risk for complication. Patient states that she really cannot exercise because her knees hurt too much. Discussed with her extensively how patients in her category do have increased risk of complications particularly infection. Nonetheless she states that she is willing to accept that risk in order to achieve some degree of pain relief. The risk and benefits including not limited to infection nerve vessel damage knee stiffness incomplete pain relief and incomplete restoration of function all discussed. Would like to use press-fit replacement if the bone quality is adequate. All questions answered.  Follow-Up Instructions: No follow-ups on file.   Orders:  No  orders of the defined types were placed in this encounter.  No orders of the defined types were placed in this encounter.     Procedures: No procedures performed   Clinical Data: No additional findings.  Objective: Vital Signs: Ht 5\' 3"  (1.6 m)   Wt 297 lb 9.6 oz (135 kg)   BMI 52.72 kg/m   Physical Exam:  Constitutional: Patient appears well-developed HEENT:  Head: Normocephalic Eyes:EOM are normal Neck: Normal range of motion Cardiovascular: Normal rate Pulmonary/chest: Effort normal Neurologic: Patient is alert Skin: Skin is warm Psychiatric:  Patient has normal mood and affect  Ortho Exam: Ortho exam demonstrates right knee with positive effusion.  She has intact extensor mechanism of the right knee.  Tenderness to palpation most over the medial joint line but also over the lateral joint line, patellar tendon, quad tendon.  No significant pain with right hip range of motion.  Negative straight leg raise.  Specialty Comments:  No specialty comments available.  Imaging: No results found.   PMFS History: Patient Active Problem List   Diagnosis Date Noted  . Chest pain, musculoskeletal   . 'light-for-dates' infant with signs of fetal malnutrition 06/07/2017  . Hypocalcemia 06/07/2017  . Vitamin D deficiency 06/07/2017  . Chest pain 11/15/2015  . Depression 04/15/2012  . Morbid obesity due to excess calories ([place]) 04/15/2012  . Tinea corporis 04/15/2012  . Fatigue 04/15/2012  . Epigastric pain 03/19/2012  . Migraine headache 03/11/2012  . GERD (gastroesophageal reflux disease) 03/11/2012  . HTN (hypertension) 03/11/2012  . HLD (hyperlipidemia) 03/11/2012   Past Medical History:  Diagnosis Date  . 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 GERD (gastroesophageal reflux disease)   . Hypercholesteremia   . Hypertension   . Migraine   . Pre-diabetes     Family History  Problem Relation Age of Onset  . Alcohol abuse Mother   . Diabetes Mother   . Hyperlipidemia Mother   . Hypertension Mother   . Diabetes Brother   . Hyperlipidemia Brother   . Hypertension Brother   . Thyroid disease Neg Hx     Past Surgical History:  Procedure Laterality Date  . ABDOMINAL HYSTERECTOMY     complete  . COLONOSCOPY N/A 01/16/2017   Procedure: COLONOSCOPY;  Surgeon: Carol Ada, MD;  Location: [facility];  Service: Endoscopy;  Laterality: N/A;  . ESOPHAGOGASTRODUODENOSCOPY N/A 01/16/2017   Procedure: ESOPHAGOGASTRODUODENOSCOPY (EGD);  Surgeon: Carol Ada, MD;  Location: [facility];  Service: Endoscopy;  Laterality: N/A;   Social  History   Occupational History  . Not on file  Tobacco Use  . Smoking status: Former Research scientist (life sciences)  . Smokeless tobacco: Former Systems developer  . Tobacco comment: quit 28 yrs ago  Vaping Use  . Vaping Use: Never used  Substance and Sexual Activity  . Alcohol use: No  . Drug use: No  . Sexual activity: Never

## 2020-04-05 ENCOUNTER — Telehealth: Payer: Self-pay | Admitting: Physical Medicine and Rehabilitation

## 2020-04-05 ENCOUNTER — Other Ambulatory Visit: Payer: Self-pay | Admitting: Physical Medicine and Rehabilitation

## 2020-04-05 MED ORDER — ETODOLAC 300 MG PO CAPS: 300.0000 mg | ORAL_CAPSULE | Freq: Three times a day (TID) | ORAL | 0 refills | Status: DC

## 2020-04-05 NOTE — Telephone Encounter (Signed)
Patient wanting to know if Dr. Ranell Patrick can call in a medicine Etodolac, it helps her to walk better and she doesn't have to take as much pain medication (hydrocodone).  Please call patient.

## 2020-04-05 NOTE — Telephone Encounter (Signed)
Sent!

## 2020-04-06 ENCOUNTER — Other Ambulatory Visit: Payer: Self-pay | Admitting: Physical Medicine and Rehabilitation

## 2020-04-06 MED ORDER — ETODOLAC 300 MG PO CAPS: 300.0000 mg | ORAL_CAPSULE | Freq: Three times a day (TID) | ORAL | 0 refills | Status: DC

## 2020-04-08 ENCOUNTER — Telehealth: Payer: Self-pay | Admitting: Orthopedic Surgery

## 2020-04-08 NOTE — Telephone Encounter (Signed)
Pt called stating she is checking on an authorization for surgery and a surgery date; pt would like a CB  [phone]

## 2020-04-10 ENCOUNTER — Other Ambulatory Visit: Payer: Self-pay | Admitting: Physical Medicine and Rehabilitation

## 2020-04-10 MED ORDER — HYDROCODONE-ACETAMINOPHEN 10-325 MG PO TABS: 1.0000 | ORAL_TABLET | Freq: Four times a day (QID) | ORAL | 0 refills | Status: DC | PRN

## 2020-04-12 ENCOUNTER — Other Ambulatory Visit: Payer: Self-pay

## 2020-04-14 ENCOUNTER — Other Ambulatory Visit: Payer: Self-pay

## 2020-04-14 ENCOUNTER — Encounter
Payer: No Typology Code available for payment source | Attending: Physical Medicine and Rehabilitation | Admitting: Physical Medicine and Rehabilitation

## 2020-04-14 ENCOUNTER — Encounter: Payer: Self-pay | Admitting: Physical Medicine and Rehabilitation

## 2020-04-14 VITALS — BP 118/82 | HR 90 | Temp 98.6°F | Ht 63.0 in | Wt 292.6 lb

## 2020-04-14 DIAGNOSIS — M170 Bilateral primary osteoarthritis of knee: Secondary | ICD-10-CM | POA: Diagnosis not present

## 2020-04-14 DIAGNOSIS — Z5181 Encounter for therapeutic drug level monitoring: Secondary | ICD-10-CM | POA: Insufficient documentation

## 2020-04-14 DIAGNOSIS — Z79891 Long term (current) use of opiate analgesic: Secondary | ICD-10-CM | POA: Insufficient documentation

## 2020-04-14 DIAGNOSIS — M1711 Unilateral primary osteoarthritis, right knee: Secondary | ICD-10-CM | POA: Diagnosis present

## 2020-04-14 DIAGNOSIS — E559 Vitamin D deficiency, unspecified: Secondary | ICD-10-CM | POA: Insufficient documentation

## 2020-04-14 DIAGNOSIS — G894 Chronic pain syndrome: Secondary | ICD-10-CM | POA: Diagnosis present

## 2020-04-14 DIAGNOSIS — M1712 Unilateral primary osteoarthritis, left knee: Secondary | ICD-10-CM | POA: Insufficient documentation

## 2020-04-14 DIAGNOSIS — Z6839 Body mass index (BMI) 39.0-39.9, adult: Secondary | ICD-10-CM | POA: Diagnosis not present

## 2020-04-14 MED ORDER — ETODOLAC 300 MG PO CAPS: 300.0000 mg | ORAL_CAPSULE | Freq: Three times a day (TID) | ORAL | 0 refills | Status: DC

## 2020-04-14 MED ORDER — PANTOPRAZOLE SODIUM 20 MG PO TBEC: 20.0000 mg | DELAYED_RELEASE_TABLET | Freq: Every day | ORAL | 3 refills | Status: DC | PRN

## 2020-04-14 NOTE — Progress Notes (Signed)
Subjective:    Patient ID: Kelsey Santana, female    DOB: 1960-09-22, 59 y.o.   MRN: [id number]  HPI Kelsey Santana presents for follow-up of her bilateral knee osteoarthritis, right> left.   She has been able to walk more since last visit. She met with [place]sey surgeon and is planned for surgery in September.   She walked in th supermarket for about an hour.   She is going to order a copy of Kelsey Santana.  She is trying to overdo too much weight on her eyes.  She has been able to walk better! Kelsey Etodolac has helped a lot.   Last visit: Her left knee pain is very well controlled this visit because of Kelsey steroid injection last visit. She had good relief from Kelsey steroid injection for her left knee. Her right knee had good relief initially but in Kelsey last three days she has noted increasing severity of pain as well as a nodule in her right medial knee.   [place] Gabapentin relaxes her and allows her to sleep at night. If she takes this with Kelsey hydrocodone then she feels too sluggish in [place]sey morning.   She lost 5 lbs since last appointment. She has been trying intermittent fasting and read [place] book I gave her on this topic.   She has had to be at home due to her difficulty ambulating. Had difficulty walking into clinic today   Prior history: She continues to experience worsening of her rightknee pain. Her left knee pain has improved since Synvisc injection. She has had 1 fall since last visit due to her knees buckling. She feels she has been trying to offload her left knee and as a result putting more force on her right knee. She has been using a cane to ambulate and her ambulation has been more limited recently. At times she has had to rest in bed most of Kelsey day. At other times she is able to be more active. She asks whether she can get bilateral knee steroid injections today. Her last were over 1 year ago.   Previously she had tried to be more active, walking [place] park and track and  using [place] treadmill at [place] [facility] She had lost 10 pounds over Kelsey previous 2 months, but has since gained 7 lbs back in [place] last month. She said she has been snacking on M&Ms. Current weight is 297lbs.She has been taking Hydrocodone-Tylenol 9m-325mgthreetimes per day as needed for her knee pain. She has been able to take only 2 some days and so feels ready to push her appointments out to 5 weeks.   She has been taking [place] Vitamin D supplement weekly and has been feeling better taking it. She has 2 more pills left.    She may be interested in aquatic therapy once pain improves. I previously gave her [place] never heard back from them.   [place] Gabapentin makes her too drowsy to use in [place] day but she does use it at night and this helps her to sleep well.   Pain Inventory Average Pain 10 Pain Right Now 1 My pain is intermittent, stabbing and aching  In [place] last 24 hours, has pain interfered with Kelsey following? General activity 10 Relation with others 10 Enjoyment of life 10 What TIME of day is your pain at its worst? all day Sleep (in general) Poor  Pain is worse with: walking, bending, sitting, inactivity,  standing and some activites Pain improves with: medication and injections Relief from Meds: 10      Family History  Problem Relation Age of Onset  . Alcohol abuse Mother   . Diabetes Mother   . Hyperlipidemia Mother   . Hypertension Mother   . Diabetes Brother   . Hyperlipidemia Brother   . Hypertension Brother   . Thyroid disease Neg Hx    Social History   Socioeconomic History  . Marital status: Single    Spouse name: Not on file  . Number of children: Not on file  . Years of education: Not on file  . Highest education level: Not on file  Occupational History  . Not on file  Tobacco Use  . Smoking status: Former Research scientist (life sciences)  . Smokeless tobacco: Former Systems developer  . Tobacco comment: quit 28 yrs ago  Vaping Use  . Vaping Use:  Never used  Substance and Sexual Activity  . Alcohol use: No  . Drug use: No  . Sexual activity: Never  Other Topics Concern  . Not on file  Social History Narrative   epworth sleepiness scale = 10 (11/15/15)    Lives alone in a 2 story home.  Has 3 children.  Currently not working.  Has lost 6 jobs in [place] past 2 months due to her leg numbness and pain.     Education: 2 years of college.   Social Determinants of Health   Financial Resource Strain:   . Difficulty of Paying Living Expenses: Not on file  Food Insecurity:   . Worried About Charity fundraiser in [place] Last Year: Not on file  . Ran Out of Food in [place] Last Year: Not on file  Transportation Needs:   . Lack of Transportation (Medical): Not on file  . Lack of Transportation (Non-Medical): Not on file  Physical Activity:   . Days of Exercise per Week: Not on file  . Minutes of Exercise per Session: Not on file  Stress:   . Feeling of Stress : Not on file  Social Connections:   . Frequency of Communication with Friends and Family: Not on file  . Frequency of Social Gatherings with Friends and Family: Not on file  . Attends Religious Services: Not on file  . Active Member of Clubs or Organizations: Not on file  . Attends Archivist Meetings: Not on file  . Marital Status: Not on file   Past Surgical History:  Procedure Laterality Date  . ABDOMINAL HYSTERECTOMY     complete  . COLONOSCOPY N/A 01/16/2017   Procedure: COLONOSCOPY;  Surgeon: Carol Ada, MD;  Location: [facility];  Service: Endoscopy;  Laterality: N/A;  . ESOPHAGOGASTRODUODENOSCOPY N/A 01/16/2017   Procedure: ESOPHAGOGASTRODUODENOSCOPY (EGD);  Surgeon: Carol Ada, MD;  Location: [facility];  Service: Endoscopy;  Laterality: N/A;   Past Medical History:  Diagnosis Date  . 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 GERD (gastroesophageal reflux disease)   . Hypercholesteremia   . Hypertension   . Migraine   . Pre-diabetes    There were no vitals  taken for this visit.  Opioid Risk Score:   Fall Risk Score:  `1  Depression screen PHQ 2/9  Depression screen [facility] 2/9 02/11/2020 10/29/2019 07/09/2019 03/19/2012  Decreased Interest 0 0 0 0  Down, Depressed, Hopeless 0 1 0 0  PHQ - 2 Score 0 1 0 0  Altered sleeping 0 - - -  Tired, decreased energy 0 - - -  Change in appetite 0 - - -  Feeling bad or failure about yourself  0 - - -  Trouble concentrating 0 - - -  Moving slowly or fidgety/restless 0 - - -  Suicidal thoughts 0 - - -  PHQ-9 Score 0 - - -   Review of Systems  Constitutional: Negative.   HENT: Negative.   Eyes: Negative.   Respiratory: Negative.   Cardiovascular: Negative.   Gastrointestinal: Negative.   Endocrine: Negative.   Genitourinary: Negative.   Musculoskeletal: Positive for back pain, gait problem, joint swelling and neck pain.       Spasms in Kelsey knees  Skin: Positive for rash.  Neurological: Positive for weakness and numbness.       Tingling in legs  Hematological: Negative.   Psychiatric/Behavioral: Negative.        Objective:   Physical Exam Gen: no distress, normal appearing HEENT: oral mucosa pink and moist, NCAT Cardio: Reg rate Chest: normal effort, normal rate of breathing Abd: soft, non-distended Ext: no edema Skin: intact Neuro/Musculoskeletal:AOx3. 5/5 strength in [place] bilateral upper extremities. She has 4/5 strength throughout RLE and4-/5 throughout LLE, limited to pain.Normal gait.Tender to palpation in bilateral knees, much more so on right medial aspect, there is palpable nodule and this area is Kelsey most tender. Gait: Antalgic, requiring wheelchair today Psych: pleasant, normal affect    Assessment & Plan:  59 year old woman who presents for follow up ofbilateralchronic knee pain- today right is much worse than left  Bilateral knee osteoarthritis -Bilateral corticosteroid injections performed last visit, June 23rd, 2021 with good benefit in bilateral knees for 4 weeks.  Continues to have benefits for left knee but right knee pain has worsened over Kel[place] past three days and patient has sensation of nodule in medial knee.  --Since patient's appointment had to be pushed to 5 weeks due to my schedule, I provided her refill of herHydrocodone 2m QID last week. Prior to this current flare she had been able to maintain on three pills per day for some time. I will provide her refill for Gabapentin 3070mat night. She may take two of these to help her sleep better. Avoid taking with norco as this combination makes her groggy in [place] morning. Advised that she use Tylenol and Advil as needed to avoid increasing Norco dose further.Educated that opioids are not appropriate for chronic pain and Kelsey goal of this prescription will be to improve her pain and function until she can received surgery. She agreed that her goal is to get off opioids once able.  --She has surgery scheduled in September! -Continue Etodolac as needed. Refill provided today. -Norco refill already sent.  -She has been trying to walk more and was able to walk 1 hr in [place] store! -She has a bright and positive mood today/  --Hold off working out at [employer] this flare. Advised against treadmill which is hard on Kelsey knees. Recommended exercise bike and aquatic therapy. Provided referral for aquatic therapy previously.  -Recommend MRI of right knee given worsening of pain and right medial aspect palpable and tender nodule.  -Diagnostic ultrasound performed in office today to assess for meniscal damage and to further assess palpable nodule as follows:   Morbid obesity, 292 lbs now. Lost 10 lbs over Kel[place] past three months. She stopped [place] prednisone. I provided her a list of healthy foods that help prevent pain.  --She chose not to take [place] Orlistat. Educated regarding minimizing sugar in diet and she is  motivated to make changes.  -She was very interested in learning more about intermittent fasting. I discussed  this with her and provided her with a book on [place] topic. She has read this and notes improvements in her weight loss goals with this approach.  -She has been trying to eat once per day -She has been trying to cut back beef and pork. -Her attitude is excellent.   Recent chest pain: -Cardiac workup negative, but advised that she does have cardiac risk factors and she should return to ED if she experiences similar symptoms.   All questions were encouraged and answered. RTC in 4 weeks

## 2020-05-03 ENCOUNTER — Other Ambulatory Visit: Payer: Self-pay | Admitting: Physical Medicine and Rehabilitation

## 2020-05-03 MED ORDER — HYDROCODONE-ACETAMINOPHEN 10-325 MG PO TABS: 1.0000 | ORAL_TABLET | Freq: Four times a day (QID) | ORAL | 0 refills | Status: DC | PRN

## 2020-05-10 ENCOUNTER — Encounter (HOSPITAL_COMMUNITY): Payer: No Typology Code available for payment source

## 2020-05-17 ENCOUNTER — Other Ambulatory Visit: Payer: Self-pay

## 2020-05-17 ENCOUNTER — Other Ambulatory Visit (HOSPITAL_COMMUNITY): Payer: No Typology Code available for payment source

## 2020-05-21 ENCOUNTER — Encounter
Payer: No Typology Code available for payment source | Attending: Physical Medicine and Rehabilitation | Admitting: Physical Medicine and Rehabilitation

## 2020-05-21 ENCOUNTER — Other Ambulatory Visit: Payer: Self-pay

## 2020-05-21 ENCOUNTER — Encounter: Payer: Self-pay | Admitting: Physical Medicine and Rehabilitation

## 2020-05-21 VITALS — BP 150/82 | HR 65 | Temp 98.5°F | Ht 63.0 in | Wt 197.0 lb

## 2020-05-21 DIAGNOSIS — M170 Bilateral primary osteoarthritis of knee: Secondary | ICD-10-CM | POA: Insufficient documentation

## 2020-05-21 MED ORDER — HYDROCODONE-ACETAMINOPHEN 10-325 MG PO TABS: 1.0000 | ORAL_TABLET | Freq: Three times a day (TID) | ORAL | 0 refills | Status: DC | PRN

## 2020-05-21 MED ORDER — GABAPENTIN 300 MG PO CAPS: 300.0000 mg | ORAL_CAPSULE | Freq: Every day | ORAL | 1 refills | Status: DC

## 2020-05-21 NOTE — Progress Notes (Signed)
Bilateral knee injection  Indication:Bilateral Knee pain not relieved by medication management and other conservative care.  Informed consent was obtained after describing risks and benefits of the procedure with the patient, this includes bleeding, bruising, infection and medication side effects. The patient wishes to proceed and has given written consent. The patient was placed in a seated position. The medial aspect of bilateral knees were marked and prepped with Betadine and alcohol. They were then entered with a 25-gauge 1-1/2 inch needle and after negative draw back for blood, a solution containing Monovisc was injected into both knees. The patient tolerated the procedure well. Post procedure instructions were given.

## 2020-05-28 ENCOUNTER — Other Ambulatory Visit (HOSPITAL_COMMUNITY): Payer: No Typology Code available for payment source

## 2020-05-28 ENCOUNTER — Inpatient Hospital Stay (HOSPITAL_COMMUNITY): Admission: RE | Admit: 2020-05-28 | Payer: No Typology Code available for payment source | Source: Ambulatory Visit

## 2020-06-03 ENCOUNTER — Inpatient Hospital Stay: Payer: No Typology Code available for payment source | Admitting: Orthopedic Surgery

## 2020-06-12 NOTE — Progress Notes (Signed)
Virtual Visit via Telephone Note   This visit type was conducted due to national recommendations for restrictions regarding the COVID-19 Pandemic (e.g. social distancing) in an effort to limit this patient's exposure and mitigate transmission in our community.  Due to her co-morbid illnesses, this patient is at least at moderate risk for complications without adequate follow up.  This format is felt to be most appropriate for this patient at this time.  The patient did not have access to video technology/had technical difficulties with video requiring transitioning to audio format only (telephone).  All issues noted in this document were discussed and addressed.  No physical exam could be performed with this format.  Please refer to the patient's chart for her  consent to telehealth for [facility].    Date:  06/14/2020   ID:  Kelsey Santana, DOB January 26, 1961, MRN [id number] The patient was identified using 2 identifiers.  Patient Location: Home Provider Location: Home Office  PCP:  [facility]  Cardiologist:  Pixie Casino, MD  Electrophysiologist:  None   Evaluation Performed:  Follow-Up Visit  Chief Complaint:  Routine follow-up  History of Present Illness:    Kelsey Santana is a 59 y.o. female with a PMH of HTN, HLD, and obesity, who presents for 6 month follow-up.   She was last evaluated by cardiology at an outpatient visit with Jory Sims, NP 02/2019 for post-hospital follow-up. She continued to have reproducible chest pain with sharp pains and heaviness in her chest following a hospitalization 01/2019 for evaluation of the same. It was felt her symptoms were likely MSK/GI in etiology and she was asking for pain medications, though request denied. Echocardiogram 01/2019 showed EF 60-65%, G1DD, normal RV function, and no significant valvular abnormalities. She had a CTA Chest that admission which showed mild coronary artery calcifications. Her last ischemic  evaluation was a NST in 2017 which showed no evidence of ischemia or infarction.   She presents today for routine follow-up. She reports no episodes of chest pain since placing her brother in a SNF 2 months ago. She attributed a significant portion of her chest pain to stress. She has been working on weight loss and has lost 30lbs with dietary modifications. She has stopped eating late night meals which has helped with her nighttime GERD. She reports intermittent LE edema. She does note that she has been sleeping in a recliner more recently due to trouble with insomnia. She has occasional PND and daytime somnolence. She states her last sleep study was negative though is >5 yr old. She has some chronic DOE which is unchanged. No complaints of dizziness, lightheadedness, syncope, or palpitations. She is anticipating knee replacement surgery in January. She can complete 4 METs without anginal complaints.   The patient does not have symptoms concerning for COVID-19 infection (fever, chills, cough, or new shortness of breath).    Past Medical History:  Diagnosis Date  . 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 GERD (gastroesophageal reflux disease)   . Hypercholesteremia   . Hypertension   . Migraine   . Pre-diabetes    Past Surgical History:  Procedure Laterality Date  . ABDOMINAL HYSTERECTOMY     complete  . COLONOSCOPY N/A 01/16/2017   Procedure: COLONOSCOPY;  Surgeon: Carol Ada, MD;  Location: [facility];  Service: Endoscopy;  Laterality: N/A;  . ESOPHAGOGASTRODUODENOSCOPY N/A 01/16/2017   Procedure: ESOPHAGOGASTRODUODENOSCOPY (EGD);  Surgeon: Carol Ada, MD;  Location: [facility];  Service:  Endoscopy;  Laterality: N/A;     Current Meds  Medication Sig  . amLODipine (NORVASC) 5 MG tablet Take 1 tablet (5 mg total) by mouth at bedtime.  Marland Kitchen atorvastatin (LIPITOR) 20 MG tablet Take 1 tablet (20 mg total) by mouth at bedtime.  . gabapentin (NEURONTIN) 300 MG capsule Take 1 capsule (300 mg  total) by mouth at bedtime.  Marland Kitchen HYDROcodone-acetaminophen (NORCO) 10-325 MG tablet Take 1 tablet by mouth 3 (three) times daily as needed for severe pain.  Marland Kitchen ibuprofen (ADVIL) 200 MG tablet Take 2 tablets (400 mg total) by mouth every 8 (eight) hours as needed for headache.  . lidocaine (XYLOCAINE) 5 % ointment APPLY TO THIGH DAILY AS NEEDED FOR PAIN (Patient taking differently: Apply 1 application topically See admin instructions. APPLY TO THIGH DAILY AS NEEDED FOR PAIN)  . meloxicam (MOBIC) 7.5 MG tablet Take 7.5 mg by mouth daily.  . naproxen (NAPROSYN) 500 MG tablet Take 1 tablet (500 mg total) by mouth 2 (two) times daily.  . pantoprazole (PROTONIX) 20 MG tablet Take 1 tablet (20 mg total) by mouth daily as needed. (Patient taking differently: Take 40 mg by mouth daily as needed. )  . predniSONE (STERAPRED UNI-PAK 21 TAB) 10 MG (21) TBPK tablet Take by mouth daily. Take as directed. (Patient taking differently: Take 10-60 mg by mouth as needed. )  . Vitamin D, Ergocalciferol, (DRISDOL) 1.25 MG (50000 UNIT) CAPS capsule Take 1 capsule (50,000 Units total) by mouth every 7 (seven) days. (Patient taking differently: Take 2,000 Units by mouth daily. )     Allergies:   Ciprofloxacin, Hydrocortisone, and Ace inhibitors   Social History   Tobacco Use  . Smoking status: Former Research scientist (life sciences)  . Smokeless tobacco: Former Systems developer  . Tobacco comment: quit 28 yrs ago  Vaping Use  . Vaping Use: Never used  Substance Use Topics  . Alcohol use: No  . Drug use: No     Family Hx: The patient's family history includes Alcohol abuse in her mother; Diabetes in her brother and mother; Hyperlipidemia in her brother and mother; Hypertension in her brother and mother. There is no history of Thyroid disease.  ROS:   Please see the history of present illness.     All other systems reviewed and are negative.   Prior CV studies:   The following studies were reviewed today:  Echocardiogram 01/2019: 1. The left  ventricle has normal systolic function with an ejection  fraction of 60-65%. The cavity size was normal. Left ventricular diastolic  Doppler parameters are consistent with impaired relaxation.  2. The right ventricle has normal systolic function. The cavity was  normal. There is no increase in right ventricular wall thickness.  3. The aortic valve is tricuspid. Mild thickening of the aortic valve.  Mild calcification of the aortic valve.   NST 2017:  Nuclear stress EF: 64%.  The left ventricular ejection fraction is normal (55-65%).  There was no ST segment deviation noted during stress.  The study is normal.   Normal stress nuclear study with no ischemia or infarction; EF 64.   Labs/Other Tests and Data Reviewed:    EKG:  An ECG dated 01/12/20 was personally reviewed today and demonstrated:  sinus rhythm with rate 67 bpm, no TWI, no STE/D, QTc 415  Recent Labs: 12/28/2019: BUN 11; Creatinine, Ser 0.85; Hemoglobin 13.8; Platelets 250; Potassium 3.8; Sodium 143   Recent Lipid Panel Lab Results  Component Value Date/Time   CHOL 301 (H) 04/16/2012  09:16 AM   TRIG 156 (H) 04/16/2012 09:16 AM   HDL 48 04/16/2012 09:16 AM   CHOLHDL 6.3 04/16/2012 09:16 AM   LDLCALC 222 (H) 04/16/2012 09:16 AM    Wt Readings from Last 3 Encounters:  06/14/20 210 lb (95.3 kg)  05/21/20 197 lb (89.4 kg)  04/14/20 292 lb 9.6 oz (132.7 kg)     Risk Assessment/Calculations:      Objective:    Vital Signs:  BP 132/87   Ht 5\' 3"  (1.6 m)   Wt 210 lb (95.3 kg)   BMI 37.20 kg/m    VITAL SIGNS:  reviewed GEN:  no acute distress RESPIRATORY:  speaking in full sentences without anginal complaints CARDIOVASCULAR:  no peripheral edema NEURO:  A&O x3 PSYCH:  normal affect  ASSESSMENT & PLAN:    1. HTN: BP 132/87 today. She reports today's reading is on the lower side of her average readings. Also with some intermittent edema - Will start HCTZ 12.5mg  daily for improved BP control - Continue  amlodipine  2. HLD: followed by PCP. No recent lipids available to review - Continue atorvastatin  3. Suspected OSA: she reports trouble with insomnia, daytime somnolence, and PND. Prior sleep study >5 yr ago was negative.   4. Preoperative evaluation: she is anticipating knee replacement surgery 08/2020. She can complete 4 METs without anginal complaints.  - Given past medical history and time since last visit, based on ACC/AHA guidelines, Tesslyn L Sanguinetti would be at acceptable risk for the planned procedure without further cardiovascular testing.  - The patient was advised that if she develops new symptoms prior to surgery to contact our office to arrange for a follow-up visit, and she verbalized understanding.  COVID-19 Education: The signs and symptoms of COVID-19 were discussed with the patient and how to seek care for testing (follow up with PCP or arrange E-visit).   The importance of social distancing was discussed today.  Time:   Today, I have spent 12 minutes with the patient with telehealth technology discussing the above problems.     Medication Adjustments/Labs and Tests Ordered: Current medicines are reviewed at length with the patient today.  Concerns regarding medicines are outlined above.   Tests Ordered: Orders Placed This Encounter  Procedures  . Home sleep test    Medication Changes: Meds ordered this encounter  Medications  . hydrochlorothiazide (MICROZIDE) 12.5 MG capsule    Sig: Take 1 capsule (12.5 mg total) by mouth daily.    Dispense:  90 capsule    Refill:  3    Follow Up:  In Person in 1 year(s)  Signed, Abigail Butts, PA-C  06/14/2020 8:53 AM    [place]

## 2020-06-14 ENCOUNTER — Encounter: Payer: Self-pay | Admitting: Medical

## 2020-06-14 ENCOUNTER — Telehealth (INDEPENDENT_AMBULATORY_CARE_PROVIDER_SITE_OTHER): Payer: No Typology Code available for payment source | Admitting: Medical

## 2020-06-14 ENCOUNTER — Telehealth: Payer: Self-pay

## 2020-06-14 VITALS — BP 132/87 | Ht 63.0 in | Wt 210.0 lb

## 2020-06-14 DIAGNOSIS — Z01810 Encounter for preprocedural cardiovascular examination: Secondary | ICD-10-CM

## 2020-06-14 DIAGNOSIS — R0600 Dyspnea, unspecified: Secondary | ICD-10-CM

## 2020-06-14 DIAGNOSIS — E7800 Pure hypercholesterolemia, unspecified: Secondary | ICD-10-CM | POA: Diagnosis not present

## 2020-06-14 DIAGNOSIS — R29818 Other symptoms and signs involving the nervous system: Secondary | ICD-10-CM | POA: Diagnosis not present

## 2020-06-14 DIAGNOSIS — I10 Essential (primary) hypertension: Secondary | ICD-10-CM | POA: Diagnosis not present

## 2020-06-14 DIAGNOSIS — R400 Somnolence: Secondary | ICD-10-CM | POA: Diagnosis not present

## 2020-06-14 MED ORDER — HYDROCHLOROTHIAZIDE 12.5 MG PO CAPS
12.5000 mg | ORAL_CAPSULE | Freq: Every day | ORAL | 3 refills | Status: DC
Start: 2020-06-14 — End: 2021-06-21

## 2020-06-14 NOTE — Telephone Encounter (Signed)
  Patient Consent for Virtual Visit         Kelsey Santana has provided verbal consent on 06/14/2020 for a virtual visit (video or telephone).   CONSENT FOR VIRTUAL VISIT FOR:  Kelsey Santana  By participating in this virtual visit I agree to the following:  I hereby voluntarily request, consent and authorize [facility] and its employed or contracted physicians, physician assistants, nurse practitioners or other licensed health care professionals (the Practitioner), to provide me with telemedicine health care services (the "Services") as deemed necessary by the treating Practitioner. I acknowledge and consent to receive the Services by the Practitioner via telemedicine. I understand that the telemedicine visit will involve communicating with the Practitioner through live audiovisual communication technology and the disclosure of certain medical information by electronic transmission. I acknowledge that I have been given the opportunity to request an in-person assessment or other available alternative prior to the telemedicine visit and am voluntarily participating in the telemedicine visit.  I understand that I have the right to withhold or withdraw my consent to the use of telemedicine in the course of my care at any time, without affecting my right to future care or treatment, and that the Practitioner or I may terminate the telemedicine visit at any time. I understand that I have the right to inspect all information obtained and/or recorded in the course of the telemedicine visit and may receive copies of available information for a reasonable fee.  I understand that some of the potential risks of receiving the Services via telemedicine include:  Marland Kitchen Delay or interruption in medical evaluation due to technological equipment failure or disruption; . Information transmitted may not be sufficient (e.g. poor resolution of images) to allow for appropriate medical decision making by the  Practitioner; and/or  . In rare instances, security protocols could fail, causing a breach of personal health information.  Furthermore, I acknowledge that it is my responsibility to provide information about my medical history, conditions and care that is complete and accurate to the best of my ability. I acknowledge that Practitioner's advice, recommendations, and/or decision may be based on factors not within their control, such as incomplete or inaccurate data provided by me or distortions of diagnostic images or specimens that may result from electronic transmissions. I understand that the practice of medicine is not an exact science and that Practitioner makes no warranties or guarantees regarding treatment outcomes. I acknowledge that a copy of this consent can be made available to me via my patient portal ([place]), or I can request a printed copy by calling the office of [facility].    I understand that my insurance will be billed for this visit.   I have read or had this consent read to me. . I understand the contents of this consent, which adequately explains the benefits and risks of the Services being provided via telemedicine.  . I have been provided ample opportunity to ask questions regarding this consent and the Services and have had my questions answered to my satisfaction. . I give my informed consent for the services to be provided through the use of telemedicine in my medical care

## 2020-06-14 NOTE — Patient Instructions (Signed)
Medication Instructions:  START- Hydrochlorothiazide 12.5 mg by mouth daily  *If you need a refill on your cardiac medications before your next appointment, please call your pharmacy*   Lab Work: None Ordered   Testing/Procedures: Your physician has recommended that you have a sleep study. This test records several body functions during sleep, including: brain activity, eye movement, oxygen and carbon dioxide blood levels, heart rate and rhythm, breathing rate and rhythm, the flow of air through your mouth and nose, snoring, body muscle movements, and chest and belly movement.   Follow-Up: At [facility], you and your health needs are our priority.  As part of our continuing mission to provide you with exceptional heart care, we have created designated Provider Care Teams.  These Care Teams include your primary Cardiologist (physician) and Advanced Practice Providers (APPs -  Physician Assistants and Nurse Practitioners) who all work together to provide you with the care you need, when you need it.  We recommend signing up for the patient portal called "MyChart".  Sign up information is provided on this After Visit Summary.  MyChart is used to connect with patients for Virtual Visits (Telemedicine).  Patients are able to view lab/test results, encounter notes, upcoming appointments, etc.  Non-urgent messages can be sent to your provider as well.   To learn more about what you can do with MyChart, go to [email or web address].    Your next appointment:   1 year(s)  The format for your next appointment:   In Person  Provider:   You may see Pixie Casino, MD or one of the following Advanced Practice Providers on your designated Care Team:    Almyra Deforest, PA-C  Fabian Sharp, PA-C or   Roby Lofts, [place]

## 2020-06-18 ENCOUNTER — Encounter: Payer: No Typology Code available for payment source | Admitting: Physical Medicine and Rehabilitation

## 2020-06-24 ENCOUNTER — Telehealth: Payer: Self-pay | Admitting: Medical

## 2020-06-24 NOTE — Telephone Encounter (Signed)
This patient had insurance with the [place].  Her last referral expired 06-01-20, but was not for our office.  [facility] has [facility] out to her and gave her 90 days to comply with them.  She did not follow through, so basically she has no insurance.  She would have to start over with the [place] and follow their steps to see if they would approve for her to have a sleep study.  Please advise.

## 2020-06-29 ENCOUNTER — Other Ambulatory Visit: Payer: Self-pay

## 2020-06-29 ENCOUNTER — Encounter
Payer: No Typology Code available for payment source | Attending: Physical Medicine and Rehabilitation | Admitting: Physical Medicine and Rehabilitation

## 2020-06-29 ENCOUNTER — Encounter: Payer: Self-pay | Admitting: Physical Medicine and Rehabilitation

## 2020-06-29 VITALS — BP 138/85 | HR 73 | Temp 98.3°F | Ht 63.0 in | Wt 302.0 lb

## 2020-06-29 DIAGNOSIS — G894 Chronic pain syndrome: Secondary | ICD-10-CM | POA: Diagnosis not present

## 2020-06-29 DIAGNOSIS — Z79891 Long term (current) use of opiate analgesic: Secondary | ICD-10-CM | POA: Diagnosis not present

## 2020-06-29 DIAGNOSIS — Z5181 Encounter for therapeutic drug level monitoring: Secondary | ICD-10-CM | POA: Diagnosis not present

## 2020-06-29 NOTE — Patient Instructions (Signed)
[place] tea

## 2020-06-29 NOTE — Progress Notes (Signed)
 Subjective:    Patient ID: Kelsey Santana, female    DOB: 05/20/1961, 59 y.o.   MRN: [id number]  HPI  Kelsey Santana presents with bilateral knee pain secondary to OA.  She almost fell down the stairs. Right knee is giving out more. She is worried about her surgery, which is scheduled in January. Some of them needed a repeat surgery. She has not taken any Norco since Friday. She has 70 something left since the last prescription. Has routine UDS today. She has been taking Ibuprofen.   Sleeping at night has been more. She has had positive benefit from melatonin before.   Weight is currently 302 lbs. She has gained since she has been snacking at the desk.   She has started working from home.   Prior history: Kelsey Santana presents for follow-up of her bilateral knee osteoarthritis, right> left.   She has been able to walk more since last visit. She met with the surgeon and is planned for surgery in September.   She walked in th supermarket for about an hour.   She is going to order a copy of [employer].  She is trying to overdo too much weight on her eyes.  She has been able to walk better! The Etodolac has helped a lot.   Last visit: Her left knee pain is very well controlled this visit because of the steroid injection last visit. She had good relief from the steroid injection for her left knee. Her right knee had good relief initially but in the last three days she has noted increasing severity of pain as well as a nodule in her right medial knee.   The Gabapentin relaxes her and allows her to sleep at night. If she takes this with the hydrocodone then she feels too sluggish in the morning.   She lost 5 lbs since last appointment. She has been trying intermittent fasting and read the book I gave her on this topic.   She has had to be at home due to her difficulty ambulating. Had difficulty walking into clinic today   Prior history: She continues to experience worsening of  her rightknee pain. Her left knee pain has improved since Synvisc injection. She has had 1 fall since last visit due to her knees buckling. She feels she has been trying to offload her left knee and as a result putting more force on her right knee. She has been using a cane to ambulate and her ambulation has been more limited recently. At times she has had to rest in bed most of the day. At other times she is able to be more active. She asks whether she can get bilateral knee steroid injections today. Her last were over 1 year ago.   Previously she had tried to be more active, walking the park and track and using the treadmill at the [employer]. She had lost 10 pounds over the previous 2 months, but has since gained 7 lbs back in the last month. She said she has been snacking on M&Ms. Current weight is 297lbs.She has been taking Hydrocodone-Tylenol 10mg-325mgthreetimes per day as needed for her knee pain. She has been able to take only 2 some days and so feels ready to push her appointments out to 5 weeks.   She has been taking the Vitamin D supplement weekly and has been feeling better taking it. She has 2 more pills left.    She may be interested in aquatic therapy once   pain improves. I previously gave her a referral for knee surgery outside the [place] but she never heard back from them.   The Gabapentin makes her too drowsy to use in the day but she does use it at night and this helps her to sleep well.   Pain Inventory Average Pain 10 Pain Right Now 3 My pain is constant, sharp, stabbing and aching  In the last 24 hours, has pain interfered with the following? General activity 10 Relation with others 10 Enjoyment of life 10 What TIME of day is your pain at its worst? all day Sleep (in general) Poor  Pain is worse with: walking, bending, sitting, inactivity, standing and some activites Pain improves with: medication and injections Relief from Meds: 10      Family History  Problem  Relation Age of Onset  . Alcohol abuse Mother   . Diabetes Mother   . Hyperlipidemia Mother   . Hypertension Mother   . Diabetes Brother   . Hyperlipidemia Brother   . Hypertension Brother   . Thyroid disease Neg Hx    Social History   Socioeconomic History  . Marital status: Single    Spouse name: Not on file  . Number of children: Not on file  . Years of education: Not on file  . Highest education level: Not on file  Occupational History  . Not on file  Tobacco Use  . Smoking status: Former Research scientist (life sciences)  . Smokeless tobacco: Former Systems developer  . Tobacco comment: quit 28 yrs ago  Vaping Use  . Vaping Use: Never used  Substance and Sexual Activity  . Alcohol use: No  . Drug use: No  . Sexual activity: Never  Other Topics Concern  . Not on file  Social History Narrative   epworth sleepiness scale = 10 (11/15/15)    Lives alone in a 2 story home.  Has 3 children.  Currently not working.  Has lost 6 jobs in the past 2 months due to her leg numbness and pain.     Education: 2 years of college.   Social Determinants of Health   Financial Resource Strain:   . Difficulty of Paying Living Expenses: Not on file  Food Insecurity:   . Worried About Charity fundraiser in the Last Year: Not on file  . Ran Out of Food in the Last Year: Not on file  Transportation Needs:   . Lack of Transportation (Medical): Not on file  . Lack of Transportation (Non-Medical): Not on file  Physical Activity:   . Days of Exercise per Week: Not on file  . Minutes of Exercise per Session: Not on file  Stress:   . Feeling of Stress : Not on file  Social Connections:   . Frequency of Communication with Friends and Family: Not on file  . Frequency of Social Gatherings with Friends and Family: Not on file  . Attends Religious Services: Not on file  . Active Member of Clubs or Organizations: Not on file  . Attends Archivist Meetings: Not on file  . Marital Status: Not on file   Past Surgical  History:  Procedure Laterality Date  . ABDOMINAL HYSTERECTOMY     complete  . COLONOSCOPY N/A 01/16/2017   Procedure: COLONOSCOPY;  Surgeon: Carol Ada, MD;  Location: [facility];  Service: Endoscopy;  Laterality: N/A;  . ESOPHAGOGASTRODUODENOSCOPY N/A 01/16/2017   Procedure: ESOPHAGOGASTRODUODENOSCOPY (EGD);  Surgeon: Carol Ada, MD;  Location: [facility];  Service: Endoscopy;  Laterality:  N/A;   Past Medical History:  Diagnosis Date  . 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 GERD (gastroesophageal reflux disease)   . Hypercholesteremia   . Hypertension   . Migraine   . Pre-diabetes    There were no vitals taken for this visit.  Opioid Risk Score:   Fall Risk Score:  `1  Depression screen PHQ 2/9  Depression screen PHQ 2/9 05/21/2020 04/14/2020 02/11/2020 10/29/2019 07/09/2019 03/19/2012  Decreased Interest 0 0 0 0 0 0  Down, Depressed, Hopeless 0 0 0 1 0 0  PHQ - 2 Score 0 0 0 1 0 0  Altered sleeping - - 0 - - -  Tired, decreased energy - - 0 - - -  Change in appetite - - 0 - - -  Feeling bad or failure about yourself  - - 0 - - -  Trouble concentrating - - 0 - - -  Moving slowly or fidgety/restless - - 0 - - -  Suicidal thoughts - - 0 - - -  PHQ-9 Score - - 0 - - -   Review of Systems  Constitutional: Negative.   HENT: Negative.   Eyes: Negative.   Respiratory: Negative.   Cardiovascular: Negative.   Gastrointestinal: Negative.   Endocrine: Negative.   Genitourinary: Negative.   Musculoskeletal: Positive for back pain, gait problem, joint swelling and neck pain.       Spasms in the knees  Skin: Positive for rash.  Neurological: Positive for weakness and numbness.       Tingling in legs  Hematological: Negative.   Psychiatric/Behavioral: Negative.        Objective:   Physical Exam Gen: no distress, normal appearing HEENT: oral mucosa pink and moist, NCAT Cardio: Reg rate Chest: normal effort, normal rate of breathing Abd: soft, non-distended Ext: no  edema Skin: intact Neuro/Musculoskeletal:AOx3. 5/5 strength in the bilateral upper extremities. She has 4/5 strength throughout RLE and4-/5 throughout LLE, limited to pain.Normal gait.Tender to palpation in bilateral knees, much more so on right medial aspect, there is palpable nodule and this area is the most tender. Gait: Antalgic, requiring wheelchair today Psych: pleasant, normal affect     Assessment & Plan:  58-year-old woman who presents for follow up ofbilateralchronic knee pain- today right is much worse than left  Bilateral knee osteoarthritis -Bilateral corticosteroid injections performed last visit, June 23rd, 2021 with good benefit in bilateral knees for 4 weeks. Continues to have benefits for left knee but right knee pain has worsened over the past three days and patient has sensation of nodule in medial knee. Left knee did really well since the last shot. Right knee has still been buckling.  --Since patient's appointment had to be pushed to 5 weeks due to my schedule, I provided her refill of herHydrocodone 10mg QID last week. Prior to this current flare she had been able to maintain on three pills per day for some time. I will provide her refill for Gabapentin 300mg at night. She may take two of these to help her sleep better. Avoid taking with norco as this combination makes her groggy in the morning. Advised that she use Tylenol and Advil as needed to avoid increasing Norco dose further.Educated that opioids are not appropriate for chronic pain and the goal of this prescription will be to improve her pain and function until she can received surgery. She agreed that her goal is to get off opioids once able.  --She has   surgery scheduled in January and is a little nervous about this.  -Continue Ibuprofen as needed -She will tell me when she needs a refill.  -She has been trying to walk more and was able to walk 1 hr in the store! She continues this! No longer needs walker or  cane.! -She has a bright and positive mood today - Recommended exercise bike and aquatic therapy. Provided referral for aquatic therapy previously.  -Recommend MRI of right knee given worsening of pain and right medial aspect palpable and tender nodule.  -Diagnostic ultrasound performed in office today to assess for meniscal damage and to further assess palpable nodule as follows:   Morbid obesity, gained back the 10 lbs she previously lost. I provided her a list of healthy foods that help prevent pain.  -Ro[place]ois tea 1-3x/day -Restart turmeric daily.  --She chose not to take the Orlistat. Educated regarding minimizing sugar in diet and she is motivated to make changes.  -She was very interested in learning more about intermittent fasting. I discussed this with her and provided her with a book on the topic. She has read this and notes improvements in her weight loss goals with this approach.  -She has been trying to eat once per day -She has been trying to cut back beef and pork. -Her attitude is excellent.   Recent chest pain: -Cardiac workup negative, but advised that she does have cardiac risk factors and she should return to ED if she experiences similar symptoms.   All questions were encouraged and answered. RTC in 4 weeks   

## 2020-07-02 ENCOUNTER — Telehealth: Payer: Self-pay | Admitting: *Deleted

## 2020-07-02 LAB — TOXASSURE SELECT,+ANTIDEPR,UR

## 2020-07-02 NOTE — Telephone Encounter (Signed)
Urine drug screen did not result with any drugs detected.  According to note with Dr Ranell Patrick she had not taken her hydrocodone since Friday 06/25/20 so this would be appropriate.

## 2020-07-12 ENCOUNTER — Other Ambulatory Visit: Payer: Self-pay | Admitting: Physical Medicine and Rehabilitation

## 2020-07-12 MED ORDER — HYDROCODONE-ACETAMINOPHEN 10-325 MG PO TABS: 1.0000 | ORAL_TABLET | Freq: Three times a day (TID) | ORAL | 0 refills | Status: DC | PRN

## 2020-08-04 ENCOUNTER — Encounter: Payer: Self-pay | Admitting: Physical Medicine and Rehabilitation

## 2020-08-04 ENCOUNTER — Encounter
Payer: No Typology Code available for payment source | Attending: Physical Medicine and Rehabilitation | Admitting: Physical Medicine and Rehabilitation

## 2020-08-04 ENCOUNTER — Other Ambulatory Visit: Payer: Self-pay

## 2020-08-04 VITALS — BP 138/67 | HR 64 | Temp 97.9°F | Ht 65.0 in | Wt 308.0 lb

## 2020-08-04 DIAGNOSIS — Z6839 Body mass index (BMI) 39.0-39.9, adult: Secondary | ICD-10-CM | POA: Diagnosis present

## 2020-08-04 DIAGNOSIS — M170 Bilateral primary osteoarthritis of knee: Secondary | ICD-10-CM | POA: Insufficient documentation

## 2020-08-04 MED ORDER — PREDNISONE 10 MG (21) PO TBPK
ORAL_TABLET | Freq: Every day | ORAL | 0 refills | Status: DC
Start: 2020-08-04 — End: 2020-10-13

## 2020-08-04 MED ORDER — HYDROCODONE-ACETAMINOPHEN 10-325 MG PO TABS: 1.0000 | ORAL_TABLET | Freq: Four times a day (QID) | ORAL | 0 refills | Status: DC | PRN

## 2020-08-04 NOTE — Patient Instructions (Addendum)
-  Discussed foods that can assist in weight loss:  1) Eggs  2) Leafy greens  3) Salmon  4) Cruciferous vegetables  5) Lean beef and chicken breast  6) Boiled potatoes  7) Tuna  8) Beans and legumes  9) Soups  10) Cottage cheese  11) Avocados  12) Apple cider vinegar  13) Nuts  14) Whole grains  15) Chili pepper  16) Fruit- berries are some of the best  17) Grapefruit  18) Chia seeds  19) Coconut oil  20) Full-fat yogurt   Benefits of Ghee  -can be used in cooking, high smoke point  -high in fat soluble vitamins A, D, E, and K which are important for skin and vision, preventing leaky gut, strong bones  -free of lactose and casein  -contains conjugated linoleic acid, which can reduce body fat, prevent cancer, decrease inflammation, and lower blood pressure  -high in butyrate- helps support healthy insulin levels, decreases inflammation, decreases digestive problems, maintains healthy gut microbiome  -decreases pain and inflammation   -Discussed supplements that can be used:  1) Metatrim 400mg  BID 30 minutes before breakfast and dinner  2) Sphaeranthus indicus and Garcinia mangostana (combinations of these and #1 can be found in capsicum and zychrome  3) green coffee bean extract 400mg  twice per day or Irvingia (african mango) 150 to 300mg  twice per day.

## 2020-08-04 NOTE — Progress Notes (Signed)
Subjective:    Patient ID: Kelsey Santana, female    DOB: [birth year], 60 y.o.   MRN: [id number]  HPI  Kelsey Santana presents with bilateral knee pain secondary to bilteral knee OA.   Kelsey Santana had to take some prednisone for a flare of her pain. This caused her to gain weight. Both knees have been worse.   Kelsey Santana has had viscosupplementation and steroid injections and both helped but the viscosupplementation lasted longer.   Kelsey Santana has been reading the Fasting Orene Desanctis book that I shared with her. Kelsey Santana has a friend who lost 25 lbs in 3 months and stopped her sugar. Kelsey Santana recently started taking sugar again.   Kelsey Santana tried the Roobois teaand it did curb her appetite.   Walking even short distances causes her to have pain.   Kelsey Santana has ben using about 4 hydrocodone per day.   Prior history: Kelsey Santana almost fell down the stairs. Right knee is giving out more. Kelsey Santana is worried about her surgery, which is scheduled in January. Some of them needed a repeat surgery. Kelsey Santana has not taken any Norco since Friday. Kelsey Santana has 70 something left since the last prescription. Has routine UDS today. Kelsey Santana has been taking Ibuprofen.   Sleeping at night has been more. Kelsey Santana has had positive benefit from melatonin before.   Weight is currently 302 lbs. Kelsey Santana has gained since Kelsey Santana has been snacking at the desk.   Kelsey Santana has started working from home.   Prior history: Kelsey Santana presents for follow-up of her bilateral knee osteoarthritis, right> left.   Kelsey Santana has been able to walk more since last visit. Kelsey Santana met with the surgeon and is planned for surgery in September.   Kelsey Santana walked in th supermarket for about an hour.   Kelsey Santana is going to order a copy of The Fasting Lane.  Kelsey Santana is trying to overdo too much weight on her eyes.  Kelsey Santana has been able to walk better! The Etodolac has helped a lot.   Last visit: Her left knee pain is very well controlled this visit because of the steroid injection last visit. Kelsey Santana had good relief from the steroid injection  for her left knee. Her right knee had good relief initially but in the last three days Kelsey Santana has noted increasing severity of pain as well as a nodule in her right medial knee.   The Gabapentin relaxes her and allows her to sleep at night. If Kelsey Santana takes this with the hydrocodone then Kelsey Santana feels too sluggish in the morning.   Kelsey Santana lost 5 lbs since last appointment. Kelsey Santana has been trying intermittent fasting and read the book I gave her on this topic.   Kelsey Santana has had to be at home due to her difficulty ambulating. Had difficulty walking into clinic today   Prior history: Kelsey Santana continues to experience worsening of her rightknee pain. Her left knee pain has improved since Synvisc injection. Kelsey Santana has had 1 fall since last visit due to her knees buckling. Kelsey Santana feels Kelsey Santana has been trying to offload her left knee and as a result putting more force on her right knee. Kelsey Santana has been using a cane to ambulate and her ambulation has been more limited recently. At times Kelsey Santana has had to rest in bed most of the day. At other times Kelsey Santana is able to be more active. Kelsey Santana asks whether Kelsey Santana can get bilateral knee steroid injections today. Her last were over 1 year ago.   Previously Kelsey Santana had tried to be  more active, walking the park and track and using the treadmill at the [facility]. Kelsey Santana had lost 10 pounds over the previous 2 months, but has since gained 7 lbs back in the last month. Kelsey Santana said Kelsey Santana has been snacking on M&Ms. Current weight is 297lbs.Kelsey Santana has been taking Hydrocodone-Tylenol 78m-325mgthreetimes per day as needed for her knee pain. Kelsey Santana has been able to take only 2 some days and so feels ready to push her appointments out to 5 weeks.   Kelsey Santana has been taking the Vitamin D supplement weekly and has been feeling better taking it. Kelsey Santana has 2 more pills left.    Kelsey Santana may be interested in aquatic therapy once pain improves. I [place] never heard back from them.   The  Gabapentin makes her too drowsy to use in the day but Kelsey Santana does use it at night and this helps her to sleep well.   Pain Inventory Average Pain 10 Pain Right Now 7 My pain is constant, sharp, dull, stabbing and aching  In the last 24 hours, has pain interfered with the following? General activity 10 Relation with others 10 Enjoyment of life 10 What TIME of day is your pain at its worst? all day Sleep (in general) Poor  Pain is worse with: walking, bending, sitting, inactivity, standing and some activites Pain improves with: medication and injections Relief from Meds: 10      Family History  Problem Relation Age of Onset  . Alcohol abuse Mother   . Diabetes Mother   . Hyperlipidemia Mother   . Hypertension Mother   . Diabetes Brother   . Hyperlipidemia Brother   . Hypertension Brother   . Thyroid disease Neg Hx    Social History   Socioeconomic History  . Marital status: Single    Spouse name: Not on file  . Number of children: Not on file  . Years of education: Not on file  . Highest education level: Not on file  Occupational History  . Not on file  Tobacco Use  . Smoking status: Former SResearch scientist (life sciences) . Smokeless tobacco: Former USystems developer . Tobacco comment: quit 28 yrs ago  Vaping Use  . Vaping Use: Never used  Substance and Sexual Activity  . Alcohol use: No  . Drug use: No  . Sexual activity: Never  Other Topics Concern  . Not on file  Social History Narrative   epworth sleepiness scale = 10 (11/15/15)    Lives alone in a 2 story home.  Has 3 children.  Currently not working.  Has lost 6 jobs in the past 2 months due to her leg numbness and pain.     Education: 2 years of college.   Social Determinants of Health   Financial Resource Strain: Not on file  Food Insecurity: Not on file  Transportation Needs: Not on file  Physical Activity: Not on file  Stress: Not on file  Social Connections: Not on file   Past Surgical History:  Procedure Laterality Date  .  ABDOMINAL HYSTERECTOMY     complete  . COLONOSCOPY N/A 01/16/2017   Procedure: COLONOSCOPY;  Surgeon: HCarol Ada MD;  Location: [facility];  Service: Endoscopy;  Laterality: N/A;  . ESOPHAGOGASTRODUODENOSCOPY N/A 01/16/2017   Procedure: ESOPHAGOGASTRODUODENOSCOPY (EGD);  Surgeon: HCarol Ada MD;  [facility];  Service: Endoscopy;  Laterality: N/A;   Past Medical History:  Diagnosis Date  . Back pain  l 4 and l5 djd  . GERD (gastroesophageal reflux disease)   . Hypercholesteremia   . Hypertension   . Migraine   . Pre-diabetes    There were no vitals taken for this visit.  Opioid Risk Score:   Fall Risk Score:  `1  Depression screen PHQ 2/9  Depression screen [facility] 2/9 05/21/2020 04/14/2020 02/11/2020 10/29/2019 07/09/2019 03/19/2012  Decreased Interest 0 0 0 0 0 0  Down, Depressed, Hopeless 0 0 0 1 0 0  PHQ - 2 Score 0 0 0 1 0 0  Altered sleeping - - 0 - - -  Tired, decreased energy - - 0 - - -  Change in appetite - - 0 - - -  Feeling bad or failure about yourself  - - 0 - - -  Trouble concentrating - - 0 - - -  Moving slowly or fidgety/restless - - 0 - - -  Suicidal thoughts - - 0 - - -  PHQ-9 Score - - 0 - - -   Review of Systems  Constitutional: Negative.   HENT: Negative.   Eyes: Negative.   Respiratory: Negative.   Cardiovascular: Negative.   Gastrointestinal: Negative.   Endocrine: Negative.   Genitourinary: Negative.   Musculoskeletal: Positive for arthralgias, back pain, gait problem, joint swelling and neck pain.       Spasms in the knees  Skin: Positive for rash.  Neurological: Positive for weakness and numbness.       Tingling in legs  Hematological: Negative.   Psychiatric/Behavioral: Negative.        Objective:   Physical Exam Gen: no distress, normal appearing HEENT: oral mucosa pink and moist, NCAT Cardio: Reg rate Chest: normal effort, normal rate of breathing Abd: soft, non-distended Ext: no edema Skin:  intact Neuro/Musculoskeletal:AOx3. 5/5 strength in the bilateral upper extremities. Kelsey Santana has 4/5 strength throughout RLE and4-/5 throughout LLE, limited to pain.Normal gait.Tender to palpation in bilateral knees, much more so on right medial aspect, there is palpable nodule and this area is the most tender. Gait: Antalgic, requiring wheelchair today Psych: pleasant, normal affect     Assessment & Plan:  59 year old woman who presents for follow up ofbilateral chronic knee pain secondary to OA.   Bilateral knee OA: -Bilateral corticosteroid injections performed last visit, June 23rd, 2021 with good benefit in bilateral knees for 4 weeks. Continues to have benefits for left knee but right knee pain has worsened over the past three days and patient has sensation of nodule in medial knee. Left knee did really well since the last shot. Right knee has still been buckling.  --Since patient's appointment had to be pushed to 5 weeks due to my schedule, I provided refill of herHydrocodone 40m QID last week. Prior to this current flare Kelsey Santana had been able to maintain on three pills per day for some time. I will provide her refill for Gabapentin 3029mat night. Kelsey Santana may take two of these to help her sleep better. Avoid taking with norco as this combination makes her groggy in the morning. Advised that Kelsey Santana use Tylenol and Advil as needed to avoid increasing Norco dose further.Educated that opioids are not appropriate for chronic pain and the goal of this prescription will be to improve her pain and function until Kelsey Santana can received surgery. Kelsey Santana agreed that her goal is to get off opioids once able.  --Kelsey Santana has surgery scheduled in January and is a little nervous about this.  -Continue Ibuprofen as needed -Kelsey Santana will  tell me when Kelsey Santana needs a refill.  -Kelsey Santana has been trying to walk more and was able to walk 1 hr in the store! Kelsey Santana continues this! No longer needs walker or cane.! -Kelsey Santana has a bright and positive mood  today - Recommended exercise bike and aquatic therapy. Provided referral for aquatic therapy previously.  - MRI of right knee given worsening of pain and right medial aspect palpable and tender nodule, reviewed with patient.  -Diagnostic ultrasound performed in office today to assess for meniscal damage and to further assess palpable nodule as follows:  Morbid obesity,  I provided her a list of healthy foods that help prevent pain.  -Restart Roobois tea 1-3x/day -Restart turmeric daily.  --Kelsey Santana chose not to take the Orlistat. Educated regarding stopping sugar in diet and Kelsey Santana is motivated to make changes.  -Kelsey Santana was very interested in learning more about intermittent fasting. I discussed this with her and provided her with a book on the topic. Kelsey Santana has read this and notes improvements in her weight loss goals with this approach.  -Kelsey Santana has been trying to eat once per day -Kelsey Santana has been trying to cut back beef and pork. -Her attitude is excellent.  -Kelsey Santana has tried green tea extract.  -Kelsey Santana eats her first meal around at noon. Made goal to eat first meet at 12:15pm.  Discussed Benefits of Ghee  -can be used in cooking, high smoke point  -high in fat soluble vitamins A, D, E, and K which are important for skin and vision, preventing leaky gut, strong bones  -free of lactose and casein  -contains conjugated linoleic acid, which can reduce body fat, prevent cancer, decrease inflammation, and lower blood pressure  -high in butyrate- helps support healthy insulin levels, decreases inflammation, decreases digestive problems, maintains healthy gut microbiome  -decreases pain and inflammation  Discussed following foods that help with weight loss:   1) Eggs  2) Leafy greens  3) Salmon  4) Cruciferous vegetables  5) Lean beef and chicken breast  6) Boiled potatoes  7) Tuna  8) Beans and legumes  9) Soups  10) Cottage cheese  11) Avocados  12) Apple cider vinegar  13) Nuts  14) Whole  grains  15) Chili pepper  16) Fruit- berries are some of the best  17) Grapefruit  18) Chia seeds  19) Coconut oil  20) Full-fat yogurt  -Discussed supplements that can be used:  1) Metatrim 469m BID 30 minutes before breakfast and dinner  2) Sphaeranthus indicus and Garcinia mangostana (combinations of these and #1 can be found in capsicum and zychrome  3) green coffee bean extract 4066mtwice per day or Irvingia (african mango) 150 to 30061mwice per day.  Recent chest pain: -Cardiac workup negative, but advised that Kelsey Santana does have cardiac risk factors and Kelsey Santana should return to ED if Kelsey Santana experiences similar symptoms.   All questions were encouraged and answered. RTC in 4 weeks

## 2020-08-10 MED ORDER — HYDROCODONE-ACETAMINOPHEN 10-325 MG PO TABS: 1.0000 | ORAL_TABLET | Freq: Four times a day (QID) | ORAL | 0 refills | Status: DC | PRN

## 2020-08-10 NOTE — Addendum Note (Signed)
Addended by: Izora Ribas on: 08/10/2020 02:25 PM   Modules accepted: Orders

## 2020-08-26 ENCOUNTER — Ambulatory Visit (HOSPITAL_COMMUNITY)
Admission: RE | Admit: 2020-08-26 | Payer: No Typology Code available for payment source | Source: Home / Self Care | Admitting: Orthopedic Surgery

## 2020-08-26 ENCOUNTER — Encounter (HOSPITAL_COMMUNITY): Admission: RE | Payer: Self-pay | Source: Home / Self Care

## 2020-08-26 SURGERY — ARTHROPLASTY, KNEE, TOTAL
Anesthesia: Spinal | Site: Knee | Laterality: Right

## 2020-09-03 ENCOUNTER — Encounter
Payer: No Typology Code available for payment source | Attending: Physical Medicine and Rehabilitation | Admitting: Physical Medicine and Rehabilitation

## 2020-09-03 DIAGNOSIS — M170 Bilateral primary osteoarthritis of knee: Secondary | ICD-10-CM | POA: Insufficient documentation

## 2020-09-17 ENCOUNTER — Encounter (HOSPITAL_BASED_OUTPATIENT_CLINIC_OR_DEPARTMENT_OTHER): Payer: No Typology Code available for payment source | Admitting: Physical Medicine and Rehabilitation

## 2020-09-17 ENCOUNTER — Other Ambulatory Visit: Payer: Self-pay

## 2020-09-17 ENCOUNTER — Encounter: Payer: Self-pay | Admitting: Physical Medicine and Rehabilitation

## 2020-09-17 VITALS — BP 135/83 | HR 67 | Temp 98.2°F | Ht 65.0 in | Wt 309.2 lb

## 2020-09-17 DIAGNOSIS — M170 Bilateral primary osteoarthritis of knee: Secondary | ICD-10-CM | POA: Diagnosis not present

## 2020-09-17 MED ORDER — HYDROCODONE-ACETAMINOPHEN 10-325 MG PO TABS: 1.0000 | ORAL_TABLET | Freq: Four times a day (QID) | ORAL | 0 refills | Status: DC | PRN

## 2020-09-17 NOTE — Progress Notes (Signed)
Knee injection, bilateral  Indication:Bilateral Knee pain not relieved by medication management and other conservative care.  Informed consent was obtained after describing risks and benefits of the procedure with the patient, this includes bleeding, bruising, infection and medication side effects. The patient wishes to proceed and has given written consent. The patient was placed in a recumbent position. The medial aspect of the knee was marked and prepped with Betadine and alcohol. It was then entered with a 25-gauge 1-1/2 inch needle and 1 mL of 1% lidocaine was injected into the skin and subcutaneous tissue. Then another similar needle was inserted into the knee joint. After negative draw back for blood, a solution containing one ML of 6mg  per mL of celestone and 3 mL of 1% lidocaine were injected in bilateral knees OA. The patient tolerated the procedure well. Post procedure instructions were given.

## 2020-09-17 NOTE — Addendum Note (Signed)
Addended by: Izora Ribas on: 09/17/2020 03:28 PM   Modules accepted: Orders

## 2020-09-20 ENCOUNTER — Encounter: Payer: Self-pay | Admitting: Physical Medicine and Rehabilitation

## 2020-10-13 ENCOUNTER — Encounter
Payer: No Typology Code available for payment source | Attending: Physical Medicine and Rehabilitation | Admitting: Physical Medicine and Rehabilitation

## 2020-10-13 ENCOUNTER — Other Ambulatory Visit: Payer: Self-pay

## 2020-10-13 ENCOUNTER — Encounter: Payer: Self-pay | Admitting: Physical Medicine and Rehabilitation

## 2020-10-13 VITALS — BP 170/87 | HR 56 | Ht 65.0 in | Wt 300.0 lb

## 2020-10-13 DIAGNOSIS — R7303 Prediabetes: Secondary | ICD-10-CM | POA: Insufficient documentation

## 2020-10-13 DIAGNOSIS — M170 Bilateral primary osteoarthritis of knee: Secondary | ICD-10-CM | POA: Diagnosis not present

## 2020-10-13 MED ORDER — HYDROCODONE-ACETAMINOPHEN 10-325 MG PO TABS: 1.0000 | ORAL_TABLET | Freq: Four times a day (QID) | ORAL | 0 refills | Status: DC | PRN

## 2020-10-13 MED ORDER — PREDNISONE 10 MG (21) PO TBPK
ORAL_TABLET | Freq: Every day | ORAL | 0 refills | Status: DC
Start: 2020-10-13 — End: 2022-02-21

## 2020-10-13 MED ORDER — METFORMIN HCL ER (OSM) 500 MG PO TB24: 500.0000 mg | ORAL_TABLET | Freq: Every day | ORAL | 1 refills | Status: DC

## 2020-10-13 NOTE — Progress Notes (Signed)
Subjective:    Patient ID: Kelsey Santana, female    DOB: 1961-06-02, 60 y.o.   MRN: [id number]  HPI  Due to national recommendations of social distancing because of COVID 15, an audio/video tele-health visit is felt to be the most appropriate encounter for this patient at this time. See MyChart message from today for the patient's consent to a tele-health encounter with Kelsey Santana. This is a follow up tele-visit via MyChart Video Visit. The patient is at home. MD is at office.   Kelsey Santana presents with bilateral knee pain secondary to osteoarthritis.   1) Bilateral knee OA -Her pain continues to be severe -She is considering knee surgery -She has never tried blue emu oil -She is taking her Norco 3-4 times per day as needed. -Pain is greatly limiting quality of life.  -She had to take some prednisone for a flare of her pain. This caused her to gain weight. -She has had viscosupplementation and steroid injections and both helped but the viscosupplementation lasted longer.  -Walking even short distances causes her to have pain.   2) Prediabetes: -She has been reading the Kelsey Kelsey Santana book that I shared with her. She has a friend who lost 25 lbs in 3 months after stopping sugar and she was encouraged by this. -She tried the Roobois tea and it did curb her appetite.  -She has lost a few pounds since last visit.  -She has never tried Metformin.   Prior history: She almost fell down the stairs. Right knee is giving out more. She is worried about her surgery, which is scheduled in January. Some of them needed a repeat surgery. She has not taken any Norco since Friday. She has 70 something left since the last prescription. Has routine UDS today. She has been taking Ibuprofen.   Sleeping at night has been more. She has had positive benefit from melatonin before.   Weight is currently 302 lbs. She has gained since she has been snacking at the desk.    She has started working from home.   Prior history: Kelsey Santana presents for follow-up of her bilateral knee osteoarthritis, right> left.   She has been able to walk more since last visit. She met with the surgeon and is planned for surgery in September.   She walked in th supermarket for about an hour.   She is going to order a copy of The Kelsey Santana.  She is trying to overdo too much weight on her eyes.  She has been able to walk better! The Etodolac has helped a lot.   Last visit: Her left knee pain is very well controlled this visit because of the steroid injection last visit. She had good relief from the steroid injection for her left knee. Her right knee had good relief initially but in the last three days she has noted increasing severity of pain as well as a nodule in her right medial knee.   The Gabapentin relaxes her and allows her to sleep at night. If she takes this with the hydrocodone then she feels too sluggish in the morning.   She lost 5 lbs since last appointment. She has been trying intermittent Kelsey and read the book I gave her on this topic.   She has had to be at home due to her difficulty ambulating. Had difficulty walking into clinic today   Prior history: She continues to experience worsening of her rightknee pain. Her left knee  pain has improved since Synvisc injection. She has had 1 fall since last visit due to her knees buckling. She feels she has been trying to offload her left knee and as a result putting more force on her right knee. She has been using a cane to ambulate and her ambulation has been more limited recently. At times she has had to rest in bed most of the day. At other times she is able to be more active. She asks whether she can get bilateral knee steroid injections today. Her last were over 1 year ago.   Previously she had tried to be more active, walking the park and track and using the treadmill at the [facility]. She had lost 10 pounds  over the previous 2 months, but has since gained 7 lbs back in the last month. She said she has been snacking on M&Ms. Current weight is 297lbs.She has been taking Hydrocodone-Tylenol 67m-325mgthreetimes per day as needed for her knee pain. She has been able to take only 2 some days and so feels ready to push her appointments out to 5 weeks.   She has been taking the Vitamin D supplement weekly and has been feeling better taking it. She has 2 more pills left.    She may be interested in aquatic therapy once pain improves. I [place] gave [place] never [place] from [place].   The Gabapentin makes her too drowsy to use in the day but she does use it at night and this helps her to sleep well.   Pain Inventory Average Pain 4 Pain Right Now 4 My pain is constant, sharp, dull, stabbing and aching  In the last 24 hours, has pain interfered with the following? General activity 10 Relation with others 8 Enjoyment of life 10 What TIME of day is your pain at its worst? all day Sleep (in general) Poor  Pain is worse with: walking, bending, sitting, inactivity, standing and some activites Pain improves with: rest, medication and injections Relief from Meds: 10      Family History  Problem Relation Age of Onset  . Alcohol abuse Mother   . Diabetes Mother   . Hyperlipidemia Mother   . Hypertension Mother   . Diabetes Brother   . Hyperlipidemia Brother   . Hypertension Brother   . Thyroid disease Neg Hx    Social History   Socioeconomic History  . Marital status: Single    Spouse name: Not on file  . Number of children: Not on file  . Years of education: Not on file  . Highest education level: Not on file  Occupational History  . Not on file  Tobacco Use  . Smoking status: Former SResearch scientist (life sciences) . Smokeless tobacco: Former USystems developer . Tobacco comment: quit 28 yrs ago  Vaping Use  . Vaping Use: Never used  Substance and Sexual Activity   . Alcohol use: No  . Drug use: No  . Sexual activity: Never  Other Topics Concern  . Not on file  Social History Narrative   epworth sleepiness scale = 10 (11/15/15)    Lives alone in a 2 story home.  Has 3 children.  Currently not working.  Has lost 6 jobs in the past 2 months due to her leg numbness and pain.     Education: 2 years of college.   Social Determinants of Health   Financial Resource Strain: Not on file  Food Insecurity: Not  on file  Transportation Needs: Not on file  Physical Activity: Not on file  Stress: Not on file  Social Connections: Not on file   Past Surgical History:  Procedure Laterality Date  . ABDOMINAL HYSTERECTOMY     complete  . COLONOSCOPY N/A 01/16/2017   Procedure: COLONOSCOPY;  Surgeon: Carol Ada, MD;  Location: [facility];  Service: Endoscopy;  Laterality: N/A;  . ESOPHAGOGASTRODUODENOSCOPY N/A 01/16/2017   Procedure: ESOPHAGOGASTRODUODENOSCOPY (EGD);  Surgeon: Carol Ada, MD;  Location: [facility];  Service: Endoscopy;  Laterality: N/A;   Past Medical History:  Diagnosis Date  . 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 GERD (gastroesophageal reflux disease)   . Hypercholesteremia   . Hypertension   . Migraine   . Pre-diabetes    BP (!) 170/87   Pulse (!) 56   Ht 5' 5"  (1.651 m)   Wt 300 lb (136.1 kg)   BMI 49.92 kg/m   Opioid Risk Score:   Fall Risk Score:  `1  Depression screen PHQ 2/9  Depression screen [facility] 2/9 09/17/2020 09/17/2020 05/21/2020 04/14/2020 02/11/2020 10/29/2019 07/09/2019  Decreased Interest 1 0 0 0 0 0 0  Down, Depressed, Hopeless 1 0 0 0 0 1 0  PHQ - 2 Score 2 0 0 0 0 1 0  Altered sleeping - - - - 0 - -  Tired, decreased energy - - - - 0 - -  Change in appetite - - - - 0 - -  Feeling bad or failure about yourself  - - - - 0 - -  Trouble concentrating - - - - 0 - -  Moving slowly or fidgety/restless - - - - 0 - -  Suicidal thoughts - - - - 0 - -  PHQ-9 Score - - - - 0 - -   Review of Systems   Constitutional: Negative.   HENT: Negative.   Eyes: Negative.   Respiratory: Negative.   Cardiovascular: Negative.   Gastrointestinal: Negative.   Endocrine: Negative.   Genitourinary: Negative.   Musculoskeletal: Positive for arthralgias, back pain, gait problem, joint swelling and neck pain.       Spasms in the knees  Skin: Positive for rash.  Neurological: Positive for weakness and numbness.       Tingling in legs  Hematological: Negative.   Psychiatric/Behavioral: Negative.        Objective:   Physical Exam Not performed as patient was seen via MyChart Video Visit.      Assessment & Plan:  60 year old woman who presents for follow-up ofbilateral chronic knee pain secondary to bilateral knee OA.   1) Bilateral knee OA: -Bilateral corticosteroid injections performed in January with good benefit in bilateral knees for 4 weeks.  -Refilled Norco for use up to 4 times per day, encouraged to try to use no more than three times per day. -Continue Gabapentin 324m at night. She may take two of these to help her sleep better. Avoid taking with norco as this combination makes her groggy in the morning. - Advised that she use Tylenol and Advil as needed to avoid increasing Norco dose further. -Educated that opioids are not appropriate for chronic pain and the goal of this prescription will be to improve her pain and function until she can received surgery. She agreed that her goal is to get off opioids once able.  --She has contacted her surgeon and would like to move forward with this option.  -Continue Ibuprofen as needed -  Encouraged continued weight loss and walking.  -She has a bright and positive mood today - Recommended exercise bike and aquatic therapy. Provided referral for aquatic therapy previously.  - MRI of right knee ordered given worsening of pain and right medial aspect palpable and tender nodule, reviewed with patient.   2) Morbid obesity,  I provided her a list of  healthy foods that help prevent pain.  -Restart Roobois tea 1-3x/day -Restart turmeric daily.  --She chose not to take the Orlistat. Educated regarding stopping sugar in diet and she is motivated to make changes.  -She was very interested in learning more about intermittent Kelsey. I discussed this with her and provided her with a book on the topic. She has read this and notes improvements in her weight loss goals with this approach.  -She has been trying to eat once per day -She has been trying to cut back beef and pork. -Her attitude is excellent.  -She has tried green tea extract.  -She eats her first meal around at noon. Made goal to eat first meal at 12:15pm.  Discussed Benefits of Ghee  -can be used in cooking, high smoke point  -high in fat soluble vitamins A, D, E, and K which are important for skin and vision, preventing leaky gut, strong bones  -free of lactose and casein  -contains conjugated linoleic acid, which can reduce body fat, prevent cancer, decrease inflammation, and lower blood pressure  -high in butyrate- helps support healthy insulin levels, decreases inflammation, decreases digestive problems, maintains healthy gut microbiome  -decreases pain and inflammation  Discussed following foods that help with weight loss:   1) Eggs  2) Leafy greens  3) Salmon  4) Cruciferous vegetables  5) Lean beef and chicken breast  6) Boiled potatoes  7) Tuna  8) Beans and legumes  9) Soups  10) Cottage cheese  11) Avocados  12) Apple cider vinegar  13) Nuts  14) Whole grains  15) Chili pepper  16) Fruit- berries are some of the best  17) Grapefruit  18) Chia seeds  19) Coconut oil  20) Full-fat yogurt  -Discussed supplements that can be used:  1) Metatrim 4107m BID 30 minutes before breakfast and dinner  2) Sphaeranthus indicus and Garcinia mangostana (combinations of these and #1 can be found in capsicum and zychrome  3) green coffee bean extract 4027mtwice per  day or Irvingia (african mango) 150 to 30053mwice per day.  3) Recent chest pain: -Cardiac workup negative, but advised that she does have cardiac risk factors and she should return to ED if she experiences similar symptoms.   4) Prediabetes: --Continue to avoid sugar -Start Metformin 500m44mily -Will check creatinine when she follows with me in April. Most recent value reviewed and stable -Discussed research regarding anti-aging benefits of Metformin, and that it can also assist in weight loss -Discussed potential side effects to look out for.     All questions were encouraged and answered. RTC in 4 weeks

## 2020-10-15 MED ORDER — METFORMIN HCL 500 MG PO TABS: 500.0000 mg | ORAL_TABLET | Freq: Every day | ORAL | 1 refills | Status: DC

## 2020-10-15 NOTE — Addendum Note (Signed)
Addended by: Izora Ribas on: 10/15/2020 09:17 AM   Modules accepted: Orders

## 2020-10-20 ENCOUNTER — Ambulatory Visit: Payer: No Typology Code available for payment source | Admitting: Physical Medicine and Rehabilitation

## 2020-10-28 ENCOUNTER — Other Ambulatory Visit: Payer: Self-pay

## 2020-11-12 ENCOUNTER — Encounter
Payer: No Typology Code available for payment source | Attending: Physical Medicine and Rehabilitation | Admitting: Physical Medicine and Rehabilitation

## 2020-11-12 ENCOUNTER — Encounter: Payer: Self-pay | Admitting: Physical Medicine and Rehabilitation

## 2020-11-12 ENCOUNTER — Other Ambulatory Visit: Payer: Self-pay

## 2020-11-12 VITALS — BP 160/76 | HR 58 | Temp 98.1°F | Ht 65.0 in | Wt 311.0 lb

## 2020-11-12 DIAGNOSIS — E559 Vitamin D deficiency, unspecified: Secondary | ICD-10-CM

## 2020-11-12 DIAGNOSIS — Z6839 Body mass index (BMI) 39.0-39.9, adult: Secondary | ICD-10-CM

## 2020-11-12 DIAGNOSIS — R7303 Prediabetes: Secondary | ICD-10-CM

## 2020-11-12 DIAGNOSIS — M170 Bilateral primary osteoarthritis of knee: Secondary | ICD-10-CM

## 2020-11-12 MED ORDER — HYDROCODONE-ACETAMINOPHEN 10-325 MG PO TABS: 1.0000 | ORAL_TABLET | Freq: Three times a day (TID) | ORAL | 0 refills | Status: DC | PRN

## 2020-11-12 MED ORDER — METFORMIN HCL 500 MG PO TABS: 500.0000 mg | ORAL_TABLET | Freq: Two times a day (BID) | ORAL | 1 refills | Status: DC

## 2020-11-12 NOTE — Progress Notes (Signed)
Subjective:    Patient ID: Kelsey Santana, female    DOB: March 10, 1961, 60 y.o.   MRN: [id number]  HPI  Kelsey Santana presents with bilateral knee pain secondary to bilteral knee OA.   1) Obesity:  -she has been unable to be active.  -she has stairs at home and once she has ascended the stairs, it is harder for her to go back downstairs outside.  -she has been eating potato chips -she does really like the Rooibos tea and drinks one per day -She has been reading the Fasting Orene Desanctis book that I shared with her. She has a friend who lost 25 lbs in 3 months and stopped her sugar. She recently started taking sugar again.   2) Bilateral knee pain: -still having some right knee pain but left knee pain has been very well controlled since injection -she feels she can wean Norco down to three tablets per day -She has had viscosupplementation and steroid injections and both helped but the viscosupplementation lasted longer.  -she continues to wear knee sleeve on right side.                                                                                                      -She had to take some prednisone for a flare of her pain. This caused her to gain weight. Both knees have been worse.   Prior history:  Walking even short distances causes her to have pain.   She has ben using about 4 hydrocodone per day.   Prior history: She almost fell down the stairs. Right knee is giving out more. She is worried about her surgery, which is scheduled in January. Some of them needed a repeat surgery. She has not taken any Norco since Friday. She has 70 something left since the last prescription. Has routine UDS today. She has been taking Ibuprofen.   Sleeping at night has been more. She has had positive benefit from melatonin before.   Weight is currently 302 lbs. She has gained since she has been snacking at the desk.   She has started working from home.   Prior history: Kelsey Santana presents for follow-up of  her bilateral knee osteoarthritis, right> left.   She has been able to walk more since last visit. She met with the surgeon and is planned for surgery in September.   She walked in th supermarket for about an hour.   She is going to order a copy of [employer] Fasting [employer].  She is trying to overdo too much weight on her eyes.  She has been able to walk better! The Etodolac has helped a lot.   Last visit: Her left knee pain is very well controlled this visit because of the steroid injection last visit. She had good relief from the steroid injection for her left knee. Her right knee had good relief initially but in the last three days she has noted increasing severity of pain as well as a nodule in her right medial knee.   The Gabapentin relaxes her and allows her  to sleep at night. If she takes this with the hydrocodone then she feels too sluggish in the morning.   She lost 5 lbs since last appointment. She has been trying intermittent fasting and read the book I gave her on this topic.   She has had to be at home due to her difficulty ambulating. Had difficulty walking into clinic today   Prior history: She continues to experience worsening of her rightknee pain. Her left knee pain has improved since Synvisc injection. She has had 1 fall since last visit due to her knees buckling. She feels she has been trying to offload her left knee and as a result putting more force on her right knee. She has been using a cane to ambulate and her ambulation has been more limited recently. At times she has had to rest in bed most of the day. At other times she is able to be more active. She asks whether she can get bilateral knee steroid injections today. Her last were over 1 year ago.   Previously she had tried to be more active, walking the park and track and using the treadmill at the [facility]. She had lost 10 pounds over the previous 2 months, but has since gained 7 lbs back in the last month. She said she has  been snacking on M&Ms. Current weight is 297lbs.She has been taking Hydrocodone-Tylenol 49m-325mgthreetimes per day as needed for her knee pain. She has been able to take only 2 some days and so feels ready to push her appointments out to 5 weeks.   She has been taking the Vitamin D supplement weekly and has been feeling better taking it. She has 2 more pills left.    She may be interested in aquatic therapy once pain improves. I previously gave her [place] never heard [place] from them.   The Gabapentin makes her too drowsy to use in the day but she does use it at night and this helps her to sleep well.   Pain Inventory Average Pain 10 Pain Right Now 1 My pain is constant, sharp, stabbing and aching  In the last 24 hours, has pain interfered with the following? General activity 2 Relation with others 2 Enjoyment of life 2 What TIME of day is your pain at its worst? all day Sleep (in general) Poor  Pain is worse with: walking, bending, sitting, inactivity, standing and some activites Pain improves with: medication and injections Relief from Meds: 10      Family History  Problem Relation Age of Onset   Alcohol abuse Mother    Diabetes Mother    Hyperlipidemia Mother    Hypertension Mother    Diabetes Brother    Hyperlipidemia Brother    Hypertension Brother    Thyroid disease Neg Hx    Social History   Socioeconomic History   Marital status: Single    Spouse name: Not on file   Number of children: Not on file   Years of education: Not on file   Highest education level: Not on file  Occupational History   Not on file  Tobacco Use   Smoking status: Former Smoker   Smokeless tobacco: 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  Has 3 children.  Currently not working.  Has lost 6 jobs in the past 2 months due to her leg numbness and pain.     Education: 2 years of college.   Social Determinants of Health   Financial Resource Strain: Not on file  Food Insecurity: Not on file  Transportation Needs: Not on file  Physical Activity: Not on file  Stress: Not on file  Social Connections: Not on file   Past Surgical History:  Procedure Laterality Date   ABDOMINAL HYSTERECTOMY     complete   COLONOSCOPY N/A 01/16/2017   Procedure: COLONOSCOPY;  Surgeon: Carol Ada, MD;  Location: [facility];  Service: Endoscopy;  Laterality: N/A;   ESOPHAGOGASTRODUODENOSCOPY N/A 01/16/2017   Procedure: ESOPHAGOGASTRODUODENOSCOPY (EGD);  Surgeon: Carol Ada, MD;  Location: [facility];  Service: Endoscopy;  Laterality: N/A;   Past Medical History:  Diagnosis Date   Back pain    l 4 and l5 djd   GERD (gastroesophageal reflux disease)    Hypercholesteremia    Hypertension    Migraine    Pre-diabetes    BP (!) 160/76    Pulse (!) 58    Temp 98.1 F (36.7 C)    Ht 5' 5" (1.651 m)    Wt (!) 311 lb (141.1 kg)    SpO2 96%    BMI 51.75 kg/m   Opioid Risk Score:   Fall Risk Score:  `1  Depression screen PHQ 2/9  Depression screen [facility] 2/9 09/17/2020 09/17/2020 05/21/2020 04/14/2020 02/11/2020 10/29/2019 07/09/2019  Decreased Interest 1 0 0 0 0 0 0  Down, Depressed, Hopeless 1 0 0 0 0 1 0  PHQ - 2 Score 2 0 0 0 0 1 0  Altered sleeping - - - - 0 - -  Tired, decreased energy - - - - 0 - -  Change in appetite - - - - 0 - -  Feeling bad or failure about yourself  - - - - 0 - -  Trouble concentrating - - - - 0 - -  Moving slowly or fidgety/restless - - - - 0 - -  Suicidal thoughts - - - - 0 - -  PHQ-9 Score - - - - 0 - -   Review of Systems  Constitutional: Negative.   HENT: Negative.   Eyes: Negative.    Respiratory: Negative.   Cardiovascular: Negative.   Gastrointestinal: Negative.   Endocrine: Negative.   Genitourinary: Negative.   Musculoskeletal: Positive for arthralgias, back pain, gait problem, joint swelling and neck pain.       Spasms in the knees  Skin: Positive for rash.  Neurological: Positive for weakness and numbness.       Tingling in legs  Hematological: Negative.   Psychiatric/Behavioral: Negative.   All other systems reviewed and are negative.      Objective:   Physical Exam Gen: no distress, normal appearing HEENT: oral mucosa pink and moist, NCAT Cardio: Reg rate Chest: normal effort, normal rate of breathing Abd: soft, non-distended Ext: no edema Psych: pleasant, normal affect Skin: intact Neuro/Musculoskeletal:AOx3. 5/5 strength in the bilateral upper extremities. She has 4/5 strength throughout RLE and4-/5 throughout LLE, limited to pain.Normal gait.Tender to palpation in bilateral knees, much more so on right medial aspect, there is palpable nodule and this area is the most tender. Gait: Antalgic,  requiring wheelchair today Psych: pleasant, normal affect     Assessment & Plan:  60 year old woman who presents for follow-up ofbilateral chronic knee pain secondary to OA.   Bilateral knee OA: -Knee pain has improved since last injection.  -continue knee brace for right knee -decrease Norco to TID PRN.  -Continue Gabapentin 362m at night. She may take two of these to help her sleep better. Avoid taking with norco as this combination makes her groggy in the morning.  -Advised that she use Tylenol and Advil as needed to avoid increasing Norco dose further. -Educated that opioids are not appropriate for chronic pain and the goal of this prescription will be to improve her pain and function until she can received surgery. She agreed that her goal is to get off opioids once able.  -She would like knee replacement once she is able to lose weight.   -Continue Ibuprofen as needed -She has been trying to walk more and was able to walk 1 hr in the store! She continues this! No longer needs walker or cane.! -She has a bright and positive mood today - Recommended exercise bike and aquatic therapy. Provided referral for aquatic therapy previously.  - MRI of right knee given worsening of pain and right medial aspect palpable and tender nodule, reviewed with patient.   Morbid obesity,  I provided her a list of healthy foods that help prevent pain.  -Continue Ro[place]ois tea 1-3x/day -Restart turmeric daily.  --She chose not to take the Orlistat. Educated regarding stopping sugar in diet and she is motivated to make changes.  -She was very interested in learning more about intermittent fasting. I discussed this with her and provided her with a book on the topic. She has read this and notes improvements in her weight loss goals with this approach.  -She has been trying to eat once per day -She has been trying to cut back beef and pork. -Her attitude is excellent.  -She has tried green tea extract.  -She eats her first meal around at noon. Made goal to eat first meet at 12:15pm.  -Continue metformin Discussed Benefits of Ghee  -can be used in cooking, high smoke point  -high in fat soluble vitamins A, D, E, and K which are important for skin and vision, preventing leaky gut, strong bones  -free of lactose and casein  -contains conjugated linoleic acid, which can reduce body fat, prevent cancer, decrease inflammation, and lower blood pressure  -high in butyrate- helps support healthy insulin levels, decreases inflammation, decreases digestive problems, maintains healthy gut microbiome  -decreases pain and inflammation  Discussed following foods that help with weight loss:   1) Eggs  2) Leafy greens  3) Salmon  4) Cruciferous vegetables  5) Lean beef and chicken breast  6) Boiled potatoes  7) Tuna  8) Beans and legumes  9) Soups  10)  Cottage cheese  11) Avocados  12) Apple cider vinegar  13) Nuts  14) Whole grains  15) Chili pepper  16) Fruit- berries are some of the best  17) Grapefruit  18) Chia seeds  19) Coconut oil  20) Full-fat yogurt  -Discussed supplements that can be used:  1) Metatrim 409mBID 30 minutes before breakfast and dinner  2) Sphaeranthus indicus and Garcinia mangostana (combinations of these and #1 can be found in capsicum and zychrome  3) green coffee bean extract 40047mwice per day or Irvingia (african mango) 150 to 300m58mice per day.  Recent chest pain: -  Cardiac workup negative, but advised that she does have cardiac risk factors and she should return to ED if she experiences similar symptoms.   Prediabetes: -increase metformin to 572m BID. Will also help with weight loss. Discussed its benefits in maintaining cognition and antiaging.  -check hgbA1c today  Vitamin D deficiency: Check vitamin D level today.   All questions were encouraged and answered. RTC in 4 weeks

## 2020-11-12 NOTE — Patient Instructions (Signed)
-  Advised regarding healthy foods that can help lower blood pressure and provided with a list: 1) citrus foods 2) salmon and other fatty fish 3) swiss chard (leafy green) 4) pumpkin seeds 5) Beans and lentils 6) Berries 7) Amaranth (whole grain, can be cooked similarly to rice and oats) 8) Pistachios 9) Carrots 10) Celery 11) Tomatoes 12) Broccoli 13) Greek yogurt 14) Herbs and spices: Celery seed, cilantro, saffron, lemongrass, black cumin, ginseng, cinnamon, cardamom, sweet basil, and ginger 15) Chia and flax seeds 16) Beets 17) spinach

## 2020-11-29 ENCOUNTER — Other Ambulatory Visit (HOSPITAL_COMMUNITY): Payer: No Typology Code available for payment source

## 2020-12-01 ENCOUNTER — Encounter: Payer: Self-pay | Admitting: Physical Medicine and Rehabilitation

## 2020-12-01 ENCOUNTER — Other Ambulatory Visit: Payer: Self-pay | Admitting: Physical Medicine and Rehabilitation

## 2020-12-01 DIAGNOSIS — N179 Acute kidney failure, unspecified: Secondary | ICD-10-CM

## 2020-12-02 ENCOUNTER — Ambulatory Visit: Admit: 2020-12-02 | Payer: No Typology Code available for payment source | Admitting: Orthopedic Surgery

## 2020-12-02 SURGERY — ARTHROPLASTY, KNEE, TOTAL
Anesthesia: Spinal | Site: Knee | Laterality: Right

## 2020-12-16 ENCOUNTER — Inpatient Hospital Stay: Payer: No Typology Code available for payment source | Admitting: Orthopedic Surgery

## 2020-12-20 ENCOUNTER — Telehealth: Payer: Self-pay | Admitting: Endocrinology

## 2020-12-20 NOTE — Telephone Encounter (Signed)
Mari from [employer] attorney office  Will be  faxing over paper worik to [facility] records to their office for patient.   Fax number was given

## 2020-12-21 NOTE — Telephone Encounter (Signed)
Paperwork received. Request faxed to Medical records at [phone]

## 2020-12-27 ENCOUNTER — Encounter
Payer: No Typology Code available for payment source | Attending: Physical Medicine and Rehabilitation | Admitting: Physical Medicine and Rehabilitation

## 2020-12-27 ENCOUNTER — Other Ambulatory Visit: Payer: Self-pay

## 2020-12-27 ENCOUNTER — Encounter: Payer: Self-pay | Admitting: Physical Medicine and Rehabilitation

## 2020-12-27 VITALS — BP 120/78 | HR 61 | Temp 98.0°F | Ht 65.0 in | Wt 307.0 lb

## 2020-12-27 DIAGNOSIS — E1169 Type 2 diabetes mellitus with other specified complication: Secondary | ICD-10-CM | POA: Insufficient documentation

## 2020-12-27 DIAGNOSIS — G894 Chronic pain syndrome: Secondary | ICD-10-CM | POA: Insufficient documentation

## 2020-12-27 DIAGNOSIS — Z79891 Long term (current) use of opiate analgesic: Secondary | ICD-10-CM | POA: Insufficient documentation

## 2020-12-27 DIAGNOSIS — Z5181 Encounter for therapeutic drug level monitoring: Secondary | ICD-10-CM | POA: Insufficient documentation

## 2020-12-27 MED ORDER — HYDROCODONE-ACETAMINOPHEN 10-325 MG PO TABS: 1.0000 | ORAL_TABLET | Freq: Four times a day (QID) | ORAL | 0 refills | Status: DC | PRN

## 2020-12-27 MED ORDER — TOPIRAMATE 25 MG PO TABS: 25.0000 mg | ORAL_TABLET | Freq: Two times a day (BID) | ORAL | 3 refills | Status: DC

## 2020-12-27 NOTE — Patient Instructions (Signed)
Discussed following foods that help with weight loss:   1) Eggs  2) Leafy greens  3) Salmon  4) Cruciferous vegetables  5) Lean beef and chicken breast  6) Boiled potatoes  7) Tuna  8) Beans and legumes  9) Soups  10) Cottage cheese  11) Avocados  12) Apple cider vinegar  13) Nuts  14) Whole grains  15) Chili pepper  16) Fruit- berries are some of the best  17) Grapefruit  18) Chia seeds  19) Coconut oil  20) Full-fat yogurt

## 2020-12-27 NOTE — Progress Notes (Signed)
Subjective:    Patient ID: Kelsey Santana, female    DOB: [birth year], 60 y.o.   MRN: [id number]  HPI  Kelsey Santana presents for follow-up of bilateral knee pain secondary to bilteral knee OA.   1) Obesity:  -she has been unable to be active.  -she has stairs at home and once she has ascended the stairs, it is harder for her to go back downstairs outside.  -she has been eating potato chips -she does really like the Rooibos tea and drinks one per day -She has been reading the Fasting Orene Desanctis book that I shared with her. She has a friend who lost 25 lbs in 3 months and stopped her sugar. She recently started taking sugar again.  -she lost 4 lbs   2) Bilateral knee pain: -still having some right knee pain but left knee pain has been very well controlled since injection -she feels she can wean Norco down to three tablets per day -She has had viscosupplementation and steroid injections and both helped but the viscosupplementation lasted longer.  -she continues to wear knee sleeve on right side.                                                                                                       -She had to take some prednisone for a flare of her pain. This caused her to gain weight. Both knees have been worse.  -she has been trying to do more walking and would prefer to increase Norco to 4 times per day to help her do this.  -pain is overall better and allowing her to be more functional.   Prior history:  Walking even short distances causes her to have pain.   She has ben using about 4 hydrocodone per day.   Prior history: She almost fell down the stairs. Right knee is giving out more. She is worried about her surgery, which is scheduled in January. Some of them needed a repeat surgery. She has not taken any Norco since Friday. She has 70 something left since the last prescription. Has routine UDS today. She has been taking Ibuprofen.   Sleeping at night has been more. She has had positive  benefit from melatonin before.   Weight is currently 302 lbs. She has gained since she has been snacking at the desk.   She has started working from home.   Prior history: Kelsey Santana presents for follow-up of her bilateral knee osteoarthritis, right> left.   She has been able to walk more since last visit. She met with the surgeon and is planned for surgery in September.   She walked in th supermarket for about an hour.   She is going to order a copy of [employer]asting [employer].  She is trying to overdo too much weight on her eyes.  She has been able to walk better! The Etodolac has helped a lot.   Last visit: Her left knee pain is very well controlled this visit because of the steroid injection last visit. She had good relief from the steroid  injection for her left knee. Her right knee had good relief initially but in the last three days she has noted increasing severity of pain as well as a nodule in her right medial knee.   The Gabapentin relaxes her and allows her to sleep at night. If she takes this with the hydrocodone then she feels too sluggish in the morning.   She lost 5 lbs since last appointment. She has been trying intermittent fasting and read the book I gave her on this topic.   She has had to be at home due to her difficulty ambulating. Had difficulty walking into clinic today   Prior history: She continues to experience worsening of her rightknee pain. Her left knee pain has improved since Synvisc injection. She has had 1 fall since last visit due to her knees buckling. She feels she has been trying to offload her left knee and as a result putting more force on her right knee. She has been using a cane to ambulate and her ambulation has been more limited recently. At times she has had to rest in bed most of the day. At other times she is able to be more active. She asks whether she can get bilateral knee steroid injections today. Her last were over 1 year ago.   Previously  she had tried to be more active, walking the park and track and using the treadmill at the [facility]. She had lost 10 pounds over the previous 2 months, but has since gained 7 lbs back in the last month. She said she has been snacking on M&Ms. Current weight is 297lbs.She has been taking Hydrocodone-Tylenol 30m-325mgthreetimes per day as needed for her knee pain. She has been able to take only 2 some days and so feels ready to push her appointments out to 5 weeks.   She has been taking the Vitamin D supplement weekly and has been feeling better taking it. She has 2 more pills left.    She may be interested in aquatic therapy once pain improves. I [place] never [place] from [place].   The Gabapentin makes her too drowsy to use in the day but she does use it at night and this helps her to sleep well.   Pain Inventory Average Pain 10 Pain Right Now 5 My pain is sharp, burning, stabbing, tingling and aching  In the last 24 hours, has pain interfered with the following? General activity 2 Relation with others 2 Enjoyment of life 2 What TIME of day is your pain at its worst? all day Sleep (in general) Poor  Pain is worse with: walking, bending, sitting, inactivity, standing and some activites Pain improves with: medication and injections Relief from Meds: 10      Family History  Problem Relation Age of Onset  . Alcohol abuse Mother   . Diabetes Mother   . Hyperlipidemia Mother   . Hypertension Mother   . Diabetes Brother   . Hyperlipidemia Brother   . Hypertension Brother   . Thyroid disease Neg Hx    Social History   Socioeconomic History  . Marital status: Single    Spouse name: Not on file  . Number of children: Not on file  . Years of education: Not on file  . Highest education level: Not on file  Occupational History  . Not on file  Tobacco Use  . Smoking status: Former SResearch scientist (life sciences) . Smokeless tobacco:  Former Systems developer  . Tobacco comment: quit 28 yrs ago  Vaping Use  . Vaping Use: Never used  Substance and Sexual Activity  . Alcohol use: No  . Drug use: No  . Sexual activity: Never  Other Topics Concern  . Not on file  Social History Narrative   epworth sleepiness scale = 10 (11/15/15)    Lives alone in a 2 story home.  Has 3 children.  Currently not working.  Has lost 6 jobs in the past 2 months due to her leg numbness and pain.     Education: 2 years of college.   Social Determinants of Health   Financial Resource Strain: Not on file  Food Insecurity: Not on file  Transportation Needs: Not on file  Physical Activity: Not on file  Stress: Not on file  Social Connections: Not on file   Past Surgical History:  Procedure Laterality Date  . ABDOMINAL HYSTERECTOMY     complete  . COLONOSCOPY N/A 01/16/2017   Procedure: COLONOSCOPY;  Surgeon: Carol Ada, MD;  Location: [facility];  Service: Endoscopy;  Laterality: N/A;  . ESOPHAGOGASTRODUODENOSCOPY N/A 01/16/2017   Procedure: ESOPHAGOGASTRODUODENOSCOPY (EGD);  Surgeon: Carol Ada, MD;  Location: [facility];  Service: Endoscopy;  Laterality: N/A;   Past Medical History:  Diagnosis Date  . 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 GERD (gastroesophageal reflux disease)   . Hypercholesteremia   . Hypertension   . Migraine   . Pre-diabetes    BP 120/78   Pulse 61   Temp 98 F (36.7 C)   Ht 5' 5"  (1.651 m)   Wt (!) 307 lb (139.3 kg)   SpO2 96%   BMI 51.09 kg/m   Opioid Risk Score:   Fall Risk Score:  `1  Depression screen PHQ 2/9  Depression screen [facility] 2/9 12/27/2020 11/12/2020 09/17/2020 09/17/2020 05/21/2020 04/14/2020 02/11/2020  Decreased Interest 1 0 1 0 0 0 0  Down, Depressed, Hopeless 1 1 1  0 0 0 0  PHQ - 2 Score 2 1 2  0 0 0 0  Altered sleeping 1 - - - - - 0  Tired, decreased energy - - - - - - 0  Change in appetite - - - - - - 0  Feeling bad or failure about yourself  - - - - - - 0  Trouble concentrating - - - - - - 0   Moving slowly or fidgety/restless - - - - - - 0  Suicidal thoughts - - - - - - 0  PHQ-9 Score 3 - - - - - 0   Review of Systems  Constitutional: Negative.   HENT: Negative.   Eyes: Negative.   Respiratory: Negative.   Cardiovascular: Negative.   Gastrointestinal: Negative.   Endocrine: Negative.   Genitourinary: Negative.   Musculoskeletal: Positive for arthralgias, back pain, gait problem, joint swelling and neck pain.       Spasms in the knees, right hip pain,swelling on left foot  Skin: Positive for rash.  Neurological: Positive for weakness and numbness.       Tingling in legs  Hematological: Negative.   Psychiatric/Behavioral: Negative.   All other systems reviewed and are negative.      Objective:   Physical Exam Gen: no distress, normal appearing HEENT: oral mucosa pink and moist, NCAT Cardio: Reg rate Chest: normal effort, normal rate of breathing Abd: soft, non-distended Ext: no edema Psych: pleasant, normal affect Skin: intact Neuro/Musculoskeletal:AOx3. 5/5 strength in  the bilateral upper extremities. She has 4/5 strength throughout RLE and4-/5 throughout LLE, limited to pain.Normal gait.Tender to palpation in bilateral knees, much more so on right medial aspect, there is palpable nodule and this area is the most tender. Gait: Antalgic, requiring wheelchair today Psych: pleasant, normal affect     Assessment & Plan:  60 year old woman who presents for f/u ofbilateral chronic knee pain secondary to OA.   Bilateral knee OA: -Knee pain has improved since last injection.  -continue knee brace for right knee -decrease Norco to TID PRN.  -May stop Gabapentin as it is not helping.  -Advised that she use Tylenol and Advil as needed to avoid increasing Norco dose further. -Educated that opioids are not appropriate for chronic pain and the goal of this prescription will be to improve her pain and function until she can received surgery. She agreed that her  goal is to get off opioids once able.  -She would like knee replacement once she is able to lose weight.  -Continue Ibuprofen as needed -She has been trying to walk more and was able to walk 1 hr in the store! She continues this! No longer needs walker or cane.! -She has a bright and positive mood today - Recommended exercise bike and aquatic therapy. Provided referral for aquatic therapy previously.  - MRI of right knee given worsening of pain and right medial aspect palpable and tender nodule, reviewed with patient.  -start topiramate 75m HS, can increase to BID if needed.   Morbid obesity,  I provided her a list of healthy foods that help prevent pain.  -Continue Roobois tea 1-3x/day -Restart turmeric daily.  --She chose not to take the Orlistat. Educated regarding stopping sugar in diet and she is motivated to make changes.  -She was very interested in learning more about intermittent fasting. I discussed this with her and provided her with a book on the topic. She has read this and notes improvements in her weight loss goals with this approach.  -She has been trying to eat once per day -She has been trying to cut back beef and pork. -Her attitude is excellent.  -She has tried green tea extract.  -She eats her first meal around at noon. Made goal to eat first meet at 12:15pm.  -Continue metformin -topirmate 244mprescribed  Discussed Benefits of Ghee  -can be used in cooking, high smoke point  -high in fat soluble vitamins A, D, E, and K which are important for skin and vision, preventing leaky gut, strong bones  -free of lactose and casein  -contains conjugated linoleic acid, which can reduce body fat, prevent cancer, decrease inflammation, and lower blood pressure  -high in butyrate- helps support healthy insulin levels, decreases inflammation, decreases digestive problems, maintains healthy gut microbiome  -decreases pain and inflammation  Obesity: -Educated that current  weight is____ and current BMI is___ -Educated regarding health benefits of weight loss- for pain, general health, chronic disease prevention, immune health, mental health.  -Will monitor weight every visit.  -Consider Roobois tea daily.  -Discussed the benefits of intermittent fasting. -Discussed foods that can assist in weight loss: 1) leafy greens- high in fiber and nutrients 2) dark chocolate- improves metabolism (if prefer sweetened, best to sweeten with honey instead of sugar).  3) cruciferous vegetables- high in fiber and protein 4) full fat yogurt: high in healthy fat, protein, calcium, and probiotics 5) apples- high in a variety of phytochemicals 6) nuts- high in fiber and protein that increase feelings  of fullness 7) grapefruit: rich in nutrients, antioxidants, and fiber (not to be taken with anticoagulation) 8) beans- high in protein and fiber 9) salmon- has high quality protein and healthy fats 10) green tea- rich in polyphenols 11) eggs- rich in choline and vitamin D 12) tuna- high protein, boosts metabolism 13) avocado- decreases visceral abdominal fat 14) chicken (pasture raised): high in protein and iron 15) blueberries- reduce abdominal fat and cholesterol 16) whole grains- decreases calories retained during digestion, speeds metabolism 17) chia seeds- curb appetite 18) chilies- increases fat metabolism  -Discussed supplements that can be used:  1) Metatrim 460m BID 30 minutes before breakfast and dinner  2) Sphaeranthus indicus and Garcinia mangostana (combinations of these and #1 can be found in capsicum and zychrome  3) green coffee bean extract 4069mtwice per day or Irvingia (african mango) 150 to 30026mwice per day. -Made goal to___  Recent chest pain: -Cardiac workup negative, but advised that she does have cardiac risk factors and she should return to ED if she experiences similar symptoms.   Prediabetes: -increase metformin to 500m51mD. Will also help  with weight loss. Discussed its benefits in maintaining cognition and antiaging.  -recheck hgbA1c today  Vitamin D deficiency: Completed supplementation.   All questions were encouraged and answered. RTC in 4 weeks  4 weeks with EuniZella Ball then f/u with me in 4 months.

## 2020-12-28 LAB — HEMOGLOBIN A1C
Est. average glucose Bld gHb Est-mCnc: 131 mg/dL
Hgb A1c MFr Bld: 6.2 % — ABNORMAL HIGH (ref 4.8–5.6)

## 2021-01-06 ENCOUNTER — Encounter: Payer: Self-pay | Admitting: *Deleted

## 2021-01-06 ENCOUNTER — Telehealth: Payer: Self-pay | Admitting: *Deleted

## 2021-01-06 LAB — TOXASSURE SELECT,+ANTIDEPR,UR

## 2021-01-06 NOTE — Telephone Encounter (Signed)
Urine drug screen was negative for medication or metabolites. She did not bring her bottle to the appointment and no mention of when last dose taken. Per the PMP she should not have been out of medication and therefore this is an inconsistent UDS. A formal warning has been mailed to Ms Bobby Rumpf to bring medication to each appointment and to take medication only as prescribed. Another negative UDS or forgetting to bring bottle will result in non narcotic treatment only.

## 2021-01-07 IMAGING — CT CT ANGIOGRAPHY CHEST
2 of 8 series · 18 of 46 positions shown · IV contrast (omnipaque)
Comparison: None.

CLINICAL DATA: Chest pain.

EXAM:
CT ANGIOGRAPHY CHEST WITH CONTRAST
TECHNIQUE: Multidetector CT imaging of the chest was performed using the
standard protocol during bolus administration of intravenous
contrast. Multiplanar CT image reconstructions and MIPs were
obtained to evaluate the vascular anatomy.
CONTRAST:  100mL OMNIPAQUE IOHEXOL 350 MG/ML SOLN

[Series 6: thins · axial · 0.79mm/px · z∈[+1305,+1535]mm · 15 of 254 slices shown]
[im 12/254  lung]
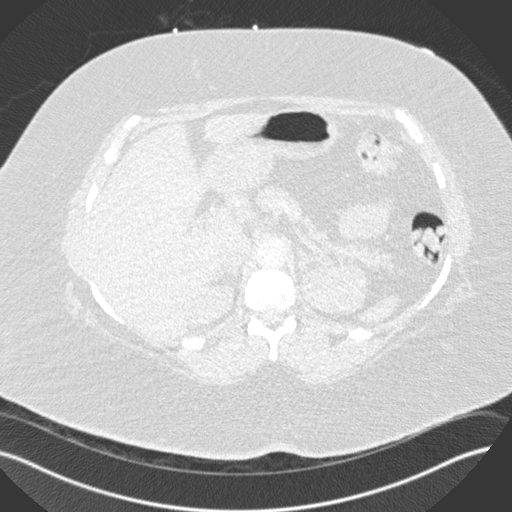
[im 35/254  soft-tissue]
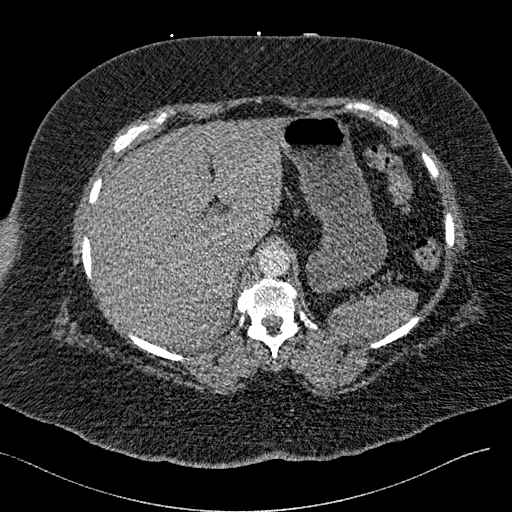
[im 47/254  lung]
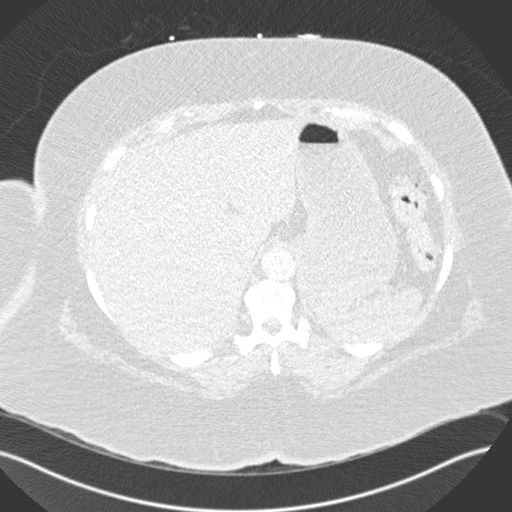
[im 58/254  soft-tissue]
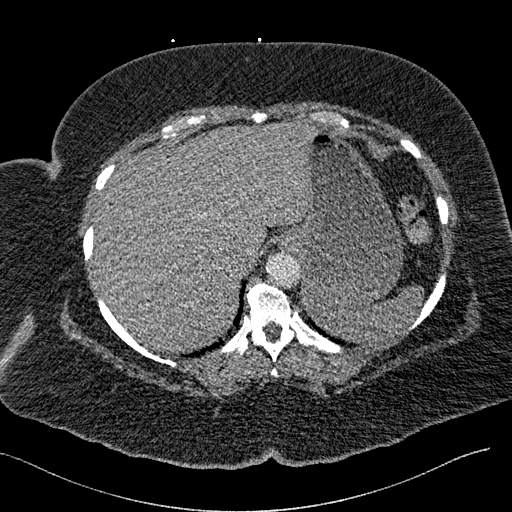
[im 81/254  lung]
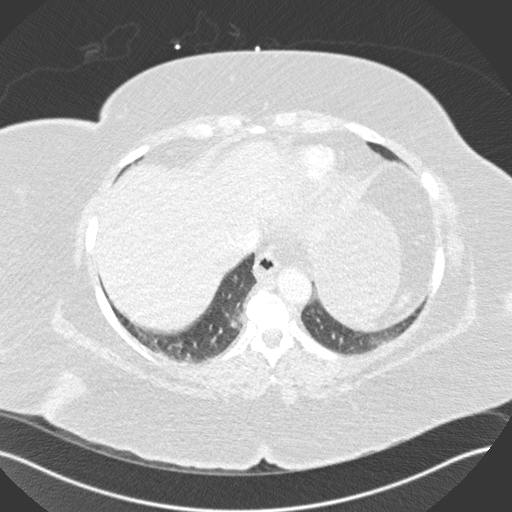
[im 93/254  soft-tissue]
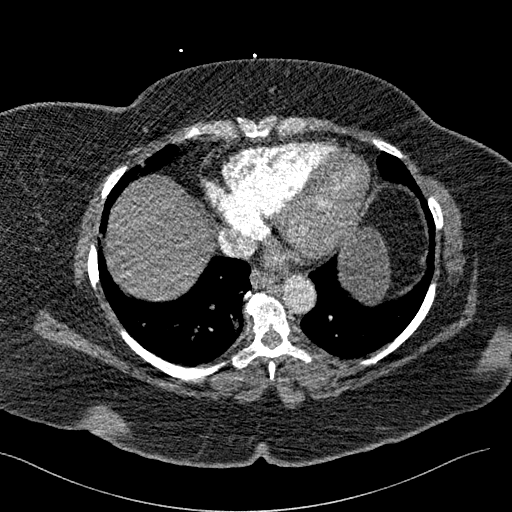
[im 116/254  lung]
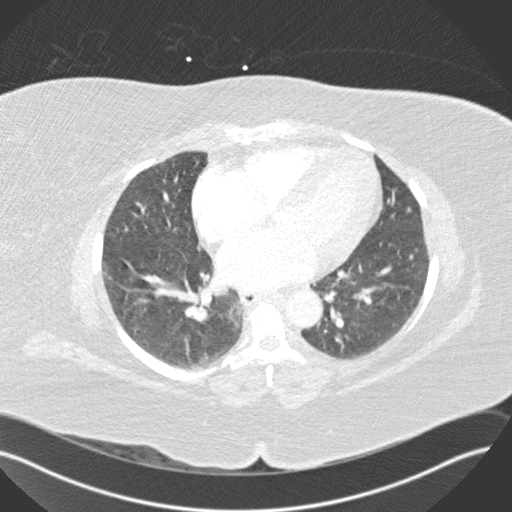
[im 127/254  soft-tissue]
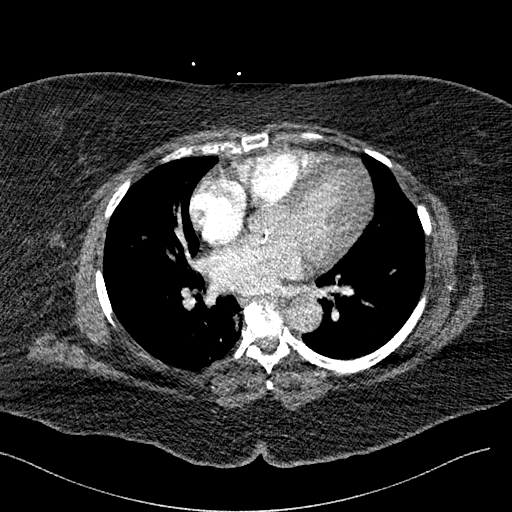
[im 139/254  lung]
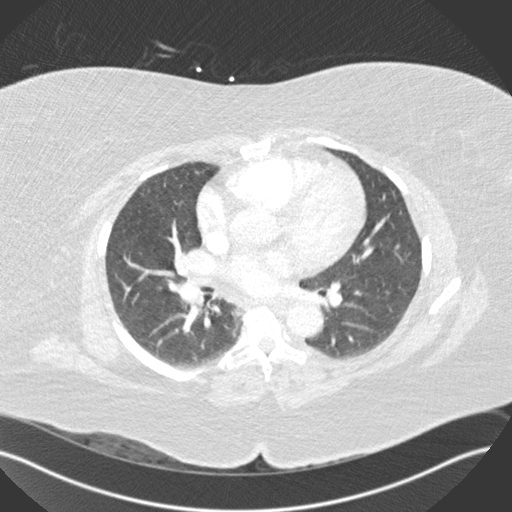
[im 162/254  soft-tissue]
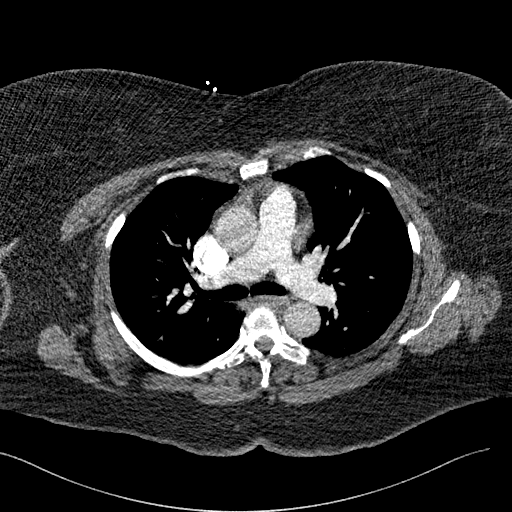
[im 173/254  lung]
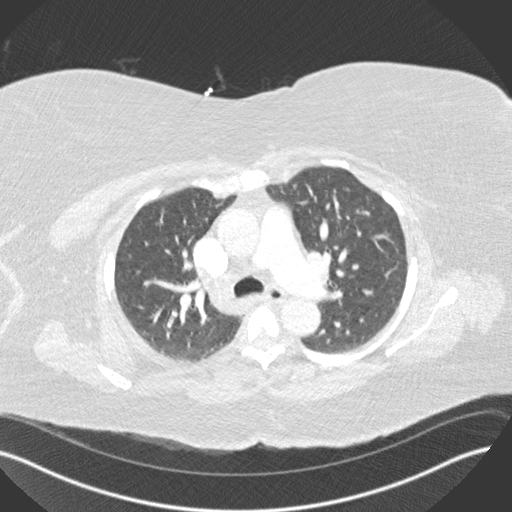
[im 196/254  soft-tissue]
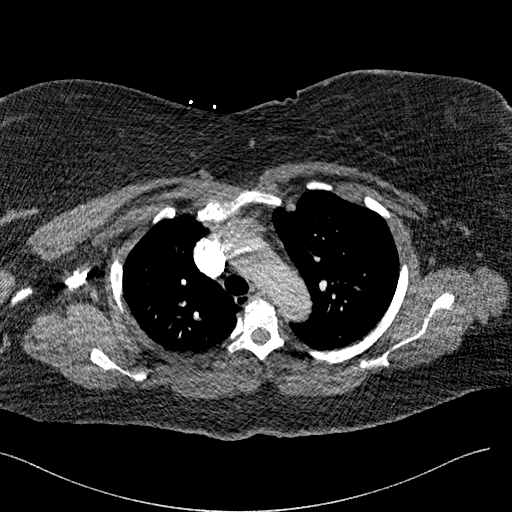
[im 208/254  lung]
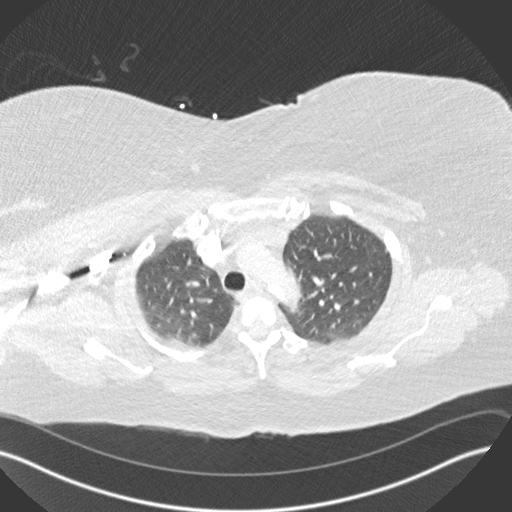
[im 219/254  soft-tissue]
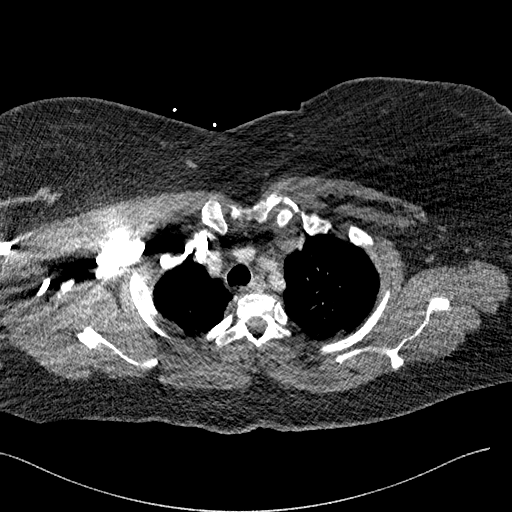
[im 242/254  lung]
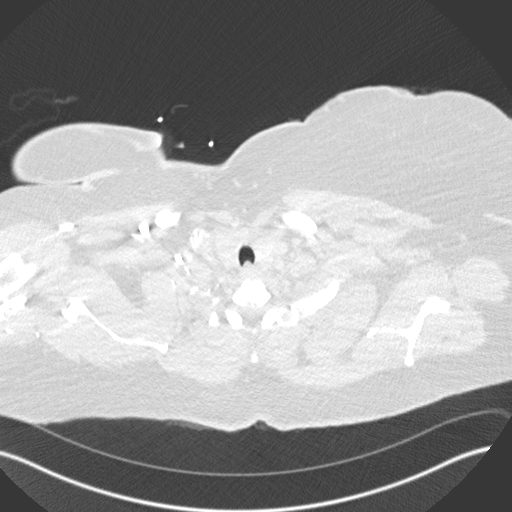

[Series 8: coronal mpr · coronal · 0.61mm/px · 3 of 151 slices shown]
[im 38/151  soft-tissue]
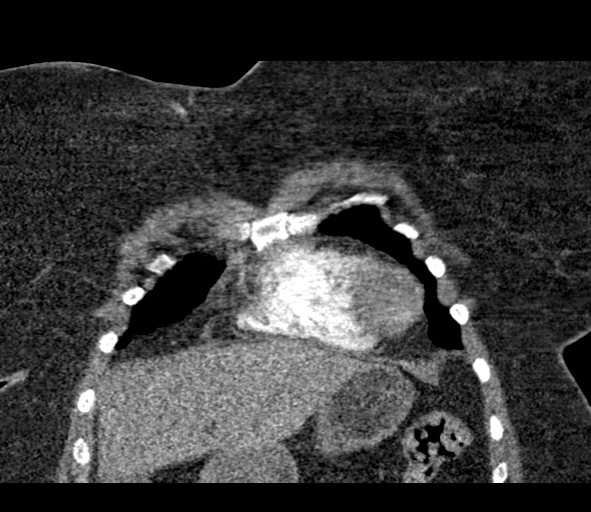
[im 76/151  soft-tissue]
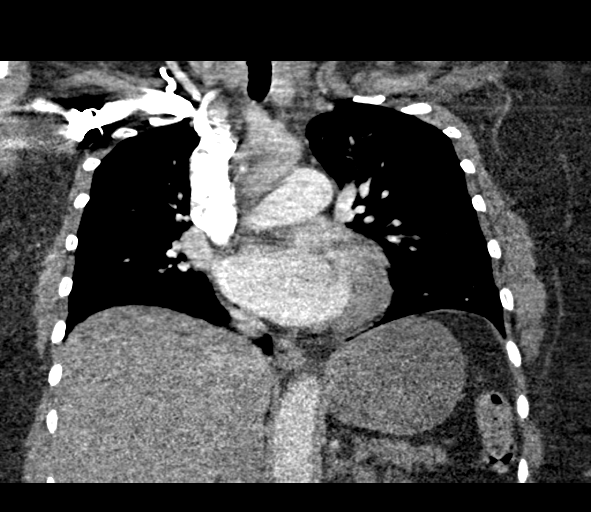
[im 113/151  soft-tissue]
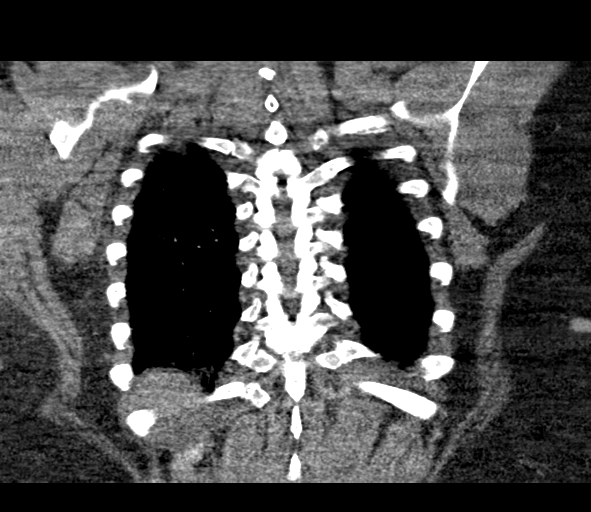

[18 of 46 positions shown; findings below may reference images not displayed]

FINDINGS: Cardiovascular: Examination is limited by patient body habitus,
streak artifact from the patient's arms, and suboptimal contrast
bolus timing. Given these limitations,there is no pulmonary embolus.
The main pulmonary artery is within normal limits for size. There is
no CT evidence of acute right heart strain. There are
atherosclerotic of the thoracic aorta. There are mild coronary
artery calcifications. Heart size is slightly increased. There is no
significant pericardial effusion.

Mediastinum/Nodes:

--No mediastinal or hilar lymphadenopathy.

--No axillary lymphadenopathy.

--No supraclavicular lymphadenopathy.

--Normal thyroid gland.

--The esophagus is unremarkable

Lungs/Pleura: There are subtle ground-glass airspace opacities
involving the peripheral right lower lobe and medial right lower
lobe. The remaining lung fields are essentially clear. There is no
pneumothorax. There is no large pleural effusion.

Upper Abdomen: No acute abnormality.

Musculoskeletal: No chest wall abnormality. No acute or significant
osseous findings.

Review of the MIP images confirms the above findings.
IMPRESSION: 1. Limited study for the detection of pulmonary emboli as detailed
above. Given these limitations, no definite pulmonary embolus was
identified on today's exam.
2. Ground-glass airspace opacities involving the right lower lobe as
detailed above. These could represent a developing infectious
process. Consider a three-month follow-up CT to confirm resolution
of this finding.
3. Mild cardiac enlargement.

Aortic Atherosclerosis ([id number]-[id number].L).

## 2021-01-07 IMAGING — CR CHEST - 2 VIEW
2 series · 2 of 2 positions shown · non-contrast
Comparison: 04/11/2017

CLINICAL DATA: Left chest pain

EXAM:
CHEST - 2 VIEW

[chest pa]
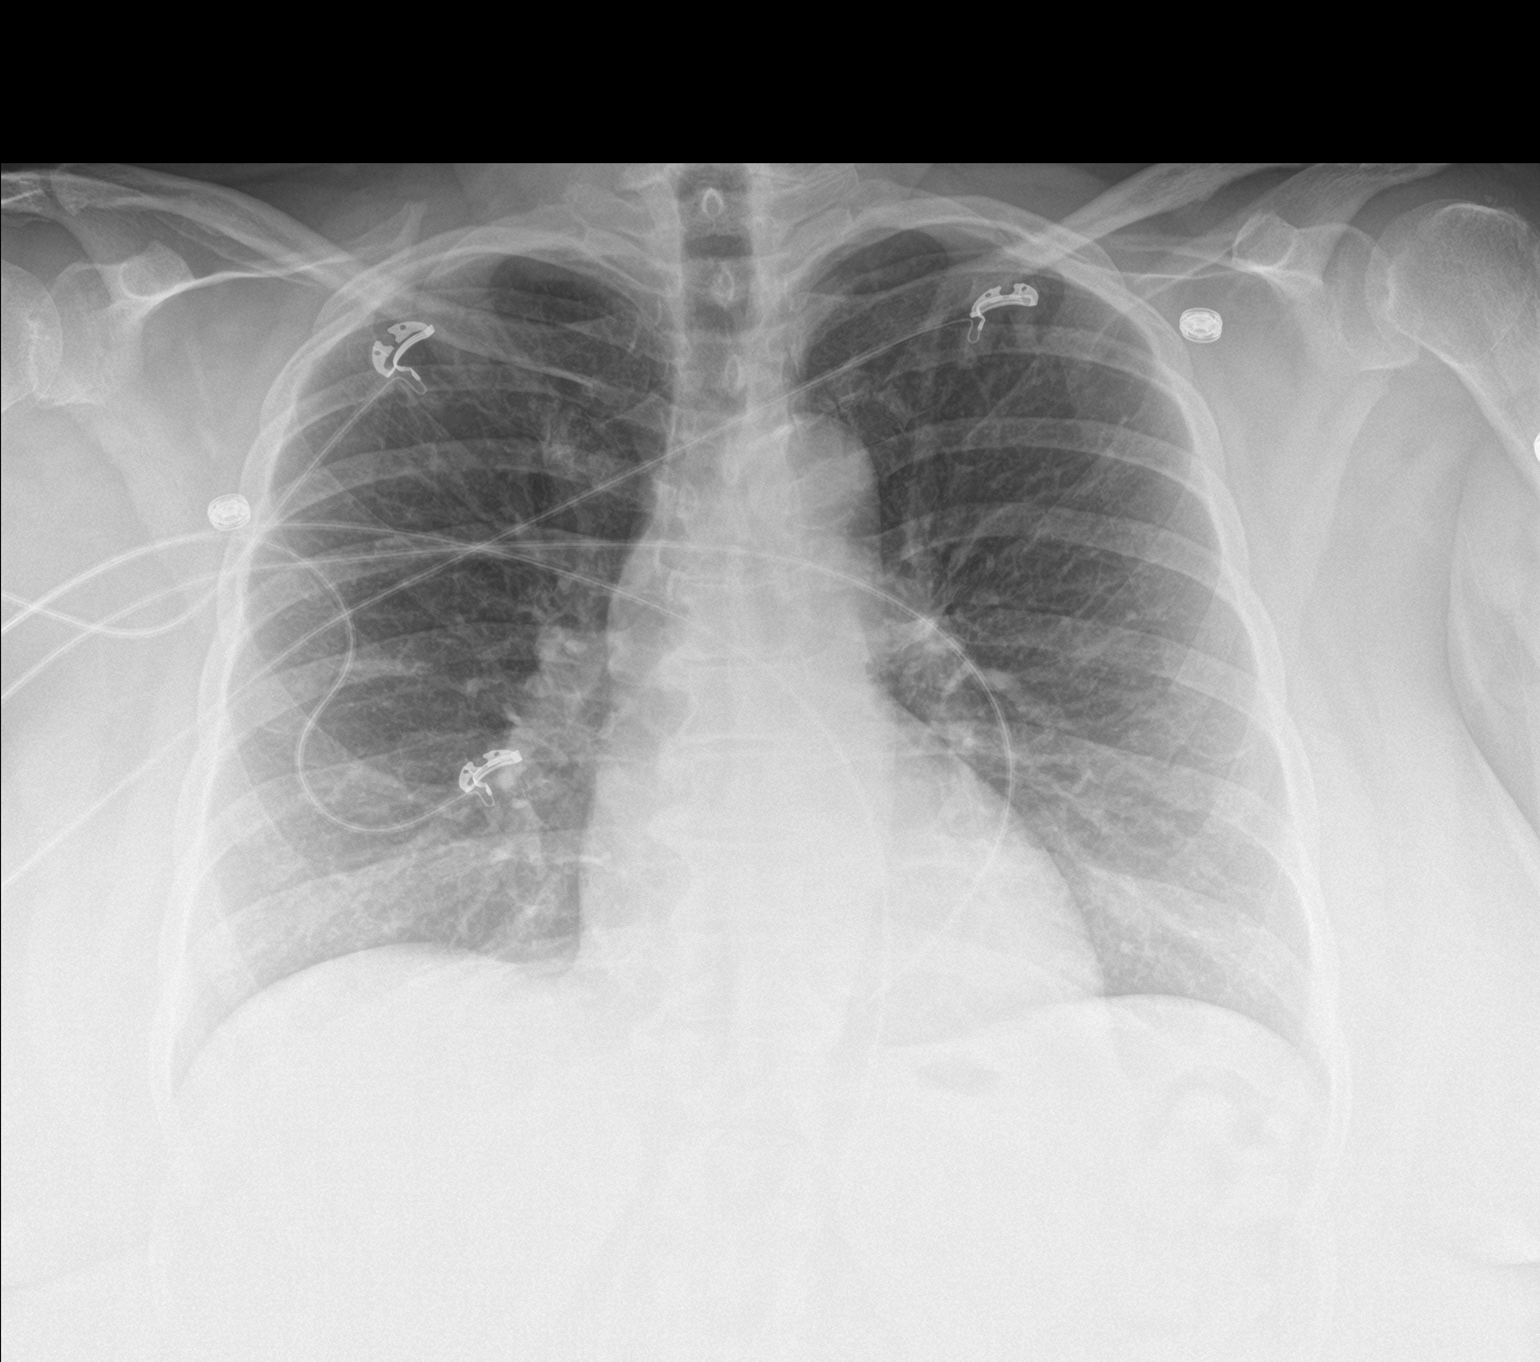

[chest lat]
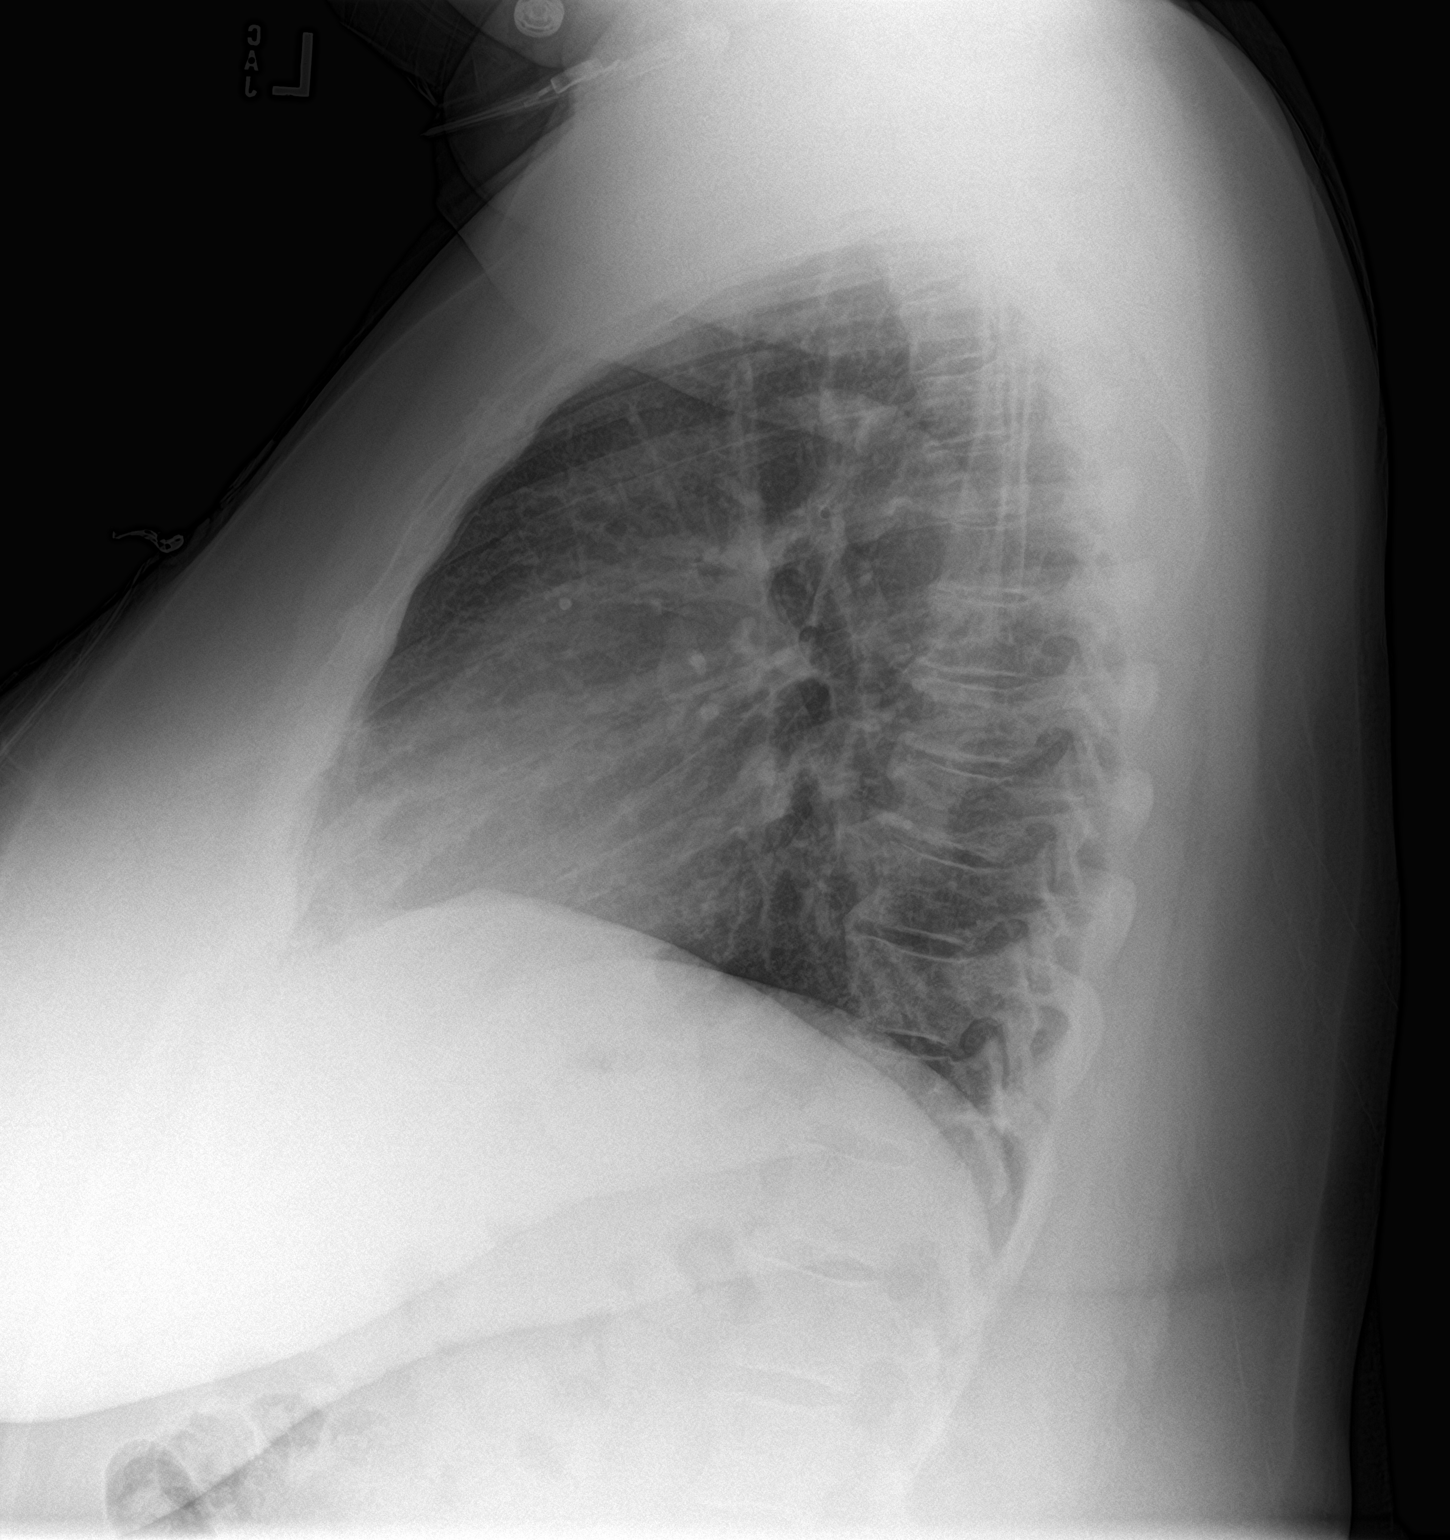

[2 of 2 positions shown; findings below may reference images not displayed]

FINDINGS: Heart and mediastinal contours are within normal limits. No focal
opacities or effusions. No acute bony abnormality.
IMPRESSION: No active cardiopulmonary disease.

## 2021-01-10 IMAGING — CR PORTABLE CHEST - 1 VIEW
1 series · 1 of 1 positions shown · non-contrast
Comparison: 02/16/2019

CLINICAL DATA: Mid chest pain radiating down the left neck and arm
beginning this morning.

EXAM:
PORTABLE CHEST 1 VIEW

[AP]
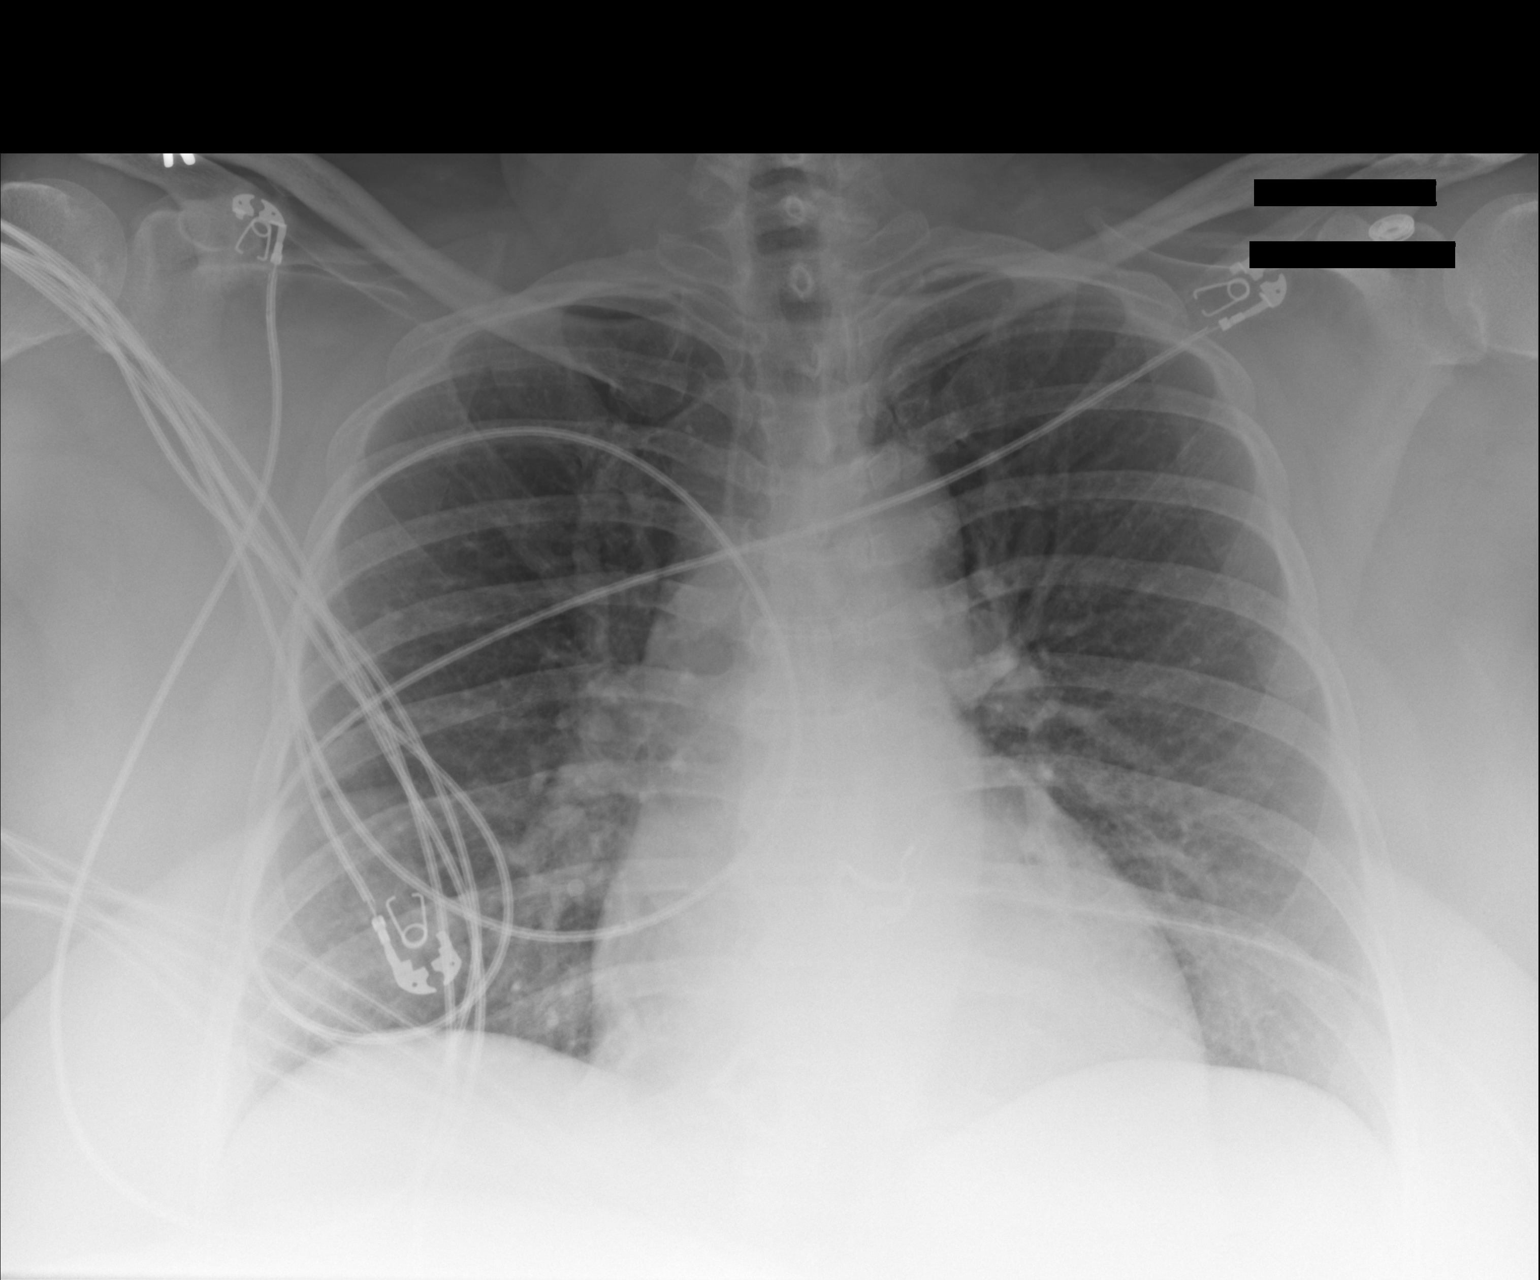

[1 of 1 positions shown; findings below may reference images not displayed]

FINDINGS: The heart size and mediastinal contours are within normal limits.
Both lungs are clear. The visualized skeletal structures are
unremarkable.
IMPRESSION: No active disease.

## 2021-01-24 ENCOUNTER — Encounter
Payer: No Typology Code available for payment source | Attending: Physical Medicine and Rehabilitation | Admitting: Registered Nurse

## 2021-01-24 DIAGNOSIS — Z5181 Encounter for therapeutic drug level monitoring: Secondary | ICD-10-CM | POA: Insufficient documentation

## 2021-01-24 DIAGNOSIS — M7061 Trochanteric bursitis, right hip: Secondary | ICD-10-CM | POA: Insufficient documentation

## 2021-01-24 DIAGNOSIS — M7062 Trochanteric bursitis, left hip: Secondary | ICD-10-CM | POA: Insufficient documentation

## 2021-01-24 DIAGNOSIS — M1711 Unilateral primary osteoarthritis, right knee: Secondary | ICD-10-CM | POA: Insufficient documentation

## 2021-01-24 DIAGNOSIS — Z79891 Long term (current) use of opiate analgesic: Secondary | ICD-10-CM | POA: Insufficient documentation

## 2021-01-24 DIAGNOSIS — G894 Chronic pain syndrome: Secondary | ICD-10-CM | POA: Insufficient documentation

## 2021-01-24 DIAGNOSIS — M1712 Unilateral primary osteoarthritis, left knee: Secondary | ICD-10-CM | POA: Insufficient documentation

## 2021-01-24 DIAGNOSIS — M5416 Radiculopathy, lumbar region: Secondary | ICD-10-CM | POA: Insufficient documentation

## 2021-01-24 NOTE — Progress Notes (Unsigned)
Subjective:    Patient ID: Kelsey Santana, female    DOB: March 19, 1961, 60 y.o.   MRN: [id number]  HPI  Pain Inventory Average Pain {NUMBERS; 0-10:5044} Pain Right Now {NUMBERS; 0-10:5044} My pain is {PAIN DESCRIPTION:21022940}  In the last 24 hours, has pain interfered with the following? General activity {NUMBERS; 0-10:5044} Relation with others {NUMBERS; 0-10:5044} Enjoyment of life {NUMBERS; 0-10:5044} What TIME of day is your pain at its worst? {time of day:24191} Sleep (in general) {BHH GOOD/FAIR/POOR:22877}  Pain is worse with: {ACTIVITIES:21022942} Pain improves with: {PAIN IMPROVES UTML:46503546} Relief from Meds: {NUMBERS; 0-10:5044}  Family History  Problem Relation Age of Onset  . Alcohol abuse Mother   . Diabetes Mother   . Hyperlipidemia Mother   . Hypertension Mother   . Diabetes Brother   . Hyperlipidemia Brother   . Hypertension Brother   . Thyroid disease Neg Hx    Social History   Socioeconomic History  . Marital status: Single    Spouse name: Not on file  . Number of children: Not on file  . Years of education: Not on file  . Highest education level: Not on file  Occupational History  . Not on file  Tobacco Use  . Smoking status: Former Research scientist (life sciences)  . Smokeless tobacco: Former Systems developer  . Tobacco comment: quit 28 yrs ago  Vaping Use  . Vaping Use: Never used  Substance and Sexual Activity  . Alcohol use: No  . Drug use: No  . Sexual activity: Never  Other Topics Concern  . Not on file  Social History Narrative   epworth sleepiness scale = 10 (11/15/15)    Lives alone in a 2 story home.  Has 3 children.  Currently not working.  Has lost 6 jobs in the past 2 months due to her leg numbness and pain.     Education: 2 years of college.   Social Determinants of Health   Financial Resource Strain: Not on file  Food Insecurity: Not on file  Transportation Needs: Not on file  Physical Activity: Not on file  Stress: Not on file  Social  Connections: Not on file   Past Surgical History:  Procedure Laterality Date  . ABDOMINAL HYSTERECTOMY     complete  . COLONOSCOPY N/A 01/16/2017   Procedure: COLONOSCOPY;  Surgeon: Carol Ada, MD;  Location: [facility];  Service: Endoscopy;  Laterality: N/A;  . ESOPHAGOGASTRODUODENOSCOPY N/A 01/16/2017   Procedure: ESOPHAGOGASTRODUODENOSCOPY (EGD);  Surgeon: Carol Ada, MD;  Location: [facility];  Service: Endoscopy;  Laterality: N/A;   Past Surgical History:  Procedure Laterality Date  . ABDOMINAL HYSTERECTOMY     complete  . COLONOSCOPY N/A 01/16/2017   Procedure: COLONOSCOPY;  Surgeon: Carol Ada, MD;  Location: [facility];  Service: Endoscopy;  Laterality: N/A;  . ESOPHAGOGASTRODUODENOSCOPY N/A 01/16/2017   Procedure: ESOPHAGOGASTRODUODENOSCOPY (EGD);  Surgeon: Carol Ada, MD;  Location: [facility];  Service: Endoscopy;  Laterality: N/A;   Past Medical History:  Diagnosis Date  . 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 GERD (gastroesophageal reflux disease)   . Hypercholesteremia   . Hypertension   . Migraine   . Pre-diabetes    There were no vitals taken for this visit.  Opioid Risk Score:   Fall Risk Score:  `1  Depression screen PHQ 2/9  Depression screen [facility] 2/9 12/27/2020 11/12/2020 09/17/2020 09/17/2020 05/21/2020 04/14/2020 02/11/2020  Decreased Interest 1 0 1 0 0 0 0  Down, Depressed, Hopeless 1 1 1  0 0 0 0  PHQ - 2 Score 2 1 2  0 0 0 0  Altered sleeping 1 - - - - - 0  Tired, decreased energy - - - - - - 0  Change in appetite - - - - - - 0  Feeling bad or failure about yourself  - - - - - - 0  Trouble concentrating - - - - - - 0  Moving slowly or fidgety/restless - - - - - - 0  Suicidal thoughts - - - - - - 0  PHQ-9 Score 3 - - - - - 0    Review of Systems     Objective:   Physical Exam        Assessment & Plan:

## 2021-01-25 ENCOUNTER — Encounter: Payer: No Typology Code available for payment source | Admitting: Registered Nurse

## 2021-01-27 ENCOUNTER — Other Ambulatory Visit: Payer: Self-pay

## 2021-01-27 ENCOUNTER — Encounter (HOSPITAL_BASED_OUTPATIENT_CLINIC_OR_DEPARTMENT_OTHER): Payer: No Typology Code available for payment source | Admitting: Registered Nurse

## 2021-01-27 ENCOUNTER — Encounter: Payer: Self-pay | Admitting: Registered Nurse

## 2021-01-27 VITALS — BP 141/67 | HR 69 | Temp 98.2°F | Ht 65.0 in | Wt 305.4 lb

## 2021-01-27 DIAGNOSIS — G894 Chronic pain syndrome: Secondary | ICD-10-CM | POA: Diagnosis present

## 2021-01-27 DIAGNOSIS — M7061 Trochanteric bursitis, right hip: Secondary | ICD-10-CM | POA: Diagnosis present

## 2021-01-27 DIAGNOSIS — M7062 Trochanteric bursitis, left hip: Secondary | ICD-10-CM

## 2021-01-27 DIAGNOSIS — M1711 Unilateral primary osteoarthritis, right knee: Secondary | ICD-10-CM | POA: Diagnosis present

## 2021-01-27 DIAGNOSIS — M1712 Unilateral primary osteoarthritis, left knee: Secondary | ICD-10-CM

## 2021-01-27 DIAGNOSIS — Z79891 Long term (current) use of opiate analgesic: Secondary | ICD-10-CM | POA: Diagnosis present

## 2021-01-27 DIAGNOSIS — M5416 Radiculopathy, lumbar region: Secondary | ICD-10-CM | POA: Diagnosis present

## 2021-01-27 DIAGNOSIS — Z5181 Encounter for therapeutic drug level monitoring: Secondary | ICD-10-CM

## 2021-01-27 MED ORDER — HYDROCODONE-ACETAMINOPHEN 10-325 MG PO TABS: 1.0000 | ORAL_TABLET | Freq: Four times a day (QID) | ORAL | 0 refills | Status: DC | PRN

## 2021-01-27 NOTE — Progress Notes (Signed)
Subjective:    Patient ID: Kelsey Santana, female    DOB: 02/24/1961, 60 y.o.   MRN: [id number]  HPI: Kelsey Santana is a 60 y.o. female who returns for follow up appointment for chronic pain and medication refill. She states her pain is located in her lower back radiating into her right lower extremity, bilateral hip pain R>L and bilateral knee pain R>L. She rates her pain 7. Her current exercise regime is walking and performing stretching exercises.  Kelsey Santana Morphine equivalent is 33.33 MME.  Last UDS was Performed on 12/27/2020, it was consistent.       Pain Inventory Average Pain 10 Pain Right Now 7 My pain is constant, sharp, and aching  In the last 24 hours, has pain interfered with the following? General activity 10 Relation with others 8 Enjoyment of life 10 What TIME of day is your pain at its worst? morning , daytime, evening, and night Sleep (in general) Poor  Pain is worse with: walking, bending, sitting, inactivity, standing, and some activites Pain improves with: rest and medication Relief from Meds: 10  Family History  Problem Relation Age of Onset   Alcohol abuse Mother    Diabetes Mother    Hyperlipidemia Mother    Hypertension Mother    Diabetes Brother    Hyperlipidemia Brother    Hypertension Brother    Thyroid disease Neg Hx    Social History   Socioeconomic History   Marital status: Single    Spouse name: Not on file   Number of children: Not on file   Years of education: Not on file   Highest education level: Not on file  Occupational History   Not on file  Tobacco Use   Smoking status: Former    Pack years: 0.00   Smokeless tobacco: Former   Tobacco comments:    quit 28 yrs ago  Vaping Use   Vaping Use: Never used  Substance and Sexual Activity   Alcohol use: No   Drug use: No   Sexual activity: Never  Other Topics Concern   Not on file  Social History Narrative   epworth sleepiness scale = 10 (11/15/15)    Lives alone  in a 2 story home.  Has 3 children.  Currently not working.  Has lost 6 jobs in the past 2 months due to her leg numbness and pain.     Education: 2 years of college.   Social Determinants of Health   Financial Resource Strain: Not on file  Food Insecurity: Not on file  Transportation Needs: Not on file  Physical Activity: Not on file  Stress: Not on file  Social Connections: Not on file   Past Surgical History:  Procedure Laterality Date   ABDOMINAL HYSTERECTOMY     complete   COLONOSCOPY N/A 01/16/2017   Procedure: COLONOSCOPY;  Surgeon: Carol Ada, MD;  Location: [facility];  Service: Endoscopy;  Laterality: N/A;   ESOPHAGOGASTRODUODENOSCOPY N/A 01/16/2017   Procedure: ESOPHAGOGASTRODUODENOSCOPY (EGD);  Surgeon: Carol Ada, MD;  Location: [facility];  Service: Endoscopy;  Laterality: N/A;   Past Surgical History:  Procedure Laterality Date   ABDOMINAL HYSTERECTOMY     complete   COLONOSCOPY N/A 01/16/2017   Procedure: COLONOSCOPY;  Surgeon: Carol Ada, MD;  Location: [facility];  Service: Endoscopy;  Laterality: N/A;   ESOPHAGOGASTRODUODENOSCOPY N/A 01/16/2017   Procedure: ESOPHAGOGASTRODUODENOSCOPY (EGD);  Surgeon: Carol Ada, MD;  Location: [facility];  Service: Endoscopy;  Laterality: N/A;   Past  Medical History:  Diagnosis Date   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  Opioid Risk Score:   Fall Risk Score:  `1  Depression screen PHQ 2/9  Depression screen [facility] 2/9 12/27/2020 11/12/2020 09/17/2020 09/17/2020 05/21/2020 04/14/2020 02/11/2020  Decreased Interest 1 0 1 0 0 0 0  Down, Depressed, Hopeless 1 1 1  0 0 0 0  PHQ - 2 Score 2 1 2  0 0 0 0  Altered sleeping 1 - - - - - 0  Tired, decreased energy - - - - - - 0  Change in appetite - - - - - - 0  Feeling bad or failure about yourself  - - - - - - 0  Trouble  concentrating - - - - - - 0  Moving slowly or fidgety/restless - - - - - - 0  Suicidal thoughts - - - - - - 0  PHQ-9 Score 3 - - - - - 0    Review of Systems  Musculoskeletal:  Positive for back pain, gait problem and joint swelling.  All other systems reviewed and are negative.     Objective:   Physical Exam Vitals and nursing note reviewed.  Constitutional:      Appearance: Normal appearance.  Cardiovascular:     Rate and Rhythm: Normal rate and regular rhythm.     Pulses: Normal pulses.     Heart sounds: Normal heart sounds.  Pulmonary:     Effort: Pulmonary effort is normal.     Breath sounds: Normal breath sounds.  Musculoskeletal:     Cervical back: Normal range of motion and neck supple.     Comments: Normal Muscle Bulk and Muscle Testing Reveals:  Upper Extremities: Full ROM and Muscle Strength  5/5 Lumbar Hypersensitivity Bilateral Greater Trochanter Tenderness Lower Extremities: Right Lower Extremity: Decreased ROM and Muscle Strength 5/5 Right Lower Extremity Flexion Produces Pain into her Right Patella Left Lower Extremity Flexion Produces Pijn into her Lower Back and Right Hip Arises from Table slowly Antalgic Gait     Skin:    General: Skin is warm and dry.  Neurological:     Mental Status: She is alert and oriented to person, place, and time.  Psychiatric:        Mood and Affect: Mood normal.        Behavior: Behavior normal.         Assessment & Plan:  Right Lumbar Radiculitis: Continue Gabapentin . Continue HEP as Tolerated. Continue to monitor.  Bilateral Greater Trochanter Tenderness: Continue to Alternate Ice and Heat Therapy. Continue to Monitor Bilateral Primary Osteoarthritis of Bilateral Knees: Continue  HEP as Tolerated . Continue to Monitor.  Chronic Pain Syndrome: Refilled: Hydrocodone 10mg /325 one tablet 4 times a day as needed for pain. #120.  We will continue the opioid monitoring program, this consists of regular clinic visits,  examinations, urine drug screen, pill counts as well as use of [place] Controlled Substance Reporting system. A 12 month History has been reviewed on the [place] Controlled Substance Reporting System on 01/27/2021.   F/U in 1 month

## 2021-02-01 ENCOUNTER — Encounter: Payer: Self-pay | Admitting: Registered Nurse

## 2021-02-05 ENCOUNTER — Other Ambulatory Visit: Payer: Self-pay | Admitting: Physical Medicine and Rehabilitation

## 2021-02-05 MED ORDER — LIDOCAINE 5 % EX OINT: 1.0000 "application " | TOPICAL_OINTMENT | CUTANEOUS | 3 refills | Status: AC

## 2021-02-22 ENCOUNTER — Encounter: Payer: No Typology Code available for payment source | Admitting: Registered Nurse

## 2021-03-02 ENCOUNTER — Telehealth: Payer: Self-pay | Admitting: Physical Medicine and Rehabilitation

## 2021-03-02 MED ORDER — HYDROCODONE-ACETAMINOPHEN 10-325 MG PO TABS: 1.0000 | ORAL_TABLET | Freq: Four times a day (QID) | ORAL | 0 refills | Status: DC | PRN

## 2021-03-02 NOTE — Telephone Encounter (Signed)
DR Dagoberto Ligas can you please fill medication refill as Zella Ball is OOO . Can you give one month RX for Rosaisela Schram mrn [phone]  hydroco/ace 10-325 4 x's per day for 30 days at [facility]

## 2021-03-04 ENCOUNTER — Encounter: Payer: No Typology Code available for payment source | Admitting: Registered Nurse

## 2021-03-10 ENCOUNTER — Other Ambulatory Visit: Payer: Self-pay | Admitting: Physical Medicine and Rehabilitation

## 2021-03-10 MED ORDER — AMLODIPINE BESYLATE 5 MG PO TABS: 5.0000 mg | ORAL_TABLET | Freq: Every day | ORAL | 0 refills | Status: DC

## 2021-03-11 ENCOUNTER — Other Ambulatory Visit: Payer: Self-pay | Admitting: Physical Medicine and Rehabilitation

## 2021-03-16 ENCOUNTER — Encounter
Payer: No Typology Code available for payment source | Attending: Physical Medicine and Rehabilitation | Admitting: Registered Nurse

## 2021-03-16 DIAGNOSIS — Z5181 Encounter for therapeutic drug level monitoring: Secondary | ICD-10-CM | POA: Insufficient documentation

## 2021-03-16 DIAGNOSIS — G894 Chronic pain syndrome: Secondary | ICD-10-CM | POA: Insufficient documentation

## 2021-03-16 DIAGNOSIS — Z79891 Long term (current) use of opiate analgesic: Secondary | ICD-10-CM | POA: Insufficient documentation

## 2021-03-24 ENCOUNTER — Ambulatory Visit: Payer: No Typology Code available for payment source | Admitting: Registered Nurse

## 2021-03-25 ENCOUNTER — Encounter: Payer: No Typology Code available for payment source | Admitting: Registered Nurse

## 2021-03-28 ENCOUNTER — Other Ambulatory Visit: Payer: Self-pay | Admitting: Physical Medicine and Rehabilitation

## 2021-03-28 MED ORDER — PAXLOVID 10 X 150 MG & 10 X 100MG PO TBPK: 2.0000 | ORAL_TABLET | Freq: Two times a day (BID) | ORAL | 0 refills | Status: AC

## 2021-04-01 ENCOUNTER — Ambulatory Visit: Payer: No Typology Code available for payment source | Admitting: Registered Nurse

## 2021-04-12 ENCOUNTER — Encounter: Payer: No Typology Code available for payment source | Admitting: Registered Nurse

## 2021-04-21 NOTE — Progress Notes (Signed)
Subjective:    Patient ID: Kelsey Santana, female    DOB: 24-Dec-1960, 60 y.o.   MRN: [id number]  HPI: Kelsey Santana is a 60 y.o. female who returns for f/u appointment for chronic pain secondary to bilateral knee osteoarthritis, and obesity. She states her pain is located in her lower back radiating into her right lower extremity, bilateral hip pain R>L and bilateral knee pain R>L. She rates her pain 7. Her current exercise regime is walking and performing stretching exercises.  Kelsey Santana Morphine equivalent is 33.33 MME. She is ready for bilateral steroid injections today She is upset about about her weight gain and is consider bypass surgery She has still been drinking sodas She has been sleeping poorly at night [facility]     Pain Inventory Average Pain 10 Pain Right Now 7 My pain is constant, sharp, and aching  In the last 24 hours, has pain interfered with the following? General activity 10 Relation with others 8 Enjoyment of life 10 What TIME of day is your pain at its worst? morning , daytime, evening, and night Sleep (in general) Poor  Pain is worse with: walking, bending, sitting, inactivity, standing, and some activites Pain improves with: rest and medication Relief from Meds: 10  Family History  Problem Relation Age of Onset   Alcohol abuse Mother    Diabetes Mother    Hyperlipidemia Mother    Hypertension Mother    Diabetes Brother    Hyperlipidemia Brother    Hypertension Brother    Thyroid disease Neg Hx    Social History   Socioeconomic History   Marital status: Single    Spouse name: Not on file   Number of children: Not on file   Years of education: Not on file   Highest education level: Not on file  Occupational History   Not on file  Tobacco Use   Smoking status: Former   Smokeless tobacco: Former   Tobacco comments:    quit 28 yrs ago  Vaping Use   Vaping Use: Never used  Substance and Sexual Activity    Alcohol use: No   Drug use: No   Sexual activity: Never  Other Topics Concern   Not on file  Social History Narrative   epworth sleepiness scale = 10 (11/15/15)    Lives alone in a 2 story home.  Has 3 children.  Currently not working.  Has lost 6 jobs in the past 2 months due to her leg numbness and pain.     Education: 2 years of college.   Social Determinants of Health   Financial Resource Strain: Not on file  Food Insecurity: Not on file  Transportation Needs: Not on file  Physical Activity: Not on file  Stress: Not on file  Social Connections: Not on file   Past Surgical History:  Procedure Laterality Date   ABDOMINAL HYSTERECTOMY     complete   COLONOSCOPY N/A 01/16/2017   Procedure: COLONOSCOPY;  Surgeon: Carol Ada, MD;  Location: [facility];  Service: Endoscopy;  Laterality: N/A;   ESOPHAGOGASTRODUODENOSCOPY N/A 01/16/2017   Procedure: ESOPHAGOGASTRODUODENOSCOPY (EGD);  Surgeon: Carol Ada, MD;  Location: [facility];  Service: Endoscopy;  Laterality: N/A;   Past Surgical History:  Procedure Laterality Date   ABDOMINAL HYSTERECTOMY     complete   COLONOSCOPY N/A 01/16/2017   Procedure: COLONOSCOPY;  Surgeon: Carol Ada, MD;  Location: [facility];  Service: Endoscopy;  Laterality: N/A;  ESOPHAGOGASTRODUODENOSCOPY N/A 01/16/2017   Procedure: ESOPHAGOGASTRODUODENOSCOPY (EGD);  Surgeon: Carol Ada, MD;  Location: [facility];  Service: Endoscopy;  Laterality: N/A;   Past Medical History:  Diagnosis Date   Back pain    l 4 and l5 djd   GERD (gastroesophageal reflux disease)    Hypercholesteremia    Hypertension    Migraine    Pre-diabetes    BP 137/83 (BP Location: Right Arm)   Pulse (!) 59   Temp 97.9 F (36.6 C) (Oral)   Ht '5\' 5"'$  (1.651 m)   Wt (!) 302 lb 12.8 oz (137.3 kg)   SpO2 97%   BMI 50.39 kg/m   Opioid Risk Score:   Fall Risk Score:  `1  Depression screen PHQ 2/9  Depression screen [facility] 2/9 04/22/2021 01/27/2021 12/27/2020 11/12/2020  09/17/2020 09/17/2020 05/21/2020  Decreased Interest '1 1 1 '$ 0 1 0 0  Down, Depressed, Hopeless '1 1 1 1 1 '$ 0 0  PHQ - 2 Score '2 2 2 1 2 '$ 0 0  Altered sleeping - - 1 - - - -  Tired, decreased energy - - - - - - -  Change in appetite - - - - - - -  Feeling bad or failure about yourself  - - - - - - -  Trouble concentrating - - - - - - -  Moving slowly or fidgety/restless - - - - - - -  Suicidal thoughts - - - - - - -  PHQ-9 Score - - 3 - - - -    Review of Systems  Musculoskeletal:  Positive for back pain, gait problem and joint swelling.  All other systems reviewed and are negative.     Objective:   Physical Exam Vitals and nursing note reviewed.  Constitutional:      Appearance: Normal appearance.  Cardiovascular:     Rate and Rhythm: Normal rate and regular rhythm.     Pulses: Normal pulses.     Heart sounds: Normal heart sounds.  Pulmonary:     Effort: Pulmonary effort is normal.     Breath sounds: Normal breath sounds.  Musculoskeletal:     Cervical back: Normal range of motion and neck supple.     Comments: Normal Muscle Bulk and Muscle Testing Reveals:  Upper Extremities: Full ROM and Muscle Strength  5/5 Lumbar Hypersensitivity Bilateral Greater Trochanter Tenderness Lower Extremities: Right Lower Extremity: Decreased ROM and Muscle Strength 5/5 Right Lower Extremity Flexion Produces Pain into her Right Patella Left Lower Extremity Flexion Produces Pijn into her Lower Back and Right Hip Arises from Table slowly Antalgic Gait  Tenderness to palpation bilateral knees.  Skin:    General: Skin is warm and dry.  Neurological:     Mental Status: She is alert and oriented to person, place, and time.  Psychiatric:        Mood and Affect: Mood normal.        Behavior: Behavior normal.        Assessment & Plan:  Right Lumbar Radiculitis: Continue Gabapentin . Continue HEP as Tolerated. Continue to monitor.  Bilateral Greater Trochanter Tenderness: Continue to Alternate Ice  and Heat Therapy. Continue to Monitor Bilateral Primary Osteoarthritis of Bilateral Knees: Continue  HEP as Tolerated . Continue to Monitor. Performed steroid injection as below.  Chronic Pain Syndrome: Refilled: Hydrocodone '10mg'$ /325 one tablet 4 times a day as needed for pain. #120.  We will continue the opioid monitoring program, this consists of regular clinic visits, examinations,  urine drug screen, pill counts as well as use of [place] Controlled Substance Reporting system.  5. Obesity: Weight is 302 lbs, BMI 50.39: continue shrimp as source of protein. Mercury in salmon may be contributing to her joint symptoms. Advised to stop sodas and made plan to do so. She did not tolerate topamax well. Discussed gastric bypass surgery.  6. Insomnia: -Try to go outside near sunrise -Get exercise during the day.  -Discussed good sleep hygiene: turning off all devices an hour before bedtime.  -Chamomile tea with dinner.  -Can consider over the counter melatonin  Knee injection, bilateral  Indication: Bilateral knee pain not relieved by medication management and other conservative care.  Informed consent was obtained after describing risks and benefits of the procedure with the patient, this includes bleeding, bruising, infection and medication side effects. The patient wishes to proceed and has given written consent. The patient was placed in a recumbent position. The medial aspect of the knee was marked and prepped with Betadine and alcohol. It was then entered with a 25-gauge 1-1/2 inch needle and 4 mLs of 1% lidocaine and one ML of '6mg'$  per mL betamethasone was injected after negative drawback for blood. The procedure was repeated on the other knee. The patient tolerated the procedure well. Post procedure instructions were given.

## 2021-04-22 ENCOUNTER — Other Ambulatory Visit: Payer: Self-pay

## 2021-04-22 ENCOUNTER — Encounter
Payer: No Typology Code available for payment source | Attending: Physical Medicine and Rehabilitation | Admitting: Physical Medicine and Rehabilitation

## 2021-04-22 ENCOUNTER — Encounter: Payer: Self-pay | Admitting: Physical Medicine and Rehabilitation

## 2021-04-22 VITALS — BP 137/83 | HR 59 | Temp 97.9°F | Ht 65.0 in | Wt 302.8 lb

## 2021-04-22 DIAGNOSIS — Z79891 Long term (current) use of opiate analgesic: Secondary | ICD-10-CM | POA: Insufficient documentation

## 2021-04-22 DIAGNOSIS — G4701 Insomnia due to medical condition: Secondary | ICD-10-CM | POA: Diagnosis present

## 2021-04-22 DIAGNOSIS — M1712 Unilateral primary osteoarthritis, left knee: Secondary | ICD-10-CM | POA: Insufficient documentation

## 2021-04-22 DIAGNOSIS — Z6841 Body Mass Index (BMI) 40.0 and over, adult: Secondary | ICD-10-CM | POA: Insufficient documentation

## 2021-04-22 DIAGNOSIS — G894 Chronic pain syndrome: Secondary | ICD-10-CM | POA: Diagnosis present

## 2021-04-22 DIAGNOSIS — M1711 Unilateral primary osteoarthritis, right knee: Secondary | ICD-10-CM | POA: Diagnosis not present

## 2021-04-22 DIAGNOSIS — Z5181 Encounter for therapeutic drug level monitoring: Secondary | ICD-10-CM | POA: Diagnosis not present

## 2021-04-22 MED ORDER — HYDROCODONE-ACETAMINOPHEN 10-325 MG PO TABS: 1.0000 | ORAL_TABLET | Freq: Three times a day (TID) | ORAL | 0 refills | Status: DC | PRN

## 2021-04-22 NOTE — Patient Instructions (Signed)
Insomnia: -Try to go outside near sunrise -Get exercise during the day.  -Discussed good sleep hygiene: turning off all devices an hour before bedtime.  -Chamomile tea with dinner.  -Can consider over the counter melatonin  - Foods that may reduce pain: 1) Ginger (especially studied for arthritis)- reduce leukotriene production to decrease inflammation 2) Blueberries- high in phytonutrients that decrease inflammation 3) Salmon- marine omega-3s reduce joint swelling and pain 4) Pumpkin seeds- reduce inflammation 5) dark chocolate- reduces inflammation 6) turmeric- reduces inflammation 7) tart cherries - reduce pain and stiffness 8) extra virgin olive oil - its compound olecanthal helps to block prostaglandins  9) chili peppers- can be eaten or applied topically via capsaicin 10) mint- helpful for headache, muscle aches, joint pain, and itching 11) garlic- reduces inflammation  Link to further information on diet for chronic pain: [email or web address]

## 2021-04-27 ENCOUNTER — Telehealth: Payer: Self-pay | Admitting: Physical Medicine and Rehabilitation

## 2021-04-27 NOTE — Telephone Encounter (Signed)
Kelsey Santana patient states you left voicemail- does not have copy of medicaid card or number - she will call back in the AM

## 2021-04-28 LAB — TOXASSURE SELECT,+ANTIDEPR,UR

## 2021-04-29 ENCOUNTER — Telehealth: Payer: Self-pay | Admitting: *Deleted

## 2021-04-29 NOTE — Telephone Encounter (Signed)
Urine drug screen for this encounter is consistent for prescribed medication 

## 2021-05-04 ENCOUNTER — Other Ambulatory Visit: Payer: Self-pay | Admitting: Physical Medicine and Rehabilitation

## 2021-05-04 MED ORDER — METFORMIN HCL 500 MG PO TABS: ORAL_TABLET | ORAL | 5 refills | Status: DC

## 2021-05-20 ENCOUNTER — Encounter: Payer: No Typology Code available for payment source | Admitting: Registered Nurse

## 2021-05-25 ENCOUNTER — Encounter
Payer: No Typology Code available for payment source | Attending: Physical Medicine and Rehabilitation | Admitting: Registered Nurse

## 2021-05-25 ENCOUNTER — Encounter: Payer: Self-pay | Admitting: Registered Nurse

## 2021-05-25 ENCOUNTER — Other Ambulatory Visit: Payer: Self-pay

## 2021-05-25 VITALS — BP 121/76 | HR 62 | Temp 98.2°F | Ht 65.0 in | Wt 301.2 lb

## 2021-05-25 DIAGNOSIS — R29898 Other symptoms and signs involving the musculoskeletal system: Secondary | ICD-10-CM | POA: Insufficient documentation

## 2021-05-25 DIAGNOSIS — M1712 Unilateral primary osteoarthritis, left knee: Secondary | ICD-10-CM | POA: Insufficient documentation

## 2021-05-25 DIAGNOSIS — M1711 Unilateral primary osteoarthritis, right knee: Secondary | ICD-10-CM | POA: Diagnosis present

## 2021-05-25 DIAGNOSIS — M7061 Trochanteric bursitis, right hip: Secondary | ICD-10-CM | POA: Insufficient documentation

## 2021-05-25 DIAGNOSIS — M5416 Radiculopathy, lumbar region: Secondary | ICD-10-CM | POA: Insufficient documentation

## 2021-05-25 DIAGNOSIS — Z9181 History of falling: Secondary | ICD-10-CM

## 2021-05-25 MED ORDER — HYDROCODONE-ACETAMINOPHEN 10-325 MG PO TABS: 1.0000 | ORAL_TABLET | Freq: Three times a day (TID) | ORAL | 0 refills | Status: DC | PRN

## 2021-05-25 NOTE — Progress Notes (Signed)
Subjective:    Patient ID: Kelsey Santana, female    DOB: [birth year], 60 y.o.   MRN: [id number]  HPI: Kelsey Santana is a 60 y.o. female who returns for follow up appointment for chronic pain and medication refill. She states her pain is located in her lower back pain , right hip pain and bilateral knee pain. She also reports she has a headache today. She rates her pain 0. Her current exercise regime is walking and performing stretching exercises.  Kelsey Santana Morphine equivalent is 27.00 MME.   Last UDS was Performed on 04/22/2021, it was consistent.      Pain Inventory Average Pain 10 Pain Right Now 0 My pain is sharp, stabbing, and aching  In the last 24 hours, has pain interfered with the following? General activity 3 Relation with others 3 Enjoyment of life 3 What TIME of day is your pain at its worst? morning , daytime, evening, and night Sleep (in general) Poor  Pain is worse with: walking, bending, sitting, inactivity, standing, and some activites Pain improves with: medication and injections Relief from Meds: 9  Family History  Problem Relation Age of Onset   Alcohol abuse Mother    Diabetes Mother    Hyperlipidemia Mother    Hypertension Mother    Diabetes Brother    Hyperlipidemia Brother    Hypertension Brother    Thyroid disease Neg Hx    Social History   Socioeconomic History   Marital status: Single    Spouse name: Not on file   Number of children: Not on file   Years of education: Not on file   Highest education level: Not on file  Occupational History   Not on file  Tobacco Use   Smoking status: Former   Smokeless tobacco: Former   Tobacco comments:    quit 28 yrs ago  Vaping Use   Vaping Use: Never used  Substance and Sexual Activity   Alcohol use: No   Drug use: No   Sexual activity: Never  Other Topics Concern   Not on file  Social History Narrative   epworth sleepiness scale = 10 (11/15/15)    Lives alone in a 2 story home.  Has  3 children.  Currently not working.  Has lost 6 jobs in the past 2 months due to her leg numbness and pain.     Education: 2 years of college.   Social Determinants of Health   Financial Resource Strain: Not on file  Food Insecurity: Not on file  Transportation Needs: Not on file  Physical Activity: Not on file  Stress: Not on file  Social Connections: Not on file   Past Surgical History:  Procedure Laterality Date   ABDOMINAL HYSTERECTOMY     complete   COLONOSCOPY N/A 01/16/2017   Procedure: COLONOSCOPY;  Surgeon: Carol Ada, MD;  Location: [facility];  Service: Endoscopy;  Laterality: N/A;   ESOPHAGOGASTRODUODENOSCOPY N/A 01/16/2017   Procedure: ESOPHAGOGASTRODUODENOSCOPY (EGD);  Surgeon: Carol Ada, MD;  Location: [facility];  Service: Endoscopy;  Laterality: N/A;   Past Surgical History:  Procedure Laterality Date   ABDOMINAL HYSTERECTOMY     complete   COLONOSCOPY N/A 01/16/2017   Procedure: COLONOSCOPY;  Surgeon: Carol Ada, MD;  Location: [facility];  Service: Endoscopy;  Laterality: N/A;   ESOPHAGOGASTRODUODENOSCOPY N/A 01/16/2017   Procedure: ESOPHAGOGASTRODUODENOSCOPY (EGD);  Surgeon: Carol Ada, MD;  Location: [facility];  Service: Endoscopy;  Laterality: N/A;   Past Medical History:  Diagnosis  Date   Back pain    l 4 and l5 djd   GERD (gastroesophageal reflux disease)    Hypercholesteremia    Hypertension    Migraine    Pre-diabetes    BP 121/76   Pulse (!) 57   Temp 98.2 F (36.8 C) (Oral)   Ht 5\' 5"  (1.651 m)   Wt (!) 301 lb 3.2 oz (136.6 kg)   SpO2 95%   BMI 50.12 kg/m   Opioid Risk Score:   Fall Risk Score:  `1  Depression screen PHQ 2/9  Depression screen [facility] 2/9 05/25/2021 04/22/2021 01/27/2021 12/27/2020 11/12/2020 09/17/2020 09/17/2020  Decreased Interest 1 1 1 1  0 1 0  Down, Depressed, Hopeless 1 1 1 1 1 1  0  PHQ - 2 Score 2 2 2 2 1 2  0  Altered sleeping - - - 1 - - -  Tired, decreased energy - - - - - - -  Change in appetite -  - - - - - -  Feeling bad or failure about yourself  - - - - - - -  Trouble concentrating - - - - - - -  Moving slowly or fidgety/restless - - - - - - -  Suicidal thoughts - - - - - - -  PHQ-9 Score - - - 3 - - -     Review of Systems  Musculoskeletal:  Positive for back pain.       Bilateral knee pain  Neurological:  Positive for weakness.  All other systems reviewed and are negative.     Objective:   Physical Exam Vitals and nursing note reviewed.  Constitutional:      Appearance: Normal appearance.  Cardiovascular:     Rate and Rhythm: Normal rate and regular rhythm.     Pulses: Normal pulses.     Heart sounds: Normal heart sounds.  Pulmonary:     Effort: Pulmonary effort is normal.     Breath sounds: Normal breath sounds.  Musculoskeletal:     Cervical back: Normal range of motion and neck supple.     Comments: Normal Muscle Bulk and Muscle Testing Reveals:  Upper Extremities: Full ROM and Muscle Strength 5/5 Lumbar Paraspinal Tenderness: L-4-L-5 Lower Extremities: Full ROM and Muscle Strength 5/5 Arises from Table with ease Narrow Based  Gait     Skin:    General: Skin is warm and dry.  Neurological:     Mental Status: She is alert and oriented to person, place, and time.  Psychiatric:        Mood and Affect: Mood normal.        Behavior: Behavior normal.         Assessment & Plan:  Chronic Low Back Pain without Sciatica/ Right Lumbar Radiculitis: Continue Gabapentin . Continue HEP as Tolerated. Continue to monitor. 05/25/2021 Right Greater Trochanter Tenderness: Continue to Alternate Ice and Heat Therapy. Continue to Monitor. 05/25/2021 Bilateral Primary Osteoarthritis of Bilateral Knees: Continue  HEP as Tolerated . Continue to Monitor. 05/25/2021 Chronic Pain Syndrome: Refilled: Hydrocodone 10mg /325 one tablet 4 times a day as needed for pain. #120.  We will continue the opioid monitoring program, this consists of regular clinic visits, examinations, urine  drug screen, pill counts as well as use of [place] Controlled Substance Reporting system. A 12 month History has been reviewed on the [place] Controlled Substance Reporting System on 05/25/2021.    F/U in 1 month

## 2021-06-17 ENCOUNTER — Ambulatory Visit: Payer: No Typology Code available for payment source | Admitting: Registered Nurse

## 2021-06-21 ENCOUNTER — Other Ambulatory Visit: Payer: Self-pay | Admitting: Medical

## 2021-06-23 ENCOUNTER — Encounter
Payer: No Typology Code available for payment source | Attending: Physical Medicine and Rehabilitation | Admitting: Registered Nurse

## 2021-06-23 DIAGNOSIS — R29898 Other symptoms and signs involving the musculoskeletal system: Secondary | ICD-10-CM | POA: Insufficient documentation

## 2021-06-23 DIAGNOSIS — M1712 Unilateral primary osteoarthritis, left knee: Secondary | ICD-10-CM | POA: Insufficient documentation

## 2021-06-23 DIAGNOSIS — M7061 Trochanteric bursitis, right hip: Secondary | ICD-10-CM | POA: Insufficient documentation

## 2021-06-23 DIAGNOSIS — M5416 Radiculopathy, lumbar region: Secondary | ICD-10-CM | POA: Insufficient documentation

## 2021-06-23 DIAGNOSIS — M1711 Unilateral primary osteoarthritis, right knee: Secondary | ICD-10-CM | POA: Insufficient documentation

## 2021-06-23 NOTE — Progress Notes (Deleted)
Subjective:    Patient ID: Kelsey Santana, female    DOB: [birth year], 60 y.o.   MRN: [id number]  HPI  Pain Inventory Average Pain 10 Pain Right Now 0 My pain is sharp, stabbing, and aching  In the last 24 hours, has pain interfered with the following? General activity 3 Relation with others 3 Enjoyment of life 3 What TIME of day is your pain at its worst? morning , daytime, evening, and night Sleep (in general) Poor  Pain is worse with: walking, bending, sitting, standing, and some activites Pain improves with: medication and injections Relief from Meds: 9  Family History  Problem Relation Age of Onset  . Alcohol abuse Mother   . Diabetes Mother   . Hyperlipidemia Mother   . Hypertension Mother   . Diabetes Brother   . Hyperlipidemia Brother   . Hypertension Brother   . Thyroid disease Neg Hx    Social History   Socioeconomic History  . Marital status: Single    Spouse name: Not on file  . Number of children: Not on file  . Years of education: Not on file  . Highest education level: Not on file  Occupational History  . Not on file  Tobacco Use  . Smoking status: Former  . Smokeless tobacco: Former  . Tobacco comments:    quit 28 yrs ago  Vaping Use  . Vaping Use: Never used  Substance and Sexual Activity  . Alcohol use: No  . Drug use: No  . Sexual activity: Never  Other Topics Concern  . Not on file  Social History Narrative   epworth sleepiness scale = 10 (11/15/15)    Lives alone in a 2 story home.  Has 3 children.  Currently not working.  Has lost 6 jobs in the past 2 months due to her leg numbness and pain.     Education: 2 years of college.   Social Determinants of Health   Financial Resource Strain: Not on file  Food Insecurity: Not on file  Transportation Needs: Not on file  Physical Activity: Not on file  Stress: Not on file  Social Connections: Not on file   Past Surgical History:  Procedure Laterality Date  . ABDOMINAL  HYSTERECTOMY     complete  . COLONOSCOPY N/A 01/16/2017   Procedure: COLONOSCOPY;  Surgeon: Carol Ada, MD;  Location: [facility];  Service: Endoscopy;  Laterality: N/A;  . ESOPHAGOGASTRODUODENOSCOPY N/A 01/16/2017   Procedure: ESOPHAGOGASTRODUODENOSCOPY (EGD);  Surgeon: Carol Ada, MD;  Location: [facility];  Service: Endoscopy;  Laterality: N/A;   Past Surgical History:  Procedure Laterality Date  . ABDOMINAL HYSTERECTOMY     complete  . COLONOSCOPY N/A 01/16/2017   Procedure: COLONOSCOPY;  Surgeon: Carol Ada, MD;  Location: [facility];  Service: Endoscopy;  Laterality: N/A;  . ESOPHAGOGASTRODUODENOSCOPY N/A 01/16/2017   Procedure: ESOPHAGOGASTRODUODENOSCOPY (EGD);  Surgeon: Carol Ada, MD;  Location: [facility];  Service: Endoscopy;  Laterality: N/A;   Past Medical History:  Diagnosis Date  . 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 GERD (gastroesophageal reflux disease)   . Hypercholesteremia   . Hypertension   . Migraine   . Pre-diabetes    There were no vitals taken for this visit.  Opioid Risk Score:   Fall Risk Score:  `1  Depression screen PHQ 2/9  Depression screen [facility] 2/9 05/25/2021 04/22/2021 01/27/2021 12/27/2020 11/12/2020 09/17/2020 09/17/2020  Decreased Interest 1 1 1 1  0 1 0  Down,  Depressed, Hopeless 1 1 1 1 1 1  0  PHQ - 2 Score 2 2 2 2 1 2  0  Altered sleeping - - - 1 - - -  Tired, decreased energy - - - - - - -  Change in appetite - - - - - - -  Feeling bad or failure about yourself  - - - - - - -  Trouble concentrating - - - - - - -  Moving slowly or fidgety/restless - - - - - - -  Suicidal thoughts - - - - - - -  PHQ-9 Score - - - 3 - - -     Review of Systems  Musculoskeletal:  Positive for back pain.       Bilateral knee pain  Neurological:  Positive for weakness.  All other systems reviewed and are negative.     Objective:   Physical Exam        Assessment & Plan:

## 2021-07-26 ENCOUNTER — Encounter: Payer: Self-pay | Admitting: Physical Medicine and Rehabilitation

## 2021-07-26 ENCOUNTER — Encounter
Payer: No Typology Code available for payment source | Attending: Physical Medicine and Rehabilitation | Admitting: Physical Medicine and Rehabilitation

## 2021-07-26 ENCOUNTER — Other Ambulatory Visit: Payer: Self-pay

## 2021-07-26 VITALS — BP 165/94 | HR 89 | Temp 97.7°F | Resp 95 | Ht 65.0 in | Wt 307.6 lb

## 2021-07-26 DIAGNOSIS — M1711 Unilateral primary osteoarthritis, right knee: Secondary | ICD-10-CM | POA: Insufficient documentation

## 2021-07-26 DIAGNOSIS — M1712 Unilateral primary osteoarthritis, left knee: Secondary | ICD-10-CM | POA: Diagnosis not present

## 2021-07-26 MED ORDER — MELOXICAM 7.5 MG PO TABS: 15.0000 mg | ORAL_TABLET | Freq: Every day | ORAL | 3 refills | Status: DC

## 2021-07-26 MED ORDER — NITROGLYCERIN 0.4 MG SL SUBL: 0.4000 mg | SUBLINGUAL_TABLET | SUBLINGUAL | 12 refills | Status: DC | PRN

## 2021-07-26 MED ORDER — HYDROCODONE-ACETAMINOPHEN 10-325 MG PO TABS: 1.0000 | ORAL_TABLET | Freq: Three times a day (TID) | ORAL | 0 refills | Status: DC | PRN

## 2021-07-26 NOTE — Progress Notes (Signed)
Knee injection, bilateral  Indication: Bilateral Knee pain not relieved by medication management and other conservative care.  Informed consent was obtained after describing risks and benefits of the procedure with the patient, this includes bleeding, bruising, infection and medication side effects. The patient wishes to proceed and has given written consent. The patient was placed in a recumbent position. The medial aspect of the knee was marked and prepped with Betadine and alcohol. It was then entered with a 25-gauge 1-1/2 inch needle and 1 mL of 1% lidocaine was injected into the skin and subcutaneous tissue. Then another needle was inserted into the knee joint. After negative draw back for blood, a solution containing Zilretta was injected. The patient tolerated the procedure well and it was repeated on the other side. Post procedure instructions were given.

## 2021-07-26 NOTE — Patient Instructions (Signed)
[employer]  Office manager

## 2021-08-10 ENCOUNTER — Ambulatory Visit (HOSPITAL_BASED_OUTPATIENT_CLINIC_OR_DEPARTMENT_OTHER): Payer: Medicare Other | Admitting: Physical Therapy

## 2021-08-21 ENCOUNTER — Other Ambulatory Visit: Payer: Self-pay | Admitting: Physical Medicine and Rehabilitation

## 2021-08-21 MED ORDER — DIHYDROERGOTAMINE MESYLATE 4 MG/ML NA SOLN: 1.0000 | NASAL | 12 refills | Status: DC | PRN

## 2021-08-24 ENCOUNTER — Telehealth: Payer: Self-pay

## 2021-08-24 NOTE — Telephone Encounter (Signed)
PA for  Dihydroergotamine Mesylate for migraine but I don't see in any notes about migraines unless I'm missing it. Need a note to submit PA.

## 2021-08-25 ENCOUNTER — Encounter
Payer: No Typology Code available for payment source | Attending: Physical Medicine and Rehabilitation | Admitting: Registered Nurse

## 2021-08-25 DIAGNOSIS — G43809 Other migraine, not intractable, without status migrainosus: Secondary | ICD-10-CM | POA: Insufficient documentation
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
## 2021-08-29 ENCOUNTER — Ambulatory Visit (HOSPITAL_BASED_OUTPATIENT_CLINIC_OR_DEPARTMENT_OTHER): Payer: Medicare Other | Admitting: Physical Therapy

## 2021-08-30 ENCOUNTER — Other Ambulatory Visit: Payer: Self-pay

## 2021-08-30 ENCOUNTER — Encounter (HOSPITAL_BASED_OUTPATIENT_CLINIC_OR_DEPARTMENT_OTHER): Payer: No Typology Code available for payment source | Admitting: Physical Medicine and Rehabilitation

## 2021-08-30 ENCOUNTER — Encounter: Payer: No Typology Code available for payment source | Admitting: Physical Medicine and Rehabilitation

## 2021-08-30 ENCOUNTER — Telehealth: Payer: Self-pay | Admitting: *Deleted

## 2021-08-30 DIAGNOSIS — G43809 Other migraine, not intractable, without status migrainosus: Secondary | ICD-10-CM | POA: Diagnosis not present
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
## 2021-08-30 NOTE — Telephone Encounter (Signed)
Prior auth submitted to [facility] via CoverMyMeds for [employer].

## 2021-08-31 ENCOUNTER — Encounter: Payer: Self-pay | Admitting: *Deleted

## 2021-08-31 NOTE — Telephone Encounter (Signed)
Request Reference Number: [id number]. DIHYDROERGOT SPR 4MG /ML is approved through 08/20/2022. Your patient may now fill this prescription and it will be covered. Patient and pharmacy notified.

## 2021-08-31 NOTE — Telephone Encounter (Signed)
Error

## 2021-09-06 ENCOUNTER — Encounter: Payer: No Typology Code available for payment source | Admitting: Registered Nurse

## 2021-09-07 ENCOUNTER — Other Ambulatory Visit: Payer: Self-pay

## 2021-09-07 ENCOUNTER — Ambulatory Visit (HOSPITAL_BASED_OUTPATIENT_CLINIC_OR_DEPARTMENT_OTHER): Payer: No Typology Code available for payment source | Admitting: Physical Therapy

## 2021-09-13 ENCOUNTER — Ambulatory Visit (HOSPITAL_BASED_OUTPATIENT_CLINIC_OR_DEPARTMENT_OTHER)
Payer: No Typology Code available for payment source | Attending: Physical Medicine and Rehabilitation | Admitting: Physical Therapy

## 2021-09-13 ENCOUNTER — Encounter (HOSPITAL_BASED_OUTPATIENT_CLINIC_OR_DEPARTMENT_OTHER): Payer: Self-pay

## 2021-09-21 ENCOUNTER — Encounter: Payer: Medicare Other | Attending: Physical Medicine and Rehabilitation | Admitting: Registered Nurse

## 2021-09-21 DIAGNOSIS — G43809 Other migraine, not intractable, without status migrainosus: Secondary | ICD-10-CM | POA: Insufficient documentation

## 2021-09-21 DIAGNOSIS — R001 Bradycardia, unspecified: Secondary | ICD-10-CM | POA: Insufficient documentation

## 2021-09-21 DIAGNOSIS — M1711 Unilateral primary osteoarthritis, right knee: Secondary | ICD-10-CM | POA: Insufficient documentation

## 2021-09-21 DIAGNOSIS — I10 Essential (primary) hypertension: Secondary | ICD-10-CM | POA: Insufficient documentation

## 2021-09-21 DIAGNOSIS — Z79891 Long term (current) use of opiate analgesic: Secondary | ICD-10-CM | POA: Insufficient documentation

## 2021-09-21 DIAGNOSIS — Z5181 Encounter for therapeutic drug level monitoring: Secondary | ICD-10-CM | POA: Insufficient documentation

## 2021-09-21 DIAGNOSIS — G894 Chronic pain syndrome: Secondary | ICD-10-CM | POA: Insufficient documentation

## 2021-09-21 DIAGNOSIS — M1712 Unilateral primary osteoarthritis, left knee: Secondary | ICD-10-CM | POA: Insufficient documentation

## 2021-09-23 ENCOUNTER — Encounter: Payer: Self-pay | Admitting: Registered Nurse

## 2021-09-23 ENCOUNTER — Encounter (HOSPITAL_BASED_OUTPATIENT_CLINIC_OR_DEPARTMENT_OTHER): Payer: Medicare Other | Admitting: Registered Nurse

## 2021-09-23 ENCOUNTER — Other Ambulatory Visit: Payer: Self-pay

## 2021-09-23 VITALS — BP 134/89 | HR 53 | Temp 97.9°F | Ht 65.0 in | Wt 307.0 lb

## 2021-09-23 DIAGNOSIS — Z5181 Encounter for therapeutic drug level monitoring: Secondary | ICD-10-CM

## 2021-09-23 DIAGNOSIS — Z79891 Long term (current) use of opiate analgesic: Secondary | ICD-10-CM | POA: Diagnosis present

## 2021-09-23 DIAGNOSIS — G894 Chronic pain syndrome: Secondary | ICD-10-CM

## 2021-09-23 DIAGNOSIS — R001 Bradycardia, unspecified: Secondary | ICD-10-CM | POA: Diagnosis present

## 2021-09-23 DIAGNOSIS — I10 Essential (primary) hypertension: Secondary | ICD-10-CM

## 2021-09-23 DIAGNOSIS — M1711 Unilateral primary osteoarthritis, right knee: Secondary | ICD-10-CM | POA: Diagnosis present

## 2021-09-23 DIAGNOSIS — G43809 Other migraine, not intractable, without status migrainosus: Secondary | ICD-10-CM

## 2021-09-23 DIAGNOSIS — M1712 Unilateral primary osteoarthritis, left knee: Secondary | ICD-10-CM | POA: Diagnosis present

## 2021-09-23 MED ORDER — HYDROCODONE-ACETAMINOPHEN 10-325 MG PO TABS: 1.0000 | ORAL_TABLET | Freq: Three times a day (TID) | ORAL | 0 refills | Status: DC | PRN

## 2021-09-23 NOTE — Progress Notes (Signed)
Subjective:    Patient ID: Kelsey Santana, female    DOB: 1960-12-31, 61 y.o.   MRN: [id number]  HPI: Kelsey Santana is a 61 y.o. female who returns for follow up appointment for chronic pain and medication refill. She states her pain is located in her bilateral knee R>L, she also reports she has a slight migraine beginning. She rates her pain 9. Her current exercise regime is walking and performing stretching exercises.  Ms. Lose arrived to office with uncontrolled hypertension and bradycardia. Blood Pressure was re-check and apical pulse. Apical Pulse irregular 52. [name] refuses ED/Urgent Care Evaluation, she states she will call her PCP and Cardiologist. She states she is compliant with anti-hypertensive medication  [name] Morphine equivalent is 30.00 MME.   Last UDS was Performed on 04/22/2021, it was consistent.      Pain Inventory Average Pain 9 Pain Right Now 9 My pain is constant, sharp, stabbing, tingling, and aching  In the last 24 hours, has pain interfered with the following? General activity 1 Relation with others 2 Enjoyment of life 1 What TIME of day is your pain at its worst? morning , daytime, evening, and night Sleep (in general) Poor  Pain is worse with: walking, bending, sitting, inactivity, and standing Pain improves with: medication and injections Relief from Meds: 10  Family History  Problem Relation Age of Onset   Alcohol abuse Mother    Diabetes Mother    Hyperlipidemia Mother    Hypertension Mother    Diabetes Brother    Hyperlipidemia Brother    Hypertension Brother    Thyroid disease Neg Hx    Social History   Socioeconomic History   Marital status: Single    Spouse name: Not on file   Number of children: Not on file   Years of education: Not on file   Highest education level: Not on file  Occupational History   Not on file  Tobacco Use   Smoking status: Former   Smokeless tobacco: Former   Tobacco comments:    quit 28  yrs ago  Vaping Use   Vaping Use: Never used  Substance and Sexual Activity   Alcohol use: No   Drug use: No   Sexual activity: Never  Other Topics Concern   Not on file  Social History Narrative   epworth sleepiness scale = 10 (11/15/15)    Lives alone in a 2 story home.  Has 3 children.  Currently not working.  Has lost 6 jobs in the past 2 months due to her leg numbness and pain.     Education: 2 years of college.   Social Determinants of Health   Financial Resource Strain: Not on file  Food Insecurity: Not on file  Transportation Needs: Not on file  Physical Activity: Not on file  Stress: Not on file  Social Connections: Not on file   Past Surgical History:  Procedure Laterality Date   ABDOMINAL HYSTERECTOMY     complete   COLONOSCOPY N/A 01/16/2017   Procedure: COLONOSCOPY;  Surgeon: Carol Ada, MD;  Location: [facility];  Service: Endoscopy;  Laterality: N/A;   ESOPHAGOGASTRODUODENOSCOPY N/A 01/16/2017   Procedure: ESOPHAGOGASTRODUODENOSCOPY (EGD);  Surgeon: Carol Ada, MD;  Location: [facility];  Service: Endoscopy;  Laterality: N/A;   Past Surgical History:  Procedure Laterality Date   ABDOMINAL HYSTERECTOMY     complete   COLONOSCOPY N/A 01/16/2017   Procedure: COLONOSCOPY;  Surgeon: Carol Ada, MD;  Location: [facility];  Service: Endoscopy;  Laterality: N/A;   ESOPHAGOGASTRODUODENOSCOPY N/A 01/16/2017   Procedure: ESOPHAGOGASTRODUODENOSCOPY (EGD);  Surgeon: Carol Ada, MD;  Location: [facility];  Service: Endoscopy;  Laterality: N/A;   Past Medical History:  Diagnosis Date   Back pain    l 4 and l5 djd   GERD (gastroesophageal reflux disease)    Hypercholesteremia    Hypertension    Migraine    Pre-diabetes    BP (!) 155/75    Pulse (!) 55    Temp 97.9 F (36.6 C)    Ht 5\' 5"  (1.651 m)    Wt (!) 307 lb (139.3 kg)    SpO2 98%    BMI 51.09 kg/m   Opioid Risk Score:   Fall Risk Score:  `1  Depression screen PHQ 2/9  Depression screen  [name] 2/9 05/25/2021 04/22/2021 01/27/2021 12/27/2020 11/12/2020 09/17/2020 09/17/2020  Decreased Interest 1 1 1 1  0 1 0  Down, Depressed, Hopeless 1 1 1 1 1 1  0  PHQ - 2 Score 2 2 2 2 1 2  0  Altered sleeping - - - 1 - - -  Tired, decreased energy - - - - - - -  Change in appetite - - - - - - -  Feeling bad or failure about yourself  - - - - - - -  Trouble concentrating - - - - - - -  Moving slowly or fidgety/restless - - - - - - -  Suicidal thoughts - - - - - - -  PHQ-9 Score - - - 3 - - -    Review of Systems  Musculoskeletal:        Pain in both knees  All other systems reviewed and are negative.     Objective:   Physical Exam Vitals and nursing note reviewed.  Constitutional:      Appearance: Normal appearance. She is obese.  Cardiovascular:     Rate and Rhythm: Bradycardia present. Rhythm irregular.  Musculoskeletal:     Cervical back: Normal range of motion and neck supple.     Comments: Normal Muscle Bulk and Muscle Testing Reveals:  Upper Extremities: Full ROM and Muscle Strength 5/5 Right Greater Trochanter Tenderness Lower Extremities: Right: Decreased ROM and Muscle Strength 4/5 Right Lower Extremity Flexion Produces Pain into her Right Patella Left Lower Extremity Flexion: Decreased ROM and Muscle Strength 4/5 Left Lower Extremity Flexion Produces Pain into her Lumbar and Left Patella Arises from Table Slowly Antalgic  Gait     Skin:    General: Skin is warm and dry.  Neurological:     Mental Status: She is alert and oriented to person, place, and time.  Psychiatric:        Mood and Affect: Mood normal.        Behavior: Behavior normal.         Assessment & Plan:  Chronic Low Back Pain without Sciatica/ Right Lumbar Radiculitis: Continue Gabapentin . Continue HEP as Tolerated. Continue to monitor. 09/23/2021 Right Greater Trochanter Tenderness: Continue to Alternate Ice and Heat Therapy. Continue to Monitor. 09/23/2021 Bilateral Primary Osteoarthritis of  Bilateral Knees: R>L:Continue  HEP as Tolerated . Continue to Monitor. 09/23/2021 Chronic Pain Syndrome: Refilled: Hydrocodone 10mg /325 one tablet 4 times a day as needed for pain. #120.  We will continue the opioid monitoring program, this consists of regular clinic visits, examinations, urine drug screen, pill counts as well as use of [place] Controlled Substance Reporting system. A 12 month History has  been reviewed on the [place] Controlled Substance Reporting System on 09/23/2021  5. Uncontrolled Hypertension: Compliant with anti-hypertensive medication. Blood Pressure was re-checked. 6. Bradycardia: Apical Pulse was checked. She refused ED or Urgent Care Evaluation. Ms. Chaudoin will call her PCP to schedule an appointment, she states.  F/U in 1 month

## 2021-09-25 ENCOUNTER — Encounter: Payer: Self-pay | Admitting: Registered Nurse

## 2021-09-25 ENCOUNTER — Emergency Department (HOSPITAL_COMMUNITY)
Admission: EM | Admit: 2021-09-25 | Discharge: 2021-09-25 | Disposition: A | Discharge status: Home / Self Care | Payer: No Typology Code available for payment source | Attending: Emergency Medicine | Admitting: Emergency Medicine

## 2021-09-25 ENCOUNTER — Emergency Department (HOSPITAL_COMMUNITY): Payer: No Typology Code available for payment source

## 2021-09-25 ENCOUNTER — Encounter (HOSPITAL_COMMUNITY): Payer: Self-pay | Admitting: Emergency Medicine

## 2021-09-25 DIAGNOSIS — R42 Dizziness and giddiness: Secondary | ICD-10-CM | POA: Diagnosis not present

## 2021-09-25 DIAGNOSIS — R110 Nausea: Secondary | ICD-10-CM | POA: Diagnosis not present

## 2021-09-25 DIAGNOSIS — R0602 Shortness of breath: Secondary | ICD-10-CM | POA: Diagnosis not present

## 2021-09-25 DIAGNOSIS — R0789 Other chest pain: Secondary | ICD-10-CM | POA: Diagnosis present

## 2021-09-25 DIAGNOSIS — R001 Bradycardia, unspecified: Secondary | ICD-10-CM | POA: Diagnosis not present

## 2021-09-25 DIAGNOSIS — Z79899 Other long term (current) drug therapy: Secondary | ICD-10-CM | POA: Insufficient documentation

## 2021-09-25 LAB — CBC
HCT: 41.9 % (ref 36.0–46.0)
Hemoglobin: 13.2 g/dL (ref 12.0–15.0)
MCH: 28.3 pg (ref 26.0–34.0)
MCHC: 31.5 g/dL (ref 30.0–36.0)
MCV: 89.7 fL (ref 80.0–100.0)
Platelets: 234 10*3/uL (ref 150–400)
RBC: 4.67 MIL/uL (ref 3.87–5.11)
RDW: 13.9 % (ref 11.5–15.5)
WBC: 5.4 10*3/uL (ref 4.0–10.5)
nRBC: 0 % (ref 0.0–0.2)

## 2021-09-25 LAB — BASIC METABOLIC PANEL
Anion gap: 8 (ref 5–15)
BUN: 9 mg/dL (ref 8–23)
CO2: 29 mmol/L (ref 22–32)
Calcium: 8.6 mg/dL — ABNORMAL LOW (ref 8.9–10.3)
Chloride: 105 mmol/L (ref 98–111)
Creatinine, Ser: 0.86 mg/dL (ref 0.44–1.00)
GFR, Estimated: >60 mL/min (ref >60)
Glucose, Bld: 112 mg/dL — ABNORMAL HIGH (ref 70–99)
Potassium: 3.6 mmol/L (ref 3.5–5.1)
Sodium: 142 mmol/L (ref 135–145)

## 2021-09-25 LAB — TROPONIN I (HIGH SENSITIVITY)
Troponin I (High Sensitivity): 6 ng/L (ref <18)
Troponin I (High Sensitivity): 6 ng/L (ref <18)

## 2021-09-25 NOTE — ED Triage Notes (Signed)
Pt. Stated, I went to my pain management Dr. On Friday and told me to come here cause my heart rate was 52 ,but I waited til today. My heart rate was low and ive had chest pain like someone is standing on my chest and SOB. I also have some nausea.

## 2021-09-25 NOTE — ED Notes (Signed)
Portable Xray at bedside.

## 2021-09-25 NOTE — ED Notes (Signed)
Pt ambulated to BR with steady gait.

## 2021-09-25 NOTE — Discharge Instructions (Signed)
Return for any problem.  ?

## 2021-09-25 NOTE — ED Provider Notes (Signed)
[facility] EMERGENCY DEPARTMENT Provider Note   CSN: [id number] Arrival date & time: 09/25/21  1041     History  Chief Complaint  Patient presents with   Dizziness   Chest Pain   Bradycardia    Kelsey Santana is a 61 y.o. female.  61 year old female with a medical history as detailed below presents for evaluation.  Patient reports that she was seen by her pain management doctor on Friday.  Patient was advised that her heart rate was a little bit low in the 50s.  Patient was advised that she developed discomfort she should come to the ED for evaluation.  Patient reports today that she has had intermittent chest discomfort and intermittent pressure ongoing for the last several weeks.  She denies current discomfort.  She denies current shortness of breath.  She reports that with her discomfort in the chest she sometimes feels short of breath.  Sometimes she also has nausea.  She admits that she is anxious about the symptoms.  She denies current discomfort or complaint.  She denies associated fever.  She denies prior documented cardiac problems.  The history is provided by the patient.  Illness Location:  Chest discomfort, reported bradycardia Severity:  Mild Onset quality:  Gradual Duration:  6 weeks Timing:  Intermittent Progression:  Waxing and waning Chronicity:  Recurrent     Home Medications Prior to Admission medications   Medication Sig Start Date End Date Taking? Authorizing Provider  amLODipine (NORVASC) 5 MG tablet Take 1 tablet (5 mg total) by mouth at bedtime. 03/10/21   Raulkar, Clide Deutscher, MD  atorvastatin (LIPITOR) 20 MG tablet Take 1 tablet (20 mg total) by mouth at bedtime. 02/17/19   Dhungel, Flonnie Overman, MD  Cholecalciferol 50 MCG (2000 UT) TABS Take 1 tablet by mouth daily. 03/25/20   [provider]  dexlansoprazole (DEXILANT) 60 MG capsule as directed orally QD for 30 days 01/17/17   [provider]  dihydroergotamine  (MIGRANAL) 4 MG/ML nasal spray Place 1 spray into the nose as needed for migraine. Use in one nostril as directed.  No more than 4 sprays in one hour 08/21/21   Raulkar, Clide Deutscher, MD  gabapentin (NEURONTIN) 300 MG capsule Take 1 capsule (300 mg total) by mouth at bedtime. 05/21/20   Raulkar, Clide Deutscher, MD  hydrochlorothiazide (MICROZIDE) 12.5 MG capsule TAKE 1 CAPSULE(12.5 MG) BY MOUTH DAILY 06/21/21   Hilty, Nadean Corwin, MD  HYDROcodone-acetaminophen (NORCO) 10-325 MG tablet Take 1 tablet by mouth 3 (three) times daily as needed for severe pain. 09/23/21   Bayard Hugger, NP  ibuprofen (ADVIL) 200 MG tablet Take 2 tablets (400 mg total) by mouth every 8 (eight) hours as needed for headache. 02/17/19   Dhungel, Nishant, MD  lidocaine (XYLOCAINE) 5 % ointment Apply 1 application topically See admin instructions. APPLY TO THIGH DAILY AS NEEDED FOR PAIN 02/05/21   Raulkar, Clide Deutscher, MD  LOSARTAN POTASSIUM PO Losartan    [provider]  metFORMIN (GLUCOPHAGE) 500 MG tablet TAKE 1 TABLET(500 MG) BY MOUTH TWICE DAILY 05/04/21   Raulkar, Clide Deutscher, MD  Multiple Vitamins-Minerals (MULTIVITAMIN) tablet Take 1 tablet by mouth daily. 09/19/19   Raulkar, Clide Deutscher, MD  nitroGLYCERIN (NITROSTAT) 0.4 MG SL tablet Place 1 tablet (0.4 mg total) under the tongue every 5 (five) minutes as needed for chest pain. 07/26/21   Raulkar, Clide Deutscher, MD  nortriptyline (PAMELOR) 10 MG capsule Start 10mg  at bedtime for 2 weeks, then increase to  2 tablet at bedtime 06/28/18   Narda Amber K, DO  pantoprazole (PROTONIX) 20 MG tablet Take 1 tablet (20 mg total) by mouth daily as needed. Patient taking differently: Take 40 mg by mouth daily as needed. 04/14/20   Raulkar, Clide Deutscher, MD  predniSONE (STERAPRED UNI-PAK 21 TAB) 10 MG (21) TBPK tablet Take by mouth daily. Take as directed. 10/13/20   Raulkar, Clide Deutscher, MD  Vitamin D, Ergocalciferol, (DRISDOL) 1.25 MG (50000 UNIT) CAPS capsule Take 1 capsule (50,000 Units total) by mouth  every 7 (seven) days. 12/31/19   Raulkar, Clide Deutscher, MD      Allergies    Ciprofloxacin, Hydrocortisone, and Ace inhibitors    Review of Systems   Review of Systems  All other systems reviewed and are negative.  Physical Exam Updated Vital Signs BP (!) 160/76    Pulse (!) 58    Temp 97.6 F (36.4 C) (Oral)    Resp 12    SpO2 100%  Physical Exam Vitals and nursing note reviewed.  Constitutional:      General: She is not in acute distress.    Appearance: Normal appearance. She is well-developed.  HENT:     Head: Normocephalic and atraumatic.  Eyes:     Conjunctiva/sclera: Conjunctivae normal.     Pupils: Pupils are equal, round, and reactive to light.  Cardiovascular:     Rate and Rhythm: Normal rate and regular rhythm.     Heart sounds: Normal heart sounds.  Pulmonary:     Effort: Pulmonary effort is normal. No respiratory distress.     Breath sounds: Normal breath sounds.  Abdominal:     General: There is no distension.     Palpations: Abdomen is soft.     Tenderness: There is no abdominal tenderness.  Musculoskeletal:        General: No deformity. Normal range of motion.     Cervical back: Normal range of motion and neck supple.  Skin:    General: Skin is warm and dry.  Neurological:     General: No focal deficit present.     Mental Status: She is alert and oriented to person, place, and time.    ED Results / Procedures / Treatments   Labs (all labs ordered are listed, but only abnormal results are displayed) Labs Reviewed  BASIC METABOLIC PANEL  CBC  TROPONIN I (HIGH SENSITIVITY)    EKG EKG Interpretation  Date/Time:  Sunday September 25 2021 10:54:49 EST Ventricular Rate:  59 PR Interval:  147 QRS Duration: 98 QT Interval:  427 QTC Calculation: 423 R Axis:   22 Text Interpretation: Sinus rhythm Low voltage, precordial leads Abnormal R-wave progression, early transition Confirmed by Dene Gentry [phone]) on 09/25/2021 10:58:17 AM  Radiology No results  found.  Procedures Procedures    Medications Ordered in ED Medications - No data to display  ED Course/ Medical Decision Making/ A&P                           Medical Decision Making Amount and/or Complexity of Data Reviewed Labs: ordered. Radiology: ordered.    Medical Screen Complete  This patient presented to the ED with complaint of chest discomfort.  This complaint involves an extensive number of treatment options. The initial differential diagnosis includes, but is not limited to, ACS, pulmonary pathology, metabolic abnormality, etc.   This presentation is: Acute, Self-Limited, Previously Undiagnosed, Uncertain Prognosis, Complicated, Systemic Symptoms, and Threat to Life/Bodily  Function  Presented with complaint of atypical chest discomfort.  Describes symptoms are inconsistent with likely ACS.  EKG is without evidence of acute ischemia.  Troponin x2 is barely detectable.  Patient is reassured by work-up findings.  She understands need for close follow-up.  Strict return precautions given and understood.   Additional history obtained:  External records from outside sources obtained and reviewed including prior ED visits and prior Inpatient records.    Lab Tests:  I ordered and personally interpreted labs.  The pertinent results include:  cbc bmp trop   Imaging Studies ordered:  I ordered imaging studies including cxr  I independently visualized and interpreted obtained imaging which showed nad I agree with the radiologist interpretation.   Cardiac Monitoring:  The patient was maintained on a cardiac monitor.  I personally viewed and interpreted the cardiac monitor which showed an underlying rhythm of: nsr  Problem List / ED Course:  Chest discomfort   Reevaluation:  After the interventions noted above, I reevaluated the patient and found that they have: improved   Disposition:  After consideration of the diagnostic results and the patients  response to treatment, I feel that the patent would benefit from close outpatient followup.          Final Clinical Impression(s) / ED Diagnoses Final diagnoses:  Atypical chest pain    Rx / DC Orders ED Discharge Orders     None         Valarie Merino, MD 09/25/21 1434

## 2021-10-08 ENCOUNTER — Other Ambulatory Visit: Payer: Self-pay | Admitting: Physical Medicine and Rehabilitation

## 2021-10-08 MED ORDER — AMLODIPINE BESYLATE 5 MG PO TABS: 5.0000 mg | ORAL_TABLET | Freq: Every day | ORAL | 0 refills | Status: DC

## 2021-10-25 ENCOUNTER — Other Ambulatory Visit: Payer: Self-pay

## 2021-10-25 ENCOUNTER — Encounter
Payer: Medicare Other | Attending: Physical Medicine and Rehabilitation | Admitting: Physical Medicine and Rehabilitation

## 2021-10-25 DIAGNOSIS — G894 Chronic pain syndrome: Secondary | ICD-10-CM | POA: Insufficient documentation

## 2021-10-25 DIAGNOSIS — Z5181 Encounter for therapeutic drug level monitoring: Secondary | ICD-10-CM | POA: Insufficient documentation

## 2021-10-25 DIAGNOSIS — M170 Bilateral primary osteoarthritis of knee: Secondary | ICD-10-CM | POA: Insufficient documentation

## 2021-10-25 DIAGNOSIS — Z79891 Long term (current) use of opiate analgesic: Secondary | ICD-10-CM | POA: Insufficient documentation

## 2021-10-25 MED ORDER — OZEMPIC (0.25 OR 0.5 MG/DOSE) 2 MG/1.5ML ~~LOC~~ SOPN: 0.5000 mg | PEN_INJECTOR | SUBCUTANEOUS | 3 refills | Status: DC

## 2021-10-25 NOTE — Progress Notes (Signed)
Subjective:    Patient ID: Kelsey Santana, female    DOB: [birth year], 61 y.o.   MRN: [id number]  HPI:  An audio/video tele-health visit is felt to be the most appropriate encounter for this patient at this time. This is a follow up tele-visit via phone. The patient is at home. MD is at office. Prior to scheduling this appointment, our staff discussed the limitations of evaluation and management by telemedicine and the availability of in-person appointments. The patient expressed understanding and agreed to proceed.   1) Bilateral knee pain Kelsey Santana is a 61 y.o. female who returns for f/u appointment for chronic pain secondary to bilateral knee osteoarthritis, obesity, and migraines. She states her pain is located in her lower back radiating into her right lower extremity, bilateral hip pain R>L and bilateral knee pain R>L. She rates her pain 7. Her current exercise regime is walking and performing stretching exercises.  [name] Morphine equivalent is 33.33 MME. She is ready for bilateral steroid injections today She is upset about about her weight gain and is consider bypass surgery She has still been drinking sodas She has been sleeping poorly at night She [facility] [employer] She is having severe migraines that have been responsive to [place] in the past. She has failed antidepressants, Depakote, opioids, topamax, Wellbutrin. She has noticied that opioids could trigger migraines when she is further from the dose. She has been taught elimination diet.  -has been very severe -she had trouble walking out of [employer] recently -she is not sure if the Zilretta was as effective as the steroid injections but would still like to try next visit  2) Obesity -she is considering gastric sleeve -she would like to try a medication -she does not want to try phenteramine when I shared that I have a patient who tried it and had a stroke -she would like to try Ozempic as she heard a  friend lost a lot of weight on it and it curbed his appetite -she has been trying to eat healthier     Pain Inventory Average Pain 10 Pain Right Now 7 My pain is constant, sharp, and aching  In the last 24 hours, has pain interfered with the following? General activity 10 Relation with others 8 Enjoyment of life 10 What TIME of day is your pain at its worst? morning , daytime, evening, and night Sleep (in general) Poor  Pain is worse with: walking, bending, sitting, inactivity, standing, and some activites Pain improves with: rest and medication Relief from Meds: 10  Family History  Problem Relation Age of Onset   Alcohol abuse Mother    Diabetes Mother    Hyperlipidemia Mother    Hypertension Mother    Diabetes Brother    Hyperlipidemia Brother    Hypertension Brother    Thyroid disease Neg Hx    Social History   Socioeconomic History   Marital status: Single    Spouse name: Not on file   Number of children: Not on file   Years of education: Not on file   Highest education level: Not on file  Occupational History   Not on file  Tobacco Use   Smoking status: Former   Smokeless tobacco: Former   Tobacco comments:    quit 28 yrs ago  Vaping Use   Vaping Use: Never used  Substance and Sexual Activity   Alcohol use: No   Drug use: No   Sexual activity: Never  Other Topics Concern   Not on file  Social History Narrative   epworth sleepiness scale = 10 (11/15/15)    Lives alone in a 2 story home.  Has 3 children.  Currently not working.  Has lost 6 jobs in the past 2 months due to her leg numbness and pain.     Education: 2 years of college.   Social Determinants of Health   Financial Resource Strain: Not on file  Food Insecurity: Not on file  Transportation Needs: Not on file  Physical Activity: Not on file  Stress: Not on file  Social Connections: Not on file   Past Surgical History:  Procedure Laterality Date   ABDOMINAL HYSTERECTOMY     complete    COLONOSCOPY N/A 01/16/2017   Procedure: COLONOSCOPY;  Surgeon: Carol Ada, MD;  Location: [facility];  Service: Endoscopy;  Laterality: N/A;   ESOPHAGOGASTRODUODENOSCOPY N/A 01/16/2017   Procedure: ESOPHAGOGASTRODUODENOSCOPY (EGD);  Surgeon: Carol Ada, MD;  Location: [facility];  Service: Endoscopy;  Laterality: N/A;   Past Surgical History:  Procedure Laterality Date   ABDOMINAL HYSTERECTOMY     complete   COLONOSCOPY N/A 01/16/2017   Procedure: COLONOSCOPY;  Surgeon: Carol Ada, MD;  Location: [facility];  Service: Endoscopy;  Laterality: N/A;   ESOPHAGOGASTRODUODENOSCOPY N/A 01/16/2017   Procedure: ESOPHAGOGASTRODUODENOSCOPY (EGD);  Surgeon: Carol Ada, MD;  Location: [facility];  Service: Endoscopy;  Laterality: N/A;   Past Medical History:  Diagnosis Date   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  Opioid Risk Score:   Fall Risk Score:  `1  Depression screen PHQ 2/9  Depression screen [facility] 2/9 09/23/2021 05/25/2021 04/22/2021 01/27/2021 12/27/2020 11/12/2020 09/17/2020  Decreased Interest '1 1 1 1 1 '$ 0 1  Down, Depressed, Hopeless '1 1 1 1 1 1 1  '$ PHQ - 2 Score '2 2 2 2 2 1 2  '$ Altered sleeping - - - - 1 - -  Tired, decreased energy - - - - - - -  Change in appetite - - - - - - -  Feeling bad or failure about yourself  - - - - - - -  Trouble concentrating - - - - - - -  Moving slowly or fidgety/restless - - - - - - -  Suicidal thoughts - - - - - - -  PHQ-9 Score - - - - 3 - -    Review of Systems  Musculoskeletal:  Positive for back pain, gait problem and joint swelling.  All other systems reviewed and are negative.     Objective:  Not performed since patient was seen via phone.     Assessment & Plan:  Right Lumbar Radiculitis: Continue Gabapentin . Continue HEP as Tolerated. Continue to monitor.  Bilateral Greater  Trochanter Tenderness: Continue to Alternate Ice and Heat Therapy. Continue to Monitor Bilateral Primary Osteoarthritis of Bilateral Knees: Continue  HEP as Tolerated . Continue to Monitor. Performed steroid injection as below. Plan for Zilretta injections later this month Chronic Pain Syndrome: Refilled: Hydrocodone '10mg'$ /325 one tablet 4 times a day as needed for pain. #120.  We will continue the opioid monitoring program, this consists of regular clinic visits, examinations, urine drug screen, pill counts as well as use of [place] Controlled Substance Reporting system.  5. Obesity:  Weight is 307 lbs: continue shrimp as source of protein. Mercury in salmon may be contributing to her joint symptoms. Advised to stop sodas and made plan to do so. She did not tolerate topamax well. Discussed gastric bypass surgery and gastric sleeve -prescribed ozempic, discussed its mechanism of action, side effects, titrating up dose if well tolerated --Discussed the benefits of intermittent fasting. Recommended starting with pushing dinner 15 minutes earlier and when this feels easy, continuing to push dinner 15 minutes earlier. Discussed that this can help her body to improve its ability to burn fat rather than glucose, improving insulin sensitivity. Recommended drinking Roobois tea in the evening to help curb appetite and for its numerous health benefits.   -discussed we can consider increasing metformin if she does not tolerate ozempic 6. Insomnia: -Try to go outside near sunrise -Get exercise during the day.  -Discussed good sleep hygiene: turning off all devices an hour before bedtime.  -Chamomile tea with dinner.  -Can consider over the counter melatonin  7. Migraines:  -will prescribe migranol for her headaches- this has worked for her in the past and she has failed multiple other medications (listed above)  10 minutes spent in discussion of her bilateral knee pain, response to Zilretta injections and  plan to try again later this month, prescribed ozempic for weight loss, discussed its mechanism of action, potential side effects, -Discussed the benefits of intermittent fasting. Recommended starting with pushing dinner 15 minutes earlier and when this feels easy, continuing to push dinner 15 minutes earlier. Discussed that this can help her body to improve its ability to burn fat rather than glucose, improving insulin sensitivity. Recommended drinking Roobois tea in the evening to help curb appetite and for its numerous health benefits.

## 2021-11-02 ENCOUNTER — Other Ambulatory Visit: Payer: Self-pay | Admitting: Physical Medicine and Rehabilitation

## 2021-11-02 MED ORDER — OZEMPIC (1 MG/DOSE) 4 MG/3ML ~~LOC~~ SOPN: 1.0000 mg | PEN_INJECTOR | SUBCUTANEOUS | 3 refills | Status: DC

## 2021-11-17 ENCOUNTER — Encounter (HOSPITAL_BASED_OUTPATIENT_CLINIC_OR_DEPARTMENT_OTHER): Payer: Medicare Other | Admitting: Physical Medicine and Rehabilitation

## 2021-11-17 ENCOUNTER — Other Ambulatory Visit: Payer: Self-pay | Admitting: Physical Medicine and Rehabilitation

## 2021-11-17 VITALS — BP 126/82 | HR 61 | Ht 65.0 in | Wt 294.0 lb

## 2021-11-17 DIAGNOSIS — M170 Bilateral primary osteoarthritis of knee: Secondary | ICD-10-CM | POA: Diagnosis present

## 2021-11-17 DIAGNOSIS — Z79891 Long term (current) use of opiate analgesic: Secondary | ICD-10-CM | POA: Diagnosis present

## 2021-11-17 DIAGNOSIS — Z5181 Encounter for therapeutic drug level monitoring: Secondary | ICD-10-CM | POA: Diagnosis present

## 2021-11-17 DIAGNOSIS — G894 Chronic pain syndrome: Secondary | ICD-10-CM | POA: Diagnosis present

## 2021-11-17 MED ORDER — VITAMIN D (ERGOCALCIFEROL) 1.25 MG (50000 UNIT) PO CAPS: 50000.0000 [IU] | ORAL_CAPSULE | ORAL | 0 refills | Status: DC

## 2021-11-17 MED ORDER — OZEMPIC (2 MG/DOSE) 8 MG/3ML ~~LOC~~ SOPN: 1.0000 | PEN_INJECTOR | SUBCUTANEOUS | 11 refills | Status: DC

## 2021-11-17 MED ORDER — HYDROCODONE-ACETAMINOPHEN 10-325 MG PO TABS: 1.0000 | ORAL_TABLET | Freq: Four times a day (QID) | ORAL | 0 refills | Status: DC | PRN

## 2021-11-17 NOTE — Progress Notes (Signed)
? ? ? ?Subjective:  ? ? Patient ID: Kelsey Santana, female    DOB: Nov 13, 1960, 61 y.o.   MRN: [id number] ? ?HPI: .  ? ?1) Bilateral knee pain ?Kelsey Santana is a 61 y.o. female who returns for f/u appointment for chronic pain secondary to bilateral knee osteoarthritis, obesity, and migraines. She states her pain is located in her lower back radiating into her right lower extremity, bilateral hip pain R>L and bilateral knee pain R>L. She rates her pain 7. Her current exercise regime is walking and performing stretching exercises. ? ?Kelsey Santana Morphine equivalent is 33.33 MME. ?She is ready for bilateral steroid injections today ?She is upset about about her weight gain and is consider bypass surgery ?Knee pain has been severe ?She has been sleeping poorly at night ?[facility] ?She is having severe migraines that have been responsive to Migranol in the past. She has failed antidepressants, Depakote, opioids, topamax, Wellbutrin. She has noticied that opioids could trigger migraines when she is further from the dose. She has been taught elimination diet.  ?-has been very severe ?-she had trouble walking out of [employer] recently ?-she is not sure if the Zilretta was as effective as the steroid injections but would still like to try next visit ? ?2) Obesity ?-she is considering gastric sleeve ?-she has to make herself eat ?-she has stopped sodas.  ?-she is trying to eat more protein ?-she bought Ensure Max ?-drinks water all day ?-lost 14 lbs since starting Ozempic! ?-only can take a couple of bites until she is full ?-she is trying to avoid bread, meat.  ?-she would like to try a medication ?-she does not want to try phenteramine when I shared that I have a patient who tried it and had a stroke ?-she would like to try Ozempic as she heard a friend lost a lot of weight on it and it curbed his appetite ?-she has been trying to eat healthier ?  ? ? ?Pain Inventory ?Average Pain 10 ?Pain Right Now  9 ?My pain is constant, sharp, and aching ? ?In the last 24 hours, has pain interfered with the following? ?General activity 3 ?Relation with others 4 ?Enjoyment of life 1 ?What TIME of day is your pain at its worst? morning , daytime, evening, and night ?Sleep (in general) Poor ? ?Pain is worse with: walking ?Pain improves with: medication and injections ?Relief from Meds: 9 ? ?Family History  ?Problem Relation Age of Onset  ? Alcohol abuse Mother   ? Diabetes Mother   ? Hyperlipidemia Mother   ? Hypertension Mother   ? Diabetes Brother   ? Hyperlipidemia Brother   ? Hypertension Brother   ? Thyroid disease Neg Hx   ? ?Social History  ? ?Socioeconomic History  ? Marital status: Single  ?  Spouse name: Not on file  ? Number of children: Not on file  ? Years of education: Not on file  ? Highest education level: Not on file  ?Occupational History  ? Not on file  ?Tobacco Use  ? Smoking status: Former  ? Smokeless tobacco: Former  ? Tobacco comments:  ?  quit 28 yrs ago  ?Vaping Use  ? Vaping Use: Never used  ?Substance and Sexual Activity  ? Alcohol use: No  ? Drug use: No  ? Sexual activity: Never  ?Other Topics Concern  ? Not on file  ?Social History Narrative  ? epworth sleepiness scale = 10 (11/15/15)   ?  Lives alone in a 2 story home.  Has 3 children.  Currently not working.  Has lost 6 jobs in the past 2 months due to her leg numbness and pain.    ? Education: 2 years of college.  ? ?Social Determinants of Health  ? ?Financial Resource Strain: Not on file  ?Food Insecurity: Not on file  ?Transportation Needs: Not on file  ?Physical Activity: Not on file  ?Stress: Not on file  ?Social Connections: Not on file  ? ?Past Surgical History:  ?Procedure Laterality Date  ? ABDOMINAL HYSTERECTOMY    ? complete  ? COLONOSCOPY N/A 01/16/2017  ? Procedure: COLONOSCOPY;  Surgeon: Kelsey Ada, MD;  Location: [facility];  Service: Endoscopy;  Laterality: N/A;  ? ESOPHAGOGASTRODUODENOSCOPY N/A 01/16/2017  ? Procedure:  ESOPHAGOGASTRODUODENOSCOPY (EGD);  Surgeon: Kelsey Ada, MD;  Location: [facility];  Service: Endoscopy;  Laterality: N/A;  ? ?Past Surgical History:  ?Procedure Laterality Date  ? ABDOMINAL HYSTERECTOMY    ? complete  ? COLONOSCOPY N/A 01/16/2017  ? Procedure: COLONOSCOPY;  Surgeon: Kelsey Ada, MD;  Location: [facility];  Service: Endoscopy;  Laterality: N/A;  ? ESOPHAGOGASTRODUODENOSCOPY N/A 01/16/2017  ? Procedure: ESOPHAGOGASTRODUODENOSCOPY (EGD);  Surgeon: Kelsey Ada, MD;  Location: [facility];  Service: Endoscopy;  Laterality: N/A;  ? ?Past Medical History:  ?Diagnosis Date  ? Back pain   ? l 4 and l5 djd  ? GERD (gastroesophageal reflux disease)   ? Hypercholesteremia   ? Hypertension   ? Migraine   ? Pre-diabetes   ? ?BP 126/82   Pulse 61   Ht '5\' 5"'$  (1.651 m)   Wt 294 lb (133.4 kg)   SpO2 97%   BMI 48.92 kg/m?  ? ?Opioid Risk Score:   ?Fall Risk Score:  `1 ? ?Depression screen PHQ 2/9 ? ? ?  09/23/2021  ?  8:20 AM 05/25/2021  ?  2:11 PM 04/22/2021  ? 10:37 AM 01/27/2021  ? 11:08 AM 12/27/2020  ? 10:26 AM 11/12/2020  ? 10:43 AM 09/17/2020  ?  3:04 PM  ?Depression screen PHQ 2/9  ?Decreased Interest '1 1 1 1 1 '$ 0 1  ?Down, Depressed, Hopeless '1 1 1 1 1 1 1  '$ ?PHQ - 2 Score '2 2 2 2 2 1 2  '$ ?Altered sleeping     1    ?PHQ-9 Score     3    ?  ?Review of Systems  ?Musculoskeletal:  Positive for back pain, gait problem and joint swelling.  ?     Bilateral knee pain  ?All other systems reviewed and are negative. ? ?   ?Objective:  ?Gen: no distress, normal appearing, BMI 48.92, weight 294 lbs ?HEENT: oral mucosa pink and moist, NCAT ?Cardio: Reg rate ?Chest: normal effort, normal rate of breathing ?Abd: soft, non-distended ?Ext: no edema ?Psych: pleasant, normal affect ?Skin: intact ?Neuro: Alert and oriented x3 ?Musculoskeletal: Antalgic gait without AD, bilateral knee tenderness.  ?   ?Assessment & Plan:  ?Right Lumbar Radiculitis: Continue Gabapentin . Continue HEP as Tolerated. Continue to monitor.  ?Bilateral  Greater Trochanter Tenderness: Continue to Alternate Ice and Heat Therapy. Continue to Monitor ?Bilateral Primary Osteoarthritis of Bilateral Knees: Continue  HEP as Tolerated . Continue to Monitor. Discussed that continued weight loss with also help her knee pain. Performed steroid injection as below. Plan for Zilretta injections later this month ?Chronic Pain Syndrome: Refilled: Hydrocodone '10mg'$ /325 one tablet 4 times a day as needed for pain. #120. Refillled today.  ?We will  continue the opioid monitoring program, this consists of regular clinic visits, examinations, urine drug screen, pill counts as well as use of [place] Controlled Substance Reporting system.  ? ?5. Obesity:  ?-weight is 294 lbs. ?-continue nuts ?-resistance training daily.  ?-recommended protein sources ?-continue avoiding bread.  ?-recommended eating eggs rather than Ensure Max.  ?-continue shrimp as source of protein. Mercury in salmon may be contributing to her joint symptoms. Advised to stop sodas and made plan to do so. She did not tolerate topamax well. Discussed gastric bypass surgery and gastric sleeve ?-prescribed ozempic, discussed its mechanism of action, side effects, increase dose '2mg'$  ?-Discussed the benefits of intermittent fasting. Recommended starting with pushing dinner 15 minutes earlier and when this feels easy, continuing to push dinner 15 minutes earlier. Discussed that this can help her body to improve its ability to burn fat rather than glucose, improving insulin sensitivity. Recommended drinking Roobois tea in the evening to help curb appetite and for its numerous health benefits.   ?-discussed we can consider increasing metformin if she does not tolerate ozempic ? ?6. Insomnia: ?-Try to go outside near sunrise ?-Get exercise during the day.  ?-Discussed good sleep hygiene: turning off all devices an hour before bedtime.  ?-Chamomile tea with dinner.  ?-Can consider over the counter melatonin ? ?7. Migraines:   ?-will prescribe migranol for her headaches- this has worked for her in the past and she has failed multiple other medications (listed above) ? ?8) Vitamin D deficiency: ?-ergocalciferol 50,000U once per week for 7 we

## 2021-11-23 LAB — TOXASSURE SELECT,+ANTIDEPR,UR

## 2021-11-29 ENCOUNTER — Telehealth: Payer: Self-pay | Admitting: *Deleted

## 2021-11-29 NOTE — Telephone Encounter (Signed)
Urine drug screen for this encounter is consistent for prescribed medication 

## 2021-12-12 ENCOUNTER — Encounter: Payer: No Typology Code available for payment source | Admitting: Registered Nurse

## 2021-12-21 ENCOUNTER — Encounter: Payer: Self-pay | Admitting: Registered Nurse

## 2021-12-21 ENCOUNTER — Encounter
Payer: No Typology Code available for payment source | Attending: Physical Medicine and Rehabilitation | Admitting: Registered Nurse

## 2021-12-21 VITALS — BP 144/85 | HR 61 | Ht 65.0 in | Wt 284.0 lb

## 2021-12-21 DIAGNOSIS — Z79891 Long term (current) use of opiate analgesic: Secondary | ICD-10-CM | POA: Diagnosis present

## 2021-12-21 DIAGNOSIS — M170 Bilateral primary osteoarthritis of knee: Secondary | ICD-10-CM | POA: Insufficient documentation

## 2021-12-21 DIAGNOSIS — G894 Chronic pain syndrome: Secondary | ICD-10-CM | POA: Diagnosis present

## 2021-12-21 DIAGNOSIS — Z5181 Encounter for therapeutic drug level monitoring: Secondary | ICD-10-CM | POA: Insufficient documentation

## 2021-12-21 MED ORDER — HYDROCODONE-ACETAMINOPHEN 10-325 MG PO TABS: 1.0000 | ORAL_TABLET | Freq: Four times a day (QID) | ORAL | 0 refills | Status: DC | PRN

## 2021-12-21 NOTE — Progress Notes (Signed)
? ?Subjective:  ? ? Patient ID: Kelsey Santana, female    DOB: 1961-03-11, 61 y.o.   MRN: [id number] ? ?[id number] [name] is a 61 y.o. female who returns for follow up appointment for chronic pain and medication refill. She states her pain is located in her bilateral knees L>R. She rates her pain 4.. Her current exercise regime is walking and performing stretching exercises. ? ?[name] Morphine equivalent is 40.00 MME.   Last UDS was Performed on 03/30/ 2023, It was consistent.  ?  ? ? ?Pain Inventory ?Average Pain 10 ?Pain Right Now 4 ?My pain is constant, sharp, dull, stabbing, and aching ? ?In the last 24 hours, has pain interfered with the following? ?General activity 4 ?Relation with others 4 ?Enjoyment of life 2 ?What TIME of day is your pain at its worst? morning , daytime, evening, and night ?Sleep (in general) Poor ? ?Pain is worse with: walking, bending, sitting, inactivity, and standing ?Pain improves with: medication ?Relief from Meds: 10 ? ?Family History  ?Problem Relation Age of Onset  ? Alcohol abuse Mother   ? Diabetes Mother   ? Hyperlipidemia Mother   ? Hypertension Mother   ? Diabetes Brother   ? Hyperlipidemia Brother   ? Hypertension Brother   ? Thyroid disease Neg Hx   ? ?Social History  ? ?Socioeconomic History  ? Marital status: Single  ?  Spouse name: Not on file  ? Number of children: Not on file  ? Years of education: Not on file  ? Highest education level: Not on file  ?Occupational History  ? Not on file  ?Tobacco Use  ? Smoking status: Former  ? Smokeless tobacco: Former  ? Tobacco comments:  ?  quit 28 yrs ago  ?Vaping Use  ? Vaping Use: Never used  ?Substance and Sexual Activity  ? Alcohol use: No  ? Drug use: No  ? Sexual activity: Never  ?Other Topics Concern  ? Not on file  ?Social History Narrative  ? epworth sleepiness scale = 10 (11/15/15)   ? Lives alone in a 2 story home.  Has 3 children.  Currently not working.  Has lost 6 jobs in the past 2 months due to her leg  numbness and pain.    ? Education: 2 years of college.  ? ?Social Determinants of Health  ? ?Financial Resource Strain: Not on file  ?Food Insecurity: Not on file  ?Transportation Needs: Not on file  ?Physical Activity: Not on file  ?Stress: Not on file  ?Social Connections: Not on file  ? ?Past Surgical History:  ?Procedure Laterality Date  ? ABDOMINAL HYSTERECTOMY    ? complete  ? COLONOSCOPY N/A 01/16/2017  ? Procedure: COLONOSCOPY;  Surgeon: Carol Ada, MD;  Location: [facility];  Service: Endoscopy;  Laterality: N/A;  ? ESOPHAGOGASTRODUODENOSCOPY N/A 01/16/2017  ? Procedure: ESOPHAGOGASTRODUODENOSCOPY (EGD);  Surgeon: Carol Ada, MD;  Location: [facility];  Service: Endoscopy;  Laterality: N/A;  ? ?Past Surgical History:  ?Procedure Laterality Date  ? ABDOMINAL HYSTERECTOMY    ? complete  ? COLONOSCOPY N/A 01/16/2017  ? Procedure: COLONOSCOPY;  Surgeon: Carol Ada, MD;  Location: [facility];  Service: Endoscopy;  Laterality: N/A;  ? ESOPHAGOGASTRODUODENOSCOPY N/A 01/16/2017  ? Procedure: ESOPHAGOGASTRODUODENOSCOPY (EGD);  Surgeon: Carol Ada, MD;  Location: [facility];  Service: Endoscopy;  Laterality: N/A;  ? ?Past Medical History:  ?Diagnosis Date  ? Back pain   ? l 4 and l5 djd  ? GERD (gastroesophageal reflux  disease)   ? Hypercholesteremia   ? Hypertension   ? Migraine   ? Pre-diabetes   ? ?BP (!) 146/82   Pulse 61   Ht '5\' 5"'$  (1.651 m)   Wt 284 lb (128.8 kg)   SpO2 98%   BMI 47.26 kg/m?  ? ?Opioid Risk Score:   ?Fall Risk Score:  `1 ? ?Depression screen PHQ 2/9 ? ? ?  12/21/2021  ?  1:17 PM 11/17/2021  ?  9:54 AM 09/23/2021  ?  8:20 AM 05/25/2021  ?  2:11 PM 04/22/2021  ? 10:37 AM 01/27/2021  ? 11:08 AM 12/27/2020  ? 10:26 AM  ?Depression screen PHQ 2/9  ?Decreased Interest 0 '1 1 1 1 1 1  '$ ?Down, Depressed, Hopeless 0 '1 1 1 1 1 1  '$ ?PHQ - 2 Score 0 '2 2 2 2 2 2  '$ ?Altered sleeping       1  ?PHQ-9 Score       3  ? ? ?Review of Systems  ?Musculoskeletal:   ?     Pain in both knees  ?All other systems  reviewed and are negative. ? ?   ?Objective:  ? Physical Exam ?Vitals and nursing note reviewed.  ?Constitutional:   ?   Appearance: Normal appearance. She is obese.  ?Cardiovascular:  ?   Rate and Rhythm: Normal rate and regular rhythm.  ?   Pulses: Normal pulses.  ?   Heart sounds: Normal heart sounds.  ?Pulmonary:  ?   Effort: Pulmonary effort is normal.  ?   Breath sounds: Normal breath sounds.  ?Musculoskeletal:  ?   Cervical back: Normal range of motion and neck supple.  ?   Comments: Normal Muscle Bulk and Muscle Testing Reveals:  ?Upper Extremities: Full ROM and Muscle Strength 5/5 ?Lower Extremities Full ROM and Muscle Strength 5/5 ?Arises from Table slowly ?Narrow Based  Gait  ?   ?Skin: ?   General: Skin is warm and dry.  ?Neurological:  ?   Mental Status: She is alert and oriented to person, place, and time.  ?Psychiatric:     ?   Mood and Affect: Mood normal.     ?   Behavior: Behavior normal.  ? ? ? ? ?   ?Assessment & Plan:  ?Chronic Low Back Pain without Sciatica/ Right Lumbar Radiculitis: Continue Gabapentin .No complaints today.  Continue HEP as Tolerated. Continue to monitor. 12/21/2021 ?Right Greater Trochanter Tenderness:No complaints today. Continue to Alternate Ice and Heat Therapy. Continue to Monitor. 12/21/2021 ?Bilateral Primary Osteoarthritis of Bilateral Knees: R>L:Scheduled Zilrettaor Cortisone injection with Dr Ranell Patrick. Continue  HEP as Tolerated . Continue to Monitor. 12/21/2021 ?Chronic Pain Syndrome: Refilled: Hydrocodone '10mg'$ /325 one tablet 4 times a day as needed for pain. #120.  ?We will continue the opioid monitoring program, this consists of regular clinic visits, examinations, urine drug screen, pill counts as well as use of [place] Controlled Substance Reporting system. A 12 month History has been reviewed on the [place] Controlled Substance Reporting System on 12/21/2021  ?5.Morbid Obesity: Scheduled for Bypass Surgery on 12/23/2021, she reports.  ? ?F/U in 1  month   ?  ?  ?  ? ?

## 2022-01-16 ENCOUNTER — Other Ambulatory Visit: Payer: Self-pay | Admitting: Physical Medicine and Rehabilitation

## 2022-01-17 ENCOUNTER — Encounter (HOSPITAL_BASED_OUTPATIENT_CLINIC_OR_DEPARTMENT_OTHER): Payer: No Typology Code available for payment source | Admitting: Physical Medicine and Rehabilitation

## 2022-01-17 DIAGNOSIS — G894 Chronic pain syndrome: Secondary | ICD-10-CM | POA: Diagnosis not present

## 2022-01-17 MED ORDER — METFORMIN HCL 500 MG PO TABS: 500.0000 mg | ORAL_TABLET | Freq: Every day | ORAL | 3 refills | Status: DC

## 2022-01-17 MED ORDER — OZEMPIC (2 MG/DOSE) 8 MG/3ML ~~LOC~~ SOPN: 1.0000 | PEN_INJECTOR | SUBCUTANEOUS | 11 refills | Status: DC

## 2022-01-17 MED ORDER — VITAMIN D (ERGOCALCIFEROL) 1.25 MG (50000 UNIT) PO CAPS: 50000.0000 [IU] | ORAL_CAPSULE | ORAL | 0 refills | Status: DC

## 2022-01-17 NOTE — Progress Notes (Signed)
Subjective:    Patient ID: Kelsey Santana, female    DOB: 1960-11-05, 61 y.o.   MRN: [id number]  HPI:  An audio/video tele-health visit is felt to be the most appropriate encounter for this patient at this time. This is a follow up tele-visit via phone. The patient is at home. MD is at office. Prior to scheduling this appointment, our staff discussed the limitations of evaluation and management by telemedicine and the availability of in-person appointments. The patient expressed understanding and agreed to proceed.   1) Bilateral knee pain 2) obesity TONYIA MARSCHALL is a 61 y.o. female who returns for f/u appointment for chronic pain secondary to bilateral knee osteoarthritis, obesity, and migraines. She states her pain is located in her lower back radiating into her right lower extremity, bilateral hip pain R>L and bilateral knee pain R>L. She rates her pain 7. Her current exercise regime is walking and performing stretching exercises.  Ms. Wanek Morphine equivalent is 33.33 MME.  She is upset about about her weight gain and is consider bypass surgery, has lost weight with semalglutide but weight loss has plateued -she is mostly eating fruits now, not eating much protein or vegetables -CBGs dropped while taking semalgutide and metformin so she stopped metformin -CBGs now stable -she is only eating one meal per day -has diffuse bone pain Knee pain has been severe She has been sleeping poorly at night [facility] [employer] She is having severe migraines that have been responsive to [place] in the past. She has failed antidepressants, Depakote, opioids, topamax, Wellbutrin. She has noticied that opioids could trigger migraines when she is further from the dose. She has been taught elimination diet.  -has been very severe -she had trouble walking out of [employer] recently -she is not sure if the Zilretta was as effective as the steroid injections but would still like to try  next visit  -she is considering gastric sleeve -she has to make herself eat -she has stopped sodas.  -she is trying to eat more protein -she bought Ensure Max -drinks water all day -lost 14 lbs since starting Ozempic! -only can take a couple of bites until she is full -she is trying to avoid bread, meat.  -she would like to try a medication -she does not want to try phenteramine when I shared that I have a patient who tried it and had a stroke -she would like to try Ozempic as she heard a friend lost a lot of weight on it and it curbed his appetite -she has been trying to eat healthier     Pain Inventory Average Pain 10 Pain Right Now 9 My pain is constant, sharp, and aching  In the last 24 hours, has pain interfered with the following? General activity 3 Relation with others 4 Enjoyment of life 1 What TIME of day is your pain at its worst? morning , daytime, evening, and night Sleep (in general) Poor  Pain is worse with: walking Pain improves with: medication and injections Relief from Meds: 9  Family History  Problem Relation Age of Onset   Alcohol abuse Mother    Diabetes Mother    Hyperlipidemia Mother    Hypertension Mother    Diabetes Brother    Hyperlipidemia Brother    Hypertension Brother    Thyroid disease Neg Hx    Social History   Socioeconomic History   Marital status: Single    Spouse name: Not on file  Number of children: Not on file   Years of education: Not on file   Highest education level: Not on file  Occupational History   Not on file  Tobacco Use   Smoking status: Former   Smokeless tobacco: Former   Tobacco comments:    quit 28 yrs ago  Vaping Use   Vaping Use: Never used  Substance and Sexual Activity   Alcohol use: No   Drug use: No   Sexual activity: Never  Other Topics Concern   Not on file  Social History Narrative   epworth sleepiness scale = 10 (11/15/15)    Lives alone in a 2 story home.  Has 3 children.  Currently  not working.  Has lost 6 jobs in the past 2 months due to her leg numbness and pain.     Education: 2 years of college.   Social Determinants of Health   Financial Resource Strain: Not on file  Food Insecurity: Not on file  Transportation Needs: Not on file  Physical Activity: Not on file  Stress: Not on file  Social Connections: Not on file   Past Surgical History:  Procedure Laterality Date   ABDOMINAL HYSTERECTOMY     complete   COLONOSCOPY N/A 01/16/2017   Procedure: COLONOSCOPY;  Surgeon: Carol Ada, MD;  Location: [facility];  Service: Endoscopy;  Laterality: N/A;   ESOPHAGOGASTRODUODENOSCOPY N/A 01/16/2017   Procedure: ESOPHAGOGASTRODUODENOSCOPY (EGD);  Surgeon: Carol Ada, MD;  Location: [facility];  Service: Endoscopy;  Laterality: N/A;   Past Surgical History:  Procedure Laterality Date   ABDOMINAL HYSTERECTOMY     complete   COLONOSCOPY N/A 01/16/2017   Procedure: COLONOSCOPY;  Surgeon: Carol Ada, MD;  Location: [facility];  Service: Endoscopy;  Laterality: N/A;   ESOPHAGOGASTRODUODENOSCOPY N/A 01/16/2017   Procedure: ESOPHAGOGASTRODUODENOSCOPY (EGD);  Surgeon: Carol Ada, MD;  Location: [facility];  Service: Endoscopy;  Laterality: N/A;   Past Medical History:  Diagnosis Date   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  Opioid Risk Score:   Fall Risk Score:  `1  Depression screen PHQ 2/9     12/21/2021    1:17 PM 11/17/2021    9:54 AM 09/23/2021    8:20 AM 05/25/2021    2:11 PM 04/22/2021   10:37 AM 01/27/2021   11:08 AM 12/27/2020   10:26 AM  Depression screen PHQ 2/9  Decreased Interest 0 '1 1 1 1 1 1  '$ Down, Depressed, Hopeless 0 '1 1 1 1 1 1  '$ PHQ - 2 Score 0 '2 2 2 2 2 2  '$ Altered sleeping       1  PHQ-9 Score       3    Review of Systems  Musculoskeletal:  Positive for back pain, gait problem and  joint swelling.       Bilateral knee pain  All other systems reviewed and are negative.     Objective:  Not performed     Assessment & Plan:  Right Lumbar Radiculitis: Continue Gabapentin . Continue HEP as Tolerated. Continue to monitor.  Bilateral Greater Trochanter Tenderness: Continue to Alternate Ice and Heat Therapy. Continue to Monitor Bilateral Primary Osteoarthritis of Bilateral Knees: Continue  HEP as Tolerated . Continue to Monitor. Discussed that continued weight loss with also help her  knee pain. Performed steroid injection as below. Reschedule Zilretta to next available.  Chronic Pain Syndrome: Refilled: Hydrocodone '10mg'$ /325 one tablet 4 times a day as needed for pain. #120.  We will continue the opioid monitoring program, this consists of regular clinic visits, examinations, urine drug screen, pill counts as well as use of [place] Controlled Substance Reporting system.   5. Obesity:  -weight is 275 lbs. -continue nuts -discussed eating protein daily -resistance training daily.  -recommended protein sources -continue avoiding bread.  -recommended eating eggs rather than Ensure Max.  -continue shrimp as source of protein. Mercury in salmon may be contributing to her joint symptoms. Advised to stop sodas and made plan to do so. She did not tolerate topamax well. Discussed gastric bypass surgery and gastric sleeve -prescribed ozempic, discussed its mechanism of action, side effects, increase dose '2mg'$  -Discussed the benefits of intermittent fasting. Recommended starting with pushing dinner 15 minutes earlier and when this feels easy, continuing to push dinner 15 minutes earlier. Discussed that this can help her body to improve its ability to burn fat rather than glucose, improving insulin sensitivity. Recommended drinking Roobois tea in the evening to help curb appetite and for its numerous health benefits.   -discussed we can consider increasing metformin if she does not  tolerate ozempic  6. Insomnia: -Try to go outside near sunrise -Get exercise during the day.  -Discussed good sleep hygiene: turning off all devices an hour before bedtime.  -Chamomile tea with dinner.  -Can consider over the counter melatonin  7. Migraines:  -will prescribe migranol for her headaches- this has worked for her in the past and she has failed multiple other medications (listed above)  8) Vitamin D deficiency: -refilled ergocalciferol 50,000U once per week for 14 weeks.   13 minutes spent in discussion of vitamin D deficiency, prescription of ergocalciferol 50,000 units once per week for 7 weeks, discussion of plateau in weight loss, goal of gradual weight loss, refilling metformin, plan for knee replacement

## 2022-01-19 ENCOUNTER — Encounter: Payer: No Typology Code available for payment source | Admitting: Registered Nurse

## 2022-01-24 ENCOUNTER — Other Ambulatory Visit: Payer: Self-pay | Admitting: Physical Medicine and Rehabilitation

## 2022-01-24 MED ORDER — HYDROCODONE-ACETAMINOPHEN 10-325 MG PO TABS: 1.0000 | ORAL_TABLET | Freq: Four times a day (QID) | ORAL | 0 refills | Status: DC | PRN

## 2022-02-13 ENCOUNTER — Encounter
Payer: No Typology Code available for payment source | Attending: Physical Medicine and Rehabilitation | Admitting: Physical Medicine and Rehabilitation

## 2022-02-13 DIAGNOSIS — Z79891 Long term (current) use of opiate analgesic: Secondary | ICD-10-CM | POA: Insufficient documentation

## 2022-02-13 DIAGNOSIS — G894 Chronic pain syndrome: Secondary | ICD-10-CM | POA: Insufficient documentation

## 2022-02-13 DIAGNOSIS — M170 Bilateral primary osteoarthritis of knee: Secondary | ICD-10-CM | POA: Insufficient documentation

## 2022-02-13 DIAGNOSIS — Z5181 Encounter for therapeutic drug level monitoring: Secondary | ICD-10-CM | POA: Insufficient documentation

## 2022-02-21 ENCOUNTER — Other Ambulatory Visit: Payer: Self-pay | Admitting: Physical Medicine and Rehabilitation

## 2022-02-21 MED ORDER — PREDNISONE 10 MG (21) PO TBPK: ORAL_TABLET | Freq: Every day | ORAL | 0 refills | Status: DC

## 2022-02-23 ENCOUNTER — Telehealth: Payer: Self-pay | Admitting: *Deleted

## 2022-02-23 ENCOUNTER — Encounter
Payer: No Typology Code available for payment source | Attending: Physical Medicine and Rehabilitation | Admitting: Physical Medicine and Rehabilitation

## 2022-02-23 DIAGNOSIS — Z79891 Long term (current) use of opiate analgesic: Secondary | ICD-10-CM | POA: Insufficient documentation

## 2022-02-23 DIAGNOSIS — Z5181 Encounter for therapeutic drug level monitoring: Secondary | ICD-10-CM | POA: Insufficient documentation

## 2022-02-23 DIAGNOSIS — G894 Chronic pain syndrome: Secondary | ICD-10-CM | POA: Insufficient documentation

## 2022-02-23 DIAGNOSIS — M170 Bilateral primary osteoarthritis of knee: Secondary | ICD-10-CM | POA: Insufficient documentation

## 2022-02-23 NOTE — Telephone Encounter (Signed)
A letter has been sent via [place] and [employer] with a formal warning about frequent cancellations and no shows. Marland Kitchen

## 2022-03-08 ENCOUNTER — Encounter (HOSPITAL_COMMUNITY): Payer: Self-pay | Admitting: Emergency Medicine

## 2022-03-08 ENCOUNTER — Ambulatory Visit (INDEPENDENT_AMBULATORY_CARE_PROVIDER_SITE_OTHER): Payer: No Typology Code available for payment source

## 2022-03-08 ENCOUNTER — Ambulatory Visit (HOSPITAL_COMMUNITY)
Admission: EM | Admit: 2022-03-08 | Discharge: 2022-03-08 | Disposition: A | Discharge status: Home / Self Care | Payer: No Typology Code available for payment source | Attending: Family Medicine | Admitting: Family Medicine

## 2022-03-08 DIAGNOSIS — M25512 Pain in left shoulder: Secondary | ICD-10-CM

## 2022-03-08 DIAGNOSIS — R6884 Jaw pain: Secondary | ICD-10-CM | POA: Diagnosis not present

## 2022-03-08 MED ORDER — CYCLOBENZAPRINE HCL 10 MG PO TABS: 10.0000 mg | ORAL_TABLET | Freq: Two times a day (BID) | ORAL | 0 refills | Status: DC | PRN

## 2022-03-08 MED ORDER — KETOROLAC TROMETHAMINE 30 MG/ML IJ SOLN
INTRAMUSCULAR | Status: AC
  Filled 2022-03-08: qty 1

## 2022-03-08 MED ORDER — KETOROLAC TROMETHAMINE 30 MG/ML IJ SOLN
30.0000 mg | Freq: Once | INTRAMUSCULAR | Status: AC
  Administered 2022-03-08: 30 mg via INTRAMUSCULAR
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
## 2022-03-08 NOTE — Discharge Instructions (Addendum)
Your x-rays do not show any broken bones  You have been given a shot of Toradol 30 mg today.  Take cyclobenzaprine 10 mg--1 tablet 2 times a day as needed for muscle pain.  You can ice the areas that are sore
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
## 2022-03-16 ENCOUNTER — Other Ambulatory Visit: Payer: Self-pay | Admitting: Nurse Practitioner

## 2022-03-16 ENCOUNTER — Other Ambulatory Visit: Payer: Self-pay | Admitting: *Deleted

## 2022-03-16 DIAGNOSIS — Z1231 Encounter for screening mammogram for malignant neoplasm of breast: Secondary | ICD-10-CM

## 2022-03-31 ENCOUNTER — Ambulatory Visit: Payer: No Typology Code available for payment source | Admitting: Registered Nurse

## 2022-04-04 ENCOUNTER — Encounter: Payer: Self-pay | Admitting: Registered Nurse

## 2022-04-04 ENCOUNTER — Encounter
Payer: No Typology Code available for payment source | Attending: Physical Medicine and Rehabilitation | Admitting: Registered Nurse

## 2022-04-04 VITALS — BP 131/85 | HR 63 | Ht 65.0 in | Wt 270.0 lb

## 2022-04-04 DIAGNOSIS — W19XXXD Unspecified fall, subsequent encounter: Secondary | ICD-10-CM

## 2022-04-04 DIAGNOSIS — M25561 Pain in right knee: Secondary | ICD-10-CM | POA: Diagnosis not present

## 2022-04-04 DIAGNOSIS — M7062 Trochanteric bursitis, left hip: Secondary | ICD-10-CM

## 2022-04-04 DIAGNOSIS — Z5181 Encounter for therapeutic drug level monitoring: Secondary | ICD-10-CM | POA: Diagnosis not present

## 2022-04-04 DIAGNOSIS — Y9301 Activity, walking, marching and hiking: Secondary | ICD-10-CM | POA: Diagnosis not present

## 2022-04-04 DIAGNOSIS — G894 Chronic pain syndrome: Secondary | ICD-10-CM

## 2022-04-04 DIAGNOSIS — M25551 Pain in right hip: Secondary | ICD-10-CM | POA: Insufficient documentation

## 2022-04-04 DIAGNOSIS — M25562 Pain in left knee: Secondary | ICD-10-CM | POA: Diagnosis not present

## 2022-04-04 DIAGNOSIS — M25552 Pain in left hip: Secondary | ICD-10-CM | POA: Diagnosis not present

## 2022-04-04 DIAGNOSIS — M7061 Trochanteric bursitis, right hip: Secondary | ICD-10-CM | POA: Diagnosis not present

## 2022-04-04 DIAGNOSIS — M5416 Radiculopathy, lumbar region: Secondary | ICD-10-CM | POA: Diagnosis present

## 2022-04-04 DIAGNOSIS — Y92009 Unspecified place in unspecified non-institutional (private) residence as the place of occurrence of the external cause: Secondary | ICD-10-CM

## 2022-04-04 DIAGNOSIS — M170 Bilateral primary osteoarthritis of knee: Secondary | ICD-10-CM | POA: Diagnosis not present

## 2022-04-04 DIAGNOSIS — Z6841 Body Mass Index (BMI) 40.0 and over, adult: Secondary | ICD-10-CM | POA: Insufficient documentation

## 2022-04-04 DIAGNOSIS — M1711 Unilateral primary osteoarthritis, right knee: Secondary | ICD-10-CM

## 2022-04-04 DIAGNOSIS — Z760 Encounter for issue of repeat prescription: Secondary | ICD-10-CM | POA: Insufficient documentation

## 2022-04-04 DIAGNOSIS — Z79891 Long term (current) use of opiate analgesic: Secondary | ICD-10-CM | POA: Diagnosis not present

## 2022-04-04 DIAGNOSIS — M1712 Unilateral primary osteoarthritis, left knee: Secondary | ICD-10-CM

## 2022-04-04 MED ORDER — HYDROCODONE-ACETAMINOPHEN 10-325 MG PO TABS: 1.0000 | ORAL_TABLET | Freq: Four times a day (QID) | ORAL | 0 refills | Status: DC | PRN

## 2022-04-04 NOTE — Progress Notes (Signed)
Subjective:    Patient ID: Kelsey Santana, female    DOB: May 26, 1961, 61 y.o.   MRN: [id number]  HPI: Kelsey Santana is a 61 y.o. female who returns for follow up appointment for chronic pain and medication refill. She states her  pain is located in her lower back radiating into her bilateral hips R>L and bilateral knee pain R>L. She rates her pain 8. Her current exercise regime is walking and performing stretching exercises.  Kelsey Santana reports 6 weeks ago she walking in her home when her right knee gave out, she fell backwards and landed on her right side. She states she was able to pick herself up. She didn't seek medical attention.  Kelsey Santana reports a month ago she was walking up the steps, she lost her balanced and fell backwards and landed on her right side. She states her friend helped her up, she didn't seek medical attention. Educated on fall prevention, and for her to use her cane at all times she verbalizes understanding.   Kelsey Santana Morphine equivalent is 40.00 MME.   UDS ordered today.     Pain Inventory Average Pain 8 Pain Right Now 8 My pain is aching  In the last 24 hours, has pain interfered with the following? General activity 3 Relation with others 3 Enjoyment of life 3 What TIME of day is your pain at its worst? morning , daytime, evening, and night Sleep (in general) Poor  Pain is worse with: walking, bending, sitting, inactivity, and standing Pain improves with: medication and injections Relief from Meds: 10  Family History  Problem Relation Age of Onset   Alcohol abuse Mother    Diabetes Mother    Hyperlipidemia Mother    Hypertension Mother    Diabetes Brother    Hyperlipidemia Brother    Hypertension Brother    Thyroid disease Neg Hx    Social History   Socioeconomic History   Marital status: Single    Spouse name: Not on file   Number of children: Not on file   Years of education: Not on file   Highest education level: Not on file   Occupational History   Not on file  Tobacco Use   Smoking status: Former   Smokeless tobacco: Former   Tobacco comments:    quit 28 yrs ago  Vaping Use   Vaping Use: Never used  Substance and Sexual Activity   Alcohol use: No   Drug use: No   Sexual activity: Never  Other Topics Concern   Not on file  Social History Narrative   epworth sleepiness scale = 10 (11/15/15)    Lives alone in a 2 story home.  Has 3 children.  Currently not working.  Has lost 6 jobs in the past 2 months due to her leg numbness and pain.     Education: 2 years of college.   Social Determinants of Health   Financial Resource Strain: Not on file  Food Insecurity: Not on file  Transportation Needs: Not on file  Physical Activity: Not on file  Stress: Not on file  Social Connections: Not on file   Past Surgical History:  Procedure Laterality Date   ABDOMINAL HYSTERECTOMY     complete   COLONOSCOPY N/A 01/16/2017   Procedure: COLONOSCOPY;  Surgeon: Carol Ada, MD;  Location: [facility];  Service: Endoscopy;  Laterality: N/A;   ESOPHAGOGASTRODUODENOSCOPY N/A 01/16/2017   Procedure: ESOPHAGOGASTRODUODENOSCOPY (EGD);  Surgeon: Carol Ada, MD;  Location: [facility];  Service: Endoscopy;  Laterality: N/A;   Past Surgical History:  Procedure Laterality Date   ABDOMINAL HYSTERECTOMY     complete   COLONOSCOPY N/A 01/16/2017   Procedure: COLONOSCOPY;  Surgeon: Carol Ada, MD;  Location: [facility];  Service: Endoscopy;  Laterality: N/A;   ESOPHAGOGASTRODUODENOSCOPY N/A 01/16/2017   Procedure: ESOPHAGOGASTRODUODENOSCOPY (EGD);  Surgeon: Carol Ada, MD;  Location: [facility];  Service: Endoscopy;  Laterality: N/A;   Past Medical History:  Diagnosis Date   Back pain    l 4 and l5 djd   GERD (gastroesophageal reflux disease)    Hypercholesteremia    Hypertension    Migraine    Pre-diabetes    BP 131/85   Pulse 63   Ht '5\' 5"'$  (1.651 m)   Wt 270 lb (122.5 kg)   SpO2 97%   BMI 44.93  kg/m   Opioid Risk Score:   Fall Risk Score:  `1  Depression screen [facility] 2/9     04/04/2022   11:02 AM 12/21/2021    1:17 PM 11/17/2021    9:54 AM 09/23/2021    8:20 AM 05/25/2021    2:11 PM 04/22/2021   10:37 AM 01/27/2021   11:08 AM  Depression screen PHQ 2/9  Decreased Interest 1 0 '1 1 1 1 1  '$ Down, Depressed, Hopeless 1 0 '1 1 1 1 1  '$ PHQ - 2 Score 2 0 '2 2 2 2 2     '$ Review of Systems  Musculoskeletal:        Bilateral leg pain  All other systems reviewed and are negative.     Objective:   Physical Exam Vitals and nursing note reviewed.  Constitutional:      Appearance: Normal appearance. She is obese.  Neck:     Comments: Cervical Pataspinal Tenderness: C-5-C-6 Mainly right side  Cardiovascular:     Rate and Rhythm: Normal rate and regular rhythm.  Pulmonary:     Effort: Pulmonary effort is normal.     Breath sounds: Normal breath sounds.  Musculoskeletal:     Cervical back: Normal range of motion and neck supple.     Comments: Normal Muscle Bulk and Muscle Testing Reveals:  Upper Extremities: Full ROM and Muscle Strength 5/5  Lumbar Paraspinal Tenderness: L-3-L-5 Bilateral Greater Trochanter Tenderness Lower Extremities: Decreased ROM and Muscle Strength 5/5 Right Lower Extremity Flexion Produces Pain into her Right Hip and Right Patella Left Lower Extremity Flexion Produces Pain into her Lower Back Pain and Left Patella Arises from Chair slowly Narrow Based  Gait     Skin:    General: Skin is warm and dry.  Neurological:     Mental Status: She is alert and oriented to person, place, and time.  Psychiatric:        Mood and Affect: Mood normal.        Behavior: Behavior normal.         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 Continue to monitor. 04/04/2022 Bilateral Greater Trochanter Tenderness: Continue to Alternate Ice and Heat Therapy. Continue to Monitor.  04/04/2022 Bilateral Primary Osteoarthritis of Bilateral Knees: . Continue  HEP as Tolerated . Continue current medication regimen as prescribed. Continue to Monitor. 04/04/2022 Chronic Pain Syndrome: Refilled: Hydrocodone '10mg'$ /325 one tablet 4 times a day as needed for pain. #120.  We will continue the opioid monitoring program, this consists of regular clinic visits, examinations, urine drug screen, pill counts as well  as use of [place] Controlled Substance Reporting system. A 12 month History has been reviewed on the [place] Controlled Substance Reporting System on 04/04/2022  5.Morbid Obesity: Continue Healthy Diet Regimen and Continue with HEP as Tolerated. Marland Kitchen 04/04/2022   F/U in 1 month

## 2022-04-07 LAB — TOXASSURE SELECT,+ANTIDEPR,UR

## 2022-04-10 ENCOUNTER — Telehealth: Payer: Self-pay | Admitting: *Deleted

## 2022-04-10 NOTE — Telephone Encounter (Signed)
Urine drug screen for this encounter is consistent for prescribed medication 

## 2022-04-27 ENCOUNTER — Encounter
Payer: No Typology Code available for payment source | Attending: Physical Medicine and Rehabilitation | Admitting: Registered Nurse

## 2022-04-27 DIAGNOSIS — Z6841 Body Mass Index (BMI) 40.0 and over, adult: Secondary | ICD-10-CM | POA: Insufficient documentation

## 2022-04-27 DIAGNOSIS — M25562 Pain in left knee: Secondary | ICD-10-CM | POA: Insufficient documentation

## 2022-04-27 DIAGNOSIS — Z79891 Long term (current) use of opiate analgesic: Secondary | ICD-10-CM | POA: Insufficient documentation

## 2022-04-27 DIAGNOSIS — M25552 Pain in left hip: Secondary | ICD-10-CM | POA: Insufficient documentation

## 2022-04-27 DIAGNOSIS — G894 Chronic pain syndrome: Secondary | ICD-10-CM | POA: Insufficient documentation

## 2022-04-27 DIAGNOSIS — Z760 Encounter for issue of repeat prescription: Secondary | ICD-10-CM | POA: Insufficient documentation

## 2022-04-27 DIAGNOSIS — M25561 Pain in right knee: Secondary | ICD-10-CM | POA: Insufficient documentation

## 2022-04-27 DIAGNOSIS — Z5181 Encounter for therapeutic drug level monitoring: Secondary | ICD-10-CM | POA: Insufficient documentation

## 2022-04-27 DIAGNOSIS — M7061 Trochanteric bursitis, right hip: Secondary | ICD-10-CM | POA: Insufficient documentation

## 2022-04-27 DIAGNOSIS — M25551 Pain in right hip: Secondary | ICD-10-CM | POA: Insufficient documentation

## 2022-04-27 DIAGNOSIS — M7062 Trochanteric bursitis, left hip: Secondary | ICD-10-CM | POA: Insufficient documentation

## 2022-04-27 DIAGNOSIS — M170 Bilateral primary osteoarthritis of knee: Secondary | ICD-10-CM | POA: Insufficient documentation

## 2022-04-27 DIAGNOSIS — M5416 Radiculopathy, lumbar region: Secondary | ICD-10-CM | POA: Insufficient documentation

## 2022-05-01 ENCOUNTER — Other Ambulatory Visit: Payer: Self-pay | Admitting: Physical Medicine and Rehabilitation

## 2022-05-02 ENCOUNTER — Other Ambulatory Visit: Payer: Self-pay | Admitting: Internal Medicine

## 2022-05-02 DIAGNOSIS — H5203 Hypermetropia, bilateral: Secondary | ICD-10-CM | POA: Diagnosis not present

## 2022-05-02 DIAGNOSIS — H524 Presbyopia: Secondary | ICD-10-CM | POA: Diagnosis not present

## 2022-05-02 DIAGNOSIS — H25813 Combined forms of age-related cataract, bilateral: Secondary | ICD-10-CM | POA: Diagnosis not present

## 2022-05-13 ENCOUNTER — Other Ambulatory Visit: Payer: Self-pay | Admitting: Physical Medicine and Rehabilitation

## 2022-06-06 ENCOUNTER — Other Ambulatory Visit: Payer: Self-pay

## 2022-06-06 MED ORDER — HYDROCODONE-ACETAMINOPHEN 10-325 MG PO TABS: 1.0000 | ORAL_TABLET | Freq: Four times a day (QID) | ORAL | 0 refills | Status: DC | PRN

## 2022-06-06 MED ORDER — VITAMIN D (ERGOCALCIFEROL) 1.25 MG (50000 UNIT) PO CAPS: 50000.0000 [IU] | ORAL_CAPSULE | ORAL | 0 refills | Status: DC

## 2022-06-06 MED ORDER — OZEMPIC (1 MG/DOSE) 4 MG/3ML ~~LOC~~ SOPN: 1.0000 mg | PEN_INJECTOR | SUBCUTANEOUS | 3 refills | Status: DC

## 2022-06-06 MED ORDER — CYCLOBENZAPRINE HCL 10 MG PO TABS: 10.0000 mg | ORAL_TABLET | Freq: Two times a day (BID) | ORAL | 0 refills | Status: DC | PRN

## 2022-06-06 MED ORDER — METFORMIN HCL 500 MG PO TABS: 500.0000 mg | ORAL_TABLET | Freq: Every day | ORAL | 3 refills | Status: DC

## 2022-06-08 DIAGNOSIS — I10 Essential (primary) hypertension: Secondary | ICD-10-CM | POA: Diagnosis not present

## 2022-06-08 DIAGNOSIS — R7303 Prediabetes: Secondary | ICD-10-CM | POA: Diagnosis not present

## 2022-06-08 DIAGNOSIS — K219 Gastro-esophageal reflux disease without esophagitis: Secondary | ICD-10-CM | POA: Diagnosis not present

## 2022-06-08 DIAGNOSIS — Z79899 Other long term (current) drug therapy: Secondary | ICD-10-CM | POA: Diagnosis not present

## 2022-06-08 DIAGNOSIS — G43909 Migraine, unspecified, not intractable, without status migrainosus: Secondary | ICD-10-CM | POA: Diagnosis not present

## 2022-06-08 DIAGNOSIS — E559 Vitamin D deficiency, unspecified: Secondary | ICD-10-CM | POA: Diagnosis not present

## 2022-06-08 DIAGNOSIS — M479 Spondylosis, unspecified: Secondary | ICD-10-CM | POA: Diagnosis not present

## 2022-06-08 DIAGNOSIS — R300 Dysuria: Secondary | ICD-10-CM | POA: Diagnosis not present

## 2022-06-08 DIAGNOSIS — M170 Bilateral primary osteoarthritis of knee: Secondary | ICD-10-CM | POA: Diagnosis not present

## 2022-06-08 DIAGNOSIS — Z1159 Encounter for screening for other viral diseases: Secondary | ICD-10-CM | POA: Diagnosis not present

## 2022-06-12 ENCOUNTER — Encounter
Payer: No Typology Code available for payment source | Attending: Physical Medicine and Rehabilitation | Admitting: Physical Medicine and Rehabilitation

## 2022-06-12 ENCOUNTER — Encounter: Payer: Self-pay | Admitting: Physical Medicine and Rehabilitation

## 2022-06-12 ENCOUNTER — Telehealth: Payer: Self-pay

## 2022-06-12 VITALS — BP 137/82 | HR 67 | Ht 65.0 in | Wt 258.0 lb

## 2022-06-12 DIAGNOSIS — M170 Bilateral primary osteoarthritis of knee: Secondary | ICD-10-CM | POA: Insufficient documentation

## 2022-06-12 MED ORDER — HYDROCODONE-ACETAMINOPHEN 10-325 MG PO TABS: 1.0000 | ORAL_TABLET | Freq: Four times a day (QID) | ORAL | 0 refills | Status: DC | PRN

## 2022-06-12 MED ORDER — HYLAN G-F 20 16 MG/2ML IX SOSY
32.0000 mg | PREFILLED_SYRINGE | Freq: Once | INTRA_ARTICULAR | Status: AC
  Administered 2022-06-12: 32 mg via INTRA_ARTICULAR

## 2022-06-12 NOTE — Progress Notes (Signed)
Knee injection, bilateral  Indication: Bilateral Knee pain not relieved by medication management and other conservative care.  Informed consent was obtained after describing risks and benefits of the procedure with the patient, this includes bleeding, bruising, infection and medication side effects. The patient wishes to proceed and has given written consent. The patient was placed in a recumbent position. The medial aspect of the knee was marked and prepped with Betadine and alcohol. It was then entered with a 25-gauge 1-1/2 inch needle and 1 mL of 1% lidocaine was injected into the skin and subcutaneous tissue. Then another needle was inserted into the knee joint. After negative draw back for blood, a solution containing Synvisc was injected. The patient tolerated the procedure well and it was repeated on the other side. Post procedure instructions were given.

## 2022-06-12 NOTE — Telephone Encounter (Signed)
Is pt still being prescribed Nitroglycerin? No indication in notes.

## 2022-06-13 NOTE — Telephone Encounter (Signed)
No, its from [facility]

## 2022-07-04 MED ORDER — HYDROCHLOROTHIAZIDE 12.5 MG PO CAPS: 12.5000 mg | ORAL_CAPSULE | Freq: Every day | ORAL | 0 refills | Status: DC

## 2022-07-04 MED ORDER — NITROGLYCERIN 0.4 MG SL SUBL: 0.4000 mg | SUBLINGUAL_TABLET | SUBLINGUAL | 12 refills | Status: DC | PRN

## 2022-07-04 MED ORDER — PREDNISONE 10 MG (21) PO TBPK: ORAL_TABLET | ORAL | 0 refills | Status: DC

## 2022-07-12 ENCOUNTER — Encounter: Payer: Self-pay | Admitting: Registered Nurse

## 2022-07-12 ENCOUNTER — Encounter
Payer: No Typology Code available for payment source | Attending: Physical Medicine and Rehabilitation | Admitting: Registered Nurse

## 2022-07-12 VITALS — BP 123/77 | HR 62 | Ht 64.0 in | Wt 260.0 lb

## 2022-07-12 DIAGNOSIS — M7062 Trochanteric bursitis, left hip: Secondary | ICD-10-CM

## 2022-07-12 DIAGNOSIS — M7061 Trochanteric bursitis, right hip: Secondary | ICD-10-CM | POA: Diagnosis present

## 2022-07-12 DIAGNOSIS — G894 Chronic pain syndrome: Secondary | ICD-10-CM

## 2022-07-12 DIAGNOSIS — M170 Bilateral primary osteoarthritis of knee: Secondary | ICD-10-CM

## 2022-07-12 DIAGNOSIS — G8929 Other chronic pain: Secondary | ICD-10-CM

## 2022-07-12 DIAGNOSIS — Z79891 Long term (current) use of opiate analgesic: Secondary | ICD-10-CM

## 2022-07-12 DIAGNOSIS — Z5181 Encounter for therapeutic drug level monitoring: Secondary | ICD-10-CM | POA: Diagnosis present

## 2022-07-12 DIAGNOSIS — M545 Low back pain, unspecified: Secondary | ICD-10-CM

## 2022-07-12 NOTE — Progress Notes (Signed)
Subjective:    Patient ID: Kelsey Santana, female    DOB: 1961-08-20, 61 y.o.   MRN: [id number]  HPI: Kelsey Santana is a 61 y.o. female who returns for follow up appointment for chronic pain and medication refill. She states her pain is located in her lower back, bilateral hips and bilateral knee pain, she also reports she has a slight headache, onset this morning.. She rates her pain 5. Her current exercise regime is walking and performing stretching exercises.  Kelsey Santana Morphine equivalent is 34.67 MME.   Last UDS was Performed on 04/04/2022, it was consistent.      Pain Inventory Average Pain 10 Pain Right Now 5 My pain is intermittent, constant, stabbing, and aching  In the last 24 hours, has pain interfered with the following? General activity 10 Relation with others 10 Enjoyment of life 10 What TIME of day is your pain at its worst? morning , daytime, evening, and night Sleep (in general) Poor  Pain is worse with: walking, bending, sitting, inactivity, and standing Pain improves with: medication and injections Relief from Meds: 10      Family History  Problem Relation Age of Onset   Alcohol abuse Mother    Diabetes Mother    Hyperlipidemia Mother    Hypertension Mother    Diabetes Brother    Hyperlipidemia Brother    Hypertension Brother    Thyroid disease Neg Hx    Social History   Socioeconomic History   Marital status: Single    Spouse name: Not on file   Number of children: Not on file   Years of education: Not on file   Highest education level: Not on file  Occupational History   Not on file  Tobacco Use   Smoking status: Former   Smokeless tobacco: Former   Tobacco comments:    quit 28 yrs ago  Vaping Use   Vaping Use: Never used  Substance and Sexual Activity   Alcohol use: No   Drug use: No   Sexual activity: Never  Other Topics Concern   Not on file  Social History Narrative   epworth sleepiness scale = 10 (11/15/15)    Lives  alone in a 2 story home.  Has 3 children.  Currently not working.  Has lost 6 jobs in the past 2 months due to her leg numbness and pain.     Education: 2 years of college.   Social Determinants of Health   Financial Resource Strain: Not on file  Food Insecurity: Not on file  Transportation Needs: Not on file  Physical Activity: Not on file  Stress: Not on file  Social Connections: Not on file   Past Surgical History:  Procedure Laterality Date   ABDOMINAL HYSTERECTOMY     complete   COLONOSCOPY N/A 01/16/2017   Procedure: COLONOSCOPY;  Surgeon: Carol Ada, MD;  Location: [facility];  Service: Endoscopy;  Laterality: N/A;   ESOPHAGOGASTRODUODENOSCOPY N/A 01/16/2017   Procedure: ESOPHAGOGASTRODUODENOSCOPY (EGD);  Surgeon: Carol Ada, MD;  Location: [facility];  Service: Endoscopy;  Laterality: N/A;   Past Medical History:  Diagnosis Date   Back pain    l 4 and l5 djd   GERD (gastroesophageal reflux disease)    Hypercholesteremia    Hypertension    Migraine    Pre-diabetes    BP 123/77   Pulse 62   Ht '5\' 4"'$  (1.626 m)   Wt 260 lb (117.9 kg)   SpO2 97%  BMI 44.63 kg/m   Opioid Risk Score:   Fall Risk Score:  `1  Depression screen [facility] 2/9     07/12/2022   10:16 AM 06/12/2022   11:19 AM 04/04/2022   11:02 AM 12/21/2021    1:17 PM 11/17/2021    9:54 AM 09/23/2021    8:20 AM 05/25/2021    2:11 PM  Depression screen PHQ 2/9  Decreased Interest 0 0 1 0 '1 1 1  '$ Down, Depressed, Hopeless 0 0 1 0 '1 1 1  '$ PHQ - 2 Score 0 0 2 0 '2 2 2    '$ Review of Systems  Musculoskeletal:        Pain in both knees  All other systems reviewed and are negative.      Objective:   Physical Exam Vitals and nursing note reviewed.  Constitutional:      Appearance: Normal appearance.  Cardiovascular:     Rate and Rhythm: Normal rate and regular rhythm.     Pulses: Normal pulses.     Heart sounds: Normal heart sounds.  Pulmonary:     Effort: Pulmonary effort is normal.     Breath  sounds: Normal breath sounds.  Musculoskeletal:     Cervical back: Normal range of motion and neck supple.     Comments: Normal Muscle Bulk and Muscle Testing Reveals:  Upper Extremities: Full ROM and Muscle Strength 5/5 Lumbar Paraspinal Tenderness: L-1-L-3 Bilateral Greater Trochanter Tenderness  Lower Extremities: Full ROM and Muscle Strength 5/5 Arises from Table slowly  Narrow Based  Gait     Skin:    General: Skin is warm and dry.  Neurological:     Mental Status: She is alert and oriented to person, place, and time.  Psychiatric:        Mood and Affect: Mood normal.        Behavior: Behavior normal.         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 Continue to monitor. 07/12/2022 Bilateral Greater Trochanter Tenderness: Continue to Alternate Ice and Heat Therapy. Continue to Monitor. 07/12/2022 Bilateral Primary Osteoarthritis of Bilateral Knees: . Continue  HEP as Tolerated . Continue current medication regimen as prescribed. Continue to Monitor. 07/12/2022 Chronic Pain Syndrome: Refilled: Hydrocodone '10mg'$ /325 one tablet 4 times a day as needed for pain. #120.  We will continue the opioid monitoring program, this consists of regular clinic visits, examinations, urine drug screen, pill counts as well as use of [place] Controlled Substance Reporting system. A 12 month History has been reviewed on the [place] Controlled Substance Reporting System on 07/12/2022  5.Morbid Obesity: Continue Healthy Diet Regimen and Continue with HEP as Tolerated. . 07/12/2022   F/U in 1 month

## 2022-07-17 ENCOUNTER — Other Ambulatory Visit: Payer: Self-pay | Admitting: Physical Medicine and Rehabilitation

## 2022-07-24 ENCOUNTER — Other Ambulatory Visit: Payer: Self-pay | Admitting: Physical Medicine and Rehabilitation

## 2022-08-01 ENCOUNTER — Other Ambulatory Visit: Payer: Self-pay | Admitting: Physical Medicine and Rehabilitation

## 2022-08-01 MED ORDER — HYDROCODONE-ACETAMINOPHEN 10-325 MG PO TABS: 1.0000 | ORAL_TABLET | Freq: Four times a day (QID) | ORAL | 0 refills | Status: DC | PRN

## 2022-08-03 ENCOUNTER — Other Ambulatory Visit: Payer: Self-pay | Admitting: Physical Medicine and Rehabilitation

## 2022-08-04 ENCOUNTER — Other Ambulatory Visit: Payer: Self-pay | Admitting: Physical Medicine and Rehabilitation

## 2022-08-08 ENCOUNTER — Encounter (HOSPITAL_COMMUNITY): Payer: Self-pay

## 2022-08-08 ENCOUNTER — Ambulatory Visit (HOSPITAL_COMMUNITY)
Admission: EM | Admit: 2022-08-08 | Discharge: 2022-08-08 | Disposition: A | Discharge status: Home / Self Care | Payer: No Typology Code available for payment source | Attending: Family Medicine | Admitting: Family Medicine

## 2022-08-08 DIAGNOSIS — L299 Pruritus, unspecified: Secondary | ICD-10-CM

## 2022-08-08 DIAGNOSIS — W57XXXA Bitten or stung by nonvenomous insect and other nonvenomous arthropods, initial encounter: Secondary | ICD-10-CM | POA: Diagnosis not present

## 2022-08-08 MED ORDER — TRIAMCINOLONE ACETONIDE 0.1 % EX CREA: 1.0000 | TOPICAL_CREAM | Freq: Two times a day (BID) | CUTANEOUS | 0 refills | Status: AC

## 2022-08-08 MED ORDER — PREDNISONE 20 MG PO TABS: 40.0000 mg | ORAL_TABLET | Freq: Every day | ORAL | 0 refills | Status: AC

## 2022-08-08 NOTE — Discharge Instructions (Addendum)
You have several insect bites on your arms, chest, and leg.  Take prednisone 20 mg--2 daily for 5 days  Put triamcinolone cream on the bites twice daily until better  Chlor-Trimeton or Benadryl as needed for the itching.

## 2022-08-08 NOTE — ED Provider Notes (Signed)
[facility]    CSN: [id number] Arrival date & time: 08/08/22  1929      History   Chief Complaint Chief Complaint  Patient presents with   Rash   Pruritis    HPI Kelsey Santana is a 61 y.o. female.    Rash  Here for itchy red bumps that she first noted overnight on the evening of December 17.  They are very pruritic and there is one on her right upper arm that is sore.  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  This is the second or third time that she has noted some bites after sleeping on their furniture.  Past Medical History:  Diagnosis Date   Back pain    l 4 and l5 djd   GERD (gastroesophageal reflux disease)    Hypercholesteremia    Hypertension    Migraine    Pre-diabetes     Patient Active Problem List   Diagnosis Date Noted   Chest pain, musculoskeletal    'light-for-dates' infant with signs of fetal malnutrition 06/07/2017   Hypocalcemia 06/07/2017   Vitamin D deficiency 06/07/2017   Chest pain 11/15/2015   Depression 04/15/2012   Morbid obesity due to excess calories ([place]) 04/15/2012   Tinea corporis 04/15/2012   Fatigue 04/15/2012   Epigastric pain 03/19/2012   Migraine headache 03/11/2012   GERD (gastroesophageal reflux disease) 03/11/2012   HTN (hypertension) 03/11/2012   HLD (hyperlipidemia) 03/11/2012    Past Surgical History:  Procedure Laterality Date   ABDOMINAL HYSTERECTOMY     complete   COLONOSCOPY N/A 01/16/2017   Procedure: COLONOSCOPY;  Surgeon: Carol Ada, MD;  Location: [facility];  Service: Endoscopy;  Laterality: N/A;   ESOPHAGOGASTRODUODENOSCOPY N/A 01/16/2017   Procedure: ESOPHAGOGASTRODUODENOSCOPY (EGD);  Surgeon: Carol Ada, MD;  Location: [facility];  Service: Endoscopy;  Laterality: N/A;    OB History   No obstetric history on file.      Home Medications    Prior to  Admission medications   Medication Sig Start Date End Date Taking? Authorizing Provider  predniSONE (DELTASONE) 20 MG tablet Take 2 tablets (40 mg total) by mouth daily with breakfast for 5 days. 08/08/22 08/13/22 Yes Barrett Henle, MD  triamcinolone cream (KENALOG) 0.1 % Apply 1 Application topically 2 (two) times daily. To affected area till better 08/08/22  Yes Tangia Pinard, Gwenlyn Perking, MD  amLODipine (NORVASC) 5 MG tablet     [provider]  Cholecalciferol 125 MCG (5000 UT) TABS Take 1 tablet every day by oral route.    [provider]  Cholecalciferol 50 MCG (2000 UT) TABS Take 1 tablet by mouth daily. 03/25/20   [provider]  cyclobenzaprine (FLEXERIL) 10 MG tablet Take 1 tablet (10 mg total) by mouth 2 (two) times daily as needed for muscle spasms. 06/06/22   Raulkar, Clide Deutscher, MD  dihydroergotamine (MIGRANAL) 4 MG/ML nasal spray Place 1 spray into the nose as needed for migraine. Use in one nostril as directed.  No more than 4 sprays in one hour 08/21/21   Raulkar, Clide Deutscher, MD  hydrochlorothiazide (MICROZIDE) 12.5 MG capsule TAKE ONE CAPSULE BY MOUTH DAILY AT 9AM SCHEDULE OFFICE VISIT FOR FUTURE REFILLS 08/04/22   Raulkar, Clide Deutscher, MD  HYDROcodone-acetaminophen (NORCO) 10-325 MG tablet Take 1 tablet by mouth every 6 (six) hours as needed for  severe pain. 08/01/22   Raulkar, Clide Deutscher, MD  ibuprofen (ADVIL) 200 MG tablet Take 2 tablets (400 mg total) by mouth every 8 (eight) hours as needed for headache. 02/17/19   Dhungel, Nishant, MD  lidocaine (XYLOCAINE) 5 % ointment Apply 1 application topically See admin instructions. APPLY TO THIGH DAILY AS NEEDED FOR PAIN 02/05/21   Raulkar, Clide Deutscher, MD  metFORMIN (GLUCOPHAGE) 500 MG tablet Take 1 tablet (500 mg total) by mouth daily with breakfast. 06/06/22   Raulkar, Clide Deutscher, MD  Multiple Vitamins-Minerals (MULTIVITAMIN) tablet Take 1 tablet by mouth daily. 09/19/19   Raulkar, Clide Deutscher, MD  nitroGLYCERIN (NITROSTAT)  0.4 MG SL tablet Place 1 tablet (0.4 mg total) under the tongue every 5 (five) minutes as needed for chest pain. 07/26/21   Raulkar, Clide Deutscher, MD  nitroGLYCERIN (NITROSTAT) 0.4 MG SL tablet Place 1 tablet (0.4 mg total) under the tongue every 5 (five) minutes as needed for chest pain. 07/04/22   Raulkar, Clide Deutscher, MD  OZEMPIC, 1 MG/DOSE, 4 MG/3ML SOPN Inject 1 mg into the skin once a week. 06/06/22   Raulkar, Clide Deutscher, MD  pantoprazole (PROTONIX) 20 MG tablet pantoprazole 20 mg tablet,delayed release  Take 1 tablet every day by oral route.    [provider]  Semaglutide, 2 MG/DOSE, (OZEMPIC, 2 MG/DOSE,) 8 MG/3ML SOPN Inject 1 each into the skin once a week. 01/17/22   Raulkar, Clide Deutscher, MD  Vitamin D, Ergocalciferol, (DRISDOL) 1.25 MG (50000 UNIT) CAPS capsule Take 1 capsule (50,000 Units total) by mouth every 7 (seven) days. 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     Physical Exam Triage Vital Signs ED Triage Vitals [08/08/22 2020]  Enc Vitals Group     BP (!) 162/89     Pulse Rate 79     Resp 16     Temp 97.9 F (36.6 C)     Temp Source Oral     SpO2 99 %     Weight      Height       Head Circumference      Peak Flow      Pain Score      Pain Loc      Pain Edu?      Excl. in [place]?    No data found.  Updated Vital Signs BP (!) 162/89 (BP Location: Left Arm)   Pulse 79   Temp 97.9 F (36.6 C) (Oral)   Resp 16   SpO2 99%   Visual  Acuity Right Eye Distance:   Left Eye Distance:   Bilateral Distance:    Right Eye Near:   Left Eye Near:    Bilateral Near:     Physical Exam Vitals reviewed.  Constitutional:      General: She is not in acute distress.    Appearance: She is not ill-appearing, toxic-appearing or diaphoretic.  HENT:     Mouth/Throat:     Mouth: Mucous membranes are moist.     Pharynx: No oropharyngeal exudate or posterior oropharyngeal erythema.  Eyes:     Extraocular Movements: Extraocular movements intact.     Conjunctiva/sclera: Conjunctivae normal.     Pupils: Pupils are equal, round, and reactive to light.  Cardiovascular:     Rate and Rhythm: Normal rate and regular rhythm.     Heart sounds: No murmur heard. Pulmonary:     Effort: Pulmonary effort is normal. No respiratory distress.     Breath sounds: Normal breath sounds. No stridor. No wheezing, rhonchi or rales.  Musculoskeletal:     Cervical back: Neck supple.  Lymphadenopathy:     Cervical: No cervical adenopathy.  Skin:    Capillary Refill: Capillary refill takes less than 2 seconds.     Coloration: Skin is not jaundiced or pale.     Comments: There are erythematous bumps, most of them about half a centimeter in diameter and some are a centimeter in diameter.  They are on her posterior upper right arm.  And then there is a area of induration and erythema about 2 cm in diameter more distal on the right upper arm.  No fluctuance.  No eschar.  There are similar lesions on her left breast and on her left upper thigh.  Neurological:     General: No focal deficit present.     Mental Status: She is alert and oriented to person, place, and time.  Psychiatric:        Behavior:  Behavior normal.      UC Treatments / Results  Labs (all labs ordered are listed, but only abnormal results are displayed) Labs Reviewed - No data to display  EKG   Radiology No results found.  Procedures Procedures (including critical care time)  Medications Ordered in UC Medications - No data to display  Initial Impression / Assessment and Plan / UC Course  I have reviewed the triage vital signs and the nursing notes.  Pertinent labs & imaging results that were available during my care of the patient were reviewed by me and considered in my medical decision making (see chart for details).        The patient wants me to say with certainty that these are bedbug bites.  I have informed her that I think they are insect bites and I do not know if they are from bedbugs.  Prednisone is sent for and also some topical triamcinolone. Final Clinical Impressions(s) / UC Diagnoses   Final diagnoses:  Pruritus  Insect bite, unspecified site, initial encounter     Discharge Instructions      You have several insect bites on your arms, chest, and leg.  Take prednisone 20 mg--2 daily for 5 days  Put triamcinolone cream on the bites twice daily until better  Chlor-Trimeton or Benadryl as needed for the itching.       ED Prescriptions     Medication Sig Dispense Auth. Provider   predniSONE (DELTASONE) 20 MG tablet Take 2 tablets (40 mg total) by mouth daily with breakfast  for 5 days. 10 tablet Barrett Henle, MD   triamcinolone cream (KENALOG) 0.1 % Apply 1 Application topically 2 (two) times daily. To affected area till better 80 g Windy Carina Gwenlyn Perking, MD      PDMP not reviewed this encounter.   Barrett Henle, MD 08/08/22 2101

## 2022-08-08 NOTE — ED Triage Notes (Signed)
Pt reports itching all over her body. 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

## 2022-08-17 ENCOUNTER — Encounter: Payer: No Typology Code available for payment source | Admitting: Physical Medicine and Rehabilitation

## 2022-08-22 ENCOUNTER — Other Ambulatory Visit: Payer: Self-pay | Admitting: Physical Medicine and Rehabilitation

## 2022-08-22 ENCOUNTER — Encounter
Payer: No Typology Code available for payment source | Attending: Physical Medicine and Rehabilitation | Admitting: Physical Medicine and Rehabilitation

## 2022-08-22 ENCOUNTER — Encounter: Payer: Self-pay | Admitting: Physical Medicine and Rehabilitation

## 2022-08-22 VITALS — BP 150/86 | HR 70 | Ht 64.0 in | Wt 251.0 lb

## 2022-08-22 DIAGNOSIS — G894 Chronic pain syndrome: Secondary | ICD-10-CM

## 2022-08-22 DIAGNOSIS — Z5181 Encounter for therapeutic drug level monitoring: Secondary | ICD-10-CM | POA: Insufficient documentation

## 2022-08-22 DIAGNOSIS — Z79891 Long term (current) use of opiate analgesic: Secondary | ICD-10-CM | POA: Insufficient documentation

## 2022-08-22 MED ORDER — HYDROCODONE-ACETAMINOPHEN 10-325 MG PO TABS: 1.0000 | ORAL_TABLET | Freq: Four times a day (QID) | ORAL | 0 refills | Status: DC | PRN

## 2022-08-22 NOTE — Progress Notes (Signed)
Subjective:    Patient ID: Kelsey Santana, female    DOB: 1961/08/06, 62 y.o.   MRN: [id number]  HPI:   1) Bilateral knee pain 2) obesity Kelsey Santana is a 62 y.o. female who returns for f/u appointment for chronic pain secondary to bilateral knee osteoarthritis, obesity, and migraines. She states her pain is located in her lower back radiating into her right lower extremity, bilateral hip pain R>L and bilateral knee pain R>L. She rates her pain 7. Her current exercise regime is walking and performing stretching exercises. -she lost a lot of weight -she stopped eating sugars.  -right knee is feeling better but left is hurting more.  -right knee aches more at night -can't climb up stairs -she has to order groceries from [place]  Ms. [place] Morphine equivalent is 33.33 MME.  She is upset about about her weight gain and is consider bypass surgery, has lost weight with semalglutide but weight loss has plateued -she is mostly eating fruits now, not eating much protein or vegetables -CBGs dropped while taking semalgutide and metformin so she stopped metformin -CBGs now stable -she is only eating one meal per day -has diffuse bone pain Knee pain has been severe She has been sleeping poorly at night She [facility] [employer] She is having severe migraines that have been responsive to [place] in the past. She has failed antidepressants, Depakote, opioids, topamax, Wellbutrin. She has noticied that opioids could trigger migraines when she is further from the dose. She has been taught elimination diet.  -has been very severe -she had trouble walking out of [employer] recently -she is not sure if the Zilretta was as effective as the steroid injections but would still like to try next visit  -she is considering gastric sleeve -she has to make herself eat -she has stopped sodas.  -she is trying to eat more protein -she bought Ensure Max -drinks water all day -lost 14 lbs since  starting Ozempic! -only can take a couple of bites until she is full -she is trying to avoid bread, meat.  -she would like to try a medication -she does not want to try phenteramine when I shared that I have a patient who tried it and had a stroke -she would like to try Ozempic as she heard a friend lost a lot of weight on it and it curbed his appetite -she has been trying to eat healthier     Pain Inventory Average Pain 10 Pain Right Now 9 My pain is constant, sharp, and aching  In the last 24 hours, has pain interfered with the following? General activity 3 Relation with others 4 Enjoyment of life 1 What TIME of day is your pain at its worst? morning , daytime, evening, and night Sleep (in general) Poor  Pain is worse with: walking Pain improves with: medication and injections Relief from Meds: 9  Family History  Problem Relation Age of Onset   Alcohol abuse Mother    Diabetes Mother    Hyperlipidemia Mother    Hypertension Mother    Diabetes Brother    Hyperlipidemia Brother    Hypertension Brother    Thyroid disease Neg Hx    Social History   Socioeconomic History   Marital status: Single    Spouse name: Not on file   Number of children: Not on file   Years of education: Not on file   Highest education level: Not on file  Occupational History  Not on file  Tobacco Use   Smoking status: Former   Smokeless tobacco: Former   Tobacco comments:    quit 28 yrs ago  Vaping Use   Vaping Use: Never used  Substance and Sexual Activity   Alcohol use: No   Drug use: No   Sexual activity: Never  Other Topics Concern   Not on file  Social History Narrative   epworth sleepiness scale = 10 (11/15/15)    Lives alone in a 2 story home.  Has 3 children.  Currently not working.  Has lost 6 jobs in the past 2 months due to her leg numbness and pain.     Education: 2 years of college.   Social Determinants of Health   Financial Resource Strain: Not on file  Food  Insecurity: Not on file  Transportation Needs: Not on file  Physical Activity: Not on file  Stress: Not on file  Social Connections: Not on file   Past Surgical History:  Procedure Laterality Date   ABDOMINAL HYSTERECTOMY     complete   COLONOSCOPY N/A 01/16/2017   Procedure: COLONOSCOPY;  Surgeon: Carol Ada, MD;  Location: [facility];  Service: Endoscopy;  Laterality: N/A;   ESOPHAGOGASTRODUODENOSCOPY N/A 01/16/2017   Procedure: ESOPHAGOGASTRODUODENOSCOPY (EGD);  Surgeon: Carol Ada, MD;  Location: [facility];  Service: Endoscopy;  Laterality: N/A;   Past Surgical History:  Procedure Laterality Date   ABDOMINAL HYSTERECTOMY     complete   COLONOSCOPY N/A 01/16/2017   Procedure: COLONOSCOPY;  Surgeon: Carol Ada, MD;  Location: [facility];  Service: Endoscopy;  Laterality: N/A;   ESOPHAGOGASTRODUODENOSCOPY N/A 01/16/2017   Procedure: ESOPHAGOGASTRODUODENOSCOPY (EGD);  Surgeon: Carol Ada, MD;  Location: [facility];  Service: Endoscopy;  Laterality: N/A;   Past Medical History:  Diagnosis Date   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  Opioid Risk Score:   Fall Risk Score:  `1  Depression screen [facility] 2/9     07/12/2022   10:16 AM 06/12/2022   11:19 AM 04/04/2022   11:02 AM 12/21/2021    1:17 PM 11/17/2021    9:54 AM 09/23/2021    8:20 AM 05/25/2021    2:11 PM  Depression screen PHQ 2/9  Decreased Interest 0 0 1 0 '1 1 1  '$ Down, Depressed, Hopeless 0 0 1 0 '1 1 1  '$ PHQ - 2 Score 0 0 2 0 '2 2 2    '$ Review of Systems  Musculoskeletal:  Positive for back pain, gait problem and joint swelling.       Bilateral knee pain  All other systems reviewed and are negative.     Objective:  Gen: no distress, normal appearing, weight is 251 lbs, BMI 43.08, BP 150/86 HEENT: oral mucosa pink and moist, NCAT Cardio: Reg rate Chest:  normal effort, normal rate of breathing Abd: soft, non-distended Ext: no edema Psych: pleasant, normal affect Skin: intact Neuro: TTP bilateral knees    Assessment & Plan:  Right Lumbar Radiculitis: Continue Gabapentin . Continue HEP as Tolerated. Continue to monitor.  Bilateral Greater Trochanter Tenderness: Continue to Alternate Ice and Heat Therapy. Continue to Monitor Bilateral Primary Osteoarthritis of Bilateral Knees: Continue  HEP as Tolerated . Continue to Monitor. Discussed that continued weight loss with also help her knee pain. Performed steroid  injection as below. Reschedule Zilretta to next available.  Chronic Pain Syndrome: Continue Hydrocodone '10mg'$ /325 one tablet 4 times a day as needed for pain. #120.  We will continue the opioid monitoring program, this consists of regular clinic visits, examinations, urine drug screen, pill counts as well as use of [place] Controlled Substance Reporting system.   5. Obesity:  -weight is 251 lbs -encouraged stopping eating added sugars -continue nuts -discussed eating protein daily -resistance training daily.  -recommended protein sources -continue avoiding bread.  -recommended eating eggs rather than Ensure Max.  -continue shrimp as source of protein. Mercury in salmon may be contributing to her joint symptoms. Advised to stop sodas and made plan to do so. She did not tolerate topamax well. Discussed gastric bypass surgery and gastric sleeve -prescribed ozempic, discussed its mechanism of action, side effects, increase dose '2mg'$  -Discussed the benefits of intermittent fasting. Recommended starting with pushing dinner 15 minutes earlier and when this feels easy, continuing to push dinner 15 minutes earlier. Discussed that this can help her body to improve its ability to burn fat rather than glucose, improving insulin sensitivity. Recommended drinking [place] tea in the evening to help curb appetite and for its numerous health benefits.    -discussed we can consider increasing metformin if she does not tolerate ozempic  6. Insomnia: -Try to go outside near sunrise -Get exercise during the day.  -Turn off all devices an hour before bedtime.  -Teas that can benefit: chamomile, valerian root, Brahmi (Bacopa) -Can consider over the counter melatonin, magnesium, and/or L-theanine. Melatonin is an anti-oxidant with multiple health benefits. Magnesium is involved in greater than 300 enzymatic reactions in the body and most of [place] are deficient as our soil is often depleted. There are 7 different types of magnesium- Bioptemizer's is a supplement with all 7 types, and each has unique benefits. Magnesium can also help with constipation and anxiety.  -Pistachios naturally increase the production of melatonin -Cozy Earth bamboo bed sheets are free from toxic chemicals.  -Tart cherry juice or a tart cherry supplement can improve sleep and soreness post-workout   7. Migraines:  -will prescribe migranol for her headaches- this has worked for her in the past and she has failed multiple other medications (listed above)  8) Vitamin D deficiency: -Continue ergocalciferol 50,000U once per week for 14 weeks.

## 2022-08-22 NOTE — Patient Instructions (Signed)

## 2022-08-25 ENCOUNTER — Other Ambulatory Visit: Payer: Self-pay | Admitting: Physical Medicine and Rehabilitation

## 2022-08-26 LAB — TOXASSURE SELECT,+ANTIDEPR,UR

## 2022-09-13 ENCOUNTER — Other Ambulatory Visit: Payer: Self-pay | Admitting: Physical Medicine and Rehabilitation

## 2022-09-13 MED ORDER — PAXLOVID (300/100) 20 X 150 MG & 10 X 100MG PO TBPK: 3.0000 | ORAL_TABLET | Freq: Two times a day (BID) | ORAL | 0 refills | Status: AC

## 2022-09-14 ENCOUNTER — Other Ambulatory Visit: Payer: Self-pay | Admitting: Physical Medicine and Rehabilitation

## 2022-09-14 DIAGNOSIS — Z634 Disappearance and death of family member: Secondary | ICD-10-CM

## 2022-09-15 ENCOUNTER — Telehealth: Payer: Self-pay

## 2022-09-15 NOTE — Telephone Encounter (Signed)
[place] Note  RN attempted to contact to schedule initial PC visit. No answer. Left voicemail with contact info and request for return call.   Jacqulyn Cane, RN

## 2022-09-19 ENCOUNTER — Telehealth: Payer: Self-pay

## 2022-09-19 NOTE — Telephone Encounter (Signed)
(  12:11 pm) PC SW left a message for patient requesting a call back to discuss palliative care services and schedule a visit.

## 2022-09-21 ENCOUNTER — Telehealth: Payer: Self-pay

## 2022-09-21 NOTE — Telephone Encounter (Signed)
(  4:24 pm) PC SW 3rd attempt to contact patient to no avail. Patient referral will be remove from active list due to being unable to contact patient.

## 2022-09-22 ENCOUNTER — Encounter: Payer: Self-pay | Admitting: Registered Nurse

## 2022-09-22 ENCOUNTER — Encounter
Payer: No Typology Code available for payment source | Attending: Physical Medicine and Rehabilitation | Admitting: Registered Nurse

## 2022-09-22 VITALS — BP 130/78 | HR 55 | Ht 64.0 in | Wt 251.0 lb

## 2022-09-22 DIAGNOSIS — Z760 Encounter for issue of repeat prescription: Secondary | ICD-10-CM | POA: Insufficient documentation

## 2022-09-22 DIAGNOSIS — G894 Chronic pain syndrome: Secondary | ICD-10-CM | POA: Diagnosis not present

## 2022-09-22 DIAGNOSIS — Z5181 Encounter for therapeutic drug level monitoring: Secondary | ICD-10-CM | POA: Insufficient documentation

## 2022-09-22 DIAGNOSIS — Y92009 Unspecified place in unspecified non-institutional (private) residence as the place of occurrence of the external cause: Secondary | ICD-10-CM | POA: Insufficient documentation

## 2022-09-22 DIAGNOSIS — M25552 Pain in left hip: Secondary | ICD-10-CM | POA: Insufficient documentation

## 2022-09-22 DIAGNOSIS — G43909 Migraine, unspecified, not intractable, without status migrainosus: Secondary | ICD-10-CM | POA: Insufficient documentation

## 2022-09-22 DIAGNOSIS — G8929 Other chronic pain: Secondary | ICD-10-CM

## 2022-09-22 DIAGNOSIS — W19XXXD Unspecified fall, subsequent encounter: Secondary | ICD-10-CM | POA: Insufficient documentation

## 2022-09-22 DIAGNOSIS — R001 Bradycardia, unspecified: Secondary | ICD-10-CM | POA: Insufficient documentation

## 2022-09-22 DIAGNOSIS — M170 Bilateral primary osteoarthritis of knee: Secondary | ICD-10-CM | POA: Diagnosis not present

## 2022-09-22 DIAGNOSIS — M545 Low back pain, unspecified: Secondary | ICD-10-CM | POA: Insufficient documentation

## 2022-09-22 DIAGNOSIS — M25551 Pain in right hip: Secondary | ICD-10-CM | POA: Diagnosis not present

## 2022-09-22 DIAGNOSIS — M7062 Trochanteric bursitis, left hip: Secondary | ICD-10-CM | POA: Diagnosis not present

## 2022-09-22 DIAGNOSIS — M7061 Trochanteric bursitis, right hip: Secondary | ICD-10-CM | POA: Diagnosis not present

## 2022-09-22 DIAGNOSIS — Z6841 Body Mass Index (BMI) 40.0 and over, adult: Secondary | ICD-10-CM | POA: Insufficient documentation

## 2022-09-22 DIAGNOSIS — Z79899 Other long term (current) drug therapy: Secondary | ICD-10-CM | POA: Insufficient documentation

## 2022-09-22 MED ORDER — HYDROCODONE-ACETAMINOPHEN 10-325 MG PO TABS: 1.0000 | ORAL_TABLET | Freq: Four times a day (QID) | ORAL | 0 refills | Status: DC | PRN

## 2022-09-22 NOTE — Progress Notes (Signed)
Subjective:    Patient ID: Kelsey Santana, female    DOB: [birth year], 62 y.o.   MRN: [id number]  HPI: Kelsey Santana is a 62 y.o. female who returns for follow up appointment for chronic pain and medication refill. She states her pain is located in her lower back, bilateral hips L> R and bilateral knee pain. She also reports she has a migraine onset a hour ago, when she gets home she will take her medication she states. She rates her pain 3. Her current exercise regime is walking and performing stretching exercises.  Kelsey Santana states she was walking up stairs and fell backward and landed on her back, she was able to pick herself up. She didn't seek medical attention. Education on fall prevention, she verbalizes understanding.   Kelsey Santana states her son passed away  last month, emotional support given.   Kelsey Santana Morphine equivalent is 40.00 MME.   Last UDS on 08/22/2022, it was consistent.    Pain Inventory Average Pain 10 Pain Right Now 3 My pain is sharp, stabbing, and aching  In the last 24 hours, has pain interfered with the following? General activity 5 Relation with others 5 Enjoyment of life 5 What TIME of day is your pain at its worst? morning , daytime, and evening Sleep (in general) Fair  Pain is worse with: walking, bending, inactivity, standing, and some activites Pain improves with: medication and injections Relief from Meds: 9  Family History  Problem Relation Age of Onset   Alcohol abuse Mother    Diabetes Mother    Hyperlipidemia Mother    Hypertension Mother    Diabetes Brother    Hyperlipidemia Brother    Hypertension Brother    Thyroid disease Neg Hx    Social History   Socioeconomic History   Marital status: Single    Spouse name: Not on file   Number of children: Not on file   Years of education: Not on file   Highest education level: Not on file  Occupational History   Not on file  Tobacco Use   Smoking status: Former   Smokeless  tobacco: Former   Tobacco comments:    quit 28 yrs ago  Vaping Use   Vaping Use: Never used  Substance and Sexual Activity   Alcohol use: No   Drug use: No   Sexual activity: Never  Other Topics Concern   Not on file  Social History Narrative   epworth sleepiness scale = 10 (11/15/15)    Lives alone in a 2 story home.  Has 3 children.  Currently not working.  Has lost 6 jobs in the past 2 months due to her leg numbness and pain.     Education: 2 years of college.   Social Determinants of Health   Financial Resource Strain: Not on file  Food Insecurity: Not on file  Transportation Needs: Not on file  Physical Activity: Not on file  Stress: Not on file  Social Connections: Not on file   Past Surgical History:  Procedure Laterality Date   ABDOMINAL HYSTERECTOMY     complete   COLONOSCOPY N/A 01/16/2017   Procedure: COLONOSCOPY;  Surgeon: Carol Ada, MD;  Location: [facility];  Service: Endoscopy;  Laterality: N/A;   ESOPHAGOGASTRODUODENOSCOPY N/A 01/16/2017   Procedure: ESOPHAGOGASTRODUODENOSCOPY (EGD);  Surgeon: Carol Ada, MD;  Location: [facility];  Service: Endoscopy;  Laterality: N/A;   Past Surgical History:  Procedure Laterality Date   ABDOMINAL HYSTERECTOMY  complete   COLONOSCOPY N/A 01/16/2017   Procedure: COLONOSCOPY;  Surgeon: Carol Ada, MD;  Location: [facility];  Service: Endoscopy;  Laterality: N/A;   ESOPHAGOGASTRODUODENOSCOPY N/A 01/16/2017   Procedure: ESOPHAGOGASTRODUODENOSCOPY (EGD);  Surgeon: Carol Ada, MD;  Location: [facility];  Service: Endoscopy;  Laterality: N/A;   Past Medical History:  Diagnosis Date   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  Opioid Risk Score:   Fall Risk Score:  `1  Depression screen PHQ 2/9     08/22/2022    2:08 PM 07/12/2022   10:16 AM 06/12/2022    11:19 AM 04/04/2022   11:02 AM 12/21/2021    1:17 PM 11/17/2021    9:54 AM 09/23/2021    8:20 AM  Depression screen PHQ 2/9  Decreased Interest 1 0 0 1 0 1 1  Down, Depressed, Hopeless 1 0 0 1 0 1 1  PHQ - 2 Score 2 0 0 2 0 2 2    Review of Systems  Musculoskeletal:  Positive for back pain and gait problem.  All other systems reviewed and are negative.     Objective:   Physical Exam Vitals reviewed.  Constitutional:      Appearance: Normal appearance. She is obese.  Cardiovascular:     Rate and Rhythm: Normal rate and regular rhythm.     Pulses: Normal pulses.     Heart sounds: Normal heart sounds.  Pulmonary:     Effort: Pulmonary effort is normal.     Breath sounds: Normal breath sounds.  Musculoskeletal:     Cervical back: Normal range of motion and neck supple.     Comments: Normal Muscle Bulk and Muscle Testing Reveals:  Upper Extremities: Full ROM and Muscle Strength 5/5  Lumbar Paraspinal Tenderness: L-4-L-5 Bilateral Greater Trochanter Tenderness Lower Extremities : Right: Full ROM and Muscle Strength 5/5 Left Lower Extremity: Decreased ROM and Muscle Strength 5/5 Left Lower Extremity Flexion Produces Pain into her Left Hip and Lumbar Arises from Chair slowly Narrow Based Gait     Skin:    General: Skin is warm and dry.  Neurological:     Mental Status: She is alert and oriented to person, place, and time.  Psychiatric:        Mood and Affect: Mood normal.        Behavior: Behavior normal.         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 Continue to monitor. 09/22/2022 Bilateral Greater Trochanter Tenderness: Continue to Alternate Ice and Heat Therapy. Continue to Monitor. 09/22/2022 Bilateral Primary Osteoarthritis of Bilateral Knees: . Continue  HEP as Tolerated . Continue current medication regimen as prescribed. Continue to Monitor. 09/22/2022 Chronic Pain  Syndrome: Refilled: Hydrocodone '10mg'$ /325 one tablet 4 times a day as needed for pain. #120.  We will continue the opioid monitoring program, this consists of regular clinic visits, examinations, urine drug screen, pill counts as well as use of [place] Controlled Substance Reporting system. A 12 month History has been reviewed on the [place] Controlled Substance Reporting System on 09/22/2022 5.Morbid Obesity: Continue Healthy Diet Regimen and Continue with HEP as Tolerated. Marland Kitchen 09/22/2022 6. Fall at Home, subsequent encounter: Educated on [employer]. She verbalizes understanding.  7. Bradycardia: Ms. Dehnert states she will keep a vital flow sheet and  will F/U with her PCP . She was instructed to call 911 if she becomes symptomatic, she verbalizes understanding. She also reports she had bradycardia in the past. We will continue to monitor.   F/U in 1 month

## 2022-09-25 ENCOUNTER — Emergency Department (HOSPITAL_BASED_OUTPATIENT_CLINIC_OR_DEPARTMENT_OTHER)
Admission: EM | Admit: 2022-09-25 | Discharge: 2022-09-25 | Disposition: A | Discharge status: Home / Self Care | Payer: No Typology Code available for payment source | Attending: Emergency Medicine | Admitting: Emergency Medicine

## 2022-09-25 ENCOUNTER — Emergency Department (HOSPITAL_BASED_OUTPATIENT_CLINIC_OR_DEPARTMENT_OTHER): Payer: No Typology Code available for payment source | Admitting: Radiology

## 2022-09-25 ENCOUNTER — Other Ambulatory Visit: Payer: Self-pay

## 2022-09-25 ENCOUNTER — Encounter (HOSPITAL_BASED_OUTPATIENT_CLINIC_OR_DEPARTMENT_OTHER): Payer: Self-pay | Admitting: Emergency Medicine

## 2022-09-25 DIAGNOSIS — Y9389 Activity, other specified: Secondary | ICD-10-CM | POA: Diagnosis not present

## 2022-09-25 DIAGNOSIS — Y9289 Other specified places as the place of occurrence of the external cause: Secondary | ICD-10-CM | POA: Insufficient documentation

## 2022-09-25 DIAGNOSIS — Y998 Other external cause status: Secondary | ICD-10-CM | POA: Insufficient documentation

## 2022-09-25 DIAGNOSIS — S6991XA Unspecified injury of right wrist, hand and finger(s), initial encounter: Secondary | ICD-10-CM | POA: Diagnosis present

## 2022-09-25 DIAGNOSIS — M25531 Pain in right wrist: Secondary | ICD-10-CM

## 2022-09-25 DIAGNOSIS — W231XXA Caught, crushed, jammed, or pinched between stationary objects, initial encounter: Secondary | ICD-10-CM | POA: Diagnosis not present

## 2022-09-25 NOTE — ED Notes (Signed)
Discharge instructions, pain management, and follow up care reviewed and explained, ice pack provided, and wrist splint was placed. Pt verbalized understanding and had no further questions on d/c. Pt caox4 and ambulatory on d/c.

## 2022-09-25 NOTE — Discharge Instructions (Signed)
Continue taking your Tylenol and hydrocodone at home.  Take 600 of ibuprofen every 6 hours with food and water for inflammation and pain.  Follow-up with your primary if still in pain in about a week.  Return to ED for new or concerning symptoms.

## 2022-09-25 NOTE — ED Provider Notes (Signed)
[facility] Provider Note   CSN: [id number] Arrival date & time: 09/25/22  7510     History  Chief Complaint  Patient presents with   Wrist Injury    Kelsey Santana is a 62 y.o. female.   Wrist Injury    This is a 62 year old female presenting to the emergency department due to right wrist and hand pain.  Happened 2 days ago after slamming a car door on her hand.  She is right-hand dominant.  She does have a tingling sensation along digits 3 4 and 5, she is able to make a fist but has elicited pain when she does so.  Significant decreased ROM to wrist secondary to pain.  Not on any blood thinners, right-hand-dominant.  Home Medications Prior to Admission medications   Medication Sig Start Date End Date Taking? Authorizing Provider  amLODipine (NORVASC) 5 MG tablet     [provider]  Cholecalciferol 125 MCG (5000 UT) TABS Take 1 tablet every day by oral route.    [provider]  Cholecalciferol 50 MCG (2000 UT) TABS Take 1 tablet by mouth daily. 03/25/20   [provider]  cyclobenzaprine (FLEXERIL) 10 MG tablet Take 1 tablet (10 mg total) by mouth 2 (two) times daily as needed for muscle spasms. 06/06/22   Raulkar, Clide Deutscher, MD  hydrochlorothiazide (MICROZIDE) 12.5 MG capsule TAKE ONE CAPSULE BY MOUTH DAILY AT 9AM SCHEDULE OFFICE VISIT FOR FUTURE REFILLS 08/04/22   Raulkar, Clide Deutscher, MD  HYDROcodone-acetaminophen (NORCO) 10-325 MG tablet Take 1 tablet by mouth every 6 (six) hours as needed for severe pain. 09/22/22   Bayard Hugger, NP  ibuprofen (ADVIL) 200 MG tablet Take 2 tablets (400 mg total) by mouth every 8 (eight) hours as needed for headache. 02/17/19   Dhungel, Nishant, MD  lidocaine (XYLOCAINE) 5 % ointment Apply 1 application topically See admin instructions. APPLY TO THIGH DAILY AS NEEDED FOR PAIN 02/05/21   Raulkar, Clide Deutscher, MD  metFORMIN (GLUCOPHAGE) 500 MG tablet Take 1 tablet (500 mg total)  by mouth daily with breakfast. 06/06/22   Raulkar, Clide Deutscher, MD  Multiple Vitamins-Minerals (MULTIVITAMIN) tablet Take 1 tablet by mouth daily. 09/19/19   Raulkar, Clide Deutscher, MD  nitroGLYCERIN (NITROSTAT) 0.4 MG SL tablet Place 1 tablet (0.4 mg total) under the tongue every 5 (five) minutes as needed for chest pain. 07/26/21   Raulkar, Clide Deutscher, MD  nitroGLYCERIN (NITROSTAT) 0.4 MG SL tablet Place 1 tablet (0.4 mg total) under the tongue every 5 (five) minutes as needed for chest pain. 07/04/22   Raulkar, Clide Deutscher, MD  OZEMPIC, 1 MG/DOSE, 4 MG/3ML SOPN Inject 1 mg into the skin once a week. 06/06/22   Raulkar, Clide Deutscher, MD  pantoprazole (PROTONIX) 20 MG tablet pantoprazole 20 mg tablet,delayed release  Take 1 tablet every day by oral route.    [provider]  Semaglutide, 2 MG/DOSE, (OZEMPIC, 2 MG/DOSE,) 8 MG/3ML SOPN Inject 1 each into the skin once a week. 01/17/22   Raulkar, Clide Deutscher, MD  triamcinolone cream (KENALOG) 0.1 % Apply 1 Application topically 2 (two) times daily. To affected area till better 08/08/22   Barrett Henle, MD  Vitamin D, Ergocalciferol, (DRISDOL) 1.25 MG (50000 UNIT) CAPS capsule Take 1 capsule (50,000 Units total) by mouth every 7 (seven) days. 11/17/21   Raulkar, Clide Deutscher, MD  Vitamin D, Ergocalciferol, (DRISDOL) 1.25 MG (50000 UNIT) CAPS capsule TAKE 1 CAPSULE BY MOUTH EVERY MONDAY  AT 9AM (EVERY 7 DAYS) 08/04/22   Raulkar, Clide Deutscher, MD      Allergies    Ciprofloxacin, Hydrocortisone, and Ace inhibitors    Review of Systems   Review of Systems  Physical Exam Updated Vital Signs BP (!) 167/81 (BP Location: Left Arm)   Pulse 69   Temp 97.8 F (36.6 C) (Oral)   Resp 16   SpO2 97%  Physical Exam Vitals and nursing note reviewed. Exam conducted with a chaperone present.  Constitutional:      General: She is not in acute distress.    Appearance: Normal appearance.  HENT:     Head: Normocephalic and atraumatic.  Eyes:     General: No scleral  icterus.    Extraocular Movements: Extraocular movements intact.     Pupils: Pupils are equal, round, and reactive to light.  Cardiovascular:     Pulses: Normal pulses.  Musculoskeletal:        General: Swelling and tenderness present.     Comments: Swelling over dorsum right wrist.  Reproducible tenderness over the dorsum of the right hand without any crepitus.  Decreased active ROM, tolerates passive ROM of right wrist, able to make a fist and extend and flex each digit at the DIP and PIP, intact abduction abduction of thumb.  Skin:    Capillary Refill: Capillary refill takes less than 2 seconds.     Coloration: Skin is not jaundiced.  Neurological:     Mental Status: She is alert. Mental status is at baseline.     Coordination: Coordination normal.     Comments: Subjective tingling in right median nerve distribution sensation is in tact     ED Results / Procedures / Treatments   Labs (all labs ordered are listed, but only abnormal results are displayed) Labs Reviewed - No data to display  EKG None  Radiology No results found.  Procedures Procedures    Medications Ordered in ED Medications - No data to display  ED Course/ Medical Decision Making/ A&P                             Medical Decision Making Amount and/or Complexity of Data Reviewed Radiology: ordered.   This is a 62 year old female presenting today due to right wrist and hand pain.  On exam she is neurovascular intact with brisk cap refill and radial pulses 2+.  She does have a sensation deficit to the right median nerve distribution which is subjective and feels like pins-and-needles.  She has reproducible paresthesias with Tennille sign.  Could be irritation of the carpal tunnel, given mechanism also proceed with radiographic imaging to evaluate for any fractures.  There is no obvious dislocation on exam.  I viewed and interpreted plain film, agree with radiologist with no acute fractures.  Will provide  wrist brace for support.  Encouraged anti-inflammatory use at home as well as Tylenol.  Stable for discharge at this time.        Final Clinical Impression(s) / ED Diagnoses Final diagnoses:  None    Rx / DC Orders ED Discharge Orders     None         Sherrill Raring, [place] 09/25/22 1114    Tegeler, Gwenyth Allegra, MD 09/25/22 [phone]

## 2022-09-25 NOTE — ED Triage Notes (Signed)
Pt arrives to ED with c/o wrist injury. She reports that her right wrist was shut into a car door x2 days ago.

## 2022-09-30 ENCOUNTER — Other Ambulatory Visit: Payer: Self-pay | Admitting: Physical Medicine and Rehabilitation

## 2022-10-10 ENCOUNTER — Telehealth: Payer: Self-pay | Admitting: *Deleted

## 2022-10-10 NOTE — Telephone Encounter (Signed)
Ovid Curd with [facility] requesting refill  R[facility]T [facility] to verify medication requessting.

## 2022-10-20 NOTE — Telephone Encounter (Signed)
Message closed due to lack of feedback from [facility].

## 2022-10-26 ENCOUNTER — Encounter: Payer: Self-pay | Admitting: Registered Nurse

## 2022-10-26 ENCOUNTER — Encounter
Payer: No Typology Code available for payment source | Attending: Physical Medicine and Rehabilitation | Admitting: Registered Nurse

## 2022-10-26 VITALS — BP 147/88 | HR 59 | Ht 64.0 in | Wt 247.0 lb

## 2022-10-26 DIAGNOSIS — M170 Bilateral primary osteoarthritis of knee: Secondary | ICD-10-CM

## 2022-10-26 DIAGNOSIS — G894 Chronic pain syndrome: Secondary | ICD-10-CM | POA: Diagnosis not present

## 2022-10-26 DIAGNOSIS — Z5181 Encounter for therapeutic drug level monitoring: Secondary | ICD-10-CM | POA: Diagnosis present

## 2022-10-26 DIAGNOSIS — G8929 Other chronic pain: Secondary | ICD-10-CM | POA: Insufficient documentation

## 2022-10-26 DIAGNOSIS — M545 Low back pain, unspecified: Secondary | ICD-10-CM | POA: Diagnosis not present

## 2022-10-26 DIAGNOSIS — Z79891 Long term (current) use of opiate analgesic: Secondary | ICD-10-CM

## 2022-10-26 MED ORDER — HYDROCODONE-ACETAMINOPHEN 10-325 MG PO TABS: 1.0000 | ORAL_TABLET | Freq: Four times a day (QID) | ORAL | 0 refills | Status: DC | PRN

## 2022-10-26 NOTE — Progress Notes (Signed)
Subjective:    Patient ID: Kelsey Santana, female    DOB: August 05, 1961, 62 y.o.   MRN: [id number]  HPI: Kelsey Santana is a 62 y.o. female who returns for follow up appointment for chronic pain and medication refill. She states her pain is located in her lower back. She rates her pain 2. Her current exercise regime is walking and performing stretching exercises.  Kelsey Santana Morphine equivalent is 40.00 MME.   Last UDS was Performed on 08/22/2022, it was consistent.      Pain Inventory Average Pain 10 Pain Right Now 2 My pain is sharp, burning, stabbing, and aching  In the last 24 hours, has pain interfered with the following? General activity 3 Relation with others 3 Enjoyment of life 4 What TIME of day is your pain at its worst? morning , daytime, evening, and night Sleep (in general) Poor  Pain is worse with: walking, bending, sitting, inactivity, and some activites Pain improves with: medication and injections Relief from Meds: 10  Family History  Problem Relation Age of Onset   Alcohol abuse Mother    Diabetes Mother    Hyperlipidemia Mother    Hypertension Mother    Diabetes Brother    Hyperlipidemia Brother    Hypertension Brother    Thyroid disease Neg Hx    Social History   Socioeconomic History   Marital status: Single    Spouse name: Not on file   Number of children: Not on file   Years of education: Not on file   Highest education level: Not on file  Occupational History   Not on file  Tobacco Use   Smoking status: Former   Smokeless tobacco: Former   Tobacco comments:    quit 28 yrs ago  Vaping Use   Vaping Use: Never used  Substance and Sexual Activity   Alcohol use: No   Drug use: No   Sexual activity: Never  Other Topics Concern   Not on file  Social History Narrative   epworth sleepiness scale = 10 (11/15/15)    Lives alone in a 2 story home.  Has 3 children.  Currently not working.  Has lost 6 jobs in the past 2 months due to her leg  numbness and pain.     Education: 2 years of college.   Social Determinants of Health   Financial Resource Strain: Not on file  Food Insecurity: Not on file  Transportation Needs: Not on file  Physical Activity: Not on file  Stress: Not on file  Social Connections: Not on file   Past Surgical History:  Procedure Laterality Date   ABDOMINAL HYSTERECTOMY     complete   COLONOSCOPY N/A 01/16/2017   Procedure: COLONOSCOPY;  Surgeon: Carol Ada, MD;  Location: [facility];  Service: Endoscopy;  Laterality: N/A;   ESOPHAGOGASTRODUODENOSCOPY N/A 01/16/2017   Procedure: ESOPHAGOGASTRODUODENOSCOPY (EGD);  Surgeon: Carol Ada, MD;  Location: [facility];  Service: Endoscopy;  Laterality: N/A;   Past Surgical History:  Procedure Laterality Date   ABDOMINAL HYSTERECTOMY     complete   COLONOSCOPY N/A 01/16/2017   Procedure: COLONOSCOPY;  Surgeon: Carol Ada, MD;  Location: [facility];  Service: Endoscopy;  Laterality: N/A;   ESOPHAGOGASTRODUODENOSCOPY N/A 01/16/2017   Procedure: ESOPHAGOGASTRODUODENOSCOPY (EGD);  Surgeon: Carol Ada, MD;  Location: [facility];  Service: Endoscopy;  Laterality: N/A;   Past Medical History:  Diagnosis Date   Back pain    l 4 and l5 djd   GERD (gastroesophageal  reflux disease)    Hypercholesteremia    Hypertension    Migraine    Pre-diabetes    BP (!) 147/88   Pulse (!) 59   Ht '5\' 4"'$  (1.626 m)   Wt 247 lb (112 kg)   SpO2 96%   BMI 42.40 kg/m   Opioid Risk Score:   Fall Risk Score:  `1  Depression screen [facility] 2/9     10/26/2022    9:50 AM 09/22/2022    9:34 AM 08/22/2022    2:08 PM 07/12/2022   10:16 AM 06/12/2022   11:19 AM 04/04/2022   11:02 AM 12/21/2021    1:17 PM  Depression screen PHQ 2/9  Decreased Interest 1 0 1 0 0 1 0  Down, Depressed, Hopeless 1 0 1 0 0 1 0  PHQ - 2 Score 2 0 2 0 0 2 0    Review of Systems  Constitutional: Negative.   HENT: Negative.    Eyes: Negative.   Respiratory: Negative.    Cardiovascular:  Negative.   Gastrointestinal: Negative.   Endocrine: Negative.   Genitourinary: Negative.   Musculoskeletal:  Positive for arthralgias, back pain and gait problem.  Skin: Negative.   Allergic/Immunologic: Negative.   Hematological: Negative.   Psychiatric/Behavioral:  Positive for dysphoric mood.        Lost son in January  All other systems reviewed and are negative.      Objective:   Physical Exam        Assessment & Plan:  Chronic Low Back Pain without Sciatica/ Right Lumbar Radiculitis: Continue Gabapentin .Continue HEP as Tolerated. Continue to monitor. 10/26/2022 Bilateral Greater Trochanter Tenderness: Continue to Alternate Ice and Heat Therapy. Continue to Monitor. 10/26/2022 Bilateral Primary Osteoarthritis of Bilateral Knees: Schedule for Synvisc Injection with Dr Ranell Patrick in April 20/24 .Continue  HEP as Tolerated . Continue current medication regimen as prescribed. Continue to Monitor. 10/26/2022 Chronic Pain Syndrome: Refilled: Hydrocodone '10mg'$ /325 one tablet 4 times a day as needed for pain. #120.  We will continue the opioid monitoring program, this consists of regular clinic visits, examinations, urine drug screen, pill counts as well as use of [place] Controlled Substance Reporting system. A 12 month History has been reviewed on the [place] Controlled Substance Reporting System on 10/26/2022 5.Morbid Obesity: Continue Healthy Diet Regimen and Continue with HEP as Tolerated. Marland Kitchen 10/26/2022 6. Essential Hypertension: Blood Pressure was rechecked: She states she is compliant with her medication. PCP following. Continue to monitor.   F/U in 1 month

## 2022-11-03 ENCOUNTER — Other Ambulatory Visit: Payer: Self-pay | Admitting: Physical Medicine and Rehabilitation

## 2022-11-06 ENCOUNTER — Other Ambulatory Visit: Payer: Self-pay | Admitting: Physical Medicine and Rehabilitation

## 2022-11-06 MED ORDER — OZEMPIC (2 MG/DOSE) 8 MG/3ML ~~LOC~~ SOPN: 1.0000 mg | PEN_INJECTOR | SUBCUTANEOUS | 3 refills | Status: DC

## 2022-11-08 ENCOUNTER — Telehealth: Payer: Self-pay

## 2022-11-08 NOTE — Telephone Encounter (Signed)
Fax sent from pharmacy. Patient says they take 1 mg and 2mg  Ozempic. Need clarification

## 2022-11-28 ENCOUNTER — Telehealth: Payer: Self-pay

## 2022-11-28 MED ORDER — DIHYDROERGOTAMINE MESYLATE 4 MG/ML NA SOLN: 1.0000 | NASAL | 11 refills | Status: DC | PRN

## 2022-11-28 NOTE — Telephone Encounter (Signed)
sent 

## 2022-11-28 NOTE — Telephone Encounter (Signed)
Refill request for Dihydroergotamine 4 mg/ml spray intill one spray in nostril as directed as needed for migraine(no more than 4 sprays in nostril) #8 last written 08/21/21

## 2022-12-04 ENCOUNTER — Encounter
Payer: No Typology Code available for payment source | Attending: Physical Medicine and Rehabilitation | Admitting: Physical Medicine and Rehabilitation

## 2022-12-04 VITALS — BP 129/89 | HR 69 | Ht 64.0 in | Wt 246.0 lb

## 2022-12-04 DIAGNOSIS — G43809 Other migraine, not intractable, without status migrainosus: Secondary | ICD-10-CM | POA: Diagnosis present

## 2022-12-04 DIAGNOSIS — M170 Bilateral primary osteoarthritis of knee: Secondary | ICD-10-CM | POA: Diagnosis not present

## 2022-12-04 DIAGNOSIS — E559 Vitamin D deficiency, unspecified: Secondary | ICD-10-CM | POA: Diagnosis present

## 2022-12-04 DIAGNOSIS — M1712 Unilateral primary osteoarthritis, left knee: Secondary | ICD-10-CM | POA: Diagnosis not present

## 2022-12-04 DIAGNOSIS — M1711 Unilateral primary osteoarthritis, right knee: Secondary | ICD-10-CM | POA: Diagnosis not present

## 2022-12-04 MED ORDER — HYLAN G-F 20 16 MG/2ML IX SOSY: 6.0000 mg | PREFILLED_SYRINGE | Freq: Once | INTRA_ARTICULAR | Status: DC

## 2022-12-04 MED ORDER — HYLAN G-F 20 16 MG/2ML IX SOSY: 16.0000 mg | PREFILLED_SYRINGE | Freq: Once | INTRA_ARTICULAR | Status: DC

## 2022-12-04 MED ORDER — HYLAN G-F 20 16 MG/2ML IX SOSY: 48.0000 mg | PREFILLED_SYRINGE | Freq: Once | INTRA_ARTICULAR | Status: DC

## 2022-12-04 MED ORDER — HYDROCODONE-ACETAMINOPHEN 10-325 MG PO TABS: 1.0000 | ORAL_TABLET | Freq: Three times a day (TID) | ORAL | 0 refills | Status: DC | PRN

## 2022-12-04 NOTE — Progress Notes (Unsigned)
Subjective:    Patient ID: Kelsey Santana, female    DOB: 1960-12-31, 62 y.o.   MRN: [id number]  HPI:   1) Bilateral knee pain 2) obesity Kelsey Santana is a 62 y.o. female who returns for f/u appointment for chronic pain secondary to bilateral knee osteoarthritis, obesity, and migraines. She states her pain is located in her lower back radiating into her right lower extremity, bilateral hip pain R>L and bilateral knee pain R>L. She rates her pain 7. Her current exercise regime is walking and performing stretching exercises. -she lost a lot of weight -she stopped eating sugars.  -right knee is feeling better but left is hurting more.  -right knee aches more at night -can't climb up stairs -she has to order groceries from [employer] -she has lost 70 lbs with the [facility]  Ms. Lagan Morphine equivalent is 33.33 MME.  She is upset about about her weight gain and is consider bypass surgery, has lost weight with semalglutide but weight loss has plateued -she is mostly eating fruits now, not eating much protein or vegetables -CBGs dropped while taking semalgutide and metformin so she stopped metformin -CBGs now stable -she is only eating one meal per day -has diffuse bone pain Knee pain has been severe She has been sleeping poorly at night She [facility] [employer] She is having severe migraines that have been responsive to Migranol in the past. She has failed antidepressants, Depakote, opioids, topamax, Wellbutrin. She has noticied that opioids could trigger migraines when she is further from the dose. She has been taught elimination diet.  -has been very severe -she had trouble walking out of [employer] recently -she is not sure if the Zilretta was as effective as the steroid injections but would still like to try next visit  -she is considering gastric sleeve -she has to make herself eat -she has stopped sodas.  -she is trying to eat more protein -she bought Ensure  Max -drinks water all day -lost 14 lbs since starting Ozempic! -only can take a couple of bites until she is full -she is trying to avoid bread, meat.  -she would like to try a medication -she does not want to try phenteramine when I shared that I have a patient who tried it and had a stroke -she would like to try Ozempic as she heard a friend lost a lot of weight on it and it curbed his appetite -she has been trying to eat healthier     Pain Inventory Average Pain 10 Pain Right Now 9 My pain is constant, sharp, and aching  In the last 24 hours, has pain interfered with the following? General activity 3 Relation with others 4 Enjoyment of life 1 What TIME of day is your pain at its worst? morning , daytime, evening, and night Sleep (in general) Poor  Pain is worse with: walking Pain improves with: medication and injections Relief from Meds: 9  Family History  Problem Relation Age of Onset   Alcohol abuse Mother    Diabetes Mother    Hyperlipidemia Mother    Hypertension Mother    Diabetes Brother    Hyperlipidemia Brother    Hypertension Brother    Thyroid disease Neg Hx    Social History   Socioeconomic History   Marital status: Single    Spouse name: Not on file   Number of children: Not on file   Years of education: Not on file   Highest education  level: Not on file  Occupational History   Not on file  Tobacco Use   Smoking status: Former   Smokeless tobacco: Former   Tobacco comments:    quit 28 yrs ago  Vaping Use   Vaping Use: Never used  Substance and Sexual Activity   Alcohol use: No   Drug use: No   Sexual activity: Never  Other Topics Concern   Not on file  Social History Narrative   epworth sleepiness scale = 10 (11/15/15)    Lives alone in a 2 story home.  Has 3 children.  Currently not working.  Has lost 6 jobs in the past 2 months due to her leg numbness and pain.     Education: 2 years of college.   Social Determinants of Health    Financial Resource Strain: Not on file  Food Insecurity: Not on file  Transportation Needs: Not on file  Physical Activity: Not on file  Stress: Not on file  Social Connections: Not on file   Past Surgical History:  Procedure Laterality Date   ABDOMINAL HYSTERECTOMY     complete   COLONOSCOPY N/A 01/16/2017   Procedure: COLONOSCOPY;  Surgeon: Jeani Hawking, MD;  Location: [facility];  Service: Endoscopy;  Laterality: N/A;   ESOPHAGOGASTRODUODENOSCOPY N/A 01/16/2017   Procedure: ESOPHAGOGASTRODUODENOSCOPY (EGD);  Surgeon: Jeani Hawking, MD;  Location: [facility];  Service: Endoscopy;  Laterality: N/A;   Past Surgical History:  Procedure Laterality Date   ABDOMINAL HYSTERECTOMY     complete   COLONOSCOPY N/A 01/16/2017   Procedure: COLONOSCOPY;  Surgeon: Jeani Hawking, MD;  Location: [facility];  Service: Endoscopy;  Laterality: N/A;   ESOPHAGOGASTRODUODENOSCOPY N/A 01/16/2017   Procedure: ESOPHAGOGASTRODUODENOSCOPY (EGD);  Surgeon: Jeani Hawking, MD;  Location: [facility];  Service: Endoscopy;  Laterality: N/A;   Past Medical History:  Diagnosis Date   Back pain    l 4 and l5 djd   GERD (gastroesophageal reflux disease)    Hypercholesteremia    Hypertension    Migraine    Pre-diabetes    BP 129/89   Pulse 69   Ht 5\' 4"  (1.626 m)   Wt 246 lb (111.6 kg)   SpO2 97%   BMI 42.23 kg/m   Opioid Risk Score:   Fall Risk Score:  `1  Depression screen [facility] 2/9     10/26/2022    9:50 AM 09/22/2022    9:34 AM 08/22/2022    2:08 PM 07/12/2022   10:16 AM 06/12/2022   11:19 AM 04/04/2022   11:02 AM 12/21/2021    1:17 PM  Depression screen PHQ 2/9  Decreased Interest 1 0 1 0 0 1 0  Down, Depressed, Hopeless 1 0 1 0 0 1 0  PHQ - 2 Score 2 0 2 0 0 2 0    Review of Systems  Musculoskeletal:  Positive for back pain, gait problem and joint swelling.       Bilateral knee pain  All other systems reviewed and are negative.      Objective:  Gen: no distress, normal appearing,  weight is 251 lbs, BMI 43.08, BP 150/86 HEENT: oral mucosa pink and moist, NCAT Cardio: Reg rate Chest: normal effort, normal rate of breathing Abd: soft, non-distended Ext: no edema Psych: pleasant, normal affect Skin: intact Neuro: TTP bilateral knees    Assessment & Plan:  Right Lumbar Radiculitis: Continue Gabapentin . Continue HEP as Tolerated. Continue to monitor.  Bilateral Greater Trochanter Tenderness: Continue to Alternate Ice and Heat  Therapy. Continue to Monitor Bilateral Primary Osteoarthritis of Bilateral Knees: Continue  HEP as Tolerated . Continue to Monitor. Discussed that continued weight loss with also help her knee pain. Performed steroid injection as below. Reschedule Zilretta to next available.   Knee injection, bilateral  Indication: Bilateral knee pain not relieved by medication management and other conservative care.  Informed consent was obtained after describing risks and benefits of the procedure with the patient, this includes bleeding, bruising, infection and medication side effects. The patient wishes to proceed and has given written consent. The patient was placed in a recumbent position. The medial aspect of the knee was marked and prepped with Betadine and alcohol. It was then entered with a 25-gauge 1-1/2 inch needle and 1 mL of 1% lidocaine was injected into the skin and subcutaneous tissue. Then another needle was inserted into the knee joint. After negative draw back for blood, a solution containing Synvisc was administered. The patient tolerated the procedure well. Post procedure instructions were given. It was repeated bilaterally  Chronic Pain Syndrome: Decrease Hydrocodone to /325 one tablet 3 times a day as needed for pain. #120.  We will continue the opioid monitoring program, this consists of regular clinic visits, examinations, urine drug screen, pill counts as well as use of [place] Controlled Substance Reporting system.   5. Obesity:   -weight is 246 lbs -continue wegovy Discussed following criteria for Wegovy: 1) Patient has diagnosis of obesity; 2) Patient must be 52 years of age or older; 3) The patient has been involved in a physician or dietitian monitored weight loss program, consisting of both low-calorie diet, increased physical activity and behavioral counseling for a minimum of 6 months without a 3% loss from baseline; 4) Patients BMI is one of the following: a) 30kg/m or greater; b) 27 29.99 kg/m in the presence of at least one weight related comorbidity such as coronary heart disease, dyslipidemia, hypertension, symptomatic arthritis of the lower extremities, type 2 diabetes mellitus, obstructive sleep apnea; c) 25-29.9 kg/m2 and waist circumference is &gt; 40 inches for males or &gt; 35 inches for females;5) Not Met - Must have tried and failed at least one preferred oral agent such as Phentermine; 6) Patient has no labeled contraindication; 7) Patient is not taking another weight loss medication; 8) The dose is within approved product labeling guidelines; 10) The patient must not be taking another GLP-1 receptor agonist AND the patient must not be taking insulin concurrently.  -encouraged stopping eating added sugars -continue nuts -discussed eating protein daily -resistance training daily.  -recommended protein sources -continue avoiding bread.  -recommended eating eggs rather than Ensure Max.  -continue shrimp as source of protein. Mercury in salmon may be contributing to her joint symptoms. Advised to stop sodas and made plan to do so. She did not tolerate topamax well. Discussed gastric bypass surgery and gastric sleeve -prescribed ozempic, discussed its mechanism of action, side effects, increase dose  -Discussed the benefits of intermittent fasting. Recommended starting with pushing dinner 15 minutes earlier and when this feels easy, continuing to push dinner 15 minutes earlier. Discussed that this can help her  body to improve its ability to burn fat rather than glucose, improving insulin sensitivity. Recommended drinking Ro[place]ois tea in the evening to help curb appetite and for its numerous health benefits.   -discussed we can consider increasing metformin if she does not tolerate ozempic  6. Insomnia: -Try to go outside near sunrise -Get exercise during the day.  -Turn off all  devices an hour before bedtime.  -Teas that can benefit: chamomile, valerian root, Brahmi (Bacopa) -Can consider over the counter melatonin, magnesium, and/or L-theanine. Melatonin is an anti-oxidant with multiple health benefits. Magnesium is involved in greater than 300 enzymatic reactions in the body and most of [place] are deficient as our soil is often depleted. There are 7 different types of magnesium- Bioptemizer's is a supplement with all 7 types, and each has unique benefits. Magnesium can also help with constipation and anxiety.  -Pistachios naturally increase the production of melatonin -Cozy Earth bamboo bed sheets are free from toxic chemicals.  -Tart cherry juice or a tart cherry supplement can improve sleep and soreness post-workout   7. Migraines:  -continue migranol for her headaches- this has worked for her in the past and she has failed multiple other medications (listed above)  8) Vitamin D deficiency: Continue ergocalciferol 50,000U once per week for 14 weeks.

## 2022-12-20 ENCOUNTER — Other Ambulatory Visit: Payer: Self-pay | Admitting: Physical Medicine and Rehabilitation

## 2022-12-21 ENCOUNTER — Encounter: Payer: Self-pay | Admitting: Registered Nurse

## 2022-12-21 ENCOUNTER — Encounter: Payer: 59 | Attending: Physical Medicine and Rehabilitation | Admitting: Registered Nurse

## 2022-12-21 VITALS — BP 161/97 | HR 60 | Ht 64.0 in | Wt 251.8 lb

## 2022-12-21 DIAGNOSIS — G8929 Other chronic pain: Secondary | ICD-10-CM | POA: Insufficient documentation

## 2022-12-21 DIAGNOSIS — Z79891 Long term (current) use of opiate analgesic: Secondary | ICD-10-CM | POA: Diagnosis present

## 2022-12-21 DIAGNOSIS — Z5181 Encounter for therapeutic drug level monitoring: Secondary | ICD-10-CM | POA: Diagnosis present

## 2022-12-21 DIAGNOSIS — M545 Low back pain, unspecified: Secondary | ICD-10-CM | POA: Diagnosis not present

## 2022-12-21 DIAGNOSIS — M170 Bilateral primary osteoarthritis of knee: Secondary | ICD-10-CM | POA: Diagnosis not present

## 2022-12-21 DIAGNOSIS — G894 Chronic pain syndrome: Secondary | ICD-10-CM | POA: Diagnosis not present

## 2022-12-21 DIAGNOSIS — I10 Essential (primary) hypertension: Secondary | ICD-10-CM | POA: Insufficient documentation

## 2022-12-21 MED ORDER — HYDROCODONE-ACETAMINOPHEN 10-325 MG PO TABS: 1.0000 | ORAL_TABLET | Freq: Three times a day (TID) | ORAL | 0 refills | Status: DC | PRN

## 2022-12-21 NOTE — Progress Notes (Signed)
Subjective:    Patient ID: Kelsey Santana, female    DOB: 11/16/1960, 62 y.o.   MRN: [id number]  HPI: Kelsey Santana is a 62 y.o. female who returns for follow up appointment for chronic pain and medication refill. She states her pain is located in her lower back and bilateral knee pain. She rates her pain 2. Her current exercise regime is walking and performing stretching exercises.  Ms. Punch  Morphine equivalent is 30.00 MME.   Last UDS was Performed on 08/22/2022, it was consistent.     Pain Inventory Average Pain 10 Pain Right Now 2 My pain is sharp, burning, stabbing, tingling, and aching  In the last 24 hours, has pain interfered with the following? General activity 3 Relation with others 3 Enjoyment of life 3 What TIME of day is your pain at its worst? morning , daytime, evening, and night Sleep (in general) Poor  Pain is worse with: walking, bending, sitting, inactivity, standing, and some activites Pain improves with: medication and injections Relief from Meds: 10  Family History  Problem Relation Age of Onset   Alcohol abuse Mother    Diabetes Mother    Hyperlipidemia Mother    Hypertension Mother    Diabetes Brother    Hyperlipidemia Brother    Hypertension Brother    Thyroid disease Neg Hx    Social History   Socioeconomic History   Marital status: Single    Spouse name: Not on file   Number of children: Not on file   Years of education: Not on file   Highest education level: Not on file  Occupational History   Not on file  Tobacco Use   Smoking status: Former   Smokeless tobacco: Former   Tobacco comments:    quit 28 yrs ago  Vaping Use   Vaping Use: Never used  Substance and Sexual Activity   Alcohol use: No   Drug use: No   Sexual activity: Never  Other Topics Concern   Not on file  Social History Narrative   epworth sleepiness scale = 10 (11/15/15)    Lives alone in a 2 story home.  Has 3 children.  Currently not working.  Has lost  6 jobs in the past 2 months due to her leg numbness and pain.     Education: 2 years of college.   Social Determinants of Health   Financial Resource Strain: Not on file  Food Insecurity: Not on file  Transportation Needs: Not on file  Physical Activity: Not on file  Stress: Not on file  Social Connections: Not on file   Past Surgical History:  Procedure Laterality Date   ABDOMINAL HYSTERECTOMY     complete   COLONOSCOPY N/A 01/16/2017   Procedure: COLONOSCOPY;  Surgeon: Jeani Hawking, MD;  Location: [facility];  Service: Endoscopy;  Laterality: N/A;   ESOPHAGOGASTRODUODENOSCOPY N/A 01/16/2017   Procedure: ESOPHAGOGASTRODUODENOSCOPY (EGD);  Surgeon: Jeani Hawking, MD;  Location: [facility];  Service: Endoscopy;  Laterality: N/A;   Past Surgical History:  Procedure Laterality Date   ABDOMINAL HYSTERECTOMY     complete   COLONOSCOPY N/A 01/16/2017   Procedure: COLONOSCOPY;  Surgeon: Jeani Hawking, MD;  Location: [facility];  Service: Endoscopy;  Laterality: N/A;   ESOPHAGOGASTRODUODENOSCOPY N/A 01/16/2017   Procedure: ESOPHAGOGASTRODUODENOSCOPY (EGD);  Surgeon: Jeani Hawking, MD;  Location: [facility];  Service: Endoscopy;  Laterality: N/A;   Past Medical History:  Diagnosis Date   Back pain    l 4 and  l5 djd   GERD (gastroesophageal reflux disease)    Hypercholesteremia    Hypertension    Migraine    Pre-diabetes    BP (!) 169/104   Pulse (!) 59   Ht 5\' 4"  (1.626 m)   Wt 251 lb 12.8 oz (114.2 kg)   SpO2 95%   BMI 43.22 kg/m   Opioid Risk Score:   Fall Risk Score:  `1  Depression screen [facility] 2/9     12/21/2022    8:28 AM 10/26/2022    9:50 AM 09/22/2022    9:34 AM 08/22/2022    2:08 PM 07/12/2022   10:16 AM 06/12/2022   11:19 AM 04/04/2022   11:02 AM  Depression screen PHQ 2/9  Decreased Interest 0 1 0 1 0 0 1  Down, Depressed, Hopeless 0 1 0 1 0 0 1  PHQ - 2 Score 0 2 0 2 0 0 2     Review of Systems  Musculoskeletal:  Positive for back pain.        Bilateral knee pain   All other systems reviewed and are negative.     Objective:   Physical Exam Vitals and nursing note reviewed.  Constitutional:      Appearance: Normal appearance.  Cardiovascular:     Rate and Rhythm: Normal rate and regular rhythm.     Pulses: Normal pulses.     Heart sounds: Normal heart sounds.  Pulmonary:     Effort: Pulmonary effort is normal.     Breath sounds: Normal breath sounds.  Musculoskeletal:     Cervical back: Normal range of motion and neck supple.     Comments: Normal Muscle Bulk and Muscle Testing Reveals:  Upper Extremities: Full ROM and Muscle Strength 5/5   Lower Extremities: Full ROM and Muscle Strength 5/5 Arise from Chair with Ease  Narrow Based  Gait     Skin:    General: Skin is warm and dry.  Neurological:     Mental Status: She is alert and oriented to person, place, and time.  Psychiatric:        Mood and Affect: Mood normal.        Behavior: Behavior normal.         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 Continue to monitor. 12/21/2022 Bilateral Greater Trochanter Tenderness: Continue to Alternate Ice and Heat Therapy. Continue to Monitor. 12/21/2022 Bilateral Primary Osteoarthritis of Bilateral Knees: Schedule for Synvisc Injection with Dr Carlis Abbott in April 20/24 .Continue  HEP as Tolerated . Continue current medication regimen as prescribed. Continue to Monitor. 12/21/2022 Chronic Pain Syndrome: Refilled: Hydrocodone 10mg /325 one tablet 4 times a day as needed for pain. #120.  We will continue the opioid monitoring program, this consists of regular clinic visits, examinations, urine drug screen, pill counts as well as use of [place] Controlled Substance Reporting system. A 12 month History has been reviewed on the [place] Controlled Substance Reporting System on 12/21/2022 5.Morbid Obesity: Continue Healthy Diet  Regimen and Continue with HEP as Tolerated. Marland Kitchen 12/21/2022 6. Uncontrolled  Hypertension: Blood Pressure was rechecked: She states she didn't take her anti-hypertensive  medication for two days. Educated on medication compliance she verbalizes understanding. Ms. Riviere refuses Ed or Urgent Care evaluation, she states she will take her medication when she gets home. She will keep a blood pressure log and will F/U with her PCP . Continue to monitor.  F/U in 1 month

## 2022-12-22 ENCOUNTER — Encounter (HOSPITAL_BASED_OUTPATIENT_CLINIC_OR_DEPARTMENT_OTHER): Payer: Self-pay

## 2022-12-22 ENCOUNTER — Other Ambulatory Visit: Payer: Self-pay

## 2022-12-22 ENCOUNTER — Other Ambulatory Visit (HOSPITAL_BASED_OUTPATIENT_CLINIC_OR_DEPARTMENT_OTHER): Payer: Self-pay

## 2022-12-22 ENCOUNTER — Emergency Department (HOSPITAL_BASED_OUTPATIENT_CLINIC_OR_DEPARTMENT_OTHER)
Admission: EM | Admit: 2022-12-22 | Discharge: 2022-12-22 | Disposition: A | Discharge status: Home / Self Care | Payer: No Typology Code available for payment source | Attending: Emergency Medicine | Admitting: Emergency Medicine

## 2022-12-22 ENCOUNTER — Emergency Department (HOSPITAL_BASED_OUTPATIENT_CLINIC_OR_DEPARTMENT_OTHER): Payer: No Typology Code available for payment source | Admitting: Radiology

## 2022-12-22 ENCOUNTER — Emergency Department (HOSPITAL_BASED_OUTPATIENT_CLINIC_OR_DEPARTMENT_OTHER): Payer: No Typology Code available for payment source

## 2022-12-22 DIAGNOSIS — R519 Headache, unspecified: Secondary | ICD-10-CM | POA: Diagnosis present

## 2022-12-22 DIAGNOSIS — R0789 Other chest pain: Secondary | ICD-10-CM | POA: Diagnosis not present

## 2022-12-22 DIAGNOSIS — I10 Essential (primary) hypertension: Secondary | ICD-10-CM | POA: Diagnosis not present

## 2022-12-22 DIAGNOSIS — R1013 Epigastric pain: Secondary | ICD-10-CM | POA: Insufficient documentation

## 2022-12-22 DIAGNOSIS — Z87891 Personal history of nicotine dependence: Secondary | ICD-10-CM | POA: Insufficient documentation

## 2022-12-22 DIAGNOSIS — Z79899 Other long term (current) drug therapy: Secondary | ICD-10-CM | POA: Diagnosis not present

## 2022-12-22 LAB — TROPONIN I (HIGH SENSITIVITY)
Troponin I (High Sensitivity): 4 ng/L (ref <18)
Troponin I (High Sensitivity): 4 ng/L (ref <18)

## 2022-12-22 LAB — BASIC METABOLIC PANEL
Anion gap: 9 (ref 5–15)
BUN: 12 mg/dL (ref 8–23)
CO2: 29 mmol/L (ref 22–32)
Calcium: 9.1 mg/dL (ref 8.9–10.3)
Chloride: 102 mmol/L (ref 98–111)
Creatinine, Ser: 0.78 mg/dL (ref 0.44–1.00)
GFR, Estimated: >60 mL/min (ref >60)
Glucose, Bld: 90 mg/dL (ref 70–99)
Potassium: 4 mmol/L (ref 3.5–5.1)
Sodium: 140 mmol/L (ref 135–145)

## 2022-12-22 LAB — CBC
HCT: 42.9 % (ref 36.0–46.0)
Hemoglobin: 13.9 g/dL (ref 12.0–15.0)
MCH: 28.3 pg (ref 26.0–34.0)
MCHC: 32.4 g/dL (ref 30.0–36.0)
MCV: 87.2 fL (ref 80.0–100.0)
Platelets: 262 10*3/uL (ref 150–400)
RBC: 4.92 MIL/uL (ref 3.87–5.11)
RDW: 13.5 % (ref 11.5–15.5)
WBC: 5.5 10*3/uL (ref 4.0–10.5)
nRBC: 0 % (ref 0.0–0.2)

## 2022-12-22 MED ORDER — SODIUM CHLORIDE 0.9 % IV BOLUS
500.0000 mL | Freq: Once | INTRAVENOUS | Status: AC
  Administered 2022-12-22: 500 mL via INTRAVENOUS

## 2022-12-22 MED ORDER — DIPHENHYDRAMINE HCL 50 MG/ML IJ SOLN
25.0000 mg | Freq: Once | INTRAMUSCULAR | Status: AC
  Administered 2022-12-22: 25 mg via INTRAVENOUS
  Filled 2022-12-22: qty 1

## 2022-12-22 MED ORDER — FAMOTIDINE 20 MG PO TABS
20.0000 mg | ORAL_TABLET | Freq: Once | ORAL | Status: AC
  Administered 2022-12-22: 20 mg via ORAL
  Filled 2022-12-22: qty 1

## 2022-12-22 MED ORDER — KETOROLAC TROMETHAMINE 15 MG/ML IJ SOLN
15.0000 mg | Freq: Once | INTRAMUSCULAR | Status: AC
  Administered 2022-12-22: 15 mg via INTRAVENOUS
  Filled 2022-12-22: qty 1

## 2022-12-22 MED ORDER — METOCLOPRAMIDE HCL 5 MG/ML IJ SOLN
10.0000 mg | Freq: Once | INTRAMUSCULAR | Status: AC
  Administered 2022-12-22: 10 mg via INTRAVENOUS
  Filled 2022-12-22: qty 2

## 2022-12-22 NOTE — ED Provider Notes (Signed)
[place] [facility] DE[facility]MENT AT [facility] Provider Note   CSN: [id number] Arrival date & time: 12/22/22  7829     History  Chief Complaint  Patient presents with   Chest Pain   Headache    Kelsey Santana is a 62 y.o. female.   Chest Pain Associated symptoms: headache   Headache   62 year old female presents emergency department with complaints of chest pain, headache.  Patient states that symptoms began yesterday.  She reports feeling a slight headache yesterday morning which gradually worsened as the day went on.  Says she has a history of migraines but this 1 "feels different."  Headache located in frontal region behind left eye.  Denies any visual disturbance, gait abnormality, weakness/sensory deficit of the lower extremities, slurred speech, facial droop, anticoagulation use.  Patient describes chest pain occurring around the same time as headache began.  Described as centralized pressure without radiation.  Denies any known exacerbating or relieving factors.  She does state that she has had "sawdust type feeling" in the back of her throat with accompanying chest pain.  States she used to be on Protonix for treatment of reflux but is recently stopped taking said medication.  Denies fever, cough, nasal congestion, shortness of breath, nausea, vomiting, urinary symptoms, change in bowel habits.  Denies any history of DVT/PE, recent surgery/immobilization, known malignancy, known coagulopathy.  Past medical history significant for GERD, hypercholesterolemia, hypertension, migraine, prediabetes  Home Medications Prior to Admission medications   Medication Sig Start Date End Date Taking? Authorizing Provider  amLODipine (NORVASC) 5 MG tablet     [provider]  Cholecalciferol 125 MCG (5000 UT) TABS Take 1 tablet every day by oral route.    [provider]  Cholecalciferol 50 MCG (2000 UT) TABS Take 1 tablet by mouth daily. 03/25/20   [provider]  cyclobenzaprine (FLEXERIL) 10 MG tablet Take 1 tablet (10 mg total) by mouth 2 (two) times daily as needed for muscle spasms. 06/06/22   Raulkar, Drema Pry, MD  dihydroergotamine (MIGRANAL) 4 MG/ML nasal spray INSTILL ONE SPRAY IN NOSTRIL AS DIRECTED AS NEEDED FOR MIGRAINE (NO MORE THAN 4 SPRAYS IN 1 HOUR) (BULK) 12/20/22   Raulkar, Drema Pry, MD  hydrochlorothiazide (MICROZIDE) 12.5 MG capsule TAKE ONE CAPSULE BY MOUTH DAILY AT 9AM SCHEDULE OFFICE VISIT FOR FUTURE REFILLS 08/04/22   Raulkar, Drema Pry, MD  HYDROcodone-acetaminophen (NORCO) 10-325 MG tablet Take 1 tablet by mouth 3 (three) times daily as needed for severe pain. 12/21/22   Jones Bales, NP  ibuprofen (ADVIL) 200 MG tablet Take 2 tablets (400 mg total) by mouth every 8 (eight) hours as needed for headache. 02/17/19   Dhungel, Nishant, MD  lidocaine (XYLOCAINE) 5 % ointment Apply 1 application topically See admin instructions. APPLY TO THIGH DAILY AS NEEDED FOR PAIN 02/05/21   Raulkar, Drema Pry, MD  metFORMIN (GLUCOPHAGE) 500 MG tablet Take 1 tablet (500 mg total) by mouth daily with breakfast. 06/06/22   Raulkar, Drema Pry, MD  Multiple Vitamins-Minerals (MULTIVITAMIN) tablet Take 1 tablet by mouth daily. 09/19/19   Raulkar, Drema Pry, MD  nitroGLYCERIN (NITROSTAT) 0.4 MG SL tablet Place 1 tablet (0.4 mg total) under the tongue every 5 (five) minutes as needed for chest pain. 07/26/21   Raulkar, Drema Pry, MD  pantoprazole (PROTONIX) 20 MG tablet pantoprazole 20 mg tablet,delayed release  Take 1 tablet every day by oral route.    [provider]  Semaglutide, 2 MG/DOSE, (OZEMPIC, 2 MG/DOSE,) 8  MG/3ML SOPN Inject 1 mg into the skin once a week. 11/06/22   Raulkar, Drema Pry, MD  triamcinolone cream (KENALOG) 0.1 % Apply 1 Application topically 2 (two) times daily. To affected area till better 08/08/22   Zenia Resides, MD  Vitamin D, Ergocalciferol, (DRISDOL) 1.25 MG (50000 UNIT) CAPS capsule Take 1 capsule  (50,000 Units total) by mouth every 7 (seven) days. 11/17/21   Raulkar, Drema Pry, MD  Vitamin D, Ergocalciferol, (DRISDOL) 1.25 MG (50000 UNIT) CAPS capsule TAKE 1 CAPSULE BY MOUTH EVERY MONDAY AT 9AM (EVERY 7 DAYS) 08/04/22   Raulkar, Drema Pry, MD  Vitamin D, Ergocalciferol, (DRISDOL) 1.25 MG (50000 UNIT) CAPS capsule TAKE 1 CAPSULE BY MOUTH EVERY 7 DAYS 11/06/22   Raulkar, Drema Pry, MD      Allergies    Ciprofloxacin, Hydrocortisone, and Ace inhibitors    Review of Systems   Review of Systems  Cardiovascular:  Positive for chest pain.  Neurological:  Positive for headaches.  All other systems reviewed and are negative.   Physical Exam Updated Vital Signs BP (!) 126/90 (BP Location: Right Arm)   Pulse 66   Temp 98.2 F (36.8 C) (Oral)   Resp 20   Ht 5\' 4"  (1.626 m)   Wt 114.2 kg   SpO2 97%   BMI 43.22 kg/m  Physical Exam Vitals and nursing note reviewed.  Constitutional:      General: She is not in acute distress.    Appearance: She is well-developed.  HENT:     Head: Normocephalic and atraumatic.  Eyes:     Conjunctiva/sclera: Conjunctivae normal.  Cardiovascular:     Rate and Rhythm: Normal rate and regular rhythm.     Heart sounds: No murmur heard. Pulmonary:     Effort: Pulmonary effort is normal. No respiratory distress.     Breath sounds: Normal breath sounds. No stridor. No wheezing, rhonchi or rales.     Comments: Anterior chest wall tenderness to palpation. Chest:     Chest wall: Tenderness present.  Abdominal:     Palpations: Abdomen is soft.     Comments: Mild epigastric tenderness to palpation  Musculoskeletal:        General: No swelling.     Cervical back: Neck supple.     Right lower leg: No edema.     Left lower leg: No edema.  Skin:    General: Skin is warm and dry.     Capillary Refill: Capillary refill takes less than 2 seconds.  Neurological:     Mental Status: She is alert.     Comments: Alert and oriented to self, place, time and  event.   Speech is fluent, clear without dysarthria or dysphasia.   Strength 5/5 in upper/lower extremities   Sensation intact in upper/lower extremities   Normal gait.  CN I not tested  CN II not tested CN III, IV, VI PERRLA and EOMs intact bilaterally  CN V Intact sensation to sharp and light touch to the face  CN VII facial movements symmetric  CN VIII not tested  CN IX, X no uvula deviation, symmetric rise of soft palate  CN XI 5/5 SCM and trapezius strength bilaterally  CN XII Midline tongue protrusion, symmetric L/R movements   Psychiatric:        Mood and Affect: Mood normal.     ED Results / Procedures / Treatments   Labs (all labs ordered are listed, but only abnormal results are displayed) Labs Reviewed  BASIC  METABOLIC PANEL  CBC  TROPONIN I (HIGH SENSITIVITY)  TROPONIN I (HIGH SENSITIVITY)    EKG EKG Interpretation  Date/Time:  Friday Dec 22 2022 09:18:27 EDT Ventricular Rate:  55 PR Interval:  140 QRS Duration: 100 QT Interval:  424 QTC Calculation: 406 R Axis:   21 Text Interpretation: Sinus rhythm Atrial premature complexes Abnormal R-wave progression, early transition no sig change from previous Confirmed by Arby Barrette [phone]) on 12/22/2022 2:29:33 PM  Radiology DG Chest 2 View  Result Date: 12/22/2022 CLINICAL DATA:  Chest pain and tightness since yesterday, history hypertension EXAM: CHEST - 2 VIEW COMPARISON:  09/25/2021 FINDINGS: Normal heart size, mediastinal contours, and pulmonary vascularity. Atherosclerotic calcification aorta. Lungs clear. No pleural effusion or pneumothorax. Mild scattered endplate spur formation thoracic spine. IMPRESSION: No acute abnormalities. Aortic Atherosclerosis (ICD10-I70.0). Electronically Signed   By: Ulyses Southward M.D.   On: 12/22/2022 09:55   CT Head Wo Contrast  Result Date: 12/22/2022 CLINICAL DATA:  Provided history: Headache, increasing frequency or severity. Additional history provided: Chest pressure,  nausea and headache for one day. Shooting pain behind left eye. EXAM: CT HEAD WITHOUT CONTRAST TECHNIQUE: Contiguous axial images were obtained from the base of the skull through the vertex without intravenous contrast. RADIATION DOSE REDUCTION: This exam was performed according to the departmental dose-optimization program which includes automated exposure control, adjustment of the mA and/or kV according to patient size and/or use of iterative reconstruction technique. COMPARISON:  Brain MRI 04/11/2017.  Head CT 05/28/2013. FINDINGS: Brain: No age advanced or lobar predominant parenchymal atrophy. Mild patchy and ill-defined hypoattenuation within the cerebral white matter, nonspecific but most often secondary to chronic small vessel ischemia. Redemonstrated small chronic lacunar infarct within the left thalamus. There is no acute intracranial hemorrhage. No demarcated cortical infarct. No extra-axial fluid collection. No evidence of an intracranial mass. No midline shift. Vascular: No hyperdense vessel. Atherosclerotic calcifications. Skull: No fracture or aggressive osseous lesion. Sinuses/Orbits: No mass or acute finding within the imaged orbits. Moderate partial opacification of the right frontal sinus. Minimal mucosal thickening and secretions scattered within bilateral ethmoid air cells. IMPRESSION: 1.  No evidence of an acute intracranial abnormality. 2. Mild cerebral white matter disease, nonspecific but most often secondary to chronic small vessel ischemia. 3. Redemonstrated chronic lacunar infarct within the left thalamus. 4. Paranasal sinus disease at the imaged levels as described. Electronically Signed   By: Jackey Loge D.O.   On: 12/22/2022 09:54    Procedures Procedures    Medications Ordered in ED Medications  famotidine (PEPCID) tablet 20 mg (20 mg Oral Given 12/22/22 0951)  ketorolac (TORADOL) 15 MG/ML injection 15 mg (15 mg Intravenous Given 12/22/22 0947)  sodium chloride 0.9 % bolus 500  mL (0 mLs Intravenous Stopped 12/22/22 1100)  metoCLOPramide (REGLAN) injection 10 mg (10 mg Intravenous Given 12/22/22 0947)  diphenhydrAMINE (BENADRYL) injection 25 mg (25 mg Intravenous Given 12/22/22 0947)    ED Course/ Medical Decision Making/ A&P Clinical Course as of 12/22/22 1533  Fri Dec 22, 2022  1048 Reevaluation of the patient showed significant improvement of symptoms.  Awaiting second second troponin with expectant discharge [CR]    Clinical Course User Index [CR] Peter Garter, PA                             Medical Decision Making Amount and/or Complexity of Data Reviewed Labs: ordered. Radiology: ordered.  Risk Prescription drug management.   This patient  presents to the ED for concern of headache/chest pain, this involves an extensive number of treatment options, and is a complaint that carries with it a high risk of complications and morbidity.  The differential diagnosis includes CVA, cerebral venous thrombosis, carotid artery/vertebral artery dissection, malignancy, electrolyte derangement, ACS, musculoskeletal, PE, pneumothorax, aortic dissection, aortic aneurysm   Co morbidities that complicate the patient evaluation  See HPI   Additional history obtained:  Additional history obtained from EMR External records from outside source obtained and reviewed including hospital records   Lab Tests:  I Ordered, and personally interpreted labs.  The pertinent results include: No electrolyte abnormalities.  No renal dysfunction.  No leukocytosis.  No evidence anemia.  Platelets within range.  Initial troponin of 4 with repeat 4   Imaging Studies ordered:  I ordered imaging studies including chest x-ray, CT head I independently visualized and interpreted imaging which showed  Chest x-ray: No acute cardiopulmonary abnormalities CT head: No acute intracranial abnormality.  Mild cerebral white matter disease.  Redemonstrated chronic lacunar infarct within the  left thalamus.  Paranasal sinus disease. I agree with the radiologist interpretation  Cardiac Monitoring: / EKG:  The patient was maintained on a cardiac monitor.  I personally viewed and interpreted the cardiac monitored which showed an underlying rhythm of: Sinus rhythm with no evidence of PAC.  No obvious acute ischemic changes from prior EKG performed   Consultations Obtained:  N/a   Problem List / ED Course / Critical interventions / Medication management  Chest pain, headache I ordered medication including Toradol, Reglan, Benadryl, Pepcid, 500 cc normal saline f  Reevaluation of the patient after these medicines showed that the patient improved I have reviewed the patients home medicines and have made adjustments as needed   Social Determinants of Health:  Former cigarette use.  Denies illicit drug use.   Test / Admission - Considered:  Chest pain, headache Vitals signs within normal range and stable throughout visit. Laboratory/imaging studies significant for: See above 62 year old female presents emergency department with complaints of headache as well as chest pain.  Regarding headache, patient without any acute neurologic deficits and headache more frontal in nature.  Patient's headache responded well to migraine cocktail administered while in the emergency department with negative CT imaging at this time.  Patient also with chest pain, with reproducible chest wall tenderness to palpation as well as with some symptoms concerning for GERD given mild epigastric discomfort and "sawdust" feeling in throat with symptoms exacerbated with consumption of certain foods.  Low suspicion for ACS given delta negative troponin, lack of acute ischemic changes on EKG.  Doubt PE given lack of tachycardia, tachypnea, hypoxia or risk factors for PE.  Some suspicion for arterial dissection given onset of chest pain as well as headache but patient noted prompt response to medicines administered,  nonhypertensive without any acute neurologic deficit.  Shared decision-making conversation was had with patient regarding obtaining CT imaging of head, neck, chest with contrast to assess for possible dissection patient declined at this time given overall well-appearing nature after medications.  Patient recommended prompt return to emergency department if symptoms return/worsen.  Otherwise, close follow-up with primary care recommended for reevaluation of symptoms.  Treatment plan discussed at length with patient and she acknowledged understanding was agreeable to said plan. Worrisome signs and symptoms were discussed with the patient, and the patient acknowledged understanding to return to the ED if noticed. Patient was stable upon discharge. ]  Final Clinical Impression(s) / ED Diagnoses Final diagnoses:  Chest wall pain  Nonintractable headache, unspecified chronicity pattern, unspecified headache type    Rx / DC Orders ED Discharge Orders     None         Peter Garter, [place] 12/22/22 1534    Arby Barrette, MD 01/01/23 [phone]

## 2022-12-22 NOTE — ED Triage Notes (Signed)
Patient arrives with complaints of central chest pressure, nausea, and headache x1 day. Patient states that her headache is 9/10 pain.   She reports being on Ozempic for ~7 months.

## 2022-12-22 NOTE — Discharge Instructions (Signed)
Note the workup today was overall reassuring.  As discussed, if you change your mind and would like CT scans with contrast to take out the vessels of the heart and the neck/head, please do not hesitate to return.  Otherwise, recommend follow-up with primary care as well as cardiology outpatient for reassessment of your symptoms.  Please not hesitate to return to emergency department for worrisome signs and symptoms we discussed become apparent.

## 2023-01-03 ENCOUNTER — Encounter: Payer: 59 | Admitting: Registered Nurse

## 2023-01-29 ENCOUNTER — Encounter
Payer: No Typology Code available for payment source | Attending: Physical Medicine and Rehabilitation | Admitting: Registered Nurse

## 2023-01-29 DIAGNOSIS — M1712 Unilateral primary osteoarthritis, left knee: Secondary | ICD-10-CM | POA: Insufficient documentation

## 2023-01-29 NOTE — Progress Notes (Deleted)
Subjective:    Patient ID: Kelsey Santana, female    DOB: 30-Oct-1960, 62 y.o.   MRN: [id number]  HPI   Pain Inventory Average Pain {NUMBERS; 0-10:5044} Pain Right Now {NUMBERS; 0-10:5044} My pain is {PAIN DESCRIPTION:21022940}  In the last 24 hours, has pain interfered with the following? General activity {NUMBERS; 0-10:5044} Relation with others {NUMBERS; 0-10:5044} Enjoyment of life {NUMBERS; 0-10:5044} What TIME of day is your pain at its worst? {time of day:24191} Sleep (in general) {BHH GOOD/FAIR/POOR:22877}  Pain is worse with: {ACTIVITIES:21022942} Pain improves with: {PAIN IMPROVES WUJW:11914782} Relief from Meds: {NUMBERS; 0-10:5044}  Family History  Problem Relation Age of Onset   Alcohol abuse Mother    Diabetes Mother    Hyperlipidemia Mother    Hypertension Mother    Diabetes Brother    Hyperlipidemia Brother    Hypertension Brother    Thyroid disease Neg Hx    Social History   Socioeconomic History   Marital status: Single    Spouse name: Not on file   Number of children: Not on file   Years of education: Not on file   Highest education level: Not on file  Occupational History   Not on file  Tobacco Use   Smoking status: Former   Smokeless tobacco: Former   Tobacco comments:    quit 28 yrs ago  Vaping Use   Vaping Use: Never used  Substance and Sexual Activity   Alcohol use: No   Drug use: No   Sexual activity: Never  Other Topics Concern   Not on file  Social History Narrative   epworth sleepiness scale = 10 (11/15/15)    Lives alone in a 2 story home.  Has 3 children.  Currently not working.  Has lost 6 jobs in the past 2 months due to her leg numbness and pain.     Education: 2 years of college.   Social Determinants of Health   Financial Resource Strain: Not on file  Food Insecurity: Not on file  Transportation Needs: Not on file  Physical Activity: Not on file  Stress: Not on file  Social Connections: Not on file   Past  Surgical History:  Procedure Laterality Date   ABDOMINAL HYSTERECTOMY     complete   COLONOSCOPY N/A 01/16/2017   Procedure: COLONOSCOPY;  Surgeon: Jeani Hawking, MD;  Location: [facility];  Service: Endoscopy;  Laterality: N/A;   ESOPHAGOGASTRODUODENOSCOPY N/A 01/16/2017   Procedure: ESOPHAGOGASTRODUODENOSCOPY (EGD);  Surgeon: Jeani Hawking, MD;  Location: [facility];  Service: Endoscopy;  Laterality: N/A;   Past Surgical History:  Procedure Laterality Date   ABDOMINAL HYSTERECTOMY     complete   COLONOSCOPY N/A 01/16/2017   Procedure: COLONOSCOPY;  Surgeon: Jeani Hawking, MD;  Location: [facility];  Service: Endoscopy;  Laterality: N/A;   ESOPHAGOGASTRODUODENOSCOPY N/A 01/16/2017   Procedure: ESOPHAGOGASTRODUODENOSCOPY (EGD);  Surgeon: Jeani Hawking, MD;  Location: [facility];  Service: Endoscopy;  Laterality: N/A;   Past Medical History:  Diagnosis Date   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  Opioid Risk Score:   Fall Risk Score:  `1  Depression screen [facility] 2/9     12/21/2022    8:28 AM 10/26/2022    9:50 AM 09/22/2022    9:34 AM 08/22/2022    2:08  PM 07/12/2022   10:16 AM 06/12/2022   11:19 AM 04/04/2022   11:02 AM  Depression screen PHQ 2/9  Decreased Interest 0 1 0 1 0 0 1  Down, Depressed, Hopeless 0 1 0 1 0 0 1  PHQ - 2 Score 0 2 0 2 0 0 2    Review of Systems     Objective:   Physical Exam        Assessment & Plan:

## 2023-02-13 ENCOUNTER — Encounter (HOSPITAL_BASED_OUTPATIENT_CLINIC_OR_DEPARTMENT_OTHER): Payer: No Typology Code available for payment source | Admitting: Physical Medicine and Rehabilitation

## 2023-02-13 DIAGNOSIS — M1712 Unilateral primary osteoarthritis, left knee: Secondary | ICD-10-CM | POA: Diagnosis not present

## 2023-02-13 MED ORDER — HYDROCODONE-ACETAMINOPHEN 10-325 MG PO TABS: 1.0000 | ORAL_TABLET | Freq: Four times a day (QID) | ORAL | 0 refills | Status: DC | PRN

## 2023-02-13 NOTE — Progress Notes (Signed)
Subjective:    Patient ID: Kelsey Santana, female    DOB: 03-28-1961, 62 y.o.   MRN: [id number]  HPI:  An audio/video tele-health visit is felt to be the most appropriate encounter for this patient at this time. This is a follow up tele-visit via phone. The patient is at home. MD is at office. Prior to scheduling this appointment, our staff discussed the limitations of evaluation and management by telemedicine and the availability of in-person appointments. The patient expressed understanding and agreed to proceed.   1) Bilateral knee pain 2) obesity Kelsey Santana is a 62 y.o. female who returns for f/u appointment for chronic pain secondary to bilateral knee osteoarthritis, obesity, and migraines. She states her pain is located in her lower back radiating into her right lower extremity, bilateral hip pain R>L and bilateral knee pain R>L. She rates her pain 7. Her current exercise regime is walking and performing stretching exercises. -she lost a lot of weight, she has gone from 213 to 247 lbs and weight loss has since stalled -she had stopped eating sugars -right knee is feeling better but left is hurting more. She does not want a knee replacement -right knee aches more at night -can't climb up stairs -she has to order groceries from [place] -she has lost 70 lbs with the [facility]  [name] Morphine equivalent is 33.33 MME.  She is upset about about her weight gain and is consider bypass surgery, has lost weight with semalglutide but weight loss has plateued -she is mostly eating fruits now, not eating much protein or vegetables -CBGs dropped while taking semalgutide and metformin so she stopped metformin -CBGs now stable -she is only eating one meal per day -has diffuse bone pain Knee pain has been severe She has been sleeping poorly at night [facility] She is having severe migraines that have been responsive to Migranol in the past. She has failed  antidepressants, Depakote, opioids, topamax, Wellbutrin. She has noticied that opioids could trigger migraines when she is further from the dose. She has been taught elimination diet.  -has been very severe -she had trouble walking out of [employer] recently -she is not sure if the Zilretta was as effective as the steroid injections but would still like to try next visit  -she is considering gastric sleeve -she has to make herself eat -she has stopped sodas.  -she is trying to eat more protein -she bought Ensure Max -drinks water all day -lost 14 lbs since starting Ozempic! -only can take a couple of bites until she is full -she is trying to avoid bread, meat.  -she would like to try a medication -she does not want to try phenteramine when I shared that I have a patient who tried it and had a stroke -she would like to try Ozempic as she heard a friend lost a lot of weight on it and it curbed his appetite -she has been trying to eat healthier     Pain Inventory Average Pain 10 Pain Right Now 9 My pain is constant, sharp, and aching  In the last 24 hours, has pain interfered with the following? General activity 3 Relation with others 4 Enjoyment of life 1 What TIME of day is your pain at its worst? morning , daytime, evening, and night Sleep (in general) Poor  Pain is worse with: walking Pain improves with: medication and injections Relief from Meds: 9  Family History  Problem Relation Age  of Onset   Alcohol abuse Mother    Diabetes Mother    Hyperlipidemia Mother    Hypertension Mother    Diabetes Brother    Hyperlipidemia Brother    Hypertension Brother    Thyroid disease Neg Hx    Social History   Socioeconomic History   Marital status: Single    Spouse name: Not on file   Number of children: Not on file   Years of education: Not on file   Highest education level: Not on file  Occupational History   Not on file  Tobacco Use   Smoking status: Former   Smokeless  tobacco: Former   Tobacco comments:    quit 28 yrs ago  Vaping Use   Vaping Use: Never used  Substance and Sexual Activity   Alcohol use: No   Drug use: No   Sexual activity: Never  Other Topics Concern   Not on file  Social History Narrative   epworth sleepiness scale = 10 (11/15/15)    Lives alone in a 2 story home.  Has 3 children.  Currently not working.  Has lost 6 jobs in the past 2 months due to her leg numbness and pain.     Education: 2 years of college.   Social Determinants of Health   Financial Resource Strain: Not on file  Food Insecurity: Not on file  Transportation Needs: Not on file  Physical Activity: Not on file  Stress: Not on file  Social Connections: Not on file   Past Surgical History:  Procedure Laterality Date   ABDOMINAL HYSTERECTOMY     complete   COLONOSCOPY N/A 01/16/2017   Procedure: COLONOSCOPY;  Surgeon: Jeani Hawking, MD;  Location: [facility];  Service: Endoscopy;  Laterality: N/A;   ESOPHAGOGASTRODUODENOSCOPY N/A 01/16/2017   Procedure: ESOPHAGOGASTRODUODENOSCOPY (EGD);  Surgeon: Jeani Hawking, MD;  Location: [facility];  Service: Endoscopy;  Laterality: N/A;   Past Surgical History:  Procedure Laterality Date   ABDOMINAL HYSTERECTOMY     complete   COLONOSCOPY N/A 01/16/2017   Procedure: COLONOSCOPY;  Surgeon: Jeani Hawking, MD;  Location: [facility];  Service: Endoscopy;  Laterality: N/A;   ESOPHAGOGASTRODUODENOSCOPY N/A 01/16/2017   Procedure: ESOPHAGOGASTRODUODENOSCOPY (EGD);  Surgeon: Jeani Hawking, MD;  Location: [facility];  Service: Endoscopy;  Laterality: N/A;   Past Medical History:  Diagnosis Date   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  Opioid Risk Score:   Fall Risk Score:  `1  Depression screen [facility] 2/9     12/21/2022    8:28 AM 10/26/2022    9:50 AM 09/22/2022    9:34  AM 08/22/2022    2:08 PM 07/12/2022   10:16 AM 06/12/2022   11:19 AM 04/04/2022   11:02 AM  Depression screen PHQ 2/9  Decreased Interest 0 1 0 1 0 0 1  Down, Depressed, Hopeless 0 1 0 1 0 0 1  PHQ - 2 Score 0 2 0 2 0 0 2    Review of Systems  Musculoskeletal:  Positive for back pain, gait problem and joint swelling.       Bilateral knee pain  All other systems reviewed and are negative.      Objective:  Not performed    Assessment & Plan:  Right Lumbar Radiculitis: Continue  Gabapentin . Continue HEP as Tolerated. Continue to monitor.  Bilateral Greater Trochanter Tenderness: Continue to Alternate Ice and Heat Therapy. Continue to Monitor Bilateral Primary Osteoarthritis of Bilateral Knees: Continue  HEP as Tolerated . Continue to Monitor. Discussed that continued weight loss with also help her knee pain.  inserted into the knee joint. After negative draw back for blood, a solution containing Synvisc was administered. The patient tolerated the procedure well. Post procedure instructions were given. It was repeated bilaterally  Chronic Pain Syndrome 2/2 knee OA: Increase hydrocodone to 10mg  QID prn We will continue the opioid monitoring program, this consists of regular clinic visits, examinations, urine drug screen, pill counts as well as use of [place] Controlled Substance Reporting system.   5. Obesity:  -weight is 246 lbs -continue wegovy -encouraged resistance training -advised to make sure her morning protein shake has no added sugar or artifical sweeteners other than allulose or stevia Discussed following criteria for Wegovy: 1) Patient has diagnosis of obesity; 2) Patient must be 75 years of age or older; 3) The patient has been involved in a physician or dietitian monitored weight loss program, consisting of both low-calorie diet, increased physical activity and behavioral counseling for a minimum of 6 months without a 3% loss from baseline; 4) Patients BMI is one of the  following: a) 30kg/m or greater; b) 27 29.99 kg/m in the presence of at least one weight related comorbidity such as coronary heart disease, dyslipidemia, hypertension, symptomatic arthritis of the lower extremities, type 2 diabetes mellitus, obstructive sleep apnea; c) 25-29.9 kg/m2 and waist circumference is &gt; 40 inches for males or &gt; 35 inches for females;5) Not Met - Must have tried and failed at least one preferred oral agent such as Phentermine; 6) Patient has no labeled contraindication; 7) Patient is not taking another weight loss medication; 8) The dose is within approved product labeling guidelines; 10) The patient must not be taking another GLP-1 receptor agonist AND the patient must not be taking insulin concurrently.  -encouraged stopping eating added sugars -continue nuts -discussed eating protein daily -resistance training daily.  -recommended protein sources -continue avoiding bread.  -recommended eating eggs rather than Ensure Max.  -continue shrimp as source of protein. Mercury in salmon may be contributing to her joint symptoms. Advised to stop sodas and made plan to do so. She did not tolerate topamax well. Discussed gastric bypass surgery and gastric sleeve -prescribed ozempic, discussed its mechanism of action, side effects, increase dose 2mg  -Discussed the benefits of intermittent fasting. Recommended starting with pushing dinner 15 minutes earlier and when this feels easy, continuing to push dinner 15 minutes earlier. Discussed that this can help her body to improve its ability to burn fat rather than glucose, improving insulin sensitivity. Recommended drinking Roobois tea in the evening to help curb appetite and for its numerous health benefits.   -discussed we can consider increasing metformin if she does not tolerate ozempic  6. Insomnia: -Try to go outside near sunrise -Get exercise during the day.  -Turn off all devices an hour before bedtime.  -Teas that can  benefit: chamomile, valerian root, Brahmi (Bacopa) -Can consider over the counter melatonin, magnesium, and/or L-theanine. Melatonin is an anti-oxidant with multiple health benefits. Magnesium is involved in greater than 300 enzymatic reactions in the body and most of [place] are deficient as our soil is often depleted. There are 7 different types of magnesium- Bioptemizer's is a supplement with all 7 types, and each has unique benefits. Magnesium  can also help with constipation and anxiety.  -Pistachios naturally increase the production of melatonin -Cozy Earth bamboo bed sheets are free from toxic chemicals.  -Tart cherry juice or a tart cherry supplement can improve sleep and soreness post-workout   7. Migraines:  -continue migranol for her headaches- this has worked for her in the past and she has failed multiple other medications (listed above)  8) Vitamin D deficiency: Continue ergocalciferol 50,000U once per week for 14 weeks.   13 minutes spent in discussing her weight loss, positive response to [facility] with stalled weight loss, her current diet, her left knee pain, increasing hydrocodone frequency

## 2023-02-20 ENCOUNTER — Ambulatory Visit: Payer: 59 | Admitting: Physical Medicine and Rehabilitation

## 2023-03-15 ENCOUNTER — Telehealth: Payer: Self-pay | Admitting: Registered Nurse

## 2023-03-15 MED ORDER — HYDROCODONE-ACETAMINOPHEN 10-325 MG PO TABS: 1.0000 | ORAL_TABLET | Freq: Four times a day (QID) | ORAL | 0 refills | Status: DC | PRN

## 2023-03-15 NOTE — Telephone Encounter (Signed)
The patient called and said her medicine runs out today. She is going out of town and wont be back until her appt with dr Carlis Abbott on the 30th.

## 2023-03-15 NOTE — Telephone Encounter (Signed)
Refill- Hydrocodone

## 2023-03-15 NOTE — Telephone Encounter (Signed)
PMP was Reviewed. Hydrocodone e-scribed to pharmacy. Call placed to Ms. Gorley, no answer. Left message to return the call.

## 2023-03-16 ENCOUNTER — Other Ambulatory Visit: Payer: Self-pay | Admitting: Physical Medicine and Rehabilitation

## 2023-03-20 ENCOUNTER — Encounter: Payer: 59 | Attending: Physical Medicine and Rehabilitation | Admitting: Physical Medicine and Rehabilitation

## 2023-03-20 ENCOUNTER — Telehealth: Payer: Self-pay | Admitting: *Deleted

## 2023-03-20 VITALS — BP 156/93 | HR 87 | Ht 64.0 in | Wt 252.0 lb

## 2023-03-20 DIAGNOSIS — M25562 Pain in left knee: Secondary | ICD-10-CM | POA: Insufficient documentation

## 2023-03-20 DIAGNOSIS — Z5181 Encounter for therapeutic drug level monitoring: Secondary | ICD-10-CM | POA: Diagnosis present

## 2023-03-20 DIAGNOSIS — G4700 Insomnia, unspecified: Secondary | ICD-10-CM

## 2023-03-20 DIAGNOSIS — I10 Essential (primary) hypertension: Secondary | ICD-10-CM | POA: Diagnosis not present

## 2023-03-20 DIAGNOSIS — M25552 Pain in left hip: Secondary | ICD-10-CM | POA: Diagnosis not present

## 2023-03-20 DIAGNOSIS — M25551 Pain in right hip: Secondary | ICD-10-CM | POA: Diagnosis not present

## 2023-03-20 DIAGNOSIS — M170 Bilateral primary osteoarthritis of knee: Secondary | ICD-10-CM | POA: Diagnosis not present

## 2023-03-20 DIAGNOSIS — G4701 Insomnia due to medical condition: Secondary | ICD-10-CM | POA: Diagnosis not present

## 2023-03-20 DIAGNOSIS — G43909 Migraine, unspecified, not intractable, without status migrainosus: Secondary | ICD-10-CM

## 2023-03-20 DIAGNOSIS — Z79891 Long term (current) use of opiate analgesic: Secondary | ICD-10-CM | POA: Insufficient documentation

## 2023-03-20 DIAGNOSIS — Z6841 Body Mass Index (BMI) 40.0 and over, adult: Secondary | ICD-10-CM | POA: Diagnosis not present

## 2023-03-20 DIAGNOSIS — M25561 Pain in right knee: Secondary | ICD-10-CM | POA: Diagnosis not present

## 2023-03-20 DIAGNOSIS — M5416 Radiculopathy, lumbar region: Secondary | ICD-10-CM | POA: Insufficient documentation

## 2023-03-20 DIAGNOSIS — E669 Obesity, unspecified: Secondary | ICD-10-CM | POA: Insufficient documentation

## 2023-03-20 DIAGNOSIS — E559 Vitamin D deficiency, unspecified: Secondary | ICD-10-CM | POA: Diagnosis not present

## 2023-03-20 DIAGNOSIS — G8929 Other chronic pain: Secondary | ICD-10-CM

## 2023-03-20 DIAGNOSIS — G894 Chronic pain syndrome: Secondary | ICD-10-CM | POA: Insufficient documentation

## 2023-03-20 MED ORDER — ZEPBOUND 2.5 MG/0.5ML ~~LOC~~ SOAJ: 2.5000 mg | SUBCUTANEOUS | 3 refills | Status: DC

## 2023-03-20 MED ORDER — AMLODIPINE BESYLATE 5 MG PO TABS: 5.0000 mg | ORAL_TABLET | Freq: Every day | ORAL | 3 refills | Status: DC

## 2023-03-20 MED ORDER — HYDROCODONE-ACETAMINOPHEN 10-325 MG PO TABS: 1.0000 | ORAL_TABLET | Freq: Four times a day (QID) | ORAL | 0 refills | Status: DC | PRN

## 2023-03-20 NOTE — Progress Notes (Signed)
Subjective:    Patient ID: Kelsey Santana, female    DOB: Feb 23, 1961, 62 y.o.   MRN: [id number]  HPI:   1) Bilateral knee pain -they had to help her out of church because of her knee pain -[place] -would like to get an XR  2) obesity Kelsey Santana is a 62 y.o. female who returns for f/u appointment for chronic pain secondary to bilateral knee osteoarthritis, obesity, and migraines. She states her pain is located in her lower back radiating into her right lower extremity, bilateral hip pain R>L and bilateral knee pain R>L. She rates her pain 7. Her current exercise regime is walking and performing stretching exercises. -she lost a lot of weight, she has gone from 213 to 247 lbs and weight loss has since stalled -she had stopped eating sugars -right knee is feeling better but left is hurting more. She does not want a knee replacement -right knee aches more at night -can't climb up stairs -she has to order groceries from [place] -she has lost 70 lbs with the [facility]  Kelsey Santana equivalent is 33.33 MME.  She is upset about about her weight gain and is consider bypass surgery, has lost weight with semalglutide but weight loss has plateued -she is mostly eating fruits now, not eating much protein or vegetables -CBGs dropped while taking semalgutide and metformin so she stopped metformin -CBGs now stable -she is only eating one meal per day -has diffuse bone pain Knee pain has been severe She has been sleeping poorly at night [facility] She is having severe migraines that have been responsive to Migranol in the past. She has failed antidepressants, Depakote, opioids, topamax, Wellbutrin. She has noticied that opioids could trigger migraines when she is further from the dose. She has been taught elimination diet.  -has been very severe -she had trouble walking out of [employer] recently -she is not sure if the Zilretta was as  effective as the steroid injections but would still like to try next visit  -she is considering gastric sleeve -she has to make herself eat -she has stopped sodas.  -she is trying to eat more protein -she bought Ensure Max -drinks water all day -lost 14 lbs since starting Ozempic! -only can take a couple of bites until she is full -she is trying to avoid bread, meat.  -she would like to try a medication -she does not want to try phenteramine when I shared that I have a patient who tried it and had a stroke -she would like to try Ozempic as she heard a friend lost a lot of weight on it and it curbed his appetite -she has been trying to eat healthier   3) Insomnia:  -sleeping poorly due to her pain and grief over son's death   Pain Inventory Average Pain 10 Pain Right Now 8 My pain is constant, sharp, and aching stabbing  In the last 24 hours, has pain interfered with the following? General activity  Relation with others  Enjoyment of life 1 What TIME of day is your pain at its worst? morning , daytime, evening, and night Sleep (in general) Poor  Pain is worse with: walking bending sitting standing Pain improves with: medication and injections rest Relief from Meds:   Family History  Problem Relation Age of Onset   Alcohol abuse Mother    Diabetes Mother    Hyperlipidemia Mother  Hypertension Mother    Diabetes Brother    Hyperlipidemia Brother    Hypertension Brother    Thyroid disease Neg Hx    Social History   Socioeconomic History   Marital status: Single    Spouse name: Not on file   Number of children: Not on file   Years of education: Not on file   Highest education level: Not on file  Occupational History   Not on file  Tobacco Use   Smoking status: Former   Smokeless tobacco: Former   Tobacco comments:    quit 28 yrs ago  Vaping Use   Vaping status: Never Used  Substance and Sexual Activity   Alcohol use: No   Drug use: No   Sexual activity:  Never  Other Topics Concern   Not on file  Social History Narrative   epworth sleepiness scale = 10 (11/15/15)    Lives alone in a 2 story home.  Has 3 children.  Currently not working.  Has lost 6 jobs in the past 2 months due to her leg numbness and pain.     Education: 2 years of college.   Social Determinants of Health   Financial Resource Strain: Not on file  Food Insecurity: Not on file  Transportation Needs: Not on file  Physical Activity: Not on file  Stress: Not on file  Social Connections: Unknown (01/02/2022)   Received from [facility]   Social Network    Social Network: Not on file   Past Surgical History:  Procedure Laterality Date   ABDOMINAL HYSTERECTOMY     complete   COLONOSCOPY N/A 01/16/2017   Procedure: COLONOSCOPY;  Surgeon: Jeani Hawking, MD;  Location: [facility];  Service: Endoscopy;  Laterality: N/A;   ESOPHAGOGASTRODUODENOSCOPY N/A 01/16/2017   Procedure: ESOPHAGOGASTRODUODENOSCOPY (EGD);  Surgeon: Jeani Hawking, MD;  Location: [facility];  Service: Endoscopy;  Laterality: N/A;   Past Surgical History:  Procedure Laterality Date   ABDOMINAL HYSTERECTOMY     complete   COLONOSCOPY N/A 01/16/2017   Procedure: COLONOSCOPY;  Surgeon: Jeani Hawking, MD;  Location: [facility];  Service: Endoscopy;  Laterality: N/A;   ESOPHAGOGASTRODUODENOSCOPY N/A 01/16/2017   Procedure: ESOPHAGOGASTRODUODENOSCOPY (EGD);  Surgeon: Jeani Hawking, MD;  Location: [facility];  Service: Endoscopy;  Laterality: N/A;   Past Medical History:  Diagnosis Date   Back pain    l 4 and l5 djd   GERD (gastroesophageal reflux disease)    Hypercholesteremia    Hypertension    Migraine    Pre-diabetes    Ht 5\' 4"  (1.626 m)   BMI 43.22 kg/m   Opioid Risk Score:   Fall Risk Score:  `1  Depression screen [facility] 2/9     12/21/2022    8:28 AM 10/26/2022    9:50 AM 09/22/2022    9:34 AM 08/22/2022    2:08 PM 07/12/2022   10:16 AM 06/12/2022   11:19 AM 04/04/2022   11:02 AM   Depression screen PHQ 2/9  Decreased Interest 0 1 0 1 0 0 1  Down, Depressed, Hopeless 0 1 0 1 0 0 1  PHQ - 2 Score 0 2 0 2 0 0 2    Review of Systems  Musculoskeletal:  Positive for back pain, gait problem and joint swelling.       Bilateral knee pain  All other systems reviewed and are negative.      Objective:  Gen: no distress, normal appearing HEENT: oral mucosa pink and moist, NCAT Cardio:  Reg rate Chest: normal effort, normal rate of breathing Abd: soft, non-distended Ext: no edema Psych: pleasant, normal affect Skin: intact Neuro: Alert and oriented MSK: bilateral knee TTP    Assessment & Plan:  Right Lumbar Radiculitis: Continue Gabapentin . Continue HEP as Tolerated. Continue to monitor.   Bilateral Greater Trochanter Tenderness: Continue to Alternate Ice and Heat Therapy. Continue to Monitor  Bilateral Primary Osteoarthritis of Bilateral Knees: Continue  HEP as Tolerated . Continue to Monitor. Discussed that continued weight loss with also help her knee pain.  inserted into the knee joint. After negative draw back for blood, a solution containing Synvisc was administered. The patient tolerated the procedure well. Post procedure instructions were given. It was repeated bilaterally  Chronic Pain Syndrome 2/2 knee OA: Refilled hydrocodone to 10mg  QID prn We will continue the opioid monitoring program, this consists of regular clinic visits, examinations, urine drug screen, pill counts as well as use of [place] Controlled Substance Reporting system.  UDS today  5. Obesity:  -weight is 252 lbs, discussed that she is maintaining weight  -discussed that Reginal Lutes has plataued, will transition to tirzepatide  -encouraged resistance training -advised to make sure her morning protein shake has no added sugar or artifical sweeteners other than allulose or stevia Discussed following criteria for [facility]: 1) Patient has diagnosis of obesity; 2) Patient must be 46 years of  age or older; 3) The patient has been involved in a physician or dietitian monitored weight loss program, consisting of both low-calorie diet, increased physical activity and behavioral counseling for a minimum of 6 months without a 3% loss from baseline; 4) Patients BMI is one of the following: a) 30kg/m or greater; b) 27 29.99 kg/m in the presence of at least one weight related comorbidity such as coronary heart disease, dyslipidemia, hypertension, symptomatic arthritis of the lower extremities, type 2 diabetes mellitus, obstructive sleep apnea; c) 25-29.9 kg/m2 and waist circumference is &gt; 40 inches for males or &gt; 35 inches for females;5) Not Met - Must have tried and failed at least one preferred oral agent such as Phentermine; 6) Patient has no labeled contraindication; 7) Patient is not taking another weight loss medication; 8) The dose is within approved product labeling guidelines; 10) The patient must not be taking another GLP-1 receptor agonist AND the patient must not be taking insulin concurrently.  -encouraged stopping eating added sugars -continue nuts -discussed eating protein daily -resistance training daily.  -recommended protein sources -continue avoiding bread.  -recommended eating eggs rather than Ensure Max.  -continue shrimp as source of protein. Mercury in salmon may be contributing to her joint symptoms. Advised to stop sodas and made plan to do so. She did not tolerate topamax well. Discussed gastric bypass surgery and gastric sleeve -prescribed ozempic, discussed its mechanism of action, side effects, increase dose 2mg  -Discussed the benefits of intermittent fasting. Recommended starting with pushing dinner 15 minutes earlier and when this feels easy, continuing to push dinner 15 minutes earlier. Discussed that this can help her body to improve its ability to burn fat rather than glucose, improving insulin sensitivity. Recommended drinking Roobois tea in the evening to help  curb appetite and for its numerous health benefits.   -discussed we can consider increasing metformin if she does not tolerate ozempic  6. Insomnia: -Try to go outside near sunrise -Get exercise during the day.  -Turn off all devices an hour before bedtime.  -Teas that can benefit: chamomile, valerian root, Brahmi (Bacopa) -Can consider over  the counter melatonin, magnesium, and/or L-theanine. Melatonin is an anti-oxidant with multiple health benefits. Magnesium is involved in greater than 300 enzymatic reactions in the body and most of [place] are deficient as our soil is often depleted. There are 7 different types of magnesium- Bioptemizer's is a supplement with all 7 types, and each has unique benefits. Magnesium can also help with constipation and anxiety.  -Pistachios naturally increase the production of melatonin -Cozy Earth bamboo bed sheets are free from toxic chemicals.  -Tart cherry juice or a tart cherry supplement can improve sleep and soreness post-workout   7. Migraines:  -continue migranol for her headaches- this has worked for her in the past and she has failed multiple other medications (listed above)  8) Vitamin D deficiency: Continue ergocalciferol 50,000U once per week for 14 weeks.   9) HTN: -sent amlodipine instead of Lisinopril because latter is causing her to cough  13 minutes spent in discussing her weight loss, positive response to [facility] with stalled weight loss, her current diet, her left knee pain, increasing hydrocodone frequency

## 2023-03-20 NOTE — Telephone Encounter (Signed)
Kelsey Santana ([employer]) - [id number] Zepbound 2.5MG /0.5ML pen-injectors Status: PA Response -    DeniedCreated: July 30th, 2024 [phone]

## 2023-03-20 NOTE — Telephone Encounter (Signed)
Zepbound Beatrice Lecher [employer] - PA Case ID: [id number] - Rx #: [id number]

## 2023-03-21 NOTE — Telephone Encounter (Signed)
Patient notified

## 2023-03-24 ENCOUNTER — Other Ambulatory Visit: Payer: Self-pay | Admitting: Physical Medicine and Rehabilitation

## 2023-03-29 ENCOUNTER — Encounter: Payer: 59 | Attending: Physical Medicine and Rehabilitation | Admitting: Physical Medicine and Rehabilitation

## 2023-03-29 VITALS — BP 153/96 | HR 58 | Ht 64.0 in | Wt 246.0 lb

## 2023-03-29 DIAGNOSIS — Z5181 Encounter for therapeutic drug level monitoring: Secondary | ICD-10-CM | POA: Insufficient documentation

## 2023-03-29 DIAGNOSIS — G8929 Other chronic pain: Secondary | ICD-10-CM

## 2023-03-29 DIAGNOSIS — M5416 Radiculopathy, lumbar region: Secondary | ICD-10-CM | POA: Insufficient documentation

## 2023-03-29 DIAGNOSIS — M545 Low back pain, unspecified: Secondary | ICD-10-CM | POA: Insufficient documentation

## 2023-03-29 DIAGNOSIS — G894 Chronic pain syndrome: Secondary | ICD-10-CM | POA: Diagnosis not present

## 2023-03-29 DIAGNOSIS — M25561 Pain in right knee: Secondary | ICD-10-CM | POA: Insufficient documentation

## 2023-03-29 DIAGNOSIS — I10 Essential (primary) hypertension: Secondary | ICD-10-CM | POA: Insufficient documentation

## 2023-03-29 DIAGNOSIS — G4700 Insomnia, unspecified: Secondary | ICD-10-CM | POA: Insufficient documentation

## 2023-03-29 DIAGNOSIS — Z79891 Long term (current) use of opiate analgesic: Secondary | ICD-10-CM | POA: Insufficient documentation

## 2023-03-29 DIAGNOSIS — M25562 Pain in left knee: Secondary | ICD-10-CM | POA: Diagnosis present

## 2023-03-29 DIAGNOSIS — M25552 Pain in left hip: Secondary | ICD-10-CM | POA: Diagnosis not present

## 2023-03-29 DIAGNOSIS — M170 Bilateral primary osteoarthritis of knee: Secondary | ICD-10-CM | POA: Diagnosis not present

## 2023-03-29 DIAGNOSIS — M25551 Pain in right hip: Secondary | ICD-10-CM | POA: Insufficient documentation

## 2023-03-29 MED ORDER — OZEMPIC (2 MG/DOSE) 8 MG/3ML ~~LOC~~ SOPN: 1.0000 | PEN_INJECTOR | SUBCUTANEOUS | 3 refills | Status: DC

## 2023-03-29 MED ORDER — BETAMETHASONE SOD PHOS & ACET 6 (3-3) MG/ML IJ SUSP
12.0000 mg | Freq: Once | INTRAMUSCULAR | Status: AC
Start: 2023-03-29 — End: 2023-03-29
  Administered 2023-03-29: 12 mg via INTRAMUSCULAR

## 2023-03-29 MED ORDER — LIDOCAINE HCL 1 % IJ SOLN
6.0000 mL | Freq: Once | INTRAMUSCULAR | Status: AC
Start: 2023-03-29 — End: 2023-03-29
  Administered 2023-03-29: 6 mL

## 2023-03-29 NOTE — Progress Notes (Signed)
Subjective:    Patient ID: Kelsey Santana, female    DOB: 06/26/1961, 62 y.o.   MRN: [id number]  HPI:   1) Bilateral knee pain -they had to help her out of church because of her knee pain -[place] the [place] -would like to get an XR -she would like bilateral steroid injections  2) obesity Kelsey Santana is a 62 y.o. female who returns for f/u appointment for chronic pain secondary to bilateral knee osteoarthritis, obesity, and migraines. She states her pain is located in her lower back radiating into her right lower extremity, bilateral hip pain R>L and bilateral knee pain R>L. She rates her pain 7. Her current exercise regime is walking and performing stretching exercises. -she lost a lot of weight, she has gone from 213 to 247 lbs and weight loss has since stalled -she is continuing on the Ozempic 2mg  -she had stopped eating sugars,  -right knee is feeling better but left is hurting more. She does not want a knee replacement -right knee aches more at night -can't climb up stairs -she has to order groceries from [place] -she has lost 77 lbs with the [facility]  Kelsey Santana equivalent is 33.33 MME.  She is upset about about her weight gain and is consider bypass surgery, has lost weight with semalglutide but weight loss has plateued -she is mostly eating fruits now, not eating much protein or vegetables -CBGs dropped while taking semalgutide and metformin so she stopped metformin -CBGs now stable -she is only eating one meal per day -has diffuse bone pain Knee pain has been severe She has been sleeping poorly at night She [facility] [employer] She is having severe migraines that have been responsive to Migranol in the past. She has failed antidepressants, Depakote, opioids, topamax, Wellbutrin. She has noticied that opioids could trigger migraines when she is further from the dose. She has been taught elimination diet.  -has been very severe -she had  trouble walking out of [employer] recently -she is not sure if the Zilretta was as effective as the steroid injections but would still like to try next visit  -she is considering gastric sleeve -she has to make herself eat -she has stopped sodas.  -she is trying to eat more protein -she bought Ensure Max -drinks water all day -lost 14 lbs since starting Ozempic! -only can take a couple of bites until she is full -she is trying to avoid bread, meat.  -she would like to try a medication -she does not want to try phenteramine when I shared that I have a patient who tried it and had a stroke -she would like to try Ozempic as she heard a friend lost a lot of weight on it and it curbed his appetite -she has been trying to eat healthier   3) Insomnia:  -sleeping poorly due to her pain and grief over son's death  4) Lower back pain: -low back pain present  5) Bilateral hip pain: -thinks this is stemming from her knee pain   Pain Inventory Average Pain 10 Pain Right Now 8 My pain is constant, sharp, and aching stabbing  In the last 24 hours, has pain interfered with the following? General activity  Relation with others  Enjoyment of life 1 What TIME of day is your pain at its worst? morning , daytime, evening, and night Sleep (in general) Poor  Pain is worse with: walking bending sitting standing Pain improves  with: medication and injections rest Relief from Meds:   Family History  Problem Relation Age of Onset   Alcohol abuse Mother    Diabetes Mother    Hyperlipidemia Mother    Hypertension Mother    Diabetes Brother    Hyperlipidemia Brother    Hypertension Brother    Thyroid disease Neg Hx    Social History   Socioeconomic History   Marital status: Single    Spouse name: Not on file   Number of children: Not on file   Years of education: Not on file   Highest education level: Not on file  Occupational History   Not on file  Tobacco Use   Smoking status: Former    Smokeless tobacco: Former   Tobacco comments:    quit 28 yrs ago  Vaping Use   Vaping status: Never Used  Substance and Sexual Activity   Alcohol use: No   Drug use: No   Sexual activity: Never  Other Topics Concern   Not on file  Social History Narrative   epworth sleepiness scale = 10 (11/15/15)    Lives alone in a 2 story home.  Has 3 children.  Currently not working.  Has lost 6 jobs in the past 2 months due to her leg numbness and pain.     Education: 2 years of college.   Social Determinants of Health   Financial Resource Strain: Not on file  Food Insecurity: Not on file  Transportation Needs: Not on file  Physical Activity: Not on file  Stress: Not on file  Social Connections: Unknown (01/02/2022)   Received from [facility]   Social Network    Social Network: Not on file   Past Surgical History:  Procedure Laterality Date   ABDOMINAL HYSTERECTOMY     complete   COLONOSCOPY N/A 01/16/2017   Procedure: COLONOSCOPY;  Surgeon: Jeani Hawking, MD;  Location: [facility];  Service: Endoscopy;  Laterality: N/A;   ESOPHAGOGASTRODUODENOSCOPY N/A 01/16/2017   Procedure: ESOPHAGOGASTRODUODENOSCOPY (EGD);  Surgeon: Jeani Hawking, MD;  Location: [facility];  Service: Endoscopy;  Laterality: N/A;   Past Surgical History:  Procedure Laterality Date   ABDOMINAL HYSTERECTOMY     complete   COLONOSCOPY N/A 01/16/2017   Procedure: COLONOSCOPY;  Surgeon: Jeani Hawking, MD;  Location: [facility];  Service: Endoscopy;  Laterality: N/A;   ESOPHAGOGASTRODUODENOSCOPY N/A 01/16/2017   Procedure: ESOPHAGOGASTRODUODENOSCOPY (EGD);  Surgeon: Jeani Hawking, MD;  Location: [facility];  Service: Endoscopy;  Laterality: N/A;   Past Medical History:  Diagnosis Date   Back pain    l 4 and l5 djd   GERD (gastroesophageal reflux disease)    Hypercholesteremia    Hypertension    Migraine    Pre-diabetes    BP (!) 172/95   Pulse (!) 58   Ht 5\' 4"  (1.626 m)   Wt 246 lb (111.6 kg)    SpO2 98%   BMI 42.23 kg/m   Opioid Risk Score:   Fall Risk Score:  `1  Depression screen [facility] 2/9     03/20/2023    9:40 AM 12/21/2022    8:28 AM 10/26/2022    9:50 AM 09/22/2022    9:34 AM 08/22/2022    2:08 PM 07/12/2022   10:16 AM 06/12/2022   11:19 AM  Depression screen PHQ 2/9  Decreased Interest 1 0 1 0 1 0 0  Down, Depressed, Hopeless 1 0 1 0 1 0 0  PHQ - 2 Score 2 0 2 0  2 0 0    Review of Systems  Musculoskeletal:  Positive for back pain, gait problem and joint swelling.       Bilateral knee pain  All other systems reviewed and are negative.      Objective:  Gen: no distress, normal appearing HEENT: oral mucosa pink and moist, NCAT Cardio: Reg rate Chest: normal effort, normal rate of breathing Abd: soft, non-distended Ext: no edema Psych: pleasant, normal affect Skin: intact MSK: bilateral knee TTP    Assessment & Plan:  Right Lumbar Radiculitis: Continue Gabapentin . Continue HEP as Tolerated. Continue to monitor. XR ordered  Bilateral Greater Trochanter Tenderness: Continue to Alternate Ice and Heat Therapy. Continue to Monitor. X-rays ordered to assess for OA  Bilateral Primary Osteoarthritis of Bilateral Knees: Continue  HEP as Tolerated . Continue to Monitor. Discussed that continued weight loss with also help her knee pain.  inserted into the knee joint.   Knee injection, bilateral  Indication: Bilateral knee pain not relieved by medication management and other conservative care.  Informed consent was obtained after describing risks and benefits of the procedure with the patient, this includes bleeding, bruising, infection and medication side effects. The patient wishes to proceed and has given written consent. The patient was placed in a recumbent position. The medial aspect of the knee was marked and prepped with Betadine and alcohol. It was then entered with a 25-gauge 1-1/2 inch needle and 3 mL of 1% lidocaine and 1mL celestone were injected into bilateral  knee joints. The patient tolerated the procedure well. Post procedure instructions were given.   Chronic Pain Syndrome 2/2 knee OA: Refilled hydrocodone to 10mg  QID prn We will continue the opioid monitoring program, this consists of regular clinic visits, examinations, urine drug screen, pill counts as well as use of [place] Controlled Substance Reporting system.  UDS reviewed and with expected metabilities  5. Obesity:  -weight is 246 lbs, discussed that she is doing a great job with her diet! -refilled Ozempic since Zepbound was denied  -encouraged resistance training -advised to make sure her morning protein shake has no added sugar or artifical sweeteners other than allulose or stevia Discussed following criteria for Wegovy: 1) Patient has diagnosis of obesity; 2) Patient must be 107 years of age or older; 3) The patient has been involved in a physician or dietitian monitored weight loss program, consisting of both low-calorie diet, increased physical activity and behavioral counseling for a minimum of 6 months without a 3% loss from baseline; 4) Patients BMI is one of the following: a) 30kg/m or greater; b) 27 29.99 kg/m in the presence of at least one weight related comorbidity such as coronary heart disease, dyslipidemia, hypertension, symptomatic arthritis of the lower extremities, type 2 diabetes mellitus, obstructive sleep apnea; c) 25-29.9 kg/m2 and waist circumference is &gt; 40 inches for males or &gt; 35 inches for females;5) Not Met - Must have tried and failed at least one preferred oral agent such as Phentermine; 6) Patient has no labeled contraindication; 7) Patient is not taking another weight loss medication; 8) The dose is within approved product labeling guidelines; 10) The patient must not be taking another GLP-1 receptor agonist AND the patient must not be taking insulin concurrently.  -encouraged stopping eating added sugars -continue nuts -discussed eating protein  daily -resistance training daily.  -recommended protein sources -continue avoiding bread.  -recommended eating eggs rather than Ensure Max.  -continue shrimp as source of protein. Mercury in salmon may be contributing  to her joint symptoms. Advised to stop sodas and made plan to do so. She did not tolerate topamax well. Discussed gastric bypass surgery and gastric sleeve -prescribed ozempic, discussed its mechanism of action, side effects, increase dose 2mg  -Discussed the benefits of intermittent fasting. Recommended starting with pushing dinner 15 minutes earlier and when this feels easy, continuing to push dinner 15 minutes earlier. Discussed that this can help her body to improve its ability to burn fat rather than glucose, improving insulin sensitivity. Recommended drinking Roobois tea in the evening to help curb appetite and for its numerous health benefits.   -discussed we can consider increasing metformin if she does not tolerate ozempic  6. Insomnia: -Try to go outside near sunrise -Get exercise during the day.  -Turn off all devices an hour before bedtime.  -Teas that can benefit: chamomile, valerian root, Brahmi (Bacopa) -Can consider over the counter melatonin, magnesium, and/or L-theanine. Melatonin is an anti-oxidant with multiple health benefits. Magnesium is involved in greater than 300 enzymatic reactions in the body and most of [place] are deficient as our soil is often depleted. There are 7 different types of magnesium- Bioptemizer's is a supplement with all 7 types, and each has unique benefits. Magnesium can also help with constipation and anxiety.  -Pistachios naturally increase the production of melatonin -Cozy Earth bamboo bed sheets are free from toxic chemicals.  -Tart cherry juice or a tart cherry supplement can improve sleep and soreness post-workout   7. Migraines:  -continue migranol for her headaches- this has worked for her in the past and she has failed multiple other  medications (listed above)  8) Vitamin D deficiency: Continue ergocalciferol 50,000U once per week for 14 weeks.   9) HTN: -sent amlodipine instead of Lisinopril because latter is causing her to cough -asked nursing to recheck BP -discussed with patient that may be elevated because she did not take her BP medication this morning

## 2023-03-29 NOTE — Addendum Note (Signed)
Addended by: Silas Sacramento T on: 03/29/2023 11:39 AM   Modules accepted: Orders

## 2023-04-11 ENCOUNTER — Encounter: Payer: 59 | Admitting: Registered Nurse

## 2023-04-12 ENCOUNTER — Encounter: Payer: Self-pay | Admitting: Registered Nurse

## 2023-04-12 ENCOUNTER — Encounter (HOSPITAL_BASED_OUTPATIENT_CLINIC_OR_DEPARTMENT_OTHER): Payer: 59 | Admitting: Registered Nurse

## 2023-04-12 VITALS — BP 126/83 | HR 55 | Ht 65.0 in | Wt 248.2 lb

## 2023-04-12 DIAGNOSIS — Z5181 Encounter for therapeutic drug level monitoring: Secondary | ICD-10-CM | POA: Diagnosis not present

## 2023-04-12 DIAGNOSIS — G894 Chronic pain syndrome: Secondary | ICD-10-CM

## 2023-04-12 DIAGNOSIS — M25562 Pain in left knee: Secondary | ICD-10-CM

## 2023-04-12 DIAGNOSIS — Z79891 Long term (current) use of opiate analgesic: Secondary | ICD-10-CM

## 2023-04-12 DIAGNOSIS — G8929 Other chronic pain: Secondary | ICD-10-CM

## 2023-04-12 DIAGNOSIS — M25561 Pain in right knee: Secondary | ICD-10-CM

## 2023-04-12 MED ORDER — HYDROCODONE-ACETAMINOPHEN 10-325 MG PO TABS: 1.0000 | ORAL_TABLET | Freq: Four times a day (QID) | ORAL | 0 refills | Status: DC | PRN

## 2023-04-12 NOTE — Progress Notes (Signed)
Subjective:    Patient ID: Kelsey Santana, female    DOB: October 13, 1960, 62 y.o.   MRN: [id number]  HPI: Kelsey Santana is a 62 y.o. female who returns for follow up appointment for chronic pain and medication refill. She states her pain is located in her bilateral knees R>L. She rates her pain 7. Her current exercise regime is walking and performing stretching exercises.   Kelsey Santana Morphine equivalent is 40.00 MME.   Last UDS was Performed on 03/20/2023, it was consistent.  Pain Inventory Average Pain 10 Pain Right Now 7 My pain is sharp, stabbing, and aching  In the last 24 hours, has pain interfered with the following? General activity 5 Relation with others 6 Enjoyment of life 3 What TIME of day is your pain at its worst? morning , daytime, evening, and night Sleep (in general) Poor  Pain is worse with: walking, bending, sitting, inactivity, standing, and some activites Pain improves with: medication and injections Relief from Meds: 9  Family History  Problem Relation Age of Onset   Alcohol abuse Mother    Diabetes Mother    Hyperlipidemia Mother    Hypertension Mother    Diabetes Brother    Hyperlipidemia Brother    Hypertension Brother    Thyroid disease Neg Hx    Social History   Socioeconomic History   Marital status: Single    Spouse name: Not on file   Number of children: Not on file   Years of education: Not on file   Highest education level: Not on file  Occupational History   Not on file  Tobacco Use   Smoking status: Former   Smokeless tobacco: Former   Tobacco comments:    quit 28 yrs ago  Vaping Use   Vaping status: Never Used  Substance and Sexual Activity   Alcohol use: No   Drug use: No   Sexual activity: Never  Other Topics Concern   Not on file  Social History Narrative   epworth sleepiness scale = 10 (11/15/15)    Lives alone in a 2 story home.  Has 3 children.  Currently not working.  Has lost 6 jobs in the past 2 months due to  her leg numbness and pain.     Education: 2 years of college.   Social Determinants of Health   Financial Resource Strain: Not on file  Food Insecurity: Not on file  Transportation Needs: Not on file  Physical Activity: Not on file  Stress: Not on file  Social Connections: Unknown (01/02/2022)   [facility]   Social Network    Social Network: Not on file   Past Surgical History:  Procedure Laterality Date   ABDOMINAL HYSTERECTOMY     complete   COLONOSCOPY N/A 01/16/2017   Procedure: COLONOSCOPY;  Surgeon: Jeani Hawking, MD;  Location: [facility];  Service: Endoscopy;  Laterality: N/A;   ESOPHAGOGASTRODUODENOSCOPY N/A 01/16/2017   Procedure: ESOPHAGOGASTRODUODENOSCOPY (EGD);  Surgeon: Jeani Hawking, MD;  Location: [facility];  Service: Endoscopy;  Laterality: N/A;   Past Surgical History:  Procedure Laterality Date   ABDOMINAL HYSTERECTOMY     complete   COLONOSCOPY N/A 01/16/2017   Procedure: COLONOSCOPY;  Surgeon: Jeani Hawking, MD;  Location: [facility];  Service: Endoscopy;  Laterality: N/A;   ESOPHAGOGASTRODUODENOSCOPY N/A 01/16/2017   Procedure: ESOPHAGOGASTRODUODENOSCOPY (EGD);  Surgeon: Jeani Hawking, MD;  Location: [facility];  Service: Endoscopy;  Laterality: N/A;   Past Medical History:  Diagnosis  Date   Back pain    l 4 and l5 djd   GERD (gastroesophageal reflux disease)    Hypercholesteremia    Hypertension    Migraine    Pre-diabetes    BP 126/83   Pulse (!) 55 Comment: Apical  Ht 5\' 5"  (1.651 m)   Wt 248 lb 3.2 oz (112.6 kg)   SpO2 96%   BMI 41.30 kg/m   Opioid Risk Score:   Fall Risk Score:  `1  Depression screen [facility] 2/9     04/12/2023   11:13 AM 03/20/2023    9:40 AM 12/21/2022    8:28 AM 10/26/2022    9:50 AM 09/22/2022    9:34 AM 08/22/2022    2:08 PM 07/12/2022   10:16 AM  Depression screen PHQ 2/9  Decreased Interest 1 1 0 1 0 1 0  Down, Depressed, Hopeless 1 1 0 1 0 1 0  PHQ - 2 Score 2 2 0 2 0 2 0      Review of Systems  Constitutional: Negative.   HENT: Negative.    Eyes: Negative.   Respiratory: Negative.    Cardiovascular: Negative.   Gastrointestinal: Negative.   Endocrine: Negative.   Genitourinary: Negative.   Musculoskeletal:        Bilateral knee pain  Skin: Negative.   Allergic/Immunologic: Negative.   Neurological: Negative.   Hematological: Negative.   Psychiatric/Behavioral:  Positive for dysphoric mood.   All other systems reviewed and are negative.      Objective:   Physical Exam Vitals and nursing note reviewed.  Constitutional:      Appearance: Normal appearance. She is obese.  Cardiovascular:     Rate and Rhythm: Normal rate and regular rhythm.  Pulmonary:     Effort: Pulmonary effort is normal.     Breath sounds: Normal breath sounds.  Musculoskeletal:     Cervical back: Normal range of motion and neck supple.     Comments: Normal Muscle Bulk and Muscle Testing Reveals:  Upper Extremities: Full ROM and Muscle Strength 5/5 Lower Extremities: Full ROM and Muscle Strength 5/5 Bilateral Lower Extremity Flexion Produces Pain into her bilateral Hips  Arises from Table with Ease Narrow Based  Gait     Skin:    General: Skin is warm and dry.  Neurological:     Mental Status: She is alert and oriented to person, place, and time.  Psychiatric:        Mood and Affect: Mood normal.        Behavior: Behavior normal.         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 Continue to monitor. 04/12/2023 Bilateral Greater Trochanter Tenderness: Continue to Alternate Ice and Heat Therapy. Continue to Monitor. 04/12/2023 Bilateral Primary Osteoarthritis of Bilateral Knees: S/P Synvisc Injection with Dr Carlis Abbott on 03/20/2023 .Continue  HEP as Tolerated . Continue current medication regimen as prescribed. Continue to Monitor. 04/12/2023 Chronic Pain Syndrome: Refilled:  Hydrocodone 10mg /325 one tablet 4 times a day as needed for pain. #120.  We will continue the opioid monitoring program, this consists of regular clinic visits, examinations, urine drug screen, pill counts as well as use of [place] Controlled Substance Reporting system. A 12 month History has been reviewed on the [place] Controlled Substance Reporting System on 04/12/2023 5.Morbid Obesity: Continue Healthy Diet Regimen and Continue with HEP as Tolerated. Marland Kitchen 04/12/2023   F/U in 1 month

## 2023-04-18 LAB — COMPREHENSIVE METABOLIC PANEL: EGFR: 76

## 2023-04-19 LAB — HM HEPATITIS C SCREENING LAB: HM Hepatitis Screen: NEGATIVE

## 2023-04-19 LAB — T4, FREE: Free T4: 1.11 ng/dL

## 2023-04-19 LAB — TSH: TSH: 0.95 (ref 0.41–5.90)

## 2023-04-19 LAB — HEMOGLOBIN A1C: A1c: 5.6

## 2023-05-08 LAB — HM COLONOSCOPY

## 2023-05-11 ENCOUNTER — Encounter: Payer: Self-pay | Admitting: Registered Nurse

## 2023-05-11 ENCOUNTER — Encounter: Payer: 59 | Attending: Registered Nurse | Admitting: Registered Nurse

## 2023-05-11 VITALS — BP 175/108 | HR 59 | Ht 65.0 in | Wt 251.4 lb

## 2023-05-11 DIAGNOSIS — M25561 Pain in right knee: Secondary | ICD-10-CM | POA: Insufficient documentation

## 2023-05-11 DIAGNOSIS — M545 Low back pain, unspecified: Secondary | ICD-10-CM

## 2023-05-11 DIAGNOSIS — M25562 Pain in left knee: Secondary | ICD-10-CM | POA: Diagnosis not present

## 2023-05-11 DIAGNOSIS — M7061 Trochanteric bursitis, right hip: Secondary | ICD-10-CM

## 2023-05-11 DIAGNOSIS — G894 Chronic pain syndrome: Secondary | ICD-10-CM | POA: Diagnosis not present

## 2023-05-11 DIAGNOSIS — G8929 Other chronic pain: Secondary | ICD-10-CM | POA: Diagnosis present

## 2023-05-11 DIAGNOSIS — Z5181 Encounter for therapeutic drug level monitoring: Secondary | ICD-10-CM

## 2023-05-11 DIAGNOSIS — M7062 Trochanteric bursitis, left hip: Secondary | ICD-10-CM | POA: Diagnosis not present

## 2023-05-11 DIAGNOSIS — M170 Bilateral primary osteoarthritis of knee: Secondary | ICD-10-CM | POA: Diagnosis not present

## 2023-05-11 DIAGNOSIS — M5416 Radiculopathy, lumbar region: Secondary | ICD-10-CM | POA: Diagnosis not present

## 2023-05-11 DIAGNOSIS — Z79891 Long term (current) use of opiate analgesic: Secondary | ICD-10-CM | POA: Diagnosis not present

## 2023-05-11 DIAGNOSIS — I10 Essential (primary) hypertension: Secondary | ICD-10-CM | POA: Diagnosis not present

## 2023-05-11 MED ORDER — HYDROCODONE-ACETAMINOPHEN 10-325 MG PO TABS: 1.0000 | ORAL_TABLET | Freq: Four times a day (QID) | ORAL | 0 refills | Status: DC | PRN

## 2023-05-11 NOTE — Progress Notes (Signed)
Subjective:    Patient ID: Kelsey Santana, female    DOB: [birth year], 62 y.o.   MRN: [id number]  HPI: Kelsey Santana is a 62 y.o. female who returns for follow up appointment for chronic pain and medication refill. She states her pain is located in her lower back, bilateral hips and bilateral knee pain. She rates her pain 5. Her current exercise regime is walking and performing stretching exercises.  Ms. Bro arrive to office with uncontrolled hypertension, she states her PCP made changes to her anti-hypertensive medication. She refuses ED or Urgent Care evaluation, blood pressure was re-checked. Ms. Laack  states she will call her PCP today, she was encouraged to call her PCP immediately, she verbalizes understanding.   Ms. Kneipp Morphine equivalent is 40.00 MME.   Last UDS was Performed on 03/20/2023, it was consistent.    Pain Inventory Average Pain 10 Pain Right Now 5 My pain is sharp, stabbing, tingling, and aching  In the last 24 hours, has pain interfered with the following? General activity 5 Relation with others 4 Enjoyment of life 4 What TIME of day is your pain at its worst? varies Sleep (in general) Fair  Pain is worse with: walking, bending, sitting, inactivity, standing, and some activites Pain improves with: rest, medication, and injections Relief from Meds: 8  Family History  Problem Relation Age of Onset   Alcohol abuse Mother    Diabetes Mother    Hyperlipidemia Mother    Hypertension Mother    Diabetes Brother    Hyperlipidemia Brother    Hypertension Brother    Thyroid disease Neg Hx    Social History   Socioeconomic History   Marital status: Single    Spouse name: Not on file   Number of children: Not on file   Years of education: Not on file   Highest education level: Not on file  Occupational History   Not on file  Tobacco Use   Smoking status: Former   Smokeless tobacco: Former   Tobacco comments:    quit 28 yrs ago  Vaping Use    Vaping status: Never Used  Substance and Sexual Activity   Alcohol use: No   Drug use: No   Sexual activity: Never  Other Topics Concern   Not on file  Social History Narrative   epworth sleepiness scale = 10 (11/15/15)    Lives alone in a 2 story home.  Has 3 children.  Currently not working.  Has lost 6 jobs in the past 2 months due to her leg numbness and pain.     Education: 2 years of college.   Social Determinants of Health   Financial Resource Strain: Not on file  Food Insecurity: Not on file  Transportation Needs: Not on file  Physical Activity: Not on file  Stress: Not on file  Social Connections: Unknown (01/02/2022)   Received from [facility], [facility]   Social Network    Social Network: Not on file   Past Surgical History:  Procedure Laterality Date   ABDOMINAL HYSTERECTOMY     complete   COLONOSCOPY N/A 01/16/2017   Procedure: COLONOSCOPY;  Surgeon: Jeani Hawking, MD;  Location: [facility];  Service: Endoscopy;  Laterality: N/A;   ESOPHAGOGASTRODUODENOSCOPY N/A 01/16/2017   Procedure: ESOPHAGOGASTRODUODENOSCOPY (EGD);  Surgeon: Jeani Hawking, MD;  Location: [facility];  Service: Endoscopy;  Laterality: N/A;   Past Surgical History:  Procedure Laterality Date   ABDOMINAL HYSTERECTOMY     complete  COLONOSCOPY N/A 01/16/2017   Procedure: COLONOSCOPY;  Surgeon: Jeani Hawking, MD;  Location: [facility];  Service: Endoscopy;  Laterality: N/A;   ESOPHAGOGASTRODUODENOSCOPY N/A 01/16/2017   Procedure: ESOPHAGOGASTRODUODENOSCOPY (EGD);  Surgeon: Jeani Hawking, MD;  Location: [facility];  Service: Endoscopy;  Laterality: N/A;   Past Medical History:  Diagnosis Date   Back pain    l 4 and l5 djd   GERD (gastroesophageal reflux disease)    Hypercholesteremia    Hypertension    Migraine    Pre-diabetes    BP (!) 182/106   Pulse (!) 57   Ht 5\' 5"  (1.651 m)   Wt 251 lb 6.4 oz (114 kg)   SpO2 100%   BMI 41.84 kg/m   Opioid Risk Score:   Fall Risk  Score:  `1  Depression screen [facility] 2/9     05/11/2023    9:14 AM 04/12/2023   11:13 AM 03/20/2023    9:40 AM 12/21/2022    8:28 AM 10/26/2022    9:50 AM 09/22/2022    9:34 AM 08/22/2022    2:08 PM  Depression screen PHQ 2/9  Decreased Interest 0 1 1 0 1 0 1  Down, Depressed, Hopeless 0 1 1 0 1 0 1  PHQ - 2 Score 0 2 2 0 2 0 2      Review of Systems  Musculoskeletal:  Positive for back pain and gait problem.       B/L hip, knee, thigh pain   All other systems reviewed and are negative.     Objective:   Physical Exam Vitals and nursing note reviewed.  Constitutional:      Appearance: Normal appearance. She is obese.  Neck:     Comments: Cervical Paraspinal Tenderness: C-5-C-6 Cardiovascular:     Rate and Rhythm: Bradycardia present.     Pulses: Normal pulses.     Heart sounds: Normal heart sounds.  Pulmonary:     Effort: Pulmonary effort is normal.     Breath sounds: Normal breath sounds.  Musculoskeletal:     Cervical back: Normal range of motion and neck supple.     Comments: Normal Muscle Bulk and Muscle Testing Reveals:  Upper Extremities: Full ROM and Muscle Strength 5/5 Lumbar Paraspinal Tenderness: L-4-L-5 Bilateral Greater Trochanter Tenderness Lower Extremities: Decreased ROM and Muscle Strength 5/5 Right Lower Extremity Flexion Produces Pain into her Lumbar Left Lower Extremity Flexion Produces Pain into her Lumbar and Left Hip Arises from Table slowly Narrow Based  Gait     Skin:    General: Skin is warm and dry.  Neurological:     Mental Status: She is alert and oriented to person, place, and time.  Psychiatric:        Mood and Affect: Mood normal.        Behavior: Behavior normal.         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 Continue to monitor. 05/11/2023 Bilateral Greater Trochanter Tenderness: Continue to Alternate Ice and Heat Therapy. Continue to  Monitor. 05/11/2023 Bilateral Primary Osteoarthritis of Bilateral Knees: S/P Synvisc Injection with Dr Carlis Abbott on 03/20/2023 .Continue  HEP as Tolerated . Continue current medication regimen as prescribed. Continue to Monitor. 05/11/2023 Chronic Pain Syndrome: Refilled: Hydrocodone 10mg /325 one tablet 4 times a day as needed for pain. #120.  We will continue the opioid monitoring program, this consists of regular clinic visits, examinations, urine drug screen, pill counts as  well as use of [place] Controlled Substance Reporting system. A 12 month History has been reviewed on the [place] Controlled Substance Reporting System on 05/11/2023 5.Morbid Obesity: Continue Healthy Diet Regimen and Continue with HEP as Tolerated. Marland Kitchen 05/11/2023 6. Bradycardia: Apical Pulse checked. Ms. Eggleston will F/U with her PCP. She verbalizes understanding.  7. Uncontrolled Hypertension: Ms. Vaske states she is compliant with her anti-hypertensive medication, her PCP recently made changes to her medication. She refuses ED or Urgent Care evaluation. She states she will call her PCP today, she was encouraged to call her PCP today, she verbalizes understanding.      F/U in 1 month

## 2023-05-18 ENCOUNTER — Other Ambulatory Visit: Payer: Self-pay | Admitting: Physical Medicine and Rehabilitation

## 2023-05-23 LAB — T4, FREE: Free T4: 1 ng/dL

## 2023-05-23 LAB — TSH: TSH: 1.03 (ref 0.41–5.90)

## 2023-06-05 ENCOUNTER — Encounter: Payer: 59 | Attending: Physical Medicine and Rehabilitation | Admitting: Registered Nurse

## 2023-06-05 ENCOUNTER — Encounter: Payer: Self-pay | Admitting: Registered Nurse

## 2023-06-05 VITALS — BP 137/85 | HR 56 | Ht 65.0 in | Wt 256.2 lb

## 2023-06-05 DIAGNOSIS — M7061 Trochanteric bursitis, right hip: Secondary | ICD-10-CM | POA: Diagnosis not present

## 2023-06-05 DIAGNOSIS — M545 Low back pain, unspecified: Secondary | ICD-10-CM | POA: Insufficient documentation

## 2023-06-05 DIAGNOSIS — M25561 Pain in right knee: Secondary | ICD-10-CM | POA: Diagnosis present

## 2023-06-05 DIAGNOSIS — M25562 Pain in left knee: Secondary | ICD-10-CM | POA: Diagnosis not present

## 2023-06-05 DIAGNOSIS — M7062 Trochanteric bursitis, left hip: Secondary | ICD-10-CM | POA: Insufficient documentation

## 2023-06-05 DIAGNOSIS — G8929 Other chronic pain: Secondary | ICD-10-CM

## 2023-06-05 DIAGNOSIS — G894 Chronic pain syndrome: Secondary | ICD-10-CM | POA: Diagnosis not present

## 2023-06-05 DIAGNOSIS — Z79891 Long term (current) use of opiate analgesic: Secondary | ICD-10-CM | POA: Insufficient documentation

## 2023-06-05 DIAGNOSIS — Z5181 Encounter for therapeutic drug level monitoring: Secondary | ICD-10-CM | POA: Insufficient documentation

## 2023-06-05 MED ORDER — HYDROCODONE-ACETAMINOPHEN 10-325 MG PO TABS: 1.0000 | ORAL_TABLET | Freq: Four times a day (QID) | ORAL | 0 refills | Status: DC | PRN

## 2023-06-05 NOTE — Progress Notes (Signed)
Subjective:    Patient ID: Kelsey Santana, female    DOB: October 20, 1960, 62 y.o.   MRN: [id number]  HPI: Kelsey Santana is a 62 y.o. female who returns for follow up appointment for chronic pain and medication refill. She states her pain is located in her lower back, bilateral hips and bilateral knees. She also reports she has a headache today, onset now, she reports she will take some Tylenol when she goes home. .She rates her pain 7. Her current exercise regime is walking and performing stretching exercises.  Kelsey Santana Morphine equivalent is 40.00 MME.   Last UDS was Performed on 03/20/2023, it was consistent.     Pain Inventory Average Pain 10 Pain Right Now 7 My pain is constant, sharp, burning, stabbing, tingling, and aching  In the last 24 hours, has pain interfered with the following? General activity 4 Relation with others 4 Enjoyment of life 4 What TIME of day is your pain at its worst? morning , daytime, evening, and night Sleep (in general) Poor  Pain is worse with: walking, bending, sitting, inactivity, standing, and some activites Pain improves with: medication, TENS, and injections Relief from Meds: 9  Family History  Problem Relation Age of Onset   Alcohol abuse Mother    Diabetes Mother    Hyperlipidemia Mother    Hypertension Mother    Diabetes Brother    Hyperlipidemia Brother    Hypertension Brother    Thyroid disease Neg Hx    Social History   Socioeconomic History   Marital status: Single    Spouse name: Not on file   Number of children: Not on file   Years of education: Not on file   Highest education level: Not on file  Occupational History   Not on file  Tobacco Use   Smoking status: Former   Smokeless tobacco: Former   Tobacco comments:    quit 28 yrs ago  Vaping Use   Vaping status: Never Used  Substance and Sexual Activity   Alcohol use: No   Drug use: No   Sexual activity: Never  Other Topics Concern   Not on file  Social  History Narrative   epworth sleepiness scale = 10 (11/15/15)    Lives alone in a 2 story home.  Has 3 children.  Currently not working.  Has lost 6 jobs in the past 2 months due to her leg numbness and pain.     Education: 2 years of college.   Social Determinants of Health   Financial Resource Strain: Not on file  Food Insecurity: Not on file  Transportation Needs: Not on file  Physical Activity: Not on file  Stress: Not on file  Social Connections: Unknown (01/02/2022)   Received from [facility], [facility]   Social Network    Social Network: Not on file   Past Surgical History:  Procedure Laterality Date   ABDOMINAL HYSTERECTOMY     complete   COLONOSCOPY N/A 01/16/2017   Procedure: COLONOSCOPY;  Surgeon: Jeani Hawking, MD;  Location: [facility];  Service: Endoscopy;  Laterality: N/A;   ESOPHAGOGASTRODUODENOSCOPY N/A 01/16/2017   Procedure: ESOPHAGOGASTRODUODENOSCOPY (EGD);  Surgeon: Jeani Hawking, MD;  Location: [facility];  Service: Endoscopy;  Laterality: N/A;   Past Surgical History:  Procedure Laterality Date   ABDOMINAL HYSTERECTOMY     complete   COLONOSCOPY N/A 01/16/2017   Procedure: COLONOSCOPY;  Surgeon: Jeani Hawking, MD;  Location: [facility];  Service: Endoscopy;  Laterality: N/A;  ESOPHAGOGASTRODUODENOSCOPY N/A 01/16/2017   Procedure: ESOPHAGOGASTRODUODENOSCOPY (EGD);  Surgeon: Jeani Hawking, MD;  Location: [facility];  Service: Endoscopy;  Laterality: N/A;   Past Medical History:  Diagnosis Date   Back pain    l 4 and l5 djd   GERD (gastroesophageal reflux disease)    Hypercholesteremia    Hypertension    Migraine    Pre-diabetes    BP (!) 149/99   Pulse (!) 56   Ht 5\' 5"  (1.651 m)   Wt 256 lb 3.2 oz (116.2 kg)   SpO2 96%   BMI 42.63 kg/m   Opioid Risk Score:   Fall Risk Score:  `1  Depression screen [facility] 2/9     06/05/2023    9:24 AM 05/11/2023    9:14 AM 04/12/2023   11:13 AM 03/20/2023    9:40 AM 12/21/2022    8:28 AM 10/26/2022     9:50 AM 09/22/2022    9:34 AM  Depression screen PHQ 2/9  Decreased Interest 0 0 1 1 0 1 0  Down, Depressed, Hopeless 0 0 1 1 0 1 0  PHQ - 2 Score 0 0 2 2 0 2 0     Review of Systems  Constitutional: Negative.   HENT: Negative.    Eyes: Negative.   Respiratory: Negative.    Cardiovascular: Negative.   Gastrointestinal: Negative.   Endocrine: Negative.   Genitourinary: Negative.   Musculoskeletal:  Positive for back pain and gait problem.       Bil hip and knees  Skin: Negative.   Allergic/Immunologic: Negative.   Hematological: Negative.   Psychiatric/Behavioral: Negative.    All other systems reviewed and are negative.      Objective:   Physical Exam Vitals and nursing note reviewed.  Constitutional:      Appearance: Normal appearance. She is obese.  Neck:     Comments: Cervical Paraspinal Tenderness: C-5-C-6 Cardiovascular:     Rate and Rhythm: Normal rate and regular rhythm.     Pulses: Normal pulses.     Heart sounds: Normal heart sounds.  Pulmonary:     Effort: Pulmonary effort is normal.     Breath sounds: Normal breath sounds.  Musculoskeletal:     Cervical back: Normal range of motion and neck supple.     Comments: Normal Muscle Bulk and Muscle Testing Reveals:  Upper Extremities: Full ROM and Muscle Strength 5/5 Bilateral AC Joint Tenderness Lumbar Paraspinal Tenderness: L-4-L-5 Lower Extremities: Decreased  ROM and Muscle Strength 5/5 Bilateral Lower Extremities Flexion Produces Pain into her Lumbar, Bilateral Hips and Bilateral Patella's Arises from Table slowly Narrow Based   Skin:    General: Skin is warm and dry.  Neurological:     Mental Status: She is alert and oriented to person, place, and time.  Psychiatric:        Mood and Affect: Mood normal.        Behavior: Behavior normal.         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 Continue to  monitor. 06/05/2023 Bilateral Greater Trochanter Tenderness: Schedule an appointment with Dr Carlis Abbott for Bilateral Hip Injection Continue to Alternate Ice and Heat Therapy. Continue to Monitor. 06/05/2023 Bilateral Primary Osteoarthritis of Bilateral Knees: S/P Synvisc Injection with Dr Carlis Abbott on 03/20/2023, with good relief noted .Schedule for Bilateral Synvisc injection with Dr Marijean Niemann. Continue  HEP as Tolerated . Continue current medication regimen as prescribed. Continue to  Monitor. 06/05/2023 Chronic Pain Syndrome: Refilled: Hydrocodone 10mg /325 one tablet 4 times a day as needed for pain. #120.  We will continue the opioid monitoring program, this consists of regular clinic visits, examinations, urine drug screen, pill counts as well as use of [place] Controlled Substance Reporting system. A 12 month History has been reviewed on the [place] Controlled Substance Reporting System on 06/05/2023 5.Morbid Obesity: Continue Healthy Diet Regimen and Continue with HEP as Tolerated. Marland KitchenPCP Following.  06/05/2023 6. Bradycardia: Apical Pulse checked. Kelsey Santana will F/U with her PCP. She verbalizes understanding.    F/U in 1 month

## 2023-06-14 ENCOUNTER — Encounter: Payer: Self-pay | Admitting: Physical Medicine and Rehabilitation

## 2023-06-14 ENCOUNTER — Encounter: Payer: 59 | Admitting: Physical Medicine and Rehabilitation

## 2023-06-14 VITALS — BP 110/76 | HR 60 | Ht 65.0 in | Wt 255.0 lb

## 2023-06-14 DIAGNOSIS — M719 Bursopathy, unspecified: Secondary | ICD-10-CM

## 2023-06-14 DIAGNOSIS — M545 Low back pain, unspecified: Secondary | ICD-10-CM | POA: Diagnosis not present

## 2023-06-14 MED ORDER — LIDOCAINE HCL 1 % IJ SOLN
8.0000 mL | Freq: Once | INTRAMUSCULAR | Status: AC
Start: 2023-06-14 — End: 2023-06-14
  Administered 2023-06-14: 8 mL

## 2023-06-14 MED ORDER — HYDROCODONE-ACETAMINOPHEN 10-325 MG PO TABS: 1.0000 | ORAL_TABLET | Freq: Four times a day (QID) | ORAL | 0 refills | Status: DC | PRN

## 2023-06-14 MED ORDER — BETAMETHASONE SOD PHOS & ACET 6 (3-3) MG/ML IJ SUSP
12.0000 mg | Freq: Once | INTRAMUSCULAR | Status: AC
Start: 2023-06-14 — End: 2023-06-14
  Administered 2023-06-14: 12 mg via INTRA_ARTICULAR

## 2023-06-14 NOTE — Progress Notes (Signed)
Trochanteric bursa injection, b/l With or without ultrasound guidance  Indication Trochanteric bursitis. Exam has tenderness over the greater trochanter of the hip. Pain has not responded to conservative care such as exercise therapy and oral medications. Pain interferes with sleep or with mobility Informed consent was obtained after describing risks and benefits of the procedure with the patient these include bleeding bruising and infection. Patient has signed written consent form. Patient placed in a lateral decubitus position with the affected hip superior. Point of maximal pain was palpated marked and prepped with Betadine and entered with a needle to bone contact. Needle slightly withdrawn then 6mg  of betamethasone with 4 cc 1% lidocaine were injected. Patient tolerated procedure well. Post procedure instructions given.

## 2023-06-26 ENCOUNTER — Encounter: Payer: 59 | Admitting: Family Medicine

## 2023-07-05 ENCOUNTER — Encounter: Payer: 59 | Attending: Physical Medicine and Rehabilitation | Admitting: Physical Medicine and Rehabilitation

## 2023-07-05 ENCOUNTER — Encounter: Payer: Self-pay | Admitting: Physical Medicine and Rehabilitation

## 2023-07-05 VITALS — BP 146/84 | HR 56 | Ht 65.0 in | Wt 260.6 lb

## 2023-07-05 DIAGNOSIS — M170 Bilateral primary osteoarthritis of knee: Secondary | ICD-10-CM | POA: Insufficient documentation

## 2023-07-05 MED ORDER — HYDROCODONE-ACETAMINOPHEN 10-325 MG PO TABS: 1.0000 | ORAL_TABLET | Freq: Four times a day (QID) | ORAL | 0 refills | Status: DC | PRN

## 2023-07-05 MED ORDER — TOPIRAMATE 25 MG PO TABS: 25.0000 mg | ORAL_TABLET | Freq: Two times a day (BID) | ORAL | 3 refills | Status: DC

## 2023-07-05 MED ORDER — HYLAN G-F 20 48 MG/6ML IX SOSY
6.0000 mL | PREFILLED_SYRINGE | Freq: Once | INTRA_ARTICULAR | Status: AC
  Administered 2023-07-05: 48 mg via INTRA_ARTICULAR

## 2023-07-05 NOTE — Progress Notes (Addendum)
Knee injection, bilateral  Indication: Bilateral knee pain not relieved by medication management and other conservative care.  Informed consent was obtained after describing risks and benefits of the procedure with the patient, this includes bleeding, bruising, infection and medication side effects. The patient wishes to proceed and has given written consent. The patient was placed in a recumbent position. The medial aspect of the knee was marked and prepped with Betadine and alcohol. It was then entered with a 25-gauge 1-1/2 inch needle and 1 mL of 1% lidocaine was injected into the knee joint. After negative draw back for blood, a solution containing Synvisc was injected. This was repeated on the other side. The patient tolerated the procedure well. Post procedure instructions were given.   Prescribing Home Zynex NexWave Stimulator Device and supplies as needed. IFC, NMES and TENS medically necessary Treatment Rx: Daily @ 30-40 minutes per treatment PRN. Zynex NexWave only, no substitutions. Treatment Goals: 1) To reduce and/or eliminate pain 2) To improve functional capacity and Activities of daily living 3) To reduce or prevent the need for oral medications 4) To improve circulation in the injured region 5) To decrease or prevent muscle spasm and muscle atrophy 6) To provide a self-management tool to the patient The patient has not sufficiently improved with conservative care. Numerous studies indexed by Medline and PubMed.gov have shown Neuromuscular, Interferential, and TENS stimulators to reduce pain, improve function, and reduce medication use in injured patients. Continued use of this evidence based, safe, drug free treatment is both reasonable and medically necessary at this time.

## 2023-07-05 NOTE — Patient Instructions (Signed)
Epsom salts  Magnesium glycinate 250mg  HS

## 2023-07-25 ENCOUNTER — Ambulatory Visit: Payer: Self-pay | Admitting: Surgery

## 2023-07-25 NOTE — H&P (Signed)
Subjective   Chief Complaint: New Consultation     History of Present Illness: Kelsey Santana is a 62 y.o. female who is seen today as an office consultation at the request of Dr. Gwendalyn Ege for evaluation of New Consultation .   This is a 62 year old female who presents with a 37-month history of intermittent upper abdominal pain associated with nausea, vomiting, and diarrhea.  This tends to occur after eating, especially fatty foods.  The patient underwent a very thorough evaluation including colonoscopy and EGD.  These did not show any findings that would explain her symptoms.  She had an ultrasound that showed multiple gallstones with no sign of wall thickening.  The patient continues to have intermittent symptoms although these have improved since limiting the amount of fat in her diet.  The patient is on Ozempic.   Review of Systems: A complete review of systems was obtained from the patient.  I have reviewed this information and discussed as appropriate with the patient.  See HPI as well for other ROS.  Review of Systems  Constitutional: Negative.   HENT: Negative.    Eyes: Negative.   Respiratory: Negative.    Cardiovascular: Negative.   Gastrointestinal:  Positive for abdominal pain, diarrhea, nausea and vomiting.  Genitourinary: Negative.   Musculoskeletal:  Positive for back pain and joint pain.  Skin: Negative.   Neurological:  Positive for headaches.  Endo/Heme/Allergies: Negative.   Psychiatric/Behavioral: Negative.        Medical History: Past Medical History:  Diagnosis Date   GERD (gastroesophageal reflux disease)    Hyperlipidemia    Hypertension     Patient Active Problem List  Diagnosis   Adenomatous polyp of colon   Depression   Dysphagia   Epigastric pain   Gastro-esophageal reflux disease without esophagitis   History of hysterectomy for benign disease   HLD (hyperlipidemia)   Essential (primary) hypertension   Low back pain   Migraine headache    Morbid obesity due to excess calories (CMS/HHS-HCC)   Vitamin D deficiency    Past Surgical History:  Procedure Laterality Date   HYSTERECTOMY       Allergies  Allergen Reactions   Ciprofloxacin Itching and Nausea    itching   Ace Inhibitors Cough    Only some cause coughing   Hydrocortisone Itching and Nausea    Current Outpatient Medications on File Prior to Visit  Medication Sig Dispense Refill   amLODIPine (NORVASC) 5 MG tablet Take 5 mg by mouth once daily     HYDROcodone-acetaminophen (NORCO) 10-325 mg tablet Take 1 tablet by mouth 4 (four) times daily as needed     losartan (COZAAR) 25 MG tablet Take 25 mg by mouth once daily     metFORMIN (GLUCOPHAGE) 500 MG tablet Take 1 tablet by mouth once daily     OZEMPIC 2 mg/dose (8 mg/3 mL) pen injector INJECT 2MG  INTO THE SKIN ONCE A WEEK     topiramate (TOPAMAX) 25 MG tablet Take 25 mg by mouth 2 (two) times daily     No current facility-administered medications on file prior to visit.    Family History  Problem Relation Age of Onset   High blood pressure (Hypertension) Mother    Hyperlipidemia (Elevated cholesterol) Mother    Coronary Artery Disease (Blocked arteries around heart) Mother    Diabetes Mother      Social History   Tobacco Use  Smoking Status Former   Types: Cigarettes  Smokeless Tobacco Never  Social History   Socioeconomic History   Marital status: Single  Tobacco Use   Smoking status: Former    Types: Cigarettes   Smokeless tobacco: Never  Substance and Sexual Activity   Alcohol use: Not Currently   Drug use: Not Currently   Social Drivers of Health    Received from [employer]   Social Network    Objective:    Vitals:   07/25/23 1513  BP: 130/78  Pulse: (!) 120  Temp: 36.8 C (98.3 F)  SpO2: 94%  Weight: (!) 120.7 kg (266 lb)  Height: 162.6 cm (5\' 4" )    Body mass index is 45.66 kg/m.  Physical Exam   Constitutional:  WDWN in NAD, conversant, no obvious  deformities; lying in bed comfortably Eyes:  Pupils equal, round; sclera anicteric; moist conjunctiva; no lid lag HENT:  Oral mucosa moist; good dentition  Neck:  No masses palpated, trachea midline; no thyromegaly Lungs:  CTA bilaterally; normal respiratory effort CV:  Regular rate and rhythm; no murmurs; extremities well-perfused with no edema Abd:  +bowel sounds, soft, obese, mildly tender in the epigastrium and right upper quadrant, no palpable organomegaly; no palpable hernias Musc: Normal gait; no apparent clubbing or cyanosis in extremities Lymphatic:  No palpable cervical or axillary lymphadenopathy Skin:  Warm, dry; no sign of jaundice Psychiatric - alert and oriented x 4; calm mood and affect   Assessment and Plan:  Diagnoses and all orders for this visit:  Calculus of gallbladder with chronic cholecystitis without obstruction  Recommend laparoscopic cholecystectomy with intraoperative cholangiogram.The surgical procedure has been discussed with the patient.  Potential risks, benefits, alternative treatments, and expected outcomes have been explained.  All of the patient's questions at this time have been answered.  The likelihood of reaching the patient's treatment goal is good.  The patient understands the proposed surgical procedure and wishes to proceed.    Lissa Morales, MD  07/25/2023 4:37 PM

## 2023-08-02 ENCOUNTER — Encounter: Payer: 59 | Admitting: Registered Nurse

## 2023-08-07 NOTE — Progress Notes (Signed)
Subjective:  Patient ID: Kelsey Santana, female    DOB: 10-Jan-1961, 62 y.o.   MRN: [id number]  CC: New Patient  HPI:  Kelsey Santana is a very pleasant 62 y.o. female who presents today to establish care.  She reports that her main concerns today include bilateral knee pain and headache today. Gets cortisone injections into bilateral hips and knees every 6 months. She endorses more weight gain recently 313>239>270 possibly due to Ozempic plateauing for her. She is trying to avoid knee surgery.   BP elevated this morning 150/80 since she hasn't taken her medication this morning. Will recheck.   PMHx: Past Medical History:  Diagnosis Date   Anxiety    Arthritis    Back pain    l 4 and l5 djd   GERD (gastroesophageal reflux disease)    Hypercholesteremia    Hypertension    Migraine    Pre-diabetes     Surgical Hx: Past Surgical History:  Procedure Laterality Date   ABDOMINAL HYSTERECTOMY     complete   COLONOSCOPY N/A 01/16/2017   Procedure: COLONOSCOPY;  Surgeon: Jeani Hawking, MD;  Location: [facility];  Service: Endoscopy;  Laterality: N/A;   ESOPHAGOGASTRODUODENOSCOPY N/A 01/16/2017   Procedure: ESOPHAGOGASTRODUODENOSCOPY (EGD);  Surgeon: Jeani Hawking, MD;  Location: [facility];  Service: Endoscopy;  Laterality: N/A;    Family Hx: Family History  Problem Relation Age of Onset   Alcohol abuse Mother    Diabetes Mother    Hyperlipidemia Mother    Hypertension Mother    COPD Mother    Diabetes Brother    Hyperlipidemia Brother    Hypertension Brother    Depression Brother    Stroke Brother    Thyroid disease Neg Hx     Social Hx: Current Social History   Who lives at home: Lives alone Who would speak for you about health care matters: Daughter or Cousin Wille Celeste) Transportation: Drives self  Important Relationships & Pets: Daughter and Cousin Current Stressors: lost son in January 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  EGD: Oct 2024; results - normal Colonoscopy: Oct 2024; results - normal Mammogram: Nov 2024; results - normal Pap test: total hysterectomy done in 2010 Tetanus vaccine: 2020 Shingles vaccine: no  Smoking status reviewed - hasn't smoked in 35 years  Objective:  BP (!) 149/80   Pulse 62   Ht 5\' 5"  (1.651 m)   Wt 270 lb (122.5 kg)   SpO2 100%   BMI 44.93 kg/m  Vitals and nursing note reviewed  General: NAD, pleasant, able to participate in exam HEENT: normocephalic, no scleral icterus or conjunctival pallor, no nasal discharge, moist mucous membranes Neck: supple, non-tender, without lymphadenopathy Cardiac: RRR, no M/R/G Respiratory: CTAB, normal effort, No wheezes, rales or rhonchi Abdomen: Normoactive bowel sounds, non-tender, non-distended Extremities: TTP over bilateral knee joint spaces Skin: warm and dry, no rashes noted Neuro: alert, no obvious focal deficits Psych: Normal affect and mood  Assessment & Plan:   Assessment & Plan Weight loss Currently on highest dose of Ozempic 2 mg and Topiramate 25 mg BID. She has continued to struggle with weight loss due to plateau with Ozempic. Her arthritis doesn't help her exercise regimen due to increased pain.  - Discussed trying water aerobics and dietary modifications to help  improve her weight - Referral to weight loss clinic placed Gastroesophageal reflux disease without esophagitis Refilled Pantoprazole 20 mg daily  Orders Placed This Encounter  Procedures   Amb Ref to Medical Weight Management    Referral Priority:   Routine    Referral Type:   Consultation    Number of Visits Requested:   1   Meds ordered this encounter  Medications   pantoprazole (PROTONIX) 20 MG tablet    Sig: Take 1 tablet (20 mg total)  by mouth daily.    Dispense:  90 tablet    Refill:  3   No follow-ups on file.  Fortunato Curling, DO 08/10/2023, 11:12 AM

## 2023-08-09 ENCOUNTER — Encounter: Payer: 59 | Attending: Physical Medicine and Rehabilitation | Admitting: Registered Nurse

## 2023-08-09 ENCOUNTER — Encounter: Payer: Self-pay | Admitting: Registered Nurse

## 2023-08-09 VITALS — BP 147/73 | HR 61 | Ht 65.0 in | Wt 268.0 lb

## 2023-08-09 DIAGNOSIS — M25561 Pain in right knee: Secondary | ICD-10-CM | POA: Insufficient documentation

## 2023-08-09 DIAGNOSIS — Z760 Encounter for issue of repeat prescription: Secondary | ICD-10-CM | POA: Diagnosis not present

## 2023-08-09 DIAGNOSIS — G894 Chronic pain syndrome: Secondary | ICD-10-CM | POA: Diagnosis present

## 2023-08-09 DIAGNOSIS — M170 Bilateral primary osteoarthritis of knee: Secondary | ICD-10-CM | POA: Insufficient documentation

## 2023-08-09 DIAGNOSIS — M545 Low back pain, unspecified: Secondary | ICD-10-CM | POA: Insufficient documentation

## 2023-08-09 DIAGNOSIS — M7061 Trochanteric bursitis, right hip: Secondary | ICD-10-CM | POA: Diagnosis not present

## 2023-08-09 DIAGNOSIS — G8929 Other chronic pain: Secondary | ICD-10-CM | POA: Diagnosis present

## 2023-08-09 DIAGNOSIS — Z5181 Encounter for therapeutic drug level monitoring: Secondary | ICD-10-CM | POA: Diagnosis not present

## 2023-08-09 DIAGNOSIS — Z79891 Long term (current) use of opiate analgesic: Secondary | ICD-10-CM | POA: Diagnosis not present

## 2023-08-09 DIAGNOSIS — G43009 Migraine without aura, not intractable, without status migrainosus: Secondary | ICD-10-CM | POA: Diagnosis not present

## 2023-08-09 DIAGNOSIS — M25562 Pain in left knee: Secondary | ICD-10-CM | POA: Diagnosis not present

## 2023-08-09 DIAGNOSIS — M7062 Trochanteric bursitis, left hip: Secondary | ICD-10-CM | POA: Insufficient documentation

## 2023-08-09 MED ORDER — HYDROCODONE-ACETAMINOPHEN 10-325 MG PO TABS: 1.0000 | ORAL_TABLET | Freq: Four times a day (QID) | ORAL | 0 refills | Status: DC | PRN

## 2023-08-09 NOTE — Progress Notes (Signed)
Subjective:    Patient ID: Kelsey Santana, female    DOB: 06/20/1961, 62 y.o.   MRN: [id number]  HPI: Kelsey Santana is a 62 y.o. female who returns for follow up appointment for chronic pain and medication refill. She states her pain is located in her lower back, bilateral hips and bilateral knees R>L. She also reports she has a headache onset 08/08/2023, turning into a migraine she reports, she also states she will take her medication when she gets home. She rates her pain 9. Her current exercise regime is walking and performing stretching exercises.  Kelsey Santana Morphine equivalent is 40.00 MME.   UDS was ordered today.    Pain Inventory Average Pain 10 Pain Right Now 9 My pain is constant, sharp, stabbing, tingling, and aching  In the last 24 hours, has pain interfered with the following? General activity 3 Relation with others 3 Enjoyment of life 3 What TIME of day is your pain at its worst? morning , daytime, evening, and night Sleep (in general) Poor  Pain is worse with: walking, bending, sitting, inactivity, standing, and some activites Pain improves with: rest and medication Relief from Meds: 5  Family History  Problem Relation Age of Onset   Alcohol abuse Mother    Diabetes Mother    Hyperlipidemia Mother    Hypertension Mother    Diabetes Brother    Hyperlipidemia Brother    Hypertension Brother    Thyroid disease Neg Hx    Social History   Socioeconomic History   Marital status: Single    Spouse name: Not on file   Number of children: Not on file   Years of education: Not on file   Highest education level: Not on file  Occupational History   Not on file  Tobacco Use   Smoking status: Former   Smokeless tobacco: Former   Tobacco comments:    quit 28 yrs ago  Vaping Use   Vaping status: Never Used  Substance and Sexual Activity   Alcohol use: No   Drug use: No   Sexual activity: Never  Other Topics Concern   Not on file  Social History  Narrative   epworth sleepiness scale = 10 (11/15/15)    Lives alone in a 2 story home.  Has 3 children.  Currently not working.  Has lost 6 jobs in the past 2 months due to her leg numbness and pain.     Education: 2 years of college.   Social Drivers of Corporate investment banker Strain: Not on file  Food Insecurity: Not on file  Transportation Needs: Not on file  Physical Activity: Not on file  Stress: Not on file  Social Connections: Unknown (01/02/2022)   [facility]   Social Network    Social Network: Not on file   Past Surgical History:  Procedure Laterality Date   ABDOMINAL HYSTERECTOMY     complete   COLONOSCOPY N/A 01/16/2017   Procedure: COLONOSCOPY;  Surgeon: Jeani Hawking, MD;  Location: [facility];  Service: Endoscopy;  Laterality: N/A;   ESOPHAGOGASTRODUODENOSCOPY N/A 01/16/2017   Procedure: ESOPHAGOGASTRODUODENOSCOPY (EGD);  Surgeon: Jeani Hawking, MD;  Location: [facility];  Service: Endoscopy;  Laterality: N/A;   Past Surgical History:  Procedure Laterality Date   ABDOMINAL HYSTERECTOMY     complete   COLONOSCOPY N/A 01/16/2017   Procedure: COLONOSCOPY;  Surgeon: Jeani Hawking, MD;  Location: [facility];  Service: Endoscopy;  Laterality: N/A;  ESOPHAGOGASTRODUODENOSCOPY N/A 01/16/2017   Procedure: ESOPHAGOGASTRODUODENOSCOPY (EGD);  Surgeon: Jeani Hawking, MD;  Location: [facility];  Service: Endoscopy;  Laterality: N/A;   Past Medical History:  Diagnosis Date   Back pain    l 4 and l5 djd   GERD (gastroesophageal reflux disease)    Hypercholesteremia    Hypertension    Migraine    Pre-diabetes    BP (!) 151/94   Pulse 61   Ht 5\' 5"  (1.651 m)   Wt 268 lb (121.6 kg)   SpO2 98%   BMI 44.60 kg/m   Opioid Risk Score:   Fall Risk Score:  `1  Depression screen [facility] 2/9     08/09/2023    8:41 AM 06/14/2023    9:51 AM 06/05/2023    9:24 AM 05/11/2023    9:14 AM 04/12/2023   11:13 AM 03/20/2023    9:40 AM 12/21/2022     8:28 AM  Depression screen PHQ 2/9  Decreased Interest 0 1 0 0 1 1 0  Down, Depressed, Hopeless 0 1 0 0 1 1 0  PHQ - 2 Score 0 2 0 0 2 2 0      Review of Systems  Musculoskeletal:  Positive for back pain, gait problem and neck pain.  All other systems reviewed and are negative.     Objective:   Physical Exam Vitals and nursing note reviewed.  Constitutional:      Appearance: Normal appearance.  Neck:     Comments: Cervical Paraspinal Tenderness: C-5-C-6  Cardiovascular:     Rate and Rhythm: Normal rate and regular rhythm.     Pulses: Normal pulses.     Heart sounds: Normal heart sounds.  Pulmonary:     Effort: Pulmonary effort is normal.     Breath sounds: Normal breath sounds.  Musculoskeletal:     Comments: Normal Muscle Bulk and Muscle Testing Reveals:  Upper Extremities: Full ROM and Muscle Strength 5/5 Bilateral AC Joint Tenderness  Lumbar Paraspinal Tenderness: L-3-L-5 Lower Extremities: Decreased ROM and Muscle Strength 5/5 Bilateral Lower Extremity Flexion Produces Pain into her Bilateral Hips and Bilateral Knees Arises from Chair slowly using cane for support Antalgic  Gait     Skin:    General: Skin is warm and dry.  Neurological:     Mental Status: She is alert and oriented to person, place, and time.  Psychiatric:        Mood and Affect: Mood normal.        Behavior: Behavior normal.         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 Continue to monitor. 08/09/2023 Bilateral Greater Trochanter Tenderness:  Continue to Alternate Ice and Heat Therapy. Continue to Monitor. 08/09/2023 Bilateral Primary Osteoarthritis of Bilateral Knees: S/P Synvisc Injection with Dr Carlis Abbott on 03/20/2023, with good relief noted . Continue  HEP as Tolerated . Continue current medication regimen as prescribed. Continue to Monitor. 08/09/2023 Chronic Pain Syndrome: Refilled:  Hydrocodone 10mg /325 one tablet 4 times a day as needed for pain. #120.  We will continue the opioid monitoring program, this consists of regular clinic visits, examinations, urine drug screen, pill counts as well as use of [place] Controlled Substance Reporting system. A 12 month History has been reviewed on the [place] Controlled Substance Reporting System on 08/09/2023 5.Morbid Obesity: Continue Healthy Diet Regimen and Continue with HEP as Tolerated. Marland KitchenPCP Following.  08/09/2023    F/U  in 1 month

## 2023-08-10 ENCOUNTER — Encounter: Payer: Self-pay | Admitting: Family Medicine

## 2023-08-10 ENCOUNTER — Ambulatory Visit (INDEPENDENT_AMBULATORY_CARE_PROVIDER_SITE_OTHER): Payer: 59 | Admitting: Family Medicine

## 2023-08-10 VITALS — BP 149/80 | HR 62 | Ht 65.0 in | Wt 270.0 lb

## 2023-08-10 DIAGNOSIS — K219 Gastro-esophageal reflux disease without esophagitis: Secondary | ICD-10-CM

## 2023-08-10 DIAGNOSIS — R634 Abnormal weight loss: Secondary | ICD-10-CM | POA: Diagnosis not present

## 2023-08-10 MED ORDER — PANTOPRAZOLE SODIUM 20 MG PO TBEC: 20.0000 mg | DELAYED_RELEASE_TABLET | Freq: Every day | ORAL | 3 refills | Status: AC

## 2023-08-10 NOTE — Assessment & Plan Note (Signed)
Refilled Pantoprazole 20 mg daily

## 2023-08-10 NOTE — Patient Instructions (Signed)
It was great to see you today! Thank you for choosing [facility] for your primary care. Sherre Lain was seen for establishing care.  Today we addressed: Weight Loss - please ensure that you commit to your dietary modifications and eat lean protein and vegetables over carbs. Avoid sugary and fatty foods. Please consider water aerobics and increasing your exercise as able. Continue Ozempic and Topamax as prescribed. Will place referral to weight loss clinic for further help.  Refilled Protonix today  You should return to our clinic No follow-ups on file. Please arrive 15 minutes before your appointment to ensure smooth check in process.  We appreciate your efforts in making this happen.  Thank you for allowing me to participate in your care, Fortunato Curling, DO 08/10/2023, 11:05 AM PGY-1, [facility]

## 2023-08-14 LAB — TOXASSURE SELECT,+ANTIDEPR,UR

## 2023-08-17 IMAGING — DX DG CHEST 1V PORT
1 series · 1 of 1 positions shown · non-contrast
Comparison: December 28, 2019

CLINICAL DATA: Chest pain.

EXAM:
PORTABLE CHEST 1 VIEW

[chest]
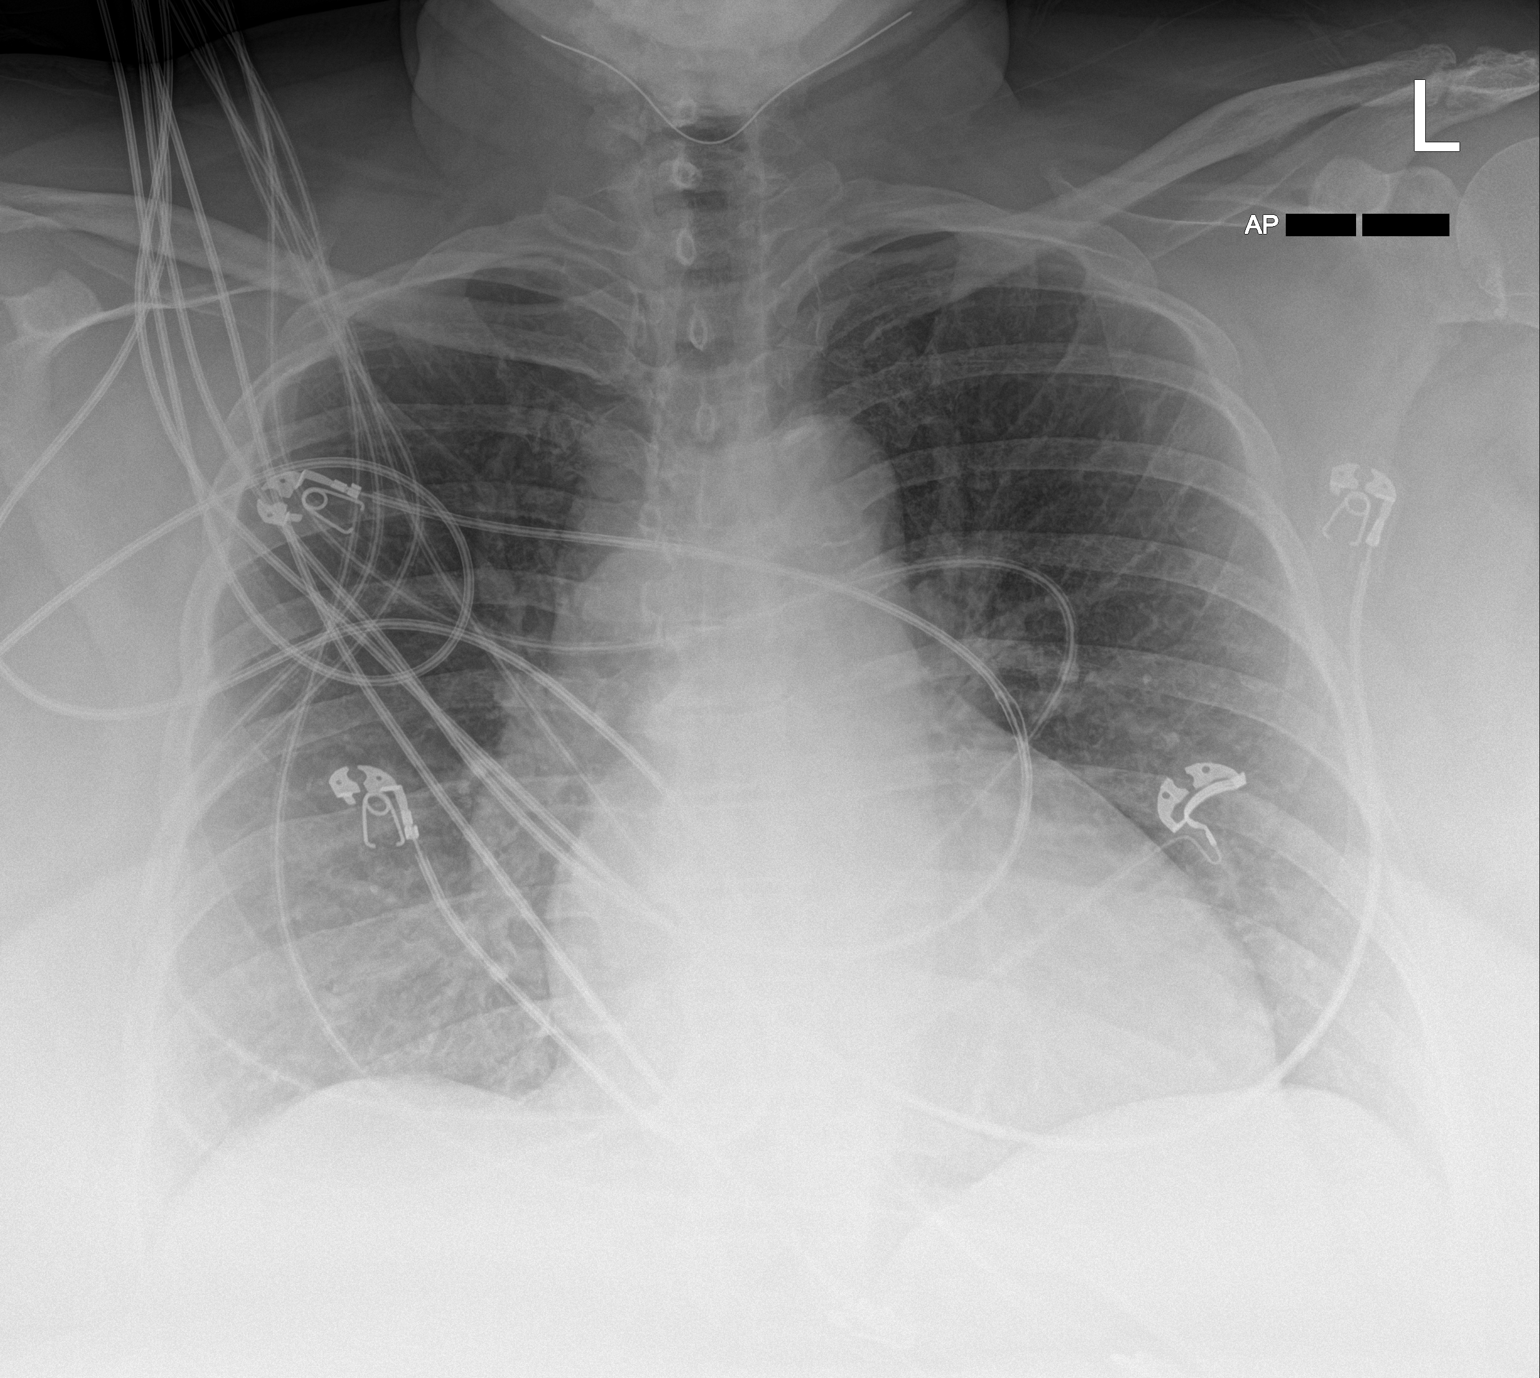

[1 of 1 positions shown; findings below may reference images not displayed]

FINDINGS: The heart size is borderline to mildly enlarged. The hila and
mediastinum are normal. No pneumothorax. No nodules or masses. No
focal infiltrates or overt edema.
IMPRESSION: No active disease.

## 2023-08-27 ENCOUNTER — Encounter: Payer: Self-pay | Admitting: Physical Medicine and Rehabilitation

## 2023-08-27 ENCOUNTER — Encounter: Payer: 59 | Attending: Physical Medicine and Rehabilitation | Admitting: Physical Medicine and Rehabilitation

## 2023-08-27 VITALS — BP 163/88 | HR 64 | Ht 65.0 in | Wt 274.6 lb

## 2023-08-27 DIAGNOSIS — M7062 Trochanteric bursitis, left hip: Secondary | ICD-10-CM | POA: Diagnosis not present

## 2023-08-27 DIAGNOSIS — G894 Chronic pain syndrome: Secondary | ICD-10-CM | POA: Insufficient documentation

## 2023-08-27 DIAGNOSIS — M25561 Pain in right knee: Secondary | ICD-10-CM | POA: Diagnosis not present

## 2023-08-27 DIAGNOSIS — R2689 Other abnormalities of gait and mobility: Secondary | ICD-10-CM | POA: Diagnosis present

## 2023-08-27 DIAGNOSIS — M25562 Pain in left knee: Secondary | ICD-10-CM | POA: Diagnosis not present

## 2023-08-27 DIAGNOSIS — M7061 Trochanteric bursitis, right hip: Secondary | ICD-10-CM | POA: Diagnosis not present

## 2023-08-27 DIAGNOSIS — M47816 Spondylosis without myelopathy or radiculopathy, lumbar region: Secondary | ICD-10-CM | POA: Diagnosis not present

## 2023-08-27 MED ORDER — CAPSAICIN-CLEANSING GEL 8 % EX KIT
1.0000 | PACK | Freq: Once | CUTANEOUS | Status: AC
  Administered 2023-08-27: 1 via TOPICAL

## 2023-08-27 MED ORDER — TOPIRAMATE 50 MG PO TABS: 50.0000 mg | ORAL_TABLET | Freq: Two times a day (BID) | ORAL | 3 refills | Status: AC

## 2023-08-27 NOTE — Patient Instructions (Signed)
 [facility]  HTN: -Advised checking BP daily at home and logging results to bring into follow-up appointment with PCP and myself. -Reviewed BP meds today.  -Advised regarding healthy foods that can help lower blood pressure and provided with a list: 1) citrus foods- high in vitamins and minerals 2) salmon and other fatty fish - reduces inflammation and oxylipins 3) swiss chard (leafy green)- high level of nitrates 4) pumpkin seeds- one of the best natural sources of magnesium 5) Beans and lentils- high in fiber, magnesium, and potassium 6) Berries- high in flavonoids 7) Amaranth (whole grain, can be cooked similarly to rice and oats)- high in magnesium and fiber 8) Pistachios- even more effective at reducing BP than other nuts 9) Carrots- high in phenolic compounds that relax blood vessels and reduce inflammation 10) Celery- contain phthalides that relax tissues of arterial walls 11) Tomatoes- can also improve cholesterol and reduce risk of heart disease 12) Broccoli- good source of magnesium, calcium , and potassium 13) Greek yogurt: high in potassium and calcium  14) Herbs and spices: Celery seed, cilantro, saffron, lemongrass, black cumin, ginseng, cinnamon, cardamom, sweet basil, and ginger 15) Chia and flax seeds- also help to lower cholesterol and blood sugar 16) Beets- high levels of nitrates that relax blood vessels  17) spinach and bananas- high in potassium  -Provided lise of supplements that can help with hypertension:  1) magnesium: one high quality brand is Bioptemizers since it contains all 7 types of magnesium, otherwise over the counter magnesium gluconate 400mg  is a good option 2) B vitamins 3) vitamin D  4) potassium 5) CoQ10 6) L-arginine 7) Vitamin C 8) Beetroot -Educated that goal BP is 120/80. -Made goal to incorporate some of the above foods into diet.

## 2023-08-27 NOTE — Progress Notes (Signed)
-  Discussed Qutenza  as an option for neuropathic pain control. Discussed that this is a capsaicin  patch, stronger than capsaicin  cream. Discussed that it is currently approved for diabetic peripheral neuropathy and post-herpetic neuralgia, but that it has also shown benefit in treating other forms of neuropathy. Provided patient with link to site to learn more about the patch: https://www.qutenza .com/. Discussed that the patch would be placed in office and benefits usually last 3 months. Discussed that unintended exposure to capsaicin  can cause severe irritation of eyes, mucous membranes, respiratory tract, and skin, but that Qutenza  is a local treatment and does not have the systemic side effects of other nerve medications. Discussed that there may be pain, itching, erythema, and decreased sensory function associated with the application of Qutenza . Side effects usually subside within 1 week. A cold pack of analgesic medications can help with these side effects. Blood pressure can also be increased due to pain associated with administration of the patch.   4 patches of Qutenza  [phone]) was applied to the lower back bilaterally. Ice packs were applied during the procedure to ensure patient comfort. Blood pressure was monitored every 15 minutes. The patient tolerated the procedure well. Post-procedure instructions were given and follow-up has been scheduled.  Topical system measures 14cm x20cm (280cm for a total 1120units) were applied which will cause deeper penetration for destruction of the peripheral nerve using a chemical (Qutenza ) which infuses into the skin like an injection and heat technique (occlusive, compressive dressing cauing endothermic heat technique)

## 2023-08-27 NOTE — Progress Notes (Signed)
 Subjective:    Patient ID: Kelsey Santana, female    DOB: August 25, 1960, 63 y.o.   MRN: [id number]  HPI:   1) Bilateral knee pain -they had to help her out of church because of her knee pain -[place] -would like to get an XR -she would like bilateral steroid injections -norco helps but makes her sleepy  2) obesity [name] is a 63 y.o. female who returns for f/u appointment for chronic pain secondary to bilateral knee osteoarthritis, obesity, and migraines. She states her pain is located in her lower back radiating into her right lower extremity, bilateral hip pain R>L and bilateral knee pain R>L. She rates her pain 7. Her current exercise regime is walking and performing stretching exercises. -she lost a lot of weight, she has gone from 213 to 247 lbs and weight loss has since stalled -she is continuing on the Ozempic  2mg  -she had stopped eating sugars,  -right knee is feeling better but left is hurting more. She does not want a knee replacement -right knee aches more at night -can't climb up stairs -she has to order groceries from [employer] -she has lost 77 lbs with the [facility]  Ms. Macphail Morphine  equivalent is 33.33 MME.  She is upset about about her weight gain and is consider bypass surgery, has lost weight with semalglutide but weight loss has plateued -she is mostly eating fruits now, not eating much protein or vegetables -CBGs dropped while taking semalgutide and metformin  so she stopped metformin  -CBGs now stable -she is only eating one meal per day -has diffuse bone pain Knee pain has been severe She has been sleeping poorly at night She [facility] She is having severe migraines that have been responsive to Migranol in the past. She has failed antidepressants, Depakote, opioids, topamax , Wellbutrin . She has noticied that opioids could trigger migraines when she is further from the dose. She has been taught elimination diet.   -has been very severe -she had trouble walking out of [employer] recently -she is not sure if the Zilretta  was as effective as the steroid injections but would still like to try next visit  -she is considering gastric sleeve -she has to make herself eat -she has stopped sodas.  -she is trying to eat more protein -she bought Ensure Max -drinks water all day -lost 14 lbs since starting Ozempic ! -only can take a couple of bites until she is full -she is trying to avoid bread, meat.  -she would like to try a medication -she does not want to try phenteramine when I shared that I have a patient who tried it and had a stroke -she would like to try Ozempic  as she heard a friend lost a lot of weight on it and it curbed his appetite -she has been trying to eat healthier   3) Insomnia:  -sleeping poorly due to her pain and grief over son's death  4) Lower back pain: -low back pain present  5) Bilateral hip pain: -thinks this is stemming from her knee pain  6) Impaired balance: -interested in rehab   Pain Inventory Average Pain 10 Pain Right Now 8 My pain is constant, sharp, and aching stabbing  In the last 24 hours, has pain interfered with the following? General activity  Relation with others  Enjoyment of life 1 What TIME of day is your pain at its worst? morning , daytime, evening, and night Sleep (in  general) Poor  Pain is worse with: walking bending sitting standing Pain improves with: medication and injections rest Relief from Meds:   Family History  Problem Relation Age of Onset   Alcohol abuse Mother    Diabetes Mother    Hyperlipidemia Mother    Hypertension Mother    COPD Mother    Diabetes Brother    Hyperlipidemia Brother    Hypertension Brother    Depression Brother    Stroke Brother    Thyroid  disease Neg Hx    Social History   Socioeconomic History   Marital status: Single    Spouse name: Not on file   Number of children: Not on file   Years of  education: Not on file   Highest education level: Not on file  Occupational History   Not on file  Tobacco Use   Smoking status: Former   Smokeless tobacco: Former   Tobacco comments:    quit 28 yrs ago  Psychologist, Educational Use   Vaping status: Never Used  Substance and Sexual Activity   Alcohol use: No   Drug use: No   Sexual activity: Never  Other Topics Concern   Not on file  Social History Narrative   epworth sleepiness scale = 10 (11/15/15)    Lives alone in a 2 story home.  Has 3 children.  Currently not working.  Has lost 6 jobs in the past 2 months due to her leg numbness and pain.     Education: 2 years of college.   Social Drivers of Corporate Investment Banker Strain: Not on file  Food Insecurity: Not on file  Transportation Needs: Not on file  Physical Activity: Not on file  Stress: Not on file  Social Connections: Unknown (01/02/2022)   Received from [facility]   Social Network    Social Network: Not on file   Past Surgical History:  Procedure Laterality Date   ABDOMINAL HYSTERECTOMY     complete   COLONOSCOPY N/A 01/16/2017   Procedure: COLONOSCOPY;  Surgeon: Rollin Dover, MD;  Location: [facility];  Service: Endoscopy;  Laterality: N/A;   ESOPHAGOGASTRODUODENOSCOPY N/A 01/16/2017   Procedure: ESOPHAGOGASTRODUODENOSCOPY (EGD);  Surgeon: Rollin Dover, MD;  Location: [facility];  Service: Endoscopy;  Laterality: N/A;   Past Surgical History:  Procedure Laterality Date   ABDOMINAL HYSTERECTOMY     complete   COLONOSCOPY N/A 01/16/2017   Procedure: COLONOSCOPY;  Surgeon: Rollin Dover, MD;  Location: [facility];  Service: Endoscopy;  Laterality: N/A;   ESOPHAGOGASTRODUODENOSCOPY N/A 01/16/2017   Procedure: ESOPHAGOGASTRODUODENOSCOPY (EGD);  Surgeon: Rollin Dover, MD;  Location: [facility];  Service: Endoscopy;  Laterality: N/A;   Past Medical History:  Diagnosis Date   Anxiety    Arthritis    Back pain    l 4 and l5 djd   GERD  (gastroesophageal reflux disease)    Hypercholesteremia    Hypertension    Migraine    Pre-diabetes    BP (!) 163/88   Pulse 64   Ht 5' 5 (1.651 m)   Wt 274 lb 9.6 oz (124.6 kg)   SpO2 98%   BMI 45.70 kg/m   Opioid Risk Score:   Fall Risk Score:  `1  Depression screen [facility] 2/9     08/27/2023    1:04 PM 08/09/2023    8:41 AM 06/14/2023    9:51 AM 06/05/2023    9:24 AM 05/11/2023    9:14 AM 04/12/2023   11:13 AM 03/20/2023  9:40 AM  Depression screen PHQ 2/9  Decreased Interest 0 0 1 0 0 1 1  Down, Depressed, Hopeless 0 0 1 0 0 1 1  PHQ - 2 Score 0 0 2 0 0 2 2    Review of Systems  Musculoskeletal:  Positive for back pain, gait problem and joint swelling.       Bilateral knee pain  All other systems reviewed and are negative.      Objective:  Gen: no distress, normal appearing HEENT: oral mucosa pink and moist, NCAT Cardio: Reg rate Chest: normal effort, normal rate of breathing Abd: soft, non-distended Ext: no edema Psych: pleasant, normal affect Skin: intact MSK: bilateral knee TTP, stable    Assessment & Plan:  Right Lumbar Radiculitis: Continue Gabapentin  . Continue HEP as Tolerated. Continue to monitor. XR ordered  Bilateral Greater Trochanter Tenderness: Continue to Alternate Ice and Heat Therapy. Continue to Monitor. X-rays ordered to assess for OA Prescribing Home Zynex NexWave Stimulator Device and supplies as needed. IFC, NMES and TENS medically necessary Treatment Rx: Daily @ 30-40 minutes per treatment PRN. Zynex NexWave only, no substitutions. Treatment Goals: 1) To reduce and/or eliminate pain 2) To improve functional capacity and Activities of daily living 3) To reduce or prevent the need for oral medications 4) To improve circulation in the injured region 5) To decrease or prevent muscle spasm and muscle atrophy 6) To provide a self-management tool to the patient The patient has not sufficiently improved with conservative care. Numerous studies  indexed by Medline and PubMed.gov have shown Neuromuscular, Interferential, and TENS stimulators to reduce pain, improve function, and reduce medication use in injured patients. Continued use of this evidence based, safe, drug free treatment is both reasonable and medically necessary at this time.   Bilateral Primary Osteoarthritis of Bilateral Knees: Continue  HEP as Tolerated . Continue to Monitor. Discussed that continued weight loss with also help her knee pain.  inserted into the knee joint.   Chronic Pain Syndrome 2/2 knee OA: Refilled hydrocodone  to 10mg  QID prn We will continue the opioid monitoring program, this consists of regular clinic visits, examinations, urine drug screen, pill counts as well as use of [place]  Controlled Substance Reporting system.  UDS reviewed and with expected metabilities  5. Obesity:  -weight is 246 lbs, discussed that she is doing a great job with her diet! -refilled Ozempic  since Zepbound  was denied  -encouraged resistance training -advised to make sure her morning protein shake has no added sugar or artifical sweeteners other than allulose or stevia Discussed following criteria for Wegovy: 1) Patient has diagnosis of obesity; 2) Patient must be 54 years of age or older; 3) The patient has been involved in a physician or dietitian monitored weight loss program, consisting of both low-calorie diet, increased physical activity and behavioral counseling for a minimum of 6 months without a 3% loss from baseline; 4) Patients BMI is one of the following: a) 30kg/m or greater; b) 27 29.99 kg/m in the presence of at least one weight related comorbidity such as coronary heart disease, dyslipidemia, hypertension, symptomatic arthritis of the lower extremities, type 2 diabetes mellitus, obstructive sleep apnea; c) 25-29.9 kg/m2 and waist circumference is &gt; 40 inches for males or &gt; 35 inches for females;5) Not Met - Must have tried and failed at least one  preferred oral agent such as Phentermine; 6) Patient has no labeled contraindication; 7) Patient is not taking another weight loss medication; 8) The dose is within approved  product labeling guidelines; 10) The patient must not be taking another GLP-1 receptor agonist AND the patient must not be taking insulin concurrently.  -encouraged stopping eating added sugars -continue nuts -discussed eating protein daily -resistance training daily.  -recommended protein sources -continue avoiding bread.  -recommended eating eggs rather than Ensure Max.  -continue shrimp as source of protein. Mercury in salmon may be contributing to her joint symptoms. Advised to stop sodas and made plan to do so. She did not tolerate topamax  well. Discussed gastric bypass surgery and gastric sleeve -prescribed ozempic , discussed its mechanism of action, side effects, increase dose 2mg  -Discussed the benefits of intermittent fasting. Recommended starting with pushing dinner 15 minutes earlier and when this feels easy, continuing to push dinner 15 minutes earlier. Discussed that this can help her body to improve its ability to burn fat rather than glucose, improving insulin sensitivity. Recommended drinking Roobois tea in the evening to help curb appetite and for its numerous health benefits.   -discussed we can consider increasing metformin  if she does not tolerate ozempic   6. Insomnia: -Try to go outside near sunrise -Get exercise during the day.  -Turn off all devices an hour before bedtime.  -Teas that can benefit: chamomile, valerian root, Brahmi (Bacopa) -Can consider over the counter melatonin, magnesium, and/or L-theanine. Melatonin is an anti-oxidant with multiple health benefits. Magnesium is involved in greater than 300 enzymatic reactions in the body and most of us  are deficient as our soil is often depleted. There are 7 different types of magnesium- Bioptemizer's is a supplement with all 7 types, and each has  unique benefits. Magnesium can also help with constipation and anxiety.  -Pistachios naturally increase the production of melatonin -Cozy Earth bamboo bed sheets are free from toxic chemicals.  -Tart cherry juice or a tart cherry supplement can improve sleep and soreness post-workout   7. Migraines:  -continue migranol for her headaches- this has worked for her in the past and she has failed multiple other medications (listed above)  8) Vitamin D  deficiency: Continue ergocalciferol  50,000U once per week for 14 weeks.   9) HTN: -sent amlodipine  instead of Lisinopril  because latter is causing her to cough -asked nursing to recheck BP -discussed with patient that may be elevated because she did not take her BP medication this morning -Advised checking BP daily at home and logging results to bring into follow-up appointment with PCP and myself. -Reviewed BP meds today.  -Advised regarding healthy foods that can help lower blood pressure and provided with a list: 1) citrus foods- high in vitamins and minerals 2) salmon and other fatty fish - reduces inflammation and oxylipins 3) swiss chard (leafy green)- high level of nitrates 4) pumpkin seeds- one of the best natural sources of magnesium 5) Beans and lentils- high in fiber, magnesium, and potassium 6) Berries- high in flavonoids 7) Amaranth (whole grain, can be cooked similarly to rice and oats)- high in magnesium and fiber 8) Pistachios- even more effective at reducing BP than other nuts 9) Carrots- high in phenolic compounds that relax blood vessels and reduce inflammation 10) Celery- contain phthalides that relax tissues of arterial walls 11) Tomatoes- can also improve cholesterol and reduce risk of heart disease 12) Broccoli- good source of magnesium, calcium , and potassium 13) Greek yogurt: high in potassium and calcium  14) Herbs and spices: Celery seed, cilantro, saffron, lemongrass, black cumin, ginseng, cinnamon, cardamom, sweet  basil, and ginger 15) Chia and flax seeds- also help to lower cholesterol and blood  sugar 16) Beets- high levels of nitrates that relax blood vessels  17) spinach and bananas- high in potassium  -Provided lise of supplements that can help with hypertension:  1) magnesium: one high quality brand is Bioptemizers since it contains all 7 types of magnesium, otherwise over the counter magnesium gluconate 400mg  is a good option 2) B vitamins 3) vitamin D  4) potassium 5) CoQ10 6) L-arginine 7) Vitamin C 8) Beetroot -Educated that goal BP is 120/80. -Made goal to incorporate some of the above foods into diet.     10) Impaired balance -referred to PT

## 2023-08-27 NOTE — Addendum Note (Signed)
 Addended by: Horton Chin on: 08/27/2023 02:10 PM   Modules accepted: Level of Service

## 2023-08-27 NOTE — Addendum Note (Signed)
 Addended by: Horton Chin on: 08/27/2023 01:25 PM   Modules accepted: Orders

## 2023-08-30 ENCOUNTER — Inpatient Hospital Stay (HOSPITAL_COMMUNITY): Admission: RE | Admit: 2023-08-30 | Payer: 59 | Source: Ambulatory Visit

## 2023-08-31 ENCOUNTER — Other Ambulatory Visit (HOSPITAL_COMMUNITY): Payer: 59

## 2023-09-11 ENCOUNTER — Ambulatory Visit: Payer: 59

## 2023-09-17 ENCOUNTER — Encounter (HOSPITAL_BASED_OUTPATIENT_CLINIC_OR_DEPARTMENT_OTHER): Payer: 59 | Admitting: Physical Medicine and Rehabilitation

## 2023-09-17 VITALS — BP 155/89 | HR 66 | Ht 65.0 in | Wt 266.0 lb

## 2023-09-17 DIAGNOSIS — M25562 Pain in left knee: Secondary | ICD-10-CM | POA: Diagnosis not present

## 2023-09-17 DIAGNOSIS — M25561 Pain in right knee: Secondary | ICD-10-CM | POA: Diagnosis not present

## 2023-09-17 DIAGNOSIS — G894 Chronic pain syndrome: Secondary | ICD-10-CM

## 2023-09-17 DIAGNOSIS — M47816 Spondylosis without myelopathy or radiculopathy, lumbar region: Secondary | ICD-10-CM | POA: Diagnosis not present

## 2023-09-17 MED ORDER — BETAMETHASONE SOD PHOS & ACET 6 (3-3) MG/ML IJ SUSP
12.0000 mg | Freq: Once | INTRAMUSCULAR | Status: AC
  Administered 2023-09-17: 12 mg via INTRAMUSCULAR

## 2023-09-17 MED ORDER — LIDOCAINE HCL 1 % IJ SOLN
8.0000 mL | Freq: Once | INTRAMUSCULAR | Status: AC
  Administered 2023-09-17: 8 mL

## 2023-09-17 MED ORDER — HYDROCODONE-ACETAMINOPHEN 10-325 MG PO TABS: 1.0000 | ORAL_TABLET | Freq: Four times a day (QID) | ORAL | 0 refills | Status: DC | PRN

## 2023-09-17 NOTE — Addendum Note (Signed)
Addended by: Horton Chin on: 09/17/2023 11:26 AM   Modules accepted: Orders

## 2023-09-17 NOTE — Progress Notes (Signed)
Knee injection, bilateral  Indication: Bilateral knee pain not relieved by medication management and other conservative care.  Informed consent was obtained after describing risks and benefits of the procedure with the patient, this includes bleeding, bruising, infection and medication side effects. The patient wishes to proceed and has given written consent. The patient was placed in a recumbent position. The medial aspect of the knee was marked and prepped with Betadine and alcohol. It was then entered with a 25-gauge 1-1/2 inch needle and 1 mL of 1% lidocaine was injected into the knee joint. After negative draw back for blood, a solution containing steroid was injected. This was repeated on the other side. The patient tolerated the procedure well. Post procedure instructions were given.

## 2023-09-26 NOTE — Progress Notes (Signed)
 Surgical Instructions   Your procedure is scheduled on Wednesday, October 03, 2023. Report to [facility] Main Entrance A at 6:30 A.M., then check in with the Admitting office. Any questions or running late day of surgery: call [phone]  Questions prior to your surgery date: call [phone], Monday-Friday, 8am-4pm. If you experience any cold or flu symptoms such as cough, fever, chills, shortness of breath, etc. between now and your scheduled surgery, please notify us  at the above number.     Remember:  Do not eat after midnight the night before your surgery   You may drink clear liquids until 5:30 the morning of your surgery.   Clear liquids allowed are: Water, Non-Citrus Juices (without pulp), Carbonated Beverages, Clear Tea (no milk, honey, etc.), Black Coffee Only (NO MILK, CREAM OR POWDERED CREAMER of any kind), and Gatorade.    Take these medicines the morning of surgery with A SIP OF WATER  amLODipine  (NORVASC )  pantoprazole  (PROTONIX )  topiramate  (TOPAMAX )    May take these medicines IF NEEDED: HYDROcodone -acetaminophen  (NORCO)   Do not take your METFORMIN  the day of surgery.  Your last dose of Metformin  should be on 10-02-23.  7 days prior to surgery STOP taking your Semaglutide  (OZEMPIC ).  Your last dose of Semaglutide  (OZEMPIC ) should be on or before 09-25-2023.  One week prior to surgery, STOP taking any Aspirin  (unless otherwise instructed by your surgeon) Aleve , Naproxen , Ibuprofen , Motrin , Advil , Goody's, BC's, all herbal medications, fish oil, and non-prescription vitamins.                     Do NOT Smoke (Tobacco/Vaping) for 24 hours prior to your procedure.  If you use a CPAP at night, you may bring your mask/headgear for your overnight stay.   You will be asked to remove any contacts, glasses, piercing's, hearing aid's, dentures/partials prior to surgery. Please bring cases for these items if needed.    Patients discharged the day of surgery will not  be allowed to drive home, and someone needs to stay with them for 24 hours.  SURGICAL WAITING ROOM VISITATION Patients may have no more than 2 support people in the waiting area - these visitors may rotate.   Pre-op nurse will coordinate an appropriate time for 1 ADULT support person, who may not rotate, to accompany patient in pre-op.  Children under the age of 19 must have an adult with them who is not the patient and must remain in the main waiting area with an adult.  If the patient needs to stay at the hospital during part of their recovery, the visitor guidelines for inpatient rooms apply.  Please refer to the [facility] website for the visitor guidelines for any additional information.   If you received a COVID test during your pre-op visit  it is requested that you wear a mask when out in public, stay away from anyone that may not be feeling well and notify your surgeon if you develop symptoms. If you have been in contact with anyone that has tested positive in the last 10 days please notify you surgeon.      Pre-operative CHG Bathing Instructions   You can play a key role in reducing the risk of infection after surgery. Your skin needs to be as free of germs as possible. You can reduce the number of germs on your skin by washing with CHG (chlorhexidine  gluconate) soap before surgery. CHG is an antiseptic soap that kills germs and continues to kill  germs even after washing.   DO NOT use if you have an allergy to chlorhexidine /CHG or antibacterial soaps. If your skin becomes reddened or irritated, stop using the CHG and notify one of our RNs at [phone].              TAKE A SHOWER THE NIGHT BEFORE SURGERY AND THE DAY OF SURGERY    Please keep in mind the following:  DO NOT shave, including legs and underarms, 48 hours prior to surgery.   You may shave your face before/day of surgery.  Place clean sheets on your bed the night before surgery Use a clean washcloth (not used  since being washed) for each shower. DO NOT sleep with pet's night before surgery.  CHG Shower Instructions:  Wash your face and private area with normal soap. If you choose to wash your hair, wash first with your normal shampoo.  After you use shampoo/soap, rinse your hair and body thoroughly to remove shampoo/soap residue.  Turn the water OFF and apply half the bottle of CHG soap to a CLEAN washcloth.  Apply CHG soap ONLY FROM YOUR NECK DOWN TO YOUR TOES (washing for 3-5 minutes)  DO NOT use CHG soap on face, private areas, open wounds, or sores.  Pay special attention to the area where your surgery is being performed.  If you are having back surgery, having someone wash your back for you may be helpful. Wait 2 minutes after CHG soap is applied, then you may rinse off the CHG soap.  Pat dry with a clean towel  Put on clean pajamas    Additional instructions for the day of surgery: DO NOT APPLY any lotions, deodorants, cologne, or perfumes.   Do not wear jewelry or makeup Do not wear nail polish, gel polish, artificial nails, or any other type of covering on natural nails (fingers and toes) Do not bring valuables to the hospital. [facility] is not responsible for valuables/personal belongings. Put on clean/comfortable clothes.  Please brush your teeth.  Ask your nurse before applying any prescription medications to the skin.

## 2023-09-27 ENCOUNTER — Encounter: Payer: 59 | Admitting: Nurse Practitioner

## 2023-09-27 ENCOUNTER — Encounter (HOSPITAL_COMMUNITY): Payer: Self-pay

## 2023-09-27 ENCOUNTER — Encounter (HOSPITAL_COMMUNITY)
Admission: RE | Admit: 2023-09-27 | Discharge: 2023-09-27 | Disposition: A | Discharge status: Home / Self Care | Payer: 59 | Source: Ambulatory Visit | Attending: Surgery | Admitting: Surgery

## 2023-09-27 ENCOUNTER — Other Ambulatory Visit: Payer: Self-pay

## 2023-09-27 VITALS — BP 159/73 | HR 53 | Temp 97.7°F | Resp 18 | Ht 64.0 in | Wt 267.2 lb

## 2023-09-27 DIAGNOSIS — K769 Liver disease, unspecified: Secondary | ICD-10-CM | POA: Insufficient documentation

## 2023-09-27 DIAGNOSIS — Z01818 Encounter for other preprocedural examination: Secondary | ICD-10-CM

## 2023-09-27 DIAGNOSIS — Z01812 Encounter for preprocedural laboratory examination: Secondary | ICD-10-CM | POA: Insufficient documentation

## 2023-09-27 DIAGNOSIS — I10 Essential (primary) hypertension: Secondary | ICD-10-CM | POA: Diagnosis not present

## 2023-09-27 HISTORY — DX: Depression, unspecified: F32.A

## 2023-09-27 HISTORY — DX: Fatty (change of) liver, not elsewhere classified: K76.0

## 2023-09-27 LAB — COMPREHENSIVE METABOLIC PANEL
ALT: 16 U/L (ref 0–44)
AST: 17 U/L (ref 15–41)
Albumin: 3.4 g/dL — ABNORMAL LOW (ref 3.5–5.0)
Alkaline Phosphatase: 67 U/L (ref 38–126)
Anion gap: 9 (ref 5–15)
BUN: 11 mg/dL (ref 8–23)
CO2: 27 mmol/L (ref 22–32)
Calcium: 9.3 mg/dL (ref 8.9–10.3)
Chloride: 104 mmol/L (ref 98–111)
Creatinine, Ser: 0.89 mg/dL (ref 0.44–1.00)
GFR, Estimated: >60 mL/min (ref >60)
Glucose, Bld: 91 mg/dL (ref 70–99)
Potassium: 4.5 mmol/L (ref 3.5–5.1)
Sodium: 140 mmol/L (ref 135–145)
Total Bilirubin: 0.5 mg/dL (ref 0.0–1.2)
Total Protein: 6.7 g/dL (ref 6.5–8.1)

## 2023-09-27 LAB — CBC
HCT: 43.4 % (ref 36.0–46.0)
Hemoglobin: 14.2 g/dL (ref 12.0–15.0)
MCH: 29.3 pg (ref 26.0–34.0)
MCHC: 32.7 g/dL (ref 30.0–36.0)
MCV: 89.7 fL (ref 80.0–100.0)
Platelets: 257 10*3/uL (ref 150–400)
RBC: 4.84 MIL/uL (ref 3.87–5.11)
RDW: 13.2 % (ref 11.5–15.5)
WBC: 6.9 10*3/uL (ref 4.0–10.5)
nRBC: 0 % (ref 0.0–0.2)

## 2023-09-27 NOTE — Progress Notes (Signed)
 PCP - Dr. Kathrine Melena Cardiologist - denies  Chest x-ray -  EKG - 12/26/22 Stress Test - 12/10/2015 ECHO - 02/17/2019 Cardiac Cath - 11/24/2009  Sleep Study - denies CPAP -   Fasting Blood Sugar - Pt reports she has pre-diabetes, does not check CBG at home. Last A1C 5.6 on 04/19/23  Last dose of GLP1 agonist-  Ozempic  for weight loss GLP1 instructions: Hold 1 week, last dose 09/23/23  Blood Thinner Instructions: n/a Aspirin  Instructions:  ERAS Protcol - clears until 5:30am  COVID TEST- n/a   Patient denies shortness of breath, fever, cough and chest pain at PAT appointment

## 2023-09-27 NOTE — Progress Notes (Deleted)
    SUBJECTIVE:   CHIEF COMPLAINT / HPI:   Weight loss- on Ozempic  2mg  and topiramate  25 mg BID. Referred in Dec to healthy weight and wellness.   PERTINENT  PMH / PSH: GERD, HTN, HLD,   OBJECTIVE:   There were no vitals taken for this visit.  General: A&O, NAD HEENT: No sign of trauma, EOM grossly intact Cardiac: RRR, no m/r/g Respiratory: CTAB, normal WOB, no w/c/r GI: Soft, NTTP, non-distended  Extremities: NTTP, no peripheral edema. Neuro: Normal gait, moves all four extremities appropriately. Psych: Appropriate mood and affect   ASSESSMENT/PLAN:   Assessment & Plan      Rollene FORBES Keeling, MD [facility]

## 2023-09-28 ENCOUNTER — Ambulatory Visit: Payer: 59 | Admitting: Family Medicine

## 2023-10-03 ENCOUNTER — Ambulatory Visit (HOSPITAL_COMMUNITY): Payer: 59 | Admitting: Anesthesiology

## 2023-10-03 ENCOUNTER — Ambulatory Visit (HOSPITAL_COMMUNITY)
Admission: RE | Admit: 2023-10-03 | Discharge: 2023-10-03 | Disposition: A | Discharge status: Home / Self Care | Payer: 59 | Attending: Surgery | Admitting: Surgery

## 2023-10-03 ENCOUNTER — Ambulatory Visit (HOSPITAL_COMMUNITY): Payer: 59

## 2023-10-03 ENCOUNTER — Encounter (HOSPITAL_COMMUNITY): Payer: Self-pay | Admitting: Surgery

## 2023-10-03 ENCOUNTER — Other Ambulatory Visit: Payer: Self-pay

## 2023-10-03 ENCOUNTER — Ambulatory Visit (HOSPITAL_BASED_OUTPATIENT_CLINIC_OR_DEPARTMENT_OTHER): Payer: 59 | Admitting: Anesthesiology

## 2023-10-03 ENCOUNTER — Encounter (HOSPITAL_COMMUNITY)
Admission: RE | Disposition: A | Discharge status: Home / Self Care | Payer: Self-pay | Source: Home / Self Care | Attending: Surgery

## 2023-10-03 DIAGNOSIS — K8010 Calculus of gallbladder with chronic cholecystitis without obstruction: Secondary | ICD-10-CM | POA: Diagnosis not present

## 2023-10-03 DIAGNOSIS — K219 Gastro-esophageal reflux disease without esophagitis: Secondary | ICD-10-CM | POA: Insufficient documentation

## 2023-10-03 DIAGNOSIS — Z87891 Personal history of nicotine dependence: Secondary | ICD-10-CM

## 2023-10-03 DIAGNOSIS — I10 Essential (primary) hypertension: Secondary | ICD-10-CM

## 2023-10-03 DIAGNOSIS — E66813 Obesity, class 3: Secondary | ICD-10-CM | POA: Diagnosis not present

## 2023-10-03 DIAGNOSIS — Z79899 Other long term (current) drug therapy: Secondary | ICD-10-CM | POA: Diagnosis not present

## 2023-10-03 DIAGNOSIS — F418 Other specified anxiety disorders: Secondary | ICD-10-CM

## 2023-10-03 DIAGNOSIS — Z6841 Body Mass Index (BMI) 40.0 and over, adult: Secondary | ICD-10-CM | POA: Diagnosis not present

## 2023-10-03 HISTORY — PX: CHOLECYSTECTOMY: SHX55

## 2023-10-03 HISTORY — PX: INTRAOPERATIVE CHOLANGIOGRAM: SHX5230

## 2023-10-03 SURGERY — LAPAROSCOPIC CHOLECYSTECTOMY WITH INTRAOPERATIVE CHOLANGIOGRAM
Anesthesia: General | Site: Abdomen

## 2023-10-03 MED ORDER — HYDROMORPHONE HCL 1 MG/ML IJ SOLN: 0.2500 mg | INTRAMUSCULAR | Status: DC | PRN

## 2023-10-03 MED ORDER — SODIUM CHLORIDE 0.9 % IR SOLN
Status: DC | PRN
  Administered 2023-10-03: 1000 mL

## 2023-10-03 MED ORDER — SODIUM CHLORIDE 0.9 % IV SOLN: 12.5000 mg | INTRAVENOUS | Status: DC | PRN

## 2023-10-03 MED ORDER — FENTANYL CITRATE (PF) 250 MCG/5ML IJ SOLN
INTRAMUSCULAR | Status: DC | PRN
  Administered 2023-10-03 (×4): 50 ug via INTRAVENOUS

## 2023-10-03 MED ORDER — ROCURONIUM BROMIDE 10 MG/ML (PF) SYRINGE
PREFILLED_SYRINGE | INTRAVENOUS | Status: DC | PRN
  Administered 2023-10-03: 15 mg via INTRAVENOUS
  Administered 2023-10-03: 50 mg via INTRAVENOUS

## 2023-10-03 MED ORDER — OXYCODONE HCL 5 MG PO TABS
ORAL_TABLET | ORAL | Status: AC
  Filled 2023-10-03: qty 1

## 2023-10-03 MED ORDER — 0.9 % SODIUM CHLORIDE (POUR BTL) OPTIME
TOPICAL | Status: DC | PRN
  Administered 2023-10-03: 1000 mL

## 2023-10-03 MED ORDER — KETOROLAC TROMETHAMINE 30 MG/ML IJ SOLN
INTRAMUSCULAR | Status: AC
  Filled 2023-10-03: qty 1

## 2023-10-03 MED ORDER — BUPIVACAINE-EPINEPHRINE 0.25% -1:200000 IJ SOLN
INTRAMUSCULAR | Status: DC | PRN
  Administered 2023-10-03: 9 mL

## 2023-10-03 MED ORDER — FENTANYL CITRATE (PF) 250 MCG/5ML IJ SOLN
INTRAMUSCULAR | Status: AC
  Filled 2023-10-03: qty 5

## 2023-10-03 MED ORDER — ESMOLOL HCL 100 MG/10ML IV SOLN
INTRAVENOUS | Status: AC
  Filled 2023-10-03: qty 10

## 2023-10-03 MED ORDER — SODIUM CHLORIDE 0.9 % IV SOLN
INTRAVENOUS | Status: DC | PRN
  Administered 2023-10-03: 6 mL

## 2023-10-03 MED ORDER — CHLORHEXIDINE GLUCONATE 0.12 % MT SOLN
15.0000 mL | Freq: Once | OROMUCOSAL | Status: AC
  Administered 2023-10-03: 15 mL via OROMUCOSAL
  Filled 2023-10-03: qty 15

## 2023-10-03 MED ORDER — AMISULPRIDE (ANTIEMETIC) 5 MG/2ML IV SOLN: 10.0000 mg | Freq: Once | INTRAVENOUS | Status: DC | PRN

## 2023-10-03 MED ORDER — OXYCODONE HCL 5 MG PO TABS
5.0000 mg | ORAL_TABLET | Freq: Once | ORAL | Status: AC | PRN
  Administered 2023-10-03: 5 mg via ORAL

## 2023-10-03 MED ORDER — CHLORHEXIDINE GLUCONATE CLOTH 2 % EX PADS: 6.0000 | MEDICATED_PAD | Freq: Once | CUTANEOUS | Status: DC

## 2023-10-03 MED ORDER — DEXAMETHASONE SODIUM PHOSPHATE 10 MG/ML IJ SOLN
INTRAMUSCULAR | Status: DC | PRN
  Administered 2023-10-03: 10 mg via INTRAVENOUS

## 2023-10-03 MED ORDER — SUGAMMADEX SODIUM 200 MG/2ML IV SOLN
INTRAVENOUS | Status: DC | PRN
  Administered 2023-10-03: 200 mg via INTRAVENOUS

## 2023-10-03 MED ORDER — DEXAMETHASONE SODIUM PHOSPHATE 10 MG/ML IJ SOLN
INTRAMUSCULAR | Status: AC
  Filled 2023-10-03: qty 1

## 2023-10-03 MED ORDER — ROCURONIUM BROMIDE 10 MG/ML (PF) SYRINGE
PREFILLED_SYRINGE | INTRAVENOUS | Status: AC
  Filled 2023-10-03: qty 10

## 2023-10-03 MED ORDER — ACETAMINOPHEN 500 MG PO TABS
1000.0000 mg | ORAL_TABLET | ORAL | Status: AC
  Administered 2023-10-03: 1000 mg via ORAL
  Filled 2023-10-03: qty 2

## 2023-10-03 MED ORDER — PROPOFOL 10 MG/ML IV BOLUS
INTRAVENOUS | Status: DC | PRN
  Administered 2023-10-03: 150 mg via INTRAVENOUS

## 2023-10-03 MED ORDER — CEFAZOLIN SODIUM-DEXTROSE 3-4 GM/150ML-% IV SOLN
3.0000 g | INTRAVENOUS | Status: AC
  Administered 2023-10-03: 3 g via INTRAVENOUS
  Filled 2023-10-03: qty 150

## 2023-10-03 MED ORDER — ONDANSETRON HCL 4 MG/2ML IJ SOLN
INTRAMUSCULAR | Status: AC
  Filled 2023-10-03: qty 2

## 2023-10-03 MED ORDER — ONDANSETRON HCL 4 MG/2ML IJ SOLN
INTRAMUSCULAR | Status: DC | PRN
  Administered 2023-10-03: 4 mg via INTRAVENOUS

## 2023-10-03 MED ORDER — ORAL CARE MOUTH RINSE: 15.0000 mL | Freq: Once | OROMUCOSAL | Status: AC

## 2023-10-03 MED ORDER — PROPOFOL 10 MG/ML IV BOLUS
INTRAVENOUS | Status: AC
  Filled 2023-10-03: qty 20

## 2023-10-03 MED ORDER — BUPIVACAINE-EPINEPHRINE (PF) 0.25% -1:200000 IJ SOLN
INTRAMUSCULAR | Status: AC
  Filled 2023-10-03: qty 30

## 2023-10-03 MED ORDER — LIDOCAINE 2% (20 MG/ML) 5 ML SYRINGE
INTRAMUSCULAR | Status: AC
  Filled 2023-10-03: qty 5

## 2023-10-03 MED ORDER — OXYCODONE HCL 5 MG/5ML PO SOLN: 5.0000 mg | Freq: Once | ORAL | Status: AC | PRN

## 2023-10-03 MED ORDER — LACTATED RINGERS IV SOLN: INTRAVENOUS | Status: DC

## 2023-10-03 MED ORDER — MIDAZOLAM HCL 2 MG/2ML IJ SOLN
INTRAMUSCULAR | Status: DC | PRN
  Administered 2023-10-03: 2 mg via INTRAVENOUS

## 2023-10-03 MED ORDER — MIDAZOLAM HCL 2 MG/2ML IJ SOLN
INTRAMUSCULAR | Status: AC
  Filled 2023-10-03: qty 2

## 2023-10-03 MED ORDER — MEPERIDINE HCL 25 MG/ML IJ SOLN
6.2500 mg | INTRAMUSCULAR | Status: DC | PRN
Start: 2023-10-03 — End: 2023-10-03

## 2023-10-03 MED ORDER — LIDOCAINE 2% (20 MG/ML) 5 ML SYRINGE
INTRAMUSCULAR | Status: DC | PRN
  Administered 2023-10-03: 100 mg via INTRAVENOUS

## 2023-10-03 MED ORDER — KETOROLAC TROMETHAMINE 30 MG/ML IJ SOLN
INTRAMUSCULAR | Status: DC | PRN
Start: 2023-10-03 — End: 2023-10-03
  Administered 2023-10-03: 15 mg via INTRAVENOUS

## 2023-10-03 MED ORDER — OXYCODONE HCL 5 MG PO TABS: 5.0000 mg | ORAL_TABLET | Freq: Four times a day (QID) | ORAL | 0 refills | Status: DC | PRN

## 2023-10-03 SURGICAL SUPPLY — 41 items
APPLIER CLIP ROT 10 11.4 M/L (STAPLE) ×1 IMPLANT
BAG COUNTER SPONGE SURGICOUNT (BAG) ×2 IMPLANT
BENZOIN TINCTURE PRP APPL 2/3 (GAUZE/BANDAGES/DRESSINGS) ×2 IMPLANT
CANISTER SUCT 3000ML PPV (MISCELLANEOUS) ×2 IMPLANT
CHLORAPREP W/TINT 26 (MISCELLANEOUS) ×2 IMPLANT
CLIP APPLIE ROT 10 11.4 M/L (STAPLE) ×2 IMPLANT
COVER MAYO STAND STRL (DRAPES) ×2 IMPLANT
COVER SURGICAL LIGHT HANDLE (MISCELLANEOUS) ×2 IMPLANT
DRAPE C-ARM 42X120 X-RAY (DRAPES) ×2 IMPLANT
DRSG TEGADERM 2-3/8X2-3/4 SM (GAUZE/BANDAGES/DRESSINGS) ×6 IMPLANT
DRSG TEGADERM 4X4.75 (GAUZE/BANDAGES/DRESSINGS) ×2 IMPLANT
ELECT REM PT RETURN 9FT ADLT (ELECTROSURGICAL) ×1 IMPLANT
ELECTRODE REM PT RTRN 9FT ADLT (ELECTROSURGICAL) ×2 IMPLANT
GAUZE SPONGE 2X2 8PLY STRL LF (GAUZE/BANDAGES/DRESSINGS) ×2 IMPLANT
GLOVE BIO SURGEON STRL SZ7 (GLOVE) ×2 IMPLANT
GLOVE BIOGEL PI IND STRL 7.5 (GLOVE) ×2 IMPLANT
GOWN STRL REUS W/ TWL LRG LVL3 (GOWN DISPOSABLE) ×6 IMPLANT
IRRIG SUCT STRYKERFLOW 2 WTIP (MISCELLANEOUS) ×1 IMPLANT
IRRIGATION SUCT STRKRFLW 2 WTP (MISCELLANEOUS) ×2 IMPLANT
KIT BASIN OR (CUSTOM PROCEDURE TRAY) ×2 IMPLANT
KIT TURNOVER KIT B (KITS) ×2 IMPLANT
NS IRRIG 1000ML POUR BTL (IV SOLUTION) ×2 IMPLANT
PAD ARMBOARD 7.5X6 YLW CONV (MISCELLANEOUS) ×2 IMPLANT
POUCH RETRIEVAL ECOSAC 10 (ENDOMECHANICALS) IMPLANT
SCISSORS LAP 5X35 DISP (ENDOMECHANICALS) ×2 IMPLANT
SET CHOLANGIOGRAPH 5 50 .035 (SET/KITS/TRAYS/PACK) ×2 IMPLANT
SET TUBE SMOKE EVAC HIGH FLOW (TUBING) ×2 IMPLANT
SLEEVE Z-THREAD 5X100MM (TROCAR) ×2 IMPLANT
SPECIMEN JAR SMALL (MISCELLANEOUS) ×2 IMPLANT
STRIP CLOSURE SKIN 1/2X4 (GAUZE/BANDAGES/DRESSINGS) ×2 IMPLANT
SUT MNCRL AB 4-0 PS2 18 (SUTURE) ×2 IMPLANT
SYS BAG RETRIEVAL 10MM (BASKET) IMPLANT
SYSTEM BAG RETRIEVAL 10MM (BASKET) IMPLANT
TOWEL GREEN STERILE (TOWEL DISPOSABLE) ×2 IMPLANT
TOWEL GREEN STERILE FF (TOWEL DISPOSABLE) ×2 IMPLANT
TRAY LAPAROSCOPIC MC (CUSTOM PROCEDURE TRAY) ×2 IMPLANT
TROCAR 11X100 Z THREAD (TROCAR) ×2 IMPLANT
TROCAR BALLN 12MMX100 BLUNT (TROCAR) ×2 IMPLANT
TROCAR Z-THREAD OPTICAL 5X100M (TROCAR) ×2 IMPLANT
WARMER LAPAROSCOPE (MISCELLANEOUS) ×2 IMPLANT
WATER STERILE IRR 1000ML POUR (IV SOLUTION) ×2 IMPLANT

## 2023-10-03 NOTE — Op Note (Signed)
Laparoscopic Cholecystectomy with IOC Procedure Note  Indications: This is a 63 year old female who presents with a 5-month history of intermittent upper abdominal pain associated with nausea, vomiting, and diarrhea. This tends to occur after eating, especially fatty foods. The patient underwent a very thorough evaluation including colonoscopy and EGD. These did not show any findings that would explain her symptoms. She had an ultrasound that showed multiple gallstones with no sign of wall thickening.   Pre-operative Diagnosis: Calculus of gallbladder with other cholecystitis, without mention of obstruction  Post-operative Diagnosis: Same  Surgeon: Wynona Luna   Assistants: Berenda Morale, RNFA  Anesthesia: General endotracheal anesthesia  ASA Class: 2  Procedure Details  The patient was seen again in the Holding Room. The risks, benefits, complications, treatment options, and expected outcomes were discussed with the patient. The possibilities of reaction to medication, pulmonary aspiration, perforation of viscus, bleeding, recurrent infection, finding a normal gallbladder, the need for additional procedures, failure to diagnose a condition, the possible need to convert to an open procedure, and creating a complication requiring transfusion or operation were discussed with the patient. The likelihood of improving the patient's symptoms with return to their baseline status is good.  The patient and/or family concurred with the proposed plan, giving informed consent. The site of surgery properly noted. The patient was taken to Operating Room, identified as Kelsey Santana and the procedure verified as Laparoscopic Cholecystectomy with Intraoperative Cholangiogram. A Time Out was held and the above information confirmed.  Prior to the induction of general anesthesia, antibiotic prophylaxis was administered. General endotracheal anesthesia was then administered and tolerated well. After the  induction, the abdomen was prepped with Chloraprep and draped in the sterile fashion. The patient was positioned in the supine position.  Local anesthetic agent was injected into the skin above the umbilicus and an incision made. We dissected down to the abdominal fascia with blunt dissection.  The fascia was incised vertically and we entered the peritoneal cavity bluntly.  A pursestring suture of 0-Vicryl was placed around the fascial opening.  The Hasson cannula was inserted and secured with the stay suture.  Pneumoperitoneum was then created with CO2 and tolerated well without any adverse changes in the patient's vital signs. An 11-mm port was placed in the subxiphoid position.  Two 5-mm ports were placed in the right upper quadrant. All skin incisions were infiltrated with a local anesthetic agent before making the incision and placing the trocars.   We positioned the patient in reverse Trendelenburg, tilted slightly to the patient's left.  The gallbladder was identified, the fundus grasped and retracted cephalad. There were minimal adhesions to the gallbladder.  Adhesions were lysed bluntly and with the electrocautery where indicated, taking care not to injure any adjacent organs or viscus. The infundibulum was grasped and retracted laterally, exposing the peritoneum overlying the triangle of Calot. This was then divided and exposed in a blunt fashion. A critical view of the cystic duct and cystic artery was obtained.  The cystic duct was clearly identified and bluntly dissected circumferentially. The cystic duct was ligated with a clip distally.   An incision was made in the cystic duct and the [facility] cholangiogram catheter introduced. The catheter was secured using a clip. A cholangiogram was then obtained which showed good visualization of the distal and proximal biliary tree with no sign of filling defects or obstruction.  Contrast flowed easily into the duodenum. The catheter was then removed.   The  cystic duct was then ligated  with clips and divided. The cystic artery was identified, dissected free, ligated with clips and divided as well.   The gallbladder was dissected from the liver bed in retrograde fashion with the electrocautery. The gallbladder was removed and placed in a retrieval sac. The liver bed was irrigated and inspected. Hemostasis was achieved with the electrocautery. Copious irrigation was utilized and was repeatedly aspirated until clear.  The gallbladder and retrieval sac were then removed through the umbilical port site.  The pursestring suture was used to close the umbilical fascia.    We again inspected the right upper quadrant for hemostasis.  Pneumoperitoneum was released as we removed the trocars.  4-0 Monocryl was used to close the skin.   Benzoin, steri-strips, and clean dressings were applied. The patient was then extubated and brought to the recovery room in stable condition. Instrument, sponge, and needle counts were correct at closure and at the conclusion of the case.   Findings: Cholecystitis with Cholelithiasis  Estimated Blood Loss: Minimal         Drains: none         Specimens: Gallbladder           Complications: None; patient tolerated the procedure well.         Disposition: PACU - hemodynamically stable.         Condition: stable  Kelsey Santana. Kelsey Skains, MD, [facility]  General Surgery   10/03/2023 9:38 AM

## 2023-10-03 NOTE — Transfer of Care (Signed)
Immediate Anesthesia Transfer of Care Note  Patient: Kelsey Santana  Procedure(s) Performed: LAPAROSCOPIC CHOLECYSTECTOMY (Abdomen) INTRAOPERATIVE CHOLANGIOGRAM (Abdomen)  Patient Location: PACU  Anesthesia Type:General  Level of Consciousness: awake, alert , oriented, and patient cooperative  Airway & Oxygen Therapy: Patient Spontanous Breathing and Patient connected to nasal cannula oxygen  Post-op Assessment: Report given to RN and Post -op Vital signs reviewed and stable  Post vital signs: Reviewed and stable  Last Vitals:  Vitals Value Taken Time  BP 156/77 10/03/23 0948  Temp 36.7 C 10/03/23 0948  Pulse 86 10/03/23 0950  Resp 17 10/03/23 0950  SpO2 97 % 10/03/23 0950  Vitals shown include unfiled device data.  Last Pain:  Vitals:   10/03/23 0724  TempSrc:   PainSc: 0-No pain         Complications: No notable events documented.

## 2023-10-03 NOTE — H&P (Signed)
History of Present Illness: Kelsey Santana is a 63 y.o. female who is seen today as an office consultation at the request of Dr. Gwendalyn Ege for evaluation of New Consultation .   This is a 64 year old female who presents with a 75-month history of intermittent upper abdominal pain associated with nausea, vomiting, and diarrhea.  This tends to occur after eating, especially fatty foods.  The patient underwent a very thorough evaluation including colonoscopy and EGD.  These did not show any findings that would explain her symptoms.  She had an ultrasound that showed multiple gallstones with no sign of wall thickening.  The patient continues to have intermittent symptoms although these have improved since limiting the amount of fat in her diet.  The patient is on Ozempic.     Review of Systems: A complete review of systems was obtained from the patient.  I have reviewed this information and discussed as appropriate with the patient.  See HPI as well for other ROS.   Review of Systems  Constitutional: Negative.   HENT: Negative.    Eyes: Negative.   Respiratory: Negative.    Cardiovascular: Negative.   Gastrointestinal:  Positive for abdominal pain, diarrhea, nausea and vomiting.  Genitourinary: Negative.   Musculoskeletal:  Positive for back pain and joint pain.  Skin: Negative.   Neurological:  Positive for headaches.  Endo/Heme/Allergies: Negative.   Psychiatric/Behavioral: Negative.          Medical History:     Past Medical History:  Diagnosis Date   GERD (gastroesophageal reflux disease)     Hyperlipidemia     Hypertension           Patient Active Problem List  Diagnosis   Adenomatous polyp of colon   Depression   Dysphagia   Epigastric pain   Gastro-esophageal reflux disease without esophagitis   History of hysterectomy for benign disease   HLD (hyperlipidemia)   Essential (primary) hypertension   Low back pain   Migraine headache   Morbid obesity due to excess calories  (CMS/HHS-HCC)   Vitamin D deficiency           Past Surgical History:  Procedure Laterality Date   HYSTERECTOMY               Allergies  Allergen Reactions   Ciprofloxacin Itching and Nausea      itching   Ace Inhibitors Cough      Only some cause coughing   Hydrocortisone Itching and Nausea            Current Outpatient Medications on File Prior to Visit  Medication Sig Dispense Refill   amLODIPine (NORVASC) 5 MG tablet Take 5 mg by mouth once daily       HYDROcodone-acetaminophen (NORCO) 10-325 mg tablet Take 1 tablet by mouth 4 (four) times daily as needed       losartan (COZAAR) 25 MG tablet Take 25 mg by mouth once daily       metFORMIN (GLUCOPHAGE) 500 MG tablet Take 1 tablet by mouth once daily       OZEMPIC 2 mg/dose (8 mg/3 mL) pen injector INJECT 2MG  INTO THE SKIN ONCE A WEEK       topiramate (TOPAMAX) 25 MG tablet Take 25 mg by mouth 2 (two) times daily        No current facility-administered medications on file prior to visit.           Family History  Problem Relation Age of Onset   High  blood pressure (Hypertension) Mother     Hyperlipidemia (Elevated cholesterol) Mother     Coronary Artery Disease (Blocked arteries around heart) Mother     Diabetes Mother        Social History        Tobacco Use  Smoking Status Former   Types: Cigarettes  Smokeless Tobacco Never      Social History         Socioeconomic History   Marital status: Single  Tobacco Use   Smoking status: Former      Types: Cigarettes   Smokeless tobacco: Never  Substance and Sexual Activity   Alcohol use: Not Currently   Drug use: Not Currently    Social Drivers of Health      Received from [employer]    Social Network      Objective:         Vitals:     BP: 130/78  Pulse: (!) 120  Temp: 36.8 C (98.3 F)  SpO2: 94%  Weight: (!) 120.7 kg (266 lb)  Height: 162.6 cm (5\' 4" )    Body mass index is 45.66 kg/m.   Physical Exam    Constitutional:  WDWN in  NAD, conversant, no obvious deformities; lying in bed comfortably Eyes:  Pupils equal, round; sclera anicteric; moist conjunctiva; no lid lag HENT:  Oral mucosa moist; good dentition  Neck:  No masses palpated, trachea midline; no thyromegaly Lungs:  CTA bilaterally; normal respiratory effort CV:  Regular rate and rhythm; no murmurs; extremities well-perfused with no edema Abd:  +bowel sounds, soft, obese, mildly tender in the epigastrium and right upper quadrant, no palpable organomegaly; no palpable hernias Musc: Normal gait; no apparent clubbing or cyanosis in extremities Lymphatic:  No palpable cervical or axillary lymphadenopathy Skin:  Warm, dry; no sign of jaundice Psychiatric - alert and oriented x 4; calm mood and affect     Assessment and Plan:  Diagnoses and all orders for this visit:   Calculus of gallbladder with chronic cholecystitis without obstruction   Recommend laparoscopic cholecystectomy with intraoperative cholangiogram.The surgical procedure has been discussed with the patient.  Potential risks, benefits, alternative treatments, and expected outcomes have been explained.  All of the patient's questions at this time have been answered.  The likelihood of reaching the patient's treatment goal is good.  The patient understands the proposed surgical procedure and wishes to proceed.  [name]. [name], MD, [facility] Surgery  General Surgery   10/03/2023 8:12 AM

## 2023-10-03 NOTE — Anesthesia Postprocedure Evaluation (Signed)
Anesthesia Post Note  Patient: Kelsey Santana  Procedure(s) Performed: LAPAROSCOPIC CHOLECYSTECTOMY (Abdomen) INTRAOPERATIVE CHOLANGIOGRAM (Abdomen)     Patient location during evaluation: PACU Anesthesia Type: General Level of consciousness: awake and alert Pain management: pain level controlled Vital Signs Assessment: post-procedure vital signs reviewed and stable Respiratory status: spontaneous breathing, nonlabored ventilation and respiratory function stable Cardiovascular status: blood pressure returned to baseline and stable Postop Assessment: no apparent nausea or vomiting Anesthetic complications: no   No notable events documented.  Last Vitals:  Vitals:   10/03/23 1003 10/03/23 1018  BP: (!) 140/61 (!) 155/80  Pulse: 64 60  Resp: 15 15  Temp:  36.7 C  SpO2: 100% 98%    Last Pain:  Vitals:   10/03/23 1015  TempSrc:   PainSc: 4                  Lowella Curb

## 2023-10-03 NOTE — Anesthesia Preprocedure Evaluation (Signed)
Anesthesia Evaluation  Patient identified by MRN, date of birth, ID band Patient awake    Reviewed: Allergy & Precautions, NPO status , Patient's Chart, lab work & pertinent test results  Airway Mallampati: II  TM Distance: >3 FB Neck ROM: Full    Dental no notable dental hx.    Pulmonary neg pulmonary ROS, former smoker   Pulmonary exam normal breath sounds clear to auscultation       Cardiovascular hypertension, Pt. on medications Normal cardiovascular exam Rhythm:Regular Rate:Normal     Neuro/Psych  Headaches  Anxiety Depression     negative psych ROS   GI/Hepatic Neg liver ROS,GERD  ,,  Endo/Other    Class 3 obesity  Renal/GU negative Renal ROS  negative genitourinary   Musculoskeletal  (+) Arthritis , Osteoarthritis,    Abdominal  (+) + obese  Peds negative pediatric ROS (+)  Hematology negative hematology ROS (+)   Anesthesia Other Findings   Reproductive/Obstetrics negative OB ROS                             Anesthesia Physical Anesthesia Plan  ASA: III  Anesthesia Plan: General   Post-op Pain Management: Tylenol PO (pre-op)*   Induction: Intravenous  PONV Risk Score and Plan: 3 and Ondansetron, Dexamethasone, Midazolam and Treatment may vary due to age or medical condition  Airway Management Planned: Oral ETT  Additional Equipment:   Intra-op Plan:   Post-operative Plan: Extubation in OR  Informed Consent: I have reviewed the patients History and Physical, chart, labs and discussed the procedure including the risks, benefits and alternatives for the proposed anesthesia with the patient or authorized representative who has indicated his/her understanding and acceptance.     Dental advisory given  Plan Discussed with: CRNA and Surgeon  Anesthesia Plan Comments:         Anesthesia Quick Evaluation

## 2023-10-03 NOTE — Discharge Instructions (Signed)
[facility] ______[facility] LAPAROSCOPIC SURGERY: POST OP INSTRUCTIONS Always review your discharge instruction sheet given to you by the facility where your surgery was performed. IF YOU HAVE DISABILITY OR FAMILY LEAVE FORMS, YOU MUST BRING THEM TO THE OFFICE FOR PROCESSING.   DO NOT GIVE THEM TO YOUR DOCTOR.  A prescription for pain medication may be given to you upon discharge.  Take your pain medication as prescribed, if needed.  If narcotic pain medicine is not needed, then you may take acetaminophen (Tylenol) or ibuprofen (Advil) as needed. Take your usually prescribed medications unless otherwise directed. If you need a refill on your pain medication, please contact your pharmacy.  They will contact our office to request authorization. Prescriptions will not be filled after 5pm or on week-ends. You should follow a light diet the first few days after arrival home, such as soup and crackers, etc.  Be sure to include lots of fluids daily. Most patients will experience some swelling and bruising in the area of the incisions.  Ice packs will help.  Swelling and bruising can take several days to resolve.  It is common to experience some constipation if taking pain medication after surgery.  Increasing fluid intake and taking a stool softener (such as Colace) will usually help or prevent this problem from occurring.  A mild laxative (Milk of Magnesia or Miralax) should be taken according to package instructions if there are no bowel movements after 48 hours. Unless discharge instructions indicate otherwise, you may remove your bandages 24-48 hours after surgery, and you may shower at that time.  You may have steri-strips (small skin tapes) in place directly over the incision.  These strips should be left on the skin for 7-10 days.  If your surgeon used skin glue on the incision, you may shower in 24 hours.  The glue will flake off over the next 2-3 weeks.  Any sutures or staples will be  removed at the office during your follow-up visit. ACTIVITIES:  You may resume regular (light) daily activities beginning the next day--such as daily self-care, walking, climbing stairs--gradually increasing activities as tolerated.  You may have sexual intercourse when it is comfortable.  Refrain from any heavy lifting or straining until approved by your doctor. You may drive when you are no longer taking prescription pain medication, you can comfortably wear a seatbelt, and you can safely maneuver your car and apply brakes. RETURN TO WORK:  __________________________________________________________ Kelsey Santana should see your doctor in the office for a follow-up appointment approximately 2-3 weeks after your surgery.  Make sure that you call for this appointment within a day or two after you arrive home to insure a convenient appointment time. OTHER INSTRUCTIONS: __________________________________________________________________________________________________________________________ __________________________________________________________________________________________________________________________ WHEN TO CALL YOUR DOCTOR: Fever over 101.0 Inability to urinate Continued bleeding from incision. Increased pain, redness, or drainage from the incision. Increasing abdominal pain  The clinic staff is available to answer your questions during regular business hours.  Please don't hesitate to call and ask to speak to one of the nurses for clinical concerns.  If you have a medical emergency, go to the nearest emergency room or call 911.  A surgeon from [facility] is always on call at the hospital. [address], [address], [place], [place]  [address] ? [address] [address], [place], [place]   [address] [phone] ? [phone] ? FAX [phone] Web site: [email or web address]

## 2023-10-03 NOTE — Anesthesia Procedure Notes (Signed)
Procedure Name: Intubation Date/Time: 10/03/2023 8:40 AM  Performed by: Ammie Dalton, CRNAPre-anesthesia Checklist: Patient identified, Emergency Drugs available, Suction available and Patient being monitored Patient Re-evaluated:Patient Re-evaluated prior to induction Oxygen Delivery Method: Circle System Utilized Preoxygenation: Pre-oxygenation with 100% oxygen Induction Type: IV induction Ventilation: Mask ventilation without difficulty Laryngoscope Size: Mac and 3 Grade View: Grade I Tube type: Oral Number of attempts: 1 Airway Equipment and Method: Stylet and Oral airway Placement Confirmation: ETT inserted through vocal cords under direct vision, positive ETCO2 and breath sounds checked- equal and bilateral Secured at: 21 cm Tube secured with: Tape Dental Injury: Teeth and Oropharynx as per pre-operative assessment

## 2023-10-04 ENCOUNTER — Encounter (HOSPITAL_COMMUNITY): Payer: Self-pay | Admitting: Surgery

## 2023-10-04 LAB — SURGICAL PATHOLOGY

## 2023-10-09 NOTE — Progress Notes (Deleted)
 Subjective:    Patient ID: Kelsey Santana, female    DOB: September 23, 1960, 63 y.o.   MRN: [id number]  HPI   Pain Inventory Average Pain {NUMBERS; 0-10:5044} Pain Right Now {NUMBERS; 0-10:5044} My pain is {PAIN DESCRIPTION:21022940}  In the last 24 hours, has pain interfered with the following? General activity {NUMBERS; 0-10:5044} Relation with others {NUMBERS; 0-10:5044} Enjoyment of life {NUMBERS; 0-10:5044} What TIME of day is your pain at its worst? {time of day:24191} Sleep (in general) {BHH GOOD/FAIR/POOR:22877}  Pain is worse with: {ACTIVITIES:21022942} Pain improves with: {PAIN IMPROVES EXBM:84132440} Relief from Meds: {NUMBERS; 0-10:5044}  Family History  Problem Relation Age of Onset   Alcohol abuse Mother    Diabetes Mother    Hyperlipidemia Mother    Hypertension Mother    COPD Mother    Diabetes Brother    Hyperlipidemia Brother    Hypertension Brother    Depression Brother    Stroke Brother    Thyroid disease Neg Hx    Social History   Socioeconomic History   Marital status: Single    Spouse name: Not on file   Number of children: Not on file   Years of education: Not on file   Highest education level: Not on file  Occupational History   Not on file  Tobacco Use   Smoking status: Former   Smokeless tobacco: Former   Tobacco comments:    quit 28 yrs ago  Psychologist, educational Use   Vaping status: Never Used  Substance and Sexual Activity   Alcohol use: No   Drug use: No   Sexual activity: Never  Other Topics Concern   Not on file  Social History Narrative   epworth sleepiness scale = 10 (11/15/15)    Lives alone in a 2 story home.  Has 3 children.  Currently not working.  Has lost 6 jobs in the past 2 months due to her leg numbness and pain.     Education: 2 years of college.   Social Drivers of Corporate investment banker Strain: Not on file  Food Insecurity: Not on file  Transportation Needs: Not on file  Physical Activity: Not on file  Stress:  Not on file  Social Connections: Unknown (01/02/2022)   Received from [facility]   Social Network    Social Network: Not on file   Past Surgical History:  Procedure Laterality Date   ABDOMINAL HYSTERECTOMY     complete   CHOLECYSTECTOMY N/A 10/03/2023   Procedure: LAPAROSCOPIC CHOLECYSTECTOMY;  Surgeon: Manus Rudd, MD;  Location: [facility] OR;  Service: General;  Laterality: N/A;  Assit: RNFA   COLONOSCOPY N/A 01/16/2017   Procedure: COLONOSCOPY;  Surgeon: Jeani Hawking, MD;  Location: [facility] ENDOSCOPY;  Service: Endoscopy;  Laterality: N/A;   ESOPHAGOGASTRODUODENOSCOPY N/A 01/16/2017   Procedure: ESOPHAGOGASTRODUODENOSCOPY (EGD);  Surgeon: Jeani Hawking, MD;  Location: [facility];  Service: Endoscopy;  Laterality: N/A;   INTRAOPERATIVE CHOLANGIOGRAM N/A 10/03/2023   Procedure: INTRAOPERATIVE CHOLANGIOGRAM;  Surgeon: Manus Rudd, MD;  Location: [facility] OR;  Service: General;  Laterality: N/A;   Past Surgical History:  Procedure Laterality Date   ABDOMINAL HYSTERECTOMY     complete   CHOLECYSTECTOMY N/A 10/03/2023   Procedure: LAPAROSCOPIC CHOLECYSTECTOMY;  Surgeon: Manus Rudd, MD;  Location: [facility] OR;  Service: General;  Laterality: N/A;  Assit: RNFA   COLONOSCOPY N/A 01/16/2017   Procedure: COLONOSCOPY;  Surgeon: Jeani Hawking, MD;  Location: [facility] END[facility]COPY;  Service: Endoscopy;  Laterality: N/A;   ESOPHAGOGASTRODUODENOSCOPY N/A 01/16/2017  Procedure: ESOPHAGOGASTRODUODENOSCOPY (EGD);  Surgeon: Jeani Hawking, MD;  Location: [facility];  Service: Endoscopy;  Laterality: N/A;   INTRAOPERATIVE CHOLANGIOGRAM N/A 10/03/2023   Procedure: INTRAOPERATIVE CHOLANGIOGRAM;  Surgeon: Manus Rudd, MD;  Location: [facility] OR;  Service: General;  Laterality: N/A;   Past Medical History:  Diagnosis Date   Anxiety    Arthritis    Back pain    l 4 and l5 djd   Depression    Fatty liver    GERD (gastroesophageal reflux disease)    Hypercholesteremia    Hypertension    Migraine     Pre-diabetes    There were no vitals taken for this visit.  Opioid Risk Score:   Fall Risk Score:  `1  Depression screen [facility] 2/9     08/27/2023    1:04 PM 08/09/2023    8:41 AM 06/14/2023    9:51 AM 06/05/2023    9:24 AM 05/11/2023    9:14 AM 04/12/2023   11:13 AM 03/20/2023    9:40 AM  Depression screen [facility] 2/9  Decreased Interest 0 0 1 0 0 1 1  Down, Depressed, Hopeless 0 0 1 0 0 1 1  PHQ - 2 Score 0 0 2 0 0 2 2    Review of Systems     Objective:   Physical Exam        Assessment & Plan:

## 2023-10-11 ENCOUNTER — Encounter: Payer: 59 | Admitting: Registered Nurse

## 2023-10-16 NOTE — Progress Notes (Unsigned)
 Subjective:    Patient ID: Kelsey Santana, female    DOB: [birth year], 63 y.o.   MRN: [id number]  HPI: Kelsey Santana is a 63 y.o. female who returns for follow up appointment for chronic pain and medication refill. She states she's not having pain right now. She reports she awaken with a slight headache this morning and post surgical abdominal discomfort.  She rates her pain 0. Her current exercise regime is walking.  Kelsey Santana arrived Bradycardic, apical pulse checked. PCP  following.   Kelsey Santana underwent on 10/02/2022: Dr. Lynann Bologna LAPAROSCOPIC CHOLECYSTECTOMY N/A General  Assit: RNFA    INTRAOPERATIVE CHOLANGIOGRAM      Kelsey Santana Morphine equivalent is 40.00 MME.   Last UDS was Performed on 08/09/2023, it was consistent.    Pain Inventory Average Pain 10 Pain Right Now 0 My pain is sharp and stabbing  In the last 24 hours, has pain interfered with the following? General activity 0 Relation with others 0 Enjoyment of life 0 What TIME of day is your pain at its worst? night Sleep (in general) Fair  Pain is worse with: bending and moving Pain improves with: medication Relief from Meds: 9  Family History  Problem Relation Age of Onset   Alcohol abuse Mother    Diabetes Mother    Hyperlipidemia Mother    Hypertension Mother    COPD Mother    Diabetes Brother    Hyperlipidemia Brother    Hypertension Brother    Depression Brother    Stroke Brother    Thyroid disease Neg Hx    Social History   Socioeconomic History   Marital status: Single    Spouse name: Not on file   Number of children: Not on file   Years of education: Not on file   Highest education level: Not on file  Occupational History   Not on file  Tobacco Use   Smoking status: Former   Smokeless tobacco: Former   Tobacco comments:    quit 28 yrs ago  Psychologist, educational Use   Vaping status: Never Used  Substance and Sexual Activity   Alcohol use: No   Drug use: No   Sexual activity: Never  Other  Topics Concern   Not on file  Social History Narrative   epworth sleepiness scale = 10 (11/15/15)    Lives alone in a 2 story home.  Has 3 children.  Currently not working.  Has lost 6 jobs in the past 2 months due to her leg numbness and pain.     Education: 2 years of college.   Social Drivers of Corporate investment banker Strain: Not on file  Food Insecurity: Not on file  Transportation Needs: Not on file  Physical Activity: Not on file  Stress: Not on file  Social Connections: Unknown (01/02/2022)   [facility]   Social Network    Social Network: Not on file   Past Surgical History:  Procedure Laterality Date   ABDOMINAL HYSTERECTOMY     complete   CHOLECYSTECTOMY N/A 10/03/2023   Procedure: LAPAROSCOPIC CHOLECYSTECTOMY;  Surgeon: Manus Rudd, MD;  Location: [facility] OR;  Service: General;  Laterality: N/A;  Assit: RNFA   COLONOSCOPY N/A 01/16/2017   Procedure: COLONOSCOPY;  Surgeon: Jeani Hawking, MD;  Location: [facility] ENDOSCOPY;  Service: Endoscopy;  Laterality: N/A;   ESOPHAGOGASTRODUODENOSCOPY N/A 01/16/2017   Procedure: ESOPHAGOGASTRODUODENOSCOPY (EGD);  Surgeon: Jeani Hawking, MD;  Location: [facility];  Service: Endoscopy;  Laterality: N/A;   INTRAOPERATIVE CHOLANGIOGRAM N/A 10/03/2023   Procedure: INTRAOPERATIVE CHOLANGIOGRAM;  Surgeon: Manus Rudd, MD;  Location: [facility] OR;  Service: General;  Laterality: N/A;   Past Surgical History:  Procedure Laterality Date   ABDOMINAL HYSTERECTOMY     complete   CHOLECYSTECTOMY N/A 10/03/2023   Procedure: LAPAROSCOPIC CHOLECYSTECTOMY;  Surgeon: Manus Rudd, MD;  Location: [facility] OR;  Service: General;  Laterality: N/A;  Assit: RNFA   COLONOSCOPY N/A 01/16/2017   Procedure: COLONOSCOPY;  Surgeon: Jeani Hawking, MD;  Location: [facility] ENDOSCOPY;  Service: Endoscopy;  Laterality: N/A;   ESOPHAGOGASTRODUODENOSCOPY N/A 01/16/2017   Procedure: ESOPHAGOGASTRODUODENOSCOPY (EGD);  Surgeon: Jeani Hawking, MD;  Location: [facility]  ENDOSCOPY;  Service: Endoscopy;  Laterality: N/A;   INTRAOPERATIVE CHOLANGIOGRAM N/A 10/03/2023   Procedure: INTRAOPERATIVE CHOLANGIOGRAM;  Surgeon: Manus Rudd, MD;  Location: [facility] OR;  Service: General;  Laterality: N/A;   Past Medical History:  Diagnosis Date   Anxiety    Arthritis    Back pain    l 4 and l5 djd   Depression    Fatty liver    GERD (gastroesophageal reflux disease)    Hypercholesteremia    Hypertension    Migraine    Pre-diabetes    BP 122/78   Pulse (!) 57   Ht 5\' 4"  (1.626 m)   Wt 267 lb (121.1 kg)   SpO2 99%   BMI 45.83 kg/m   Opioid Risk Score:   Fall Risk Score:  `1  Depression screen [facility] 2/9     08/27/2023    1:04 PM 08/09/2023    8:41 AM 06/14/2023    9:51 AM 06/05/2023    9:24 AM 05/11/2023    9:14 AM 04/12/2023   11:13 AM 03/20/2023    9:40 AM  Depression screen PHQ 2/9  Decreased Interest 0 0 1 0 0 1 1  Down, Depressed, Hopeless 0 0 1 0 0 1 1  PHQ - 2 Score 0 0 2 0 0 2 2    Review of Systems  Musculoskeletal:        Bilateral knee and hip pain  All other systems reviewed and are negative.      Objective:   Physical Exam Vitals and nursing note reviewed.  Constitutional:      Appearance: Normal appearance. She is obese.  Cardiovascular:     Rate and Rhythm: Normal rate and regular rhythm.     Pulses: Normal pulses.     Heart sounds: Normal heart sounds.  Musculoskeletal:     Comments: Normal Muscle Bulk and Muscle Testing Reveals:  Upper Extremities: Fu;ll ROM and Muscle Strength 5/5 Lumbar Paraspinal Tenderness: L-4-L-5 Lower Extremities : Full ROM and Muscle Strength 5/5 Left Lower Extremity Flexion Produces Pain into her Left Hip  Arises from Chair slowly Narrow Based Gait     Skin:    General: Skin is warm and dry.  Neurological:     Mental Status: She is alert and oriented to person, place, and time.  Psychiatric:        Mood and Affect: Mood normal.        Behavior: Behavior normal.         Assessment &  Plan:  Chronic Low Back Pain without Sciatica/ Right Lumbar Radiculitis: Continue Gabapentin .Continue HEP as Tolerated. Continue to monitor. 10/17/2023 Bilateral Greater Trochanter Tenderness: No complaints today.  Continue to Alternate Ice and Heat Therapy. Continue to Monitor. 10/17/2023 Bilateral Primary Osteoarthritis of Bilateral Knees: S/P Synvisc Injection with Dr  Raulkar on 03/20/2023, with good relief noted . Continue  HEP as Tolerated . Continue current medication regimen as prescribed. Continue to Monitor. 10/17/2023 Chronic Pain Syndrome: Refilled:Begin slow weaning, patient requests. Pain is being controlled., she reports.  Hydrocodone 10mg /325 one tablet 4 times a day as needed for pain. #120.  We will continue the opioid monitoring program, this consists of regular clinic visits, examinations, urine drug screen, pill counts as well as use of [place] Controlled Substance Reporting system. A 12 month History has been reviewed on the [place] Controlled Substance Reporting System on 10/17/2023 5.Morbid Obesity: Continue Healthy Diet Regimen and Continue with HEP as Tolerated. Marland KitchenPCP Following.  10/17/2023 Migraine without Aura: No Complaints today. Continue current medication regimen. Continue to Monitor. 10/17/2023.     F/U in 1 month

## 2023-10-17 ENCOUNTER — Encounter: Payer: Self-pay | Admitting: Registered Nurse

## 2023-10-17 ENCOUNTER — Encounter: Payer: 59 | Attending: Physical Medicine and Rehabilitation | Admitting: Registered Nurse

## 2023-10-17 VITALS — BP 122/78 | HR 64 | Ht 64.0 in | Wt 267.0 lb

## 2023-10-17 DIAGNOSIS — Z5181 Encounter for therapeutic drug level monitoring: Secondary | ICD-10-CM | POA: Insufficient documentation

## 2023-10-17 DIAGNOSIS — G43009 Migraine without aura, not intractable, without status migrainosus: Secondary | ICD-10-CM | POA: Diagnosis present

## 2023-10-17 DIAGNOSIS — M47816 Spondylosis without myelopathy or radiculopathy, lumbar region: Secondary | ICD-10-CM | POA: Insufficient documentation

## 2023-10-17 DIAGNOSIS — Z79891 Long term (current) use of opiate analgesic: Secondary | ICD-10-CM | POA: Diagnosis present

## 2023-10-17 DIAGNOSIS — G894 Chronic pain syndrome: Secondary | ICD-10-CM | POA: Diagnosis not present

## 2023-10-17 MED ORDER — HYDROCODONE-ACETAMINOPHEN 10-325 MG PO TABS: 1.0000 | ORAL_TABLET | Freq: Four times a day (QID) | ORAL | 0 refills | Status: DC | PRN

## 2023-10-29 ENCOUNTER — Encounter: Payer: 59 | Admitting: Bariatrics

## 2023-11-12 NOTE — Progress Notes (Deleted)
 Subjective:    Patient ID: Kelsey Santana, female    DOB: September 23, 1960, 63 y.o.   MRN: [id number]  HPI   Pain Inventory Average Pain {NUMBERS; 0-10:5044} Pain Right Now {NUMBERS; 0-10:5044} My pain is {PAIN DESCRIPTION:21022940}  In the last 24 hours, has pain interfered with the following? General activity {NUMBERS; 0-10:5044} Relation with others {NUMBERS; 0-10:5044} Enjoyment of life {NUMBERS; 0-10:5044} What TIME of day is your pain at its worst? {time of day:24191} Sleep (in general) {BHH GOOD/FAIR/POOR:22877}  Pain is worse with: {ACTIVITIES:21022942} Pain improves with: {PAIN IMPROVES EXBM:84132440} Relief from Meds: {NUMBERS; 0-10:5044}  Family History  Problem Relation Age of Onset   Alcohol abuse Mother    Diabetes Mother    Hyperlipidemia Mother    Hypertension Mother    COPD Mother    Diabetes Brother    Hyperlipidemia Brother    Hypertension Brother    Depression Brother    Stroke Brother    Thyroid disease Neg Hx    Social History   Socioeconomic History   Marital status: Single    Spouse name: Not on file   Number of children: Not on file   Years of education: Not on file   Highest education level: Not on file  Occupational History   Not on file  Tobacco Use   Smoking status: Former   Smokeless tobacco: Former   Tobacco comments:    quit 28 yrs ago  Psychologist, educational Use   Vaping status: Never Used  Substance and Sexual Activity   Alcohol use: No   Drug use: No   Sexual activity: Never  Other Topics Concern   Not on file  Social History Narrative   epworth sleepiness scale = 10 (11/15/15)    Lives alone in a 2 story home.  Has 3 children.  Currently not working.  Has lost 6 jobs in the past 2 months due to her leg numbness and pain.     Education: 2 years of college.   Social Drivers of Corporate investment banker Strain: Not on file  Food Insecurity: Not on file  Transportation Needs: Not on file  Physical Activity: Not on file  Stress:  Not on file  Social Connections: Unknown (01/02/2022)   Received from [facility]   Social Network    Social Network: Not on file   Past Surgical History:  Procedure Laterality Date   ABDOMINAL HYSTERECTOMY     complete   CHOLECYSTECTOMY N/A 10/03/2023   Procedure: LAPAROSCOPIC CHOLECYSTECTOMY;  Surgeon: Manus Rudd, MD;  Location: [facility] OR;  Service: General;  Laterality: N/A;  Assit: RNFA   COLONOSCOPY N/A 01/16/2017   Procedure: COLONOSCOPY;  Surgeon: Jeani Hawking, MD;  Location: [facility] ENDOSCOPY;  Service: Endoscopy;  Laterality: N/A;   ESOPHAGOGASTRODUODENOSCOPY N/A 01/16/2017   Procedure: ESOPHAGOGASTRODUODENOSCOPY (EGD);  Surgeon: Jeani Hawking, MD;  Location: [facility];  Service: Endoscopy;  Laterality: N/A;   INTRAOPERATIVE CHOLANGIOGRAM N/A 10/03/2023   Procedure: INTRAOPERATIVE CHOLANGIOGRAM;  Surgeon: Manus Rudd, MD;  Location: [facility] OR;  Service: General;  Laterality: N/A;   Past Surgical History:  Procedure Laterality Date   ABDOMINAL HYSTERECTOMY     complete   CHOLECYSTECTOMY N/A 10/03/2023   Procedure: LAPAROSCOPIC CHOLECYSTECTOMY;  Surgeon: Manus Rudd, MD;  Location: [facility] OR;  Service: General;  Laterality: N/A;  Assit: RNFA   COLONOSCOPY N/A 01/16/2017   Procedure: COLONOSCOPY;  Surgeon: Jeani Hawking, MD;  Location: [facility] END[facility]COPY;  Service: Endoscopy;  Laterality: N/A;   ESOPHAGOGASTRODUODENOSCOPY N/A 01/16/2017  Procedure: ESOPHAGOGASTRODUODENOSCOPY (EGD);  Surgeon: Jeani Hawking, MD;  Location: [facility];  Service: Endoscopy;  Laterality: N/A;   INTRAOPERATIVE CHOLANGIOGRAM N/A 10/03/2023   Procedure: INTRAOPERATIVE CHOLANGIOGRAM;  Surgeon: Manus Rudd, MD;  Location: [facility] OR;  Service: General;  Laterality: N/A;   Past Medical History:  Diagnosis Date   Anxiety    Arthritis    Back pain    l 4 and l5 djd   Depression    Fatty liver    GERD (gastroesophageal reflux disease)    Hypercholesteremia    Hypertension    Migraine     Pre-diabetes    There were no vitals taken for this visit.  Opioid Risk Score:   Fall Risk Score:  `1  Depression screen [facility] 2/9     08/27/2023    1:04 PM 08/09/2023    8:41 AM 06/14/2023    9:51 AM 06/05/2023    9:24 AM 05/11/2023    9:14 AM 04/12/2023   11:13 AM 03/20/2023    9:40 AM  Depression screen [facility] 2/9  Decreased Interest 0 0 1 0 0 1 1  Down, Depressed, Hopeless 0 0 1 0 0 1 1  PHQ - 2 Score 0 0 2 0 0 2 2    Review of Systems     Objective:   Physical Exam        Assessment & Plan:

## 2023-11-14 ENCOUNTER — Encounter: Payer: 59 | Admitting: Registered Nurse

## 2023-11-15 ENCOUNTER — Ambulatory Visit

## 2023-11-15 ENCOUNTER — Telehealth: Payer: Self-pay

## 2023-11-15 VITALS — Ht 64.0 in | Wt 265.0 lb

## 2023-11-15 DIAGNOSIS — Z0000 Encounter for general adult medical examination without abnormal findings: Secondary | ICD-10-CM

## 2023-11-15 NOTE — Telephone Encounter (Signed)
 Patient completed AWV-Initial and stated that she is in need of the following:  Rx for shower chair, Elevated toilet seat, refill on Metformin and Vitamin D to be sent to pharmacy on file.  Patient c/o Ozempic not working anymore.  Patient stated that she has gained more weight (before was 239 lbs) and her knees have started to bothr her again.  Please advise.  Staysha Truby N. Reymundo Poll, LPN [facility] Annual Wellness Team Direct Dial: [phone]

## 2023-11-15 NOTE — Progress Notes (Signed)
 Because this visit was a virtual/telehealth visit,  certain criteria was not obtained, such a blood pressure, CBG if applicable, and timed get up and go. Any medications not marked as "taking" were not mentioned during the medication reconciliation part of the visit. Any vitals not documented were not able to be obtained due to this being a telehealth visit or patient was unable to self-report a recent blood pressure reading due to a lack of equipment at home via telehealth. Vitals that have been documented are verbally provided by the patient.   Subjective:   Kelsey Santana is a 63 y.o. who presents for a Medicare Wellness preventive visit.  Visit Complete: Virtual I connected with  Sherre Lain on 11/15/23 by a video and audio enabled telemedicine application and verified that I am speaking with the correct person using two identifiers.  Patient Location: Home  Provider Location: Office/Clinic  I discussed the limitations of evaluation and management by telemedicine. The patient expressed understanding and agreed to proceed.  Vital Signs: Because this visit was a virtual/telehealth visit, some criteria may be missing or patient reported. Any vitals not documented were not able to be obtained and vitals that have been documented are patient reported.  Persons Participating in Visit: Patient.  AWV Questionnaire: No: Patient Medicare AWV questionnaire was not completed prior to this visit.  Cardiac Risk Factors include: advanced age (>57men, >76 women);dyslipidemia;family history of premature cardiovascular disease;hypertension;obesity (BMI >30kg/m2)     Objective:    Today's Vitals   11/15/23 1110  Weight: 265 lb (120.2 kg)  Height: 5\' 4"  (1.626 m)  PainSc: 0-No pain   Body mass index is 45.49 kg/m.     11/15/2023   11:16 AM 09/27/2023   11:10 AM 08/10/2023   10:35 AM 12/22/2022    9:19 AM 09/25/2022    9:52 AM 04/04/2022   11:02 AM 11/17/2021    9:54 AM  Advanced  Directives  Does Patient Have a Medical Advance Directive? No No No No No No No  Would patient like information on creating a medical advance directive? No - Patient declined No - Patient declined   No - Patient declined      Current Medications (verified) Outpatient Encounter Medications as of 11/15/2023  Medication Sig   amLODipine (NORVASC) 5 MG tablet Take 1 tablet (5 mg total) by mouth daily.   ascorbic acid (VITAMIN C) 500 MG tablet Take 500 mg by mouth daily.   atorvastatin (LIPITOR) 40 MG tablet Take 40 mg by mouth every evening.   Collagen-Vitamin C-Biotin (COLLAGEN PO) Take 1 Scoop by mouth daily.   dihydroergotamine (MIGRANAL) 4 MG/ML nasal spray INSTILL ONE SPRAY IN NOSTRIL AS DIRECTED AS NEEDED FOR MIGRAINE (NO MORE THAN 4 SPRAYS IN 1 HOUR) (BULK)   HYDROcodone-acetaminophen (NORCO) 10-325 MG tablet Take 1 tablet by mouth every 6 (six) hours as needed for severe pain (pain score 7-10).   lidocaine (XYLOCAINE) 5 % ointment Apply 1 application topically See admin instructions. APPLY TO THIGH DAILY AS NEEDED FOR PAIN   losartan (COZAAR) 25 MG tablet Take 25 mg by mouth daily.   metFORMIN (GLUCOPHAGE) 500 MG tablet TAKE ONE TABLET (500MG  TOTAL) BY MOUTH DAILY AT 9AM WITH BREAKFAST   Misc Natural Products (ELDERBERRY IMMUNE COMPLEX PO) Take 1 tablet by mouth daily.   oxyCODONE (ROXICODONE) 5 MG immediate release tablet Take 1 tablet (5 mg total) by mouth every 6 (six) hours as needed (pain).   pantoprazole (PROTONIX) 20 MG tablet Take  1 tablet (20 mg total) by mouth daily.   Semaglutide, 2 MG/DOSE, (OZEMPIC, 2 MG/DOSE,) 8 MG/3ML SOPN Inject 1 each into the skin once a week.   topiramate (TOPAMAX) 50 MG tablet Take 1 tablet (50 mg total) by mouth 2 (two) times daily.   triamcinolone cream (KENALOG) 0.1 % Apply 1 Application topically 2 (two) times daily. To affected area till better   Vitamin D, Ergocalciferol, (DRISDOL) 1.25 MG (50000 UNIT) CAPS capsule TAKE 1 CAPSULE BY MOUTH EVERY  MONDAY AT 9AM (EVERY 7 DAYS)   No facility-administered encounter medications on file as of 11/15/2023.    Allergies (verified) Ciprofloxacin, Hydrocortisone, and Ace inhibitors   History: Past Medical History:  Diagnosis Date   Anxiety    Arthritis    Back pain    l 4 and l5 djd   Depression    Fatty liver    GERD (gastroesophageal reflux disease)    Hypercholesteremia    Hypertension    Migraine    Pre-diabetes    Past Surgical History:  Procedure Laterality Date   ABDOMINAL HYSTERECTOMY     complete   CHOLECYSTECTOMY N/A 10/03/2023   Procedure: LAPAROSCOPIC CHOLECYSTECTOMY;  Surgeon: Manus Rudd, MD;  Location: [facility] OR;  Service: General;  Laterality: N/A;  Assit: RNFA   COLONOSCOPY N/A 01/16/2017   Procedure: COLONOSCOPY;  Surgeon: Jeani Hawking, MD;  Location: [facility];  Service: Endoscopy;  Laterality: N/A;   ESOPHAGOGASTRODUODENOSCOPY N/A 01/16/2017   Procedure: ESOPHAGOGASTRODUODENOSCOPY (EGD);  Surgeon: Jeani Hawking, MD;  Location: [facility];  Service: Endoscopy;  Laterality: N/A;   INTRAOPERATIVE CHOLANGIOGRAM N/A 10/03/2023   Procedure: INTRAOPERATIVE CHOLANGIOGRAM;  Surgeon: Manus Rudd, MD;  Location: [facility] OR;  Service: General;  Laterality: N/A;   Family History  Problem Relation Age of Onset   Alcohol abuse Mother    Diabetes Mother    Hyperlipidemia Mother    Hypertension Mother    COPD Mother    Diabetes Brother    Hyperlipidemia Brother    Hypertension Brother    Depression Brother    Stroke Brother    Thyroid disease Neg Hx    Social History   Socioeconomic History   Marital status: Single    Spouse name: Not on file   Number of children: Not on file   Years of education: Not on file   Highest education level: Not on file  Occupational History   Not on file  Tobacco Use   Smoking status: Former   Smokeless tobacco: Former   Tobacco comments:    quit 28 yrs ago  Vaping Use   Vaping status: Never Used  Substance and Sexual  Activity   Alcohol use: No   Drug use: No   Sexual activity: Never  Other Topics Concern   Not on file  Social History Narrative   epworth sleepiness scale = 10 (11/15/15)    Lives alone in a 2 story home.  Has 3 children.  Currently not working.  Has lost 6 jobs in the past 2 months due to her leg numbness and pain.     Education: 2 years of college.   Social Drivers of Corporate investment banker Strain: Low Risk  (11/15/2023)   Overall Financial Resource Strain (CARDIA)    Difficulty of Paying Living Expenses: Not hard at all  Food Insecurity: No Food Insecurity (11/15/2023)   Hunger Vital Sign    Worried About Running Out of Food in the Last Year: Never true    Ran  Out of Food in the Last Year: Never true  Transportation Needs: No Transportation Needs (11/15/2023)   PRAPARE - Administrator, Civil Service (Medical): No    Lack of Transportation (Non-Medical): No  Physical Activity: Insufficiently Active (11/15/2023)   Exercise Vital Sign    Days of Exercise per Week: 7 days    Minutes of Exercise per Session: 20 min  Stress: No Stress Concern Present (11/15/2023)   [employer] of Occupational Health - Occupational Stress Questionnaire    Feeling of Stress : Not at all  Social Connections: Unknown (11/15/2023)   Social Connection and Isolation Panel [NHANES]    Frequency of Communication with Friends and Family: More than three times a week    Frequency of Social Gatherings with Friends and Family: More than three times a week    Attends Religious Services: More than 4 times per year    Active Member of [employer] or Organizations: Yes    Attends Engineer, structural: More than 4 times per year    Marital Status: Not on file    Tobacco Counseling Counseling given: Not Answered Tobacco comments: quit 28 yrs ago    Clinical Intake:  Pre-visit preparation completed: Yes  Pain : No/denies pain Pain Score: 0-No pain     BMI - recorded:  45.49 Nutritional Status: BMI > 30  Obese Nutritional Risks: None Diabetes: No (PREDIABETES)  Lab Results  Component Value Date   HGBA1C 6.2 (H) 12/27/2020     How often do you need to have someone help you when you read instructions, pamphlets, or other written materials from your doctor or pharmacy?: 1 - Never What is the last grade level you completed in school?: 2 YEARS OF COLLEGE  Interpreter Needed?: No  Information entered by :: Nylah Butkus N. Antavious Spanos, LPN.   Activities of Daily Living     11/15/2023   11:23 AM 10/03/2023    7:24 AM  In your present state of health, do you have any difficulty performing the following activities:  Hearing? 0   Vision? 0   Difficulty concentrating or making decisions? 0   Walking or climbing stairs? 0   Dressing or bathing? 0   Doing errands, shopping? 0 0  Preparing Food and eating ? N   Using the Toilet? N   In the past six months, have you accidently leaked urine? N   Do you have problems with loss of bowel control? N   Managing your Medications? N   Managing your Finances? N   Housekeeping or managing your Housekeeping? N     Patient Care Team: Fortunato Curling, DO as PCP - General (Family Medicine) Rennis Golden Lisette Abu, MD as PCP - Cardiology (Cardiology) Norva Pavlov, OD as Consulting Physician (Optometry)  Indicate any recent Medical Services you may have received from other than [facility] providers in the past year (date may be approximate).     Assessment:   This is a routine wellness examination for Ketsia.  Hearing/Vision screen Hearing Screening - Comments:: Patient has hearing aids. Vision Screening - Comments:: Wears rx glasses - up to date with routine eye exams with [facility].    Goals Addressed             This Visit's Progress    Client understands the importance of follow-up with providers by attending scheduled visits         Depression Screen     11/15/2023   11:20 AM 08/27/2023  1:04 PM  08/09/2023    8:41 AM 06/14/2023    9:51 AM 06/05/2023    9:24 AM 05/11/2023    9:14 AM 04/12/2023   11:13 AM  PHQ 2/9 Scores  PHQ - 2 Score 0 0 0 2 0 0 2  PHQ- 9 Score 7          Fall Risk     11/15/2023   11:18 AM 08/27/2023    1:04 PM 08/09/2023    8:41 AM 07/05/2023   10:31 AM 06/14/2023    9:50 AM  Fall Risk   Falls in the past year? 1 1 0 1 1  Number falls in past yr: 1 1  1 1   Comment    07/2022 last fall   Injury with Fall? 1 0  0 0  Risk for fall due to : History of fall(s);Impaired balance/gait;Orthopedic patient History of fall(s)  History of fall(s);Impaired balance/gait History of fall(s);Impaired balance/gait  Follow up Falls prevention discussed;Falls evaluation completed        MEDICARE RISK AT HOME:  Medicare Risk at Home Any stairs in or around the home?: Yes (lives on the second floor) If so, are there any without handrails?: No Home free of loose throw rugs in walkways, pet beds, electrical cords, etc?: Yes Adequate lighting in your home to reduce risk of falls?: Yes Life alert?: No Use of a cane, walker or w/c?: Yes Grab bars in the bathroom?: No Shower chair or bench in shower?: No (need rx) Elevated toilet seat or a handicapped toilet?: No (need rx)  TIMED UP AND GO:  Was the test performed?  No  Cognitive Function: 6CIT completed    11/15/2023   11:20 AM  MMSE - Mini Mental State Exam  Not completed: Unable to complete        11/15/2023   11:20 AM  6CIT Screen  What Year? 0 points  What month? 0 points  What time? 0 points  Count back from 20 0 points  Months in reverse 0 points  Repeat phrase 0 points  Total Score 0 points    Immunizations Immunization History  Administered Date(s) Administered   Moderna Sars-Covid-2 Vaccination 12/09/2019, 01/06/2020    Screening Tests Health Maintenance  Topic Date Due   DTaP/Tdap/Td (1 - Tdap) Never done   Zoster Vaccines- Shingrix (1 of 2) Never done   Cervical Cancer Screening (HPV/Pap  Cotest)  Never done   MAMMOGRAM  06/08/2019   COVID-19 Vaccine (3 - Moderna risk series) 02/03/2020   Medicare Annual Wellness (AWV)  11/14/2024   Colonoscopy  05/07/2033   INFLUENZA VACCINE  Completed   Hepatitis C Screening  Completed   HIV Screening  Completed   HPV VACCINES  Aged Out    Health Maintenance  Health Maintenance Due  Topic Date Due   DTaP/Tdap/Td (1 - Tdap) Never done   Zoster Vaccines- Shingrix (1 of 2) Never done   Cervical Cancer Screening (HPV/Pap Cotest)  Never done   MAMMOGRAM  06/08/2019   COVID-19 Vaccine (3 - Moderna risk series) 02/03/2020   Health Maintenance Items Addressed: Yes Patient is due for the following: Shingrix, Dtap and Covid vaccines.  Patient is due for the following screenings: Mammogram.  Additional Screening:  Vision Screening: Recommended annual ophthalmology exams for early detection of glaucoma and other disorders of the eye.  Dental Screening: Recommended annual dental exams for proper oral hygiene  Community Resource Referral / Chronic Care Management: CRR required this visit?  No   CCM required this visit?  No     Plan:     I have personally reviewed and noted the following in the patient's chart:   Medical and social history Use of alcohol, tobacco or illicit drugs  Current medications and supplements including opioid prescriptions. Patient is currently taking opioid prescriptions. Information provided to patient regarding non-opioid alternatives. Patient advised to discuss non-opioid treatment plan with their provider. Functional ability and status Nutritional status Physical activity Advanced directives List of other physicians Hospitalizations, surgeries, and ER visits in previous 12 months Vitals Screenings to include cognitive, depression, and falls Referrals and appointments  In addition, I have reviewed and discussed with patient certain preventive protocols, quality metrics, and best practice  recommendations. A written personalized care plan for preventive services as well as general preventive health recommendations were provided to patient.     Mickeal Needy, LPN   11/27/8117   After Visit Summary: (MyChart) Due to this being a telephonic visit, the after visit summary with patients personalized plan was offered to patient via MyChart   Notes: Please refer to Routing Comments.

## 2023-11-15 NOTE — Patient Instructions (Signed)
 Ms. Kelsey Santana , Thank you for taking time to come for your Medicare Wellness Visit. I appreciate your ongoing commitment to your health goals. Please review the following plan we discussed and let me know if I can assist you in the future.   Referrals/Orders/Follow-Ups/Clinician Recommendations: Yes; Keep maintaining your health by keeping your appointments with Dr. Fortunato Curling and any specialists that you may see.  Call us if you need anything.  Have a great year!!!!  This is a list of the screening recommended for you and due dates:  Health Maintenance  Topic Date Due   DTaP/Tdap/Td vaccine (1 - Tdap) Never done   Zoster (Shingles) Vaccine (1 of 2) Never done   Pap with HPV screening  Never done   Mammogram  06/08/2019   COVID-19 Vaccine (3 - Moderna risk series) 02/03/2020   Medicare Annual Wellness Visit  11/14/2024   Colon Cancer Screening  05/07/2033   Flu Shot  Completed   Hepatitis C Screening  Completed   HIV Screening  Completed   HPV Vaccine  Aged Out    Advanced directives: (Declined) Advance directive discussed with you today. Even though you declined this today, please call our office should you change your mind, and we can give you the proper paperwork for you to fill out.  Next Medicare Annual Wellness Visit scheduled for next year: Yes

## 2023-11-19 ENCOUNTER — Encounter: Payer: Self-pay | Admitting: Registered Nurse

## 2023-11-19 ENCOUNTER — Encounter: Attending: Physical Medicine and Rehabilitation | Admitting: Registered Nurse

## 2023-11-19 VITALS — BP 130/77 | HR 84 | Ht 64.0 in | Wt 273.0 lb

## 2023-11-19 DIAGNOSIS — M47816 Spondylosis without myelopathy or radiculopathy, lumbar region: Secondary | ICD-10-CM | POA: Diagnosis present

## 2023-11-19 DIAGNOSIS — M7061 Trochanteric bursitis, right hip: Secondary | ICD-10-CM | POA: Insufficient documentation

## 2023-11-19 DIAGNOSIS — M1711 Unilateral primary osteoarthritis, right knee: Secondary | ICD-10-CM | POA: Diagnosis present

## 2023-11-19 DIAGNOSIS — Z5181 Encounter for therapeutic drug level monitoring: Secondary | ICD-10-CM | POA: Insufficient documentation

## 2023-11-19 DIAGNOSIS — M1712 Unilateral primary osteoarthritis, left knee: Secondary | ICD-10-CM | POA: Insufficient documentation

## 2023-11-19 DIAGNOSIS — G894 Chronic pain syndrome: Secondary | ICD-10-CM | POA: Diagnosis not present

## 2023-11-19 MED ORDER — HYDROCODONE-ACETAMINOPHEN 10-325 MG PO TABS: 1.0000 | ORAL_TABLET | Freq: Four times a day (QID) | ORAL | 0 refills | Status: DC | PRN

## 2023-11-19 NOTE — Progress Notes (Signed)
 Subjective:    Patient ID: Kelsey Santana, female    DOB: 27-Jan-1961, 63 y.o.   MRN: [id number]  HPI: Kelsey Santana is a 64 y.o. female who returns for follow up appointment for chronic pain and medication refill. She states her pain is located in her lower back and bilateral knee pain. She rates her pain 8. Her current exercise regime is walking and performing stretching exercises.  Kelsey Santana Morphine equivalent is 40.00 MME.   Last UDS was Performed on 08/08/2024, it was consistent.      Pain Inventory Average Pain 8 Pain Right Now 8 My pain is aching  In the last 24 hours, has pain interfered with the following? General activity 5 Relation with others 0 Enjoyment of life 0 What TIME of day is your pain at its worst? night Sleep (in general) Poor  Pain is worse with: walking, bending, standing, and some activites Pain improves with: medication Relief from Meds: 8  Family History  Problem Relation Age of Onset   Alcohol abuse Mother    Diabetes Mother    Hyperlipidemia Mother    Hypertension Mother    COPD Mother    Diabetes Brother    Hyperlipidemia Brother    Hypertension Brother    Depression Brother    Stroke Brother    Thyroid disease Neg Hx    Social History   Socioeconomic History   Marital status: Single    Spouse name: Not on file   Number of children: Not on file   Years of education: Not on file   Highest education level: Not on file  Occupational History   Not on file  Tobacco Use   Smoking status: Former   Smokeless tobacco: Former   Tobacco comments:    quit 28 yrs ago  Psychologist, educational Use   Vaping status: Never Used  Substance and Sexual Activity   Alcohol use: No   Drug use: No   Sexual activity: Never  Other Topics Concern   Not on file  Social History Narrative   epworth sleepiness scale = 10 (11/15/15)    Lives alone in a 2 story home.  Has 3 children.  Currently not working.  Has lost 6 jobs in the past 2 months due to her leg  numbness and pain.     Education: 2 years of college.   Social Drivers of Corporate investment banker Strain: Low Risk  (11/15/2023)   Overall Financial Resource Strain (CARDIA)    Difficulty of Paying Living Expenses: Not hard at all  Food Insecurity: No Food Insecurity (11/15/2023)   Hunger Vital Sign    Worried About Running Out of Food in the Last Year: Never true    Ran Out of Food in the Last Year: Never true  Transportation Needs: No Transportation Needs (11/15/2023)   PRAPARE - Administrator, Civil Service (Medical): No    Lack of Transportation (Non-Medical): No  Physical Activity: Insufficiently Active (11/15/2023)   Exercise Vital Sign    Days of Exercise per Week: 7 days    Minutes of Exercise per Session: 20 min  Stress: No Stress Concern Present (11/15/2023)   [employer] of Occupational Health - Occupational Stress Questionnaire    Feeling of Stress : Not at all  Social Connections: Unknown (11/15/2023)   Social Connection and Isolation Panel [NHANES]    Frequency of Communication with Friends and Family: More than three times a week    Frequency  of Social Gatherings with Friends and Family: More than three times a week    Attends Religious Services: More than 4 times per year    Active Member of Clubs or Organizations: Yes    Attends Engineer, structural: More than 4 times per year    Marital Status: Not on file   Past Surgical History:  Procedure Laterality Date   ABDOMINAL HYSTERECTOMY     complete   CHOLECYSTECTOMY N/A 10/03/2023   Procedure: LAPAROSCOPIC CHOLECYSTECTOMY;  Surgeon: Manus Rudd, MD;  Location: [facility] OR;  Service: General;  Laterality: N/A;  Assit: RNFA   COLONOSCOPY N/A 01/16/2017   Procedure: COLONOSCOPY;  Surgeon: Jeani Hawking, MD;  Location: [facility];  Service: Endoscopy;  Laterality: N/A;   ESOPHAGOGASTRODUODENOSCOPY N/A 01/16/2017   Procedure: ESOPHAGOGASTRODUODENOSCOPY (EGD);  Surgeon: Jeani Hawking, MD;   Location: [facility];  Service: Endoscopy;  Laterality: N/A;   INTRAOPERATIVE CHOLANGIOGRAM N/A 10/03/2023   Procedure: INTRAOPERATIVE CHOLANGIOGRAM;  Surgeon: Manus Rudd, MD;  Location: [facility] OR;  Service: General;  Laterality: N/A;   Past Surgical History:  Procedure Laterality Date   ABDOMINAL HYSTERECTOMY     complete   CHOLECYSTECTOMY N/A 10/03/2023   Procedure: LAPAROSCOPIC CHOLECYSTECTOMY;  Surgeon: Manus Rudd, MD;  Location: [facility] OR;  Service: General;  Laterality: N/A;  Assit: RNFA   COLONOSCOPY N/A 01/16/2017   Procedure: COLONOSCOPY;  Surgeon: Jeani Hawking, MD;  Location: [facility];  Service: Endoscopy;  Laterality: N/A;   ESOPHAGOGASTRODUODENOSCOPY N/A 01/16/2017   Procedure: ESOPHAGOGASTRODUODENOSCOPY (EGD);  Surgeon: Jeani Hawking, MD;  Location: [facility];  Service: Endoscopy;  Laterality: N/A;   INTRAOPERATIVE CHOLANGIOGRAM N/A 10/03/2023   Procedure: INTRAOPERATIVE CHOLANGIOGRAM;  Surgeon: Manus Rudd, MD;  Location: [facility] OR;  Service: General;  Laterality: N/A;   Past Medical History:  Diagnosis Date   Anxiety    Arthritis    Back pain    l 4 and l5 djd   Depression    Fatty liver    GERD (gastroesophageal reflux disease)    Hypercholesteremia    Hypertension    Migraine    Pre-diabetes    BP 130/77   Pulse 84   Ht 5\' 4"  (1.626 m)   Wt 273 lb (123.8 kg)   SpO2 97%   BMI 46.86 kg/m   Opioid Risk Score:   Fall Risk Score:  `1  Depression screen [facility] 2/9     11/15/2023   11:20 AM 08/27/2023    1:04 PM 08/09/2023    8:41 AM 06/14/2023    9:51 AM 06/05/2023    9:24 AM 05/11/2023    9:14 AM 04/12/2023   11:13 AM  Depression screen PHQ 2/9  Decreased Interest 0 0 0 1 0 0 1  Down, Depressed, Hopeless 0 0 0 1 0 0 1  PHQ - 2 Score 0 0 0 2 0 0 2  Altered sleeping 3        Tired, decreased energy 3        Change in appetite 1        Feeling bad or failure about yourself  0        Trouble concentrating 0        Moving slowly or fidgety/restless 0         Suicidal thoughts 0        PHQ-9 Score 7        Difficult doing work/chores Not difficult at all          Review of Systems  Musculoskeletal:  Positive for back pain.       Bilateral knee, hip pain  All other systems reviewed and are negative.     Objective:   Physical Exam Vitals and nursing note reviewed.  Constitutional:      Appearance: Normal appearance. She is obese.  Cardiovascular:     Rate and Rhythm: Normal rate and regular rhythm.     Pulses: Normal pulses.     Heart sounds: Normal heart sounds.  Pulmonary:     Effort: Pulmonary effort is normal.     Breath sounds: Normal breath sounds.  Musculoskeletal:     Comments: Normal Muscle Bulk and Muscle Testing Reveals:  Upper Extremities: Full ROM and Muscle Strength 5/5  Lumbar Paraspinal Tenderness: L-3-L-5 Right Greater Trochanter Tenderness Lower Extremities: Decreased ROM and Muscle Strength 5/5 Bilateral Lower Extremities Flexion Produces Pain into her Lumbar and Bilateral Patella's Arises from chair  slowly Antalgic Gait     Skin:    General: Skin is warm and dry.  Neurological:     Mental Status: She is alert and oriented to person, place, and time.  Psychiatric:        Mood and Affect: Mood normal.        Behavior: Behavior normal.         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 Continue to monitor. 11/19/2023 Bilateral Greater Trochanter Tenderness: No complaints today.  Continue to Alternate Ice and Heat Therapy. Continue to Monitor. 11/19/2023 Bilateral Primary Osteoarthritis of Bilateral Knees: S/P Synvisc Injection with Dr Carlis Abbott on 03/20/2023, with good relief noted . Continue  HEP as Tolerated . Continue current medication regimen as prescribed. Continue to Monitor. 11/19/2023 Chronic Pain Syndrome: Refilled  Hydrocodone 10mg /325 one tablet 4 times a day as needed for pain. #100.  We will continue  the opioid monitoring program, this consists of regular clinic visits, examinations, urine drug screen, pill counts as well as use of [place] Controlled Substance Reporting system. A 12 month History has been reviewed on the [place] Controlled Substance Reporting System on 11/19/2023 5.Morbid Obesity: Continue Healthy Diet Regimen and Continue with HEP as Tolerated. Marland KitchenPCP Following.  11/19/2023 Migraine without Aura: No Complaints today. Continue current medication regimen. Continue to Monitor. 11/19/2023.     F/U in 1 month

## 2023-11-27 ENCOUNTER — Encounter: Payer: 59 | Attending: Physical Medicine and Rehabilitation | Admitting: Physical Medicine and Rehabilitation

## 2023-11-27 ENCOUNTER — Encounter: Payer: Self-pay | Admitting: Physical Medicine and Rehabilitation

## 2023-11-27 VITALS — BP 171/97 | HR 61 | Ht 64.0 in | Wt 274.4 lb

## 2023-11-27 DIAGNOSIS — M25562 Pain in left knee: Secondary | ICD-10-CM

## 2023-11-27 DIAGNOSIS — M47816 Spondylosis without myelopathy or radiculopathy, lumbar region: Secondary | ICD-10-CM

## 2023-11-27 DIAGNOSIS — M25561 Pain in right knee: Secondary | ICD-10-CM | POA: Diagnosis present

## 2023-11-27 DIAGNOSIS — G8929 Other chronic pain: Secondary | ICD-10-CM

## 2023-11-27 DIAGNOSIS — M170 Bilateral primary osteoarthritis of knee: Secondary | ICD-10-CM

## 2023-11-27 MED ORDER — CAPSAICIN-CLEANSING GEL 8 % EX KIT
1.0000 | PACK | Freq: Once | CUTANEOUS | Status: AC
  Administered 2023-11-27: 1 via TOPICAL

## 2023-11-27 MED ORDER — CAPSAICIN-CLEANSING GEL 8 % EX KIT: 4.0000 | PACK | Freq: Once | CUTANEOUS | Status: DC

## 2023-11-27 NOTE — Progress Notes (Signed)
-  Discussed Qutenza as an option for neuropathic pain control. Discussed that this is a capsaicin patch, stronger than capsaicin cream. Discussed that it is currently approved for diabetic peripheral neuropathy and post-herpetic neuralgia, but that it has also shown benefit in treating other forms of neuropathy. Provided patient with link to site to learn more about the patch: https://www.clark.biz/. Discussed that the patch would be placed in office and benefits usually last 3 months. Discussed that unintended exposure to capsaicin can cause severe irritation of eyes, mucous membranes, respiratory tract, and skin, but that Qutenza is a local treatment and does not have the systemic side effects of other nerve medications. Discussed that there may be pain, itching, erythema, and decreased sensory function associated with the application of Qutenza. Side effects usually subside within 1 week. A cold pack of analgesic medications can help with these side effects. Blood pressure can also be increased due to pain associated with administration of the patch.   4 patches of Qutenza [phone]) was applied to the area of lower back and knees. Ice packs were applied during the procedure to ensure patient comfort. Blood pressure was monitored every 15 minutes. The patient tolerated the procedure well. Post-procedure instructions were given and follow-up has been scheduled.  Topical system measures 14cm x20cm (280cm for a total 1120units) were applied which will cause deeper penetration for destruction of the peripheral nerve using a chemical (Qutenza) which infuses into the skin like an injection and heat technique (occlusive, compressive dressing cauing endothermic heat technique)

## 2023-11-27 NOTE — Patient Instructions (Addendum)
 [place] MD  Green leafy vegetables  Methylated B vitamin  HTN: -Advised checking BP daily at home and logging results to bring into follow-up appointment with PCP and myself. -Reviewed BP meds today.  -Advised regarding healthy foods that can help lower blood pressure and provided with a list: 1) citrus foods- high in vitamins and minerals 2) salmon and other fatty fish - reduces inflammation and oxylipins 3) swiss chard (leafy green)- high level of nitrates 4) pumpkin seeds- one of the best natural sources of magnesium 5) Beans and lentils- high in fiber, magnesium, and potassium 6) Berries- high in flavonoids 7) Amaranth (whole grain, can be cooked similarly to rice and oats)- high in magnesium and fiber 8) Pistachios- even more effective at reducing BP than other nuts 9) Carrots- high in phenolic compounds that relax blood vessels and reduce inflammation 10) Celery- contain phthalides that relax tissues of arterial walls 11) Tomatoes- can also improve cholesterol and reduce risk of heart disease 12) Broccoli- good source of magnesium, calcium, and potassium 13) Greek yogurt: high in potassium and calcium 14) Herbs and spices: Celery seed, cilantro, saffron, lemongrass, black cumin, ginseng, cinnamon, cardamom, sweet basil, and ginger 15) Chia and flax seeds- also help to lower cholesterol and blood sugar 16) Beets- high levels of nitrates that relax blood vessels  17) spinach and bananas- high in potassium  -Provided lise of supplements that can help with hypertension:  1) magnesium: one high quality brand is Bioptemizers since it contains all 7 types of magnesium, otherwise over the counter magnesium gluconate 400mg  is a good option 2) B vitamins 3) vitamin D 4) potassium 5) CoQ10 6) L-arginine 7) Vitamin C 8) Beetroot -Educated that goal BP is 120/80. -Made goal to incorporate some of the above foods into diet.    Provided with list of supplements that can help with  dyslipidemia: 1) Vitamin B3 500-4,000mg  in divided doses daily (would recommend starting low as can cause uncomfortable facial flushing if started at too high a dose) 2) Phytosterols 2.15 grams daily 3) Fermented soy 30-50 grams daily 4) EGCG (found in green tea): 500-1000mg  daily 5) Omega-3 fatty acids 3000-5,000mg  daily 6) Flax seed 40 grams daily 7) Monounsaturated fats 20-40 grams daily (olives, olive oil, nuts), also reduces cardiovascular disease 8) Sesame: 40 grams daily 9) Gamma/delta tocotrienols- a family of unsaturated forms of Vitamin E- 200mg  with dinner 10) Pantethine 900mg  daily in divided doses 11) Resveratrol 250mg  daily 12) N Acetyl Cysteine 2000mg  daily in divided doses 13) Curcumin 2000-5000mg  in divided doses daily 14) Pomegranate juice: 8 ounces daily, also helps to lower blood pressure 15) Pomegranate seeds one cup daily, also helps to lower blood pressure 16) Citrus Bergamot 1000mg  daily, also helps with glucose control and weight loss 17) Vitamin C 500mg  daily 18) Quercetin 500-1000mg  daily 19) Glutathione 20) Probiotics 60-100 billion organisms per day 21) Fiber 22) Oats 23) Aged garlic (can eat as food or supplement of 600-900mg  per day) 24) Chia seeds 25 grams per day 25) Lycopene- carotenoid found in high concentrations in tomatoes. 26) Alpha linolenic acid 27) Flavonoids and anthocyanins 28) Wogonin- flavanoid that enhances reverse cholesterol transport 29) Coenzyme Q10 30) Pantethine- derivative of Vitamin B5: 300mg  three times per day or 450mg  twice per day with or without food 31) Barley and other whole grains 32) Orange juice 33) L- carnitine 34) L- Lysine 35) L- Arginine 36) Almonds 37) Morin 38) Rutin 39) Carnosine 40) Histidine  41) Kaempferol  42) Organosulfur compounds 43) Vitamin  E 44) Oleic acid 45) RBO (ferulic acid gammaoryzanol) 46) grape seed extract 47) Red wine 48) Berberine HCL 500mg  daily or twice per day- more effective  and with fewer adverse effects that ezetimibe monotherapy 49) red yeast rice 2400- 4800 mg/day 50) chlorella 51) Licorice

## 2023-11-27 NOTE — Progress Notes (Signed)
 Subjective:    Patient ID: Kelsey Santana, female    DOB: 1961/02/05, 63 y.o.   MRN: [id number]  HPI:   1) Bilateral knee pain -they had to help her out of church because of her knee pain -[place] -would like to get an XR -she would like bilateral steroid injections -norco helps but makes her sleepy -just received her hydrocodone refill  2) obesity Kelsey Santana is a 63 y.o. female who returns for f/u appointment for chronic pain secondary to bilateral knee osteoarthritis, obesity, and migraines. She states her pain is located in her lower back radiating into her right lower extremity, bilateral hip pain R>L and bilateral knee pain R>L. She rates her pain 7. Her current exercise regime is walking and performing stretching exercises. -she lost a lot of weight, she has gone from 213 to 247 lbs and weight loss has since stalled -she is continuing on the Ozempic 2mg  -she had stopped eating sugars,  -right knee is feeling better but left is hurting more. She does not want a knee replacement -right knee aches more at night -can't climb up stairs -she has to order groceries from [place] -she has lost 77 lbs with the [facility] -she is trying to find someone to prescribe the [facility] for her  Kelsey Santana Morphine equivalent is 33.33 MME.  She is upset about about her weight gain and is consider bypass surgery, has lost weight with semalglutide but weight loss has plateued -she is mostly eating fruits now, not eating much protein or vegetables -CBGs dropped while taking semalgutide and metformin so she stopped metformin -CBGs now stable -she is only eating one meal per day -has diffuse bone pain Knee pain has been severe She has been sleeping poorly at night She has [facility] membership at [employer] She is having severe migraines that have been responsive to Migranol in the past. She has failed antidepressants, Depakote, opioids, topamax, Wellbutrin. She has noticied that  opioids could trigger migraines when she is further from the dose. She has been taught elimination diet.  -has been very severe -she had trouble walking out of [employer] recently -she is not sure if the Zilretta was as effective as the steroid injections but would still like to try next visit  -she is considering gastric sleeve -she has to make herself eat -she has stopped sodas.  -she is trying to eat more protein -she bought Ensure Max -drinks water all day -lost 14 lbs since starting Ozempic! -only can take a couple of bites until she is full -she is trying to avoid bread, meat.  -she would like to try a medication -she does not want to try phenteramine when I shared that I have a patient who tried it and had a stroke -she would like to try Ozempic as she heard a friend lost a lot of weight on it and it curbed his appetite -she has been trying to eat healthier   3) Insomnia:  -sleeping poorly due to her pain and grief over son's death  4) Lower back pain: -low back pain present  5) Bilateral hip pain: -thinks this is stemming from her knee pain  6) Impaired balance: -interested in rehab   Pain Inventory Average Pain 10 Pain Right Now 8 My pain is constant, sharp, and aching stabbing  In the last 24 hours, has pain interfered with the following? General activity  Relation with others  Enjoyment of life 1 What  TIME of day is your pain at its worst? morning , daytime, evening, and night Sleep (in general) Poor  Pain is worse with: walking bending sitting standing Pain improves with: medication and injections rest Relief from Meds:   Family History  Problem Relation Age of Onset   Alcohol abuse Mother    Diabetes Mother    Hyperlipidemia Mother    Hypertension Mother    COPD Mother    Diabetes Brother    Hyperlipidemia Brother    Hypertension Brother    Depression Brother    Stroke Brother    Thyroid disease Neg Hx    Social History   Socioeconomic History    Marital status: Single    Spouse name: Not on file   Number of children: Not on file   Years of education: Not on file   Highest education level: Not on file  Occupational History   Not on file  Tobacco Use   Smoking status: Former   Smokeless tobacco: Former   Tobacco comments:    quit 28 yrs ago  Psychologist, educational Use   Vaping status: Never Used  Substance and Sexual Activity   Alcohol use: No   Drug use: No   Sexual activity: Never  Other Topics Concern   Not on file  Social History Narrative   epworth sleepiness scale = 10 (11/15/15)    Lives alone in a 2 story home.  Has 3 children.  Currently not working.  Has lost 6 jobs in the past 2 months due to her leg numbness and pain.     Education: 2 years of college.   Social Drivers of Corporate investment banker Strain: Low Risk  (11/15/2023)   Overall Financial Resource Strain (CARDIA)    Difficulty of Paying Living Expenses: Not hard at all  Food Insecurity: No Food Insecurity (11/15/2023)   Hunger Vital Sign    Worried About Running Out of Food in the Last Year: Never true    Ran Out of Food in the Last Year: Never true  Transportation Needs: No Transportation Needs (11/15/2023)   PRAPARE - Administrator, Civil Service (Medical): No    Lack of Transportation (Non-Medical): No  Physical Activity: Insufficiently Active (11/15/2023)   Exercise Vital Sign    Days of Exercise per Week: 7 days    Minutes of Exercise per Session: 20 min  Stress: No Stress Concern Present (11/15/2023)   [employer] of Occupational Health - Occupational Stress Questionnaire    Feeling of Stress : Not at all  Social Connections: Unknown (11/15/2023)   Social Connection and Isolation Panel [NHANES]    Frequency of Communication with Friends and Family: More than three times a week    Frequency of Social Gatherings with Friends and Family: More than three times a week    Attends Religious Services: More than 4 times per year    Active  Member of Clubs or Organizations: Yes    Attends Banker Meetings: More than 4 times per year    Marital Status: Not on file   Past Surgical History:  Procedure Laterality Date   ABDOMINAL HYSTERECTOMY     complete   CHOLECYSTECTOMY N/A 10/03/2023   Procedure: LAPAROSCOPIC CHOLECYSTECTOMY;  Surgeon: Manus Rudd, MD;  Location: [facility] OR;  Service: General;  Laterality: N/A;  Assit: RNFA   COLONOSCOPY N/A 01/16/2017   Procedure: COLONOSCOPY;  Surgeon: Jeani Hawking, MD;  Location: [facility] ENDOSCOPY;  Service: Endoscopy;  Laterality: N/A;  ESOPHAGOGASTRODUODENOSCOPY N/A 01/16/2017   Procedure: ESOPHAGOGASTRODUODENOSCOPY (EGD);  Surgeon: Jeani Hawking, MD;  Location: [facility];  Service: Endoscopy;  Laterality: N/A;   INTRAOPERATIVE CHOLANGIOGRAM N/A 10/03/2023   Procedure: INTRAOPERATIVE CHOLANGIOGRAM;  Surgeon: Manus Rudd, MD;  Location: [facility] OR;  Service: General;  Laterality: N/A;   Past Surgical History:  Procedure Laterality Date   ABDOMINAL HYSTERECTOMY     complete   CHOLECYSTECTOMY N/A 10/03/2023   Procedure: LAPAROSCOPIC CHOLECYSTECTOMY;  Surgeon: Manus Rudd, MD;  Location: [facility] OR;  Service: General;  Laterality: N/A;  Assit: RNFA   COLONOSCOPY N/A 01/16/2017   Procedure: COLONOSCOPY;  Surgeon: Jeani Hawking, MD;  Location: [facility] END[facility];  Service: Endoscopy;  Laterality: N/A;   ESOPHAGOGASTRODUODENOSCOPY N/A 01/16/2017   Procedure: ESOPHAGOGASTRODUODENOSCOPY (EGD);  Surgeon: Jeani Hawking, MD;  Location: [facility];  Service: Endoscopy;  Laterality: N/A;   INTRAOPERATIVE CHOLANGIOGRAM N/A 10/03/2023   Procedure: INTRAOPERATIVE CHOLANGIOGRAM;  Surgeon: Manus Rudd, MD;  Location: [facility] OR;  Service: General;  Laterality: N/A;   Past Medical History:  Diagnosis Date   Anxiety    Arthritis    Back pain    l 4 and l5 djd   Depression    Fatty liver    GERD (gastroesophageal reflux disease)    Hypercholesteremia    Hypertension    Migraine    Pre-diabetes    BP  (!) 171/97   Pulse 61   Ht 5\' 4"  (1.626 m)   Wt 274 lb 6.4 oz (124.5 kg)   SpO2 99%   BMI 47.10 kg/m   Opioid Risk Score:   Fall Risk Score:  `1  Depression screen [facility] 2/9     11/27/2023   11:25 AM 11/15/2023   11:20 AM 08/27/2023    1:04 PM 08/09/2023    8:41 AM 06/14/2023    9:51 AM 06/05/2023    9:24 AM 05/11/2023    9:14 AM  Depression screen PHQ 2/9  Decreased Interest 0 0 0 0 1 0 0  Down, Depressed, Hopeless 0 0 0 0 1 0 0  PHQ - 2 Score 0 0 0 0 2 0 0  Altered sleeping  3       Tired, decreased energy  3       Change in appetite  1       Feeling bad or failure about yourself   0       Trouble concentrating  0       Moving slowly or fidgety/restless  0       Suicidal thoughts  0       PHQ-9 Score  7       Difficult doing work/chores  Not difficult at all         Review of Systems  Musculoskeletal:  Positive for back pain, gait problem and joint swelling.       Bilateral knee pain  All other systems reviewed and are negative.      Objective:  Gen: no distress, normal appearing HEENT: oral mucosa pink and moist, NCAT Cardio: Reg rate Chest: normal effort, normal rate of breathing Abd: soft, non-distended Ext: no edema Psych: pleasant, normal affect Skin: intact MSK: bilateral knee TTP, stable 4/8    Assessment & Plan:  Right Lumbar Radiculitis: Continue Gabapentin . Continue HEP as Tolerated. Continue to monitor. XR ordered -Discussed Qutenza as an option for neuropathic pain control. Discussed that this is a capsaicin patch, stronger than capsaicin cream. Discussed that it is currently approved for diabetic peripheral  neuropathy and post-herpetic neuralgia, but that it has also shown benefit in treating other forms of neuropathy. Provided patient with link to site to learn more about the patch: https://www.clark.biz/. Discussed that the patch would be placed in office and benefits usually last 3 months. Discussed that unintended exposure to capsaicin can cause  severe irritation of eyes, mucous membranes, respiratory tract, and skin, but that Qutenza is a local treatment and does not have the systemic side effects of other nerve medications. Discussed that there may be pain, itching, erythema, and decreased sensory function associated with the application of Qutenza. Side effects usually subside within 1 week. A cold pack of analgesic medications can help with these side effects. Blood pressure can also be increased due to pain associated with administration of the patch.   Bilateral Greater Trochanter Tenderness: Continue to Alternate Ice and Heat Therapy. Continue to Monitor. X-rays ordered to assess for OA Prescribing Home Zynex NexWave Stimulator Device and supplies as needed. IFC, NMES and TENS medically necessary Treatment Rx: Daily @ 30-40 minutes per treatment PRN. Zynex NexWave only, no substitutions. Treatment Goals: 1) To reduce and/or eliminate pain 2) To improve functional capacity and Activities of daily living 3) To reduce or prevent the need for oral medications 4) To improve circulation in the injured region 5) To decrease or prevent muscle spasm and muscle atrophy 6) To provide a self-management tool to the patient The patient has not sufficiently improved with conservative care. Numerous studies indexed by Medline and PubMed.gov have shown Neuromuscular, Interferential, and TENS stimulators to reduce pain, improve function, and reduce medication use in injured patients. Continued use of this evidence based, safe, drug free treatment is both reasonable and medically necessary at this time.   Bilateral Primary Osteoarthritis of Bilateral Knees: Continue  HEP as Tolerated . Continue to Monitor. Discussed that continued weight loss with also help her knee pain.  inserted into the knee joint.  -discussed that Synvisc and GLP-1 has helped her knee OA improved  Chronic Pain Syndrome 2/2 knee OA: continue hydrocodone to 10mg  QID prn We will continue  the opioid monitoring program, this consists of regular clinic visits, examinations, urine drug screen, pill counts as well as use of [place] Controlled Substance Reporting system.  UDS reviewed and with expected metabilities  5. Obesity:  -continue resistance training, continue weight training -recommended avoiding MSG -continue dancing -weight is 274 lbs, discussed that she is doing a great job with her diet! -recommended Delrae Rend MD to get her tirzepatide  -encouraged resistance training -advised to make sure her morning protein shake has no added sugar or artifical sweeteners other than allulose or stevia Discussed following criteria for [facility]: 1) Patient has diagnosis of obesity; 2) Patient must be 4 years of age or older; 3) The patient has been involved in a physician or dietitian monitored weight loss program, consisting of both low-calorie diet, increased physical activity and behavioral counseling for a minimum of 6 months without a 3% loss from baseline; 4) Patients BMI is one of the following: a) 30kg/m or greater; b) 27 29.99 kg/m in the presence of at least one weight related comorbidity such as coronary heart disease, dyslipidemia, hypertension, symptomatic arthritis of the lower extremities, type 2 diabetes mellitus, obstructive sleep apnea; c) 25-29.9 kg/m2 and waist circumference is &gt; 40 inches for males or &gt; 35 inches for females;5) Not Met - Must have tried and failed at least one preferred oral agent such as Phentermine; 6) Patient has no labeled  contraindication; 7) Patient is not taking another weight loss medication; 8) The dose is within approved product labeling guidelines; 10) The patient must not be taking another GLP-1 receptor agonist AND the patient must not be taking insulin concurrently.  -encouraged stopping eating added sugars -continue nuts -discussed eating protein daily -continue avoiding bread -discussed that she likes chips -discussed foods that  naturally increase GLP-1 -continue shrimp as source of protein. Mercury in salmon may be contributing to her joint symptoms. Advised to stop sodas and made plan to do so. She did not tolerate topamax well. Discussed gastric bypass surgery and gastric sleeve -prescribed ozempic, discussed its mechanism of action, side effects, increase dose 2mg  -Discussed the benefits of intermittent fasting. Recommended starting with pushing dinner 15 minutes earlier and when this feels easy, continuing to push dinner 15 minutes earlier. Discussed that this can help her body to improve its ability to burn fat rather than glucose, improving insulin sensitivity. Recommended drinking Roobois tea in the evening to help curb appetite and for its numerous health benefits.   -discussed we can consider increasing metformin if she does not tolerate ozempic  6. Insomnia: -Try to go outside near sunrise -Get exercise during the day.  -Turn off all devices an hour before bedtime.  -Teas that can benefit: chamomile, valerian root, Brahmi (Bacopa) -Can consider over the counter melatonin, magnesium, and/or L-theanine. Melatonin is an anti-oxidant with multiple health benefits. Magnesium is involved in greater than 300 enzymatic reactions in the body and most of [place] are deficient as our soil is often depleted. There are 7 different types of magnesium- Bioptemizer's is a supplement with all 7 types, and each has unique benefits. Magnesium can also help with constipation and anxiety.  -Pistachios naturally increase the production of melatonin -Cozy Earth bamboo bed sheets are free from toxic chemicals.  -Tart cherry juice or a tart cherry supplement can improve sleep and soreness post-workout   7. Migraines:  -continue migranol for her headaches- this has worked for her in the past and she has failed multiple other medications (listed above)  8) Vitamin D deficiency: Continue ergocalciferol 50,000U once per week for 14 weeks.    9) HTN: Continue amlodipine and losartan -asked nursing to recheck BP -discussed with patient that may be elevated because she did not take her BP medication this morning -Advised checking BP daily at home and logging results to bring into follow-up appointment with PCP and myself. -Reviewed BP meds today.  -Advised regarding healthy foods that can help lower blood pressure and provided with a list: 1) citrus foods- high in vitamins and minerals 2) salmon and other fatty fish - reduces inflammation and oxylipins 3) swiss chard (leafy green)- high level of nitrates 4) pumpkin seeds- one of the best natural sources of magnesium 5) Beans and lentils- high in fiber, magnesium, and potassium 6) Berries- high in flavonoids 7) Amaranth (whole grain, can be cooked similarly to rice and oats)- high in magnesium and fiber 8) Pistachios- even more effective at reducing BP than other nuts 9) Carrots- high in phenolic compounds that relax blood vessels and reduce inflammation 10) Celery- contain phthalides that relax tissues of arterial walls 11) Tomatoes- can also improve cholesterol and reduce risk of heart disease 12) Broccoli- good source of magnesium, calcium, and potassium 13) Greek yogurt: high in potassium and calcium 14) Herbs and spices: Celery seed, cilantro, saffron, lemongrass, black cumin, ginseng, cinnamon, cardamom, sweet basil, and ginger 15) Chia and flax seeds- also help to lower  cholesterol and blood sugar 16) Beets- high levels of nitrates that relax blood vessels  17) spinach and bananas- high in potassium  -Provided lise of supplements that can help with hypertension:  1) magnesium: one high quality brand is Bioptemizers since it contains all 7 types of magnesium, otherwise over the counter magnesium gluconate 400mg  is a good option 2) B vitamins 3) vitamin D 4) potassium 5) CoQ10 6) L-arginine 7) Vitamin C 8) Beetroot -Educated that goal BP is 120/80. -Made goal to  incorporate some of the above foods into diet.     10) Impaired balance -referred to PT  11) HLD:  Provided with list of supplements that can help with dyslipidemia: 1) Vitamin B3 500-4,000mg  in divided doses daily (would recommend starting low as can cause uncomfortable facial flushing if started at too high a dose) 2) Phytosterols 2.15 grams daily 3) Fermented soy 30-50 grams daily 4) EGCG (found in green tea): 500-1000mg  daily 5) Omega-3 fatty acids 3000-5,000mg  daily 6) Flax seed 40 grams daily 7) Monounsaturated fats 20-40 grams daily (olives, olive oil, nuts), also reduces cardiovascular disease 8) Sesame: 40 grams daily 9) Gamma/delta tocotrienols- a family of unsaturated forms of Vitamin E- 200mg  with dinner 10) Pantethine 900mg  daily in divided doses 11) Resveratrol 250mg  daily 12) N Acetyl Cysteine 2000mg  daily in divided doses 13) Curcumin 2000-5000mg  in divided doses daily 14) Pomegranate juice: 8 ounces daily, also helps to lower blood pressure 15) Pomegranate seeds one cup daily, also helps to lower blood pressure 16) Citrus Bergamot 1000mg  daily, also helps with glucose control and weight loss 17) Vitamin C 500mg  daily 18) Quercetin 500-1000mg  daily 19) Glutathione 20) Probiotics 60-100 billion organisms per day 21) Fiber 22) Oats 23) Aged garlic (can eat as food or supplement of 600-900mg  per day) 24) Chia seeds 25 grams per day 25) Lycopene- carotenoid found in high concentrations in tomatoes. 26) Alpha linolenic acid 27) Flavonoids and anthocyanins 28) Wogonin- flavanoid that enhances reverse cholesterol transport 29) Coenzyme Q10 30) Pantethine- derivative of Vitamin B5: 300mg  three times per day or 450mg  twice per day with or without food 31) Barley and other whole grains 32) Orange juice 33) L- carnitine 34) L- Lysine 35) L- Arginine 36) Almonds 37) Morin 38) Rutin 39) Carnosine 40) Histidine  41) Kaempferol  42) Organosulfur compounds 43)  Vitamin E 44) Oleic acid 45) RBO (ferulic acid gammaoryzanol) 46) grape seed extract 47) Red wine 48) Berberine HCL 500mg  daily or twice per day- more effective and with fewer adverse effects that ezetimibe monotherapy 49) red yeast rice 2400- 4800 mg/day 50) chlorella 51) Licorice

## 2023-11-27 NOTE — Addendum Note (Signed)
 Addended by: Doreene Eland on: 11/27/2023 01:23 PM   Modules accepted: Orders

## 2023-12-02 NOTE — Progress Notes (Deleted)
    SUBJECTIVE:   CHIEF COMPLAINT / HPI: DME (shower chair and elevated toilet seat) & Refills (Metformin and Vit D)  ***  PERTINENT  PMH / PSH: anxiety, depression, arthritis, obesity, pre-diabetes, HTN, HLD  OBJECTIVE:   There were no vitals taken for this visit.  General: Awake and Alert in NAD HEENT: NCAT. Sclera anicteric. No rhinorrhea. Cardiovascular: RRR. No M/R/G Respiratory: CTAB, normal WOB on RA. No wheezing, crackles, rhonchi, or diminished breath sounds. Abdomen: Soft, non-tender, non-distended. Bowel sounds normoactive/hypoactive/hyperactive. *** Extremities: Able to move all extremities. No BLE edema, no deformities or significant joint findings. Skin: Warm and dry. No abrasions or rashes noted. Neuro: A&Ox***. No focal neurological deficits.  ASSESSMENT/PLAN:   Assessment & Plan      Clyda Dark, DO [facility]

## 2023-12-04 ENCOUNTER — Ambulatory Visit: Admitting: Family Medicine

## 2023-12-06 ENCOUNTER — Encounter: Payer: 59 | Admitting: Physical Medicine and Rehabilitation

## 2023-12-19 ENCOUNTER — Other Ambulatory Visit: Payer: Self-pay

## 2024-01-03 ENCOUNTER — Encounter: Attending: Physical Medicine and Rehabilitation | Admitting: Physical Medicine and Rehabilitation

## 2024-01-03 VITALS — BP 134/67 | HR 57 | Ht 64.0 in | Wt 277.0 lb

## 2024-01-03 DIAGNOSIS — M170 Bilateral primary osteoarthritis of knee: Secondary | ICD-10-CM | POA: Insufficient documentation

## 2024-01-03 DIAGNOSIS — G894 Chronic pain syndrome: Secondary | ICD-10-CM | POA: Insufficient documentation

## 2024-01-03 DIAGNOSIS — Z5181 Encounter for therapeutic drug level monitoring: Secondary | ICD-10-CM | POA: Insufficient documentation

## 2024-01-03 DIAGNOSIS — M47816 Spondylosis without myelopathy or radiculopathy, lumbar region: Secondary | ICD-10-CM | POA: Diagnosis present

## 2024-01-03 MED ORDER — LIDOCAINE HCL 1 % IJ SOLN
5.0000 mL | Freq: Once | INTRAMUSCULAR | Status: AC
  Administered 2024-01-03: 5 mL

## 2024-01-03 MED ORDER — BETAMETHASONE SOD PHOS & ACET 6 (3-3) MG/ML IJ SUSP
12.0000 mg | Freq: Once | INTRAMUSCULAR | Status: AC
  Administered 2024-01-03: 12 mg via INTRA_ARTICULAR

## 2024-01-03 MED ORDER — HYDROCODONE-ACETAMINOPHEN 10-325 MG PO TABS: 1.0000 | ORAL_TABLET | Freq: Four times a day (QID) | ORAL | 0 refills | Status: DC | PRN

## 2024-01-03 NOTE — Addendum Note (Signed)
 Addended by: Liam Redhead on: 01/03/2024 11:17 AM   Modules accepted: Orders

## 2024-01-03 NOTE — Addendum Note (Signed)
 Addended by: Liam Redhead on: 01/03/2024 11:25 AM   Modules accepted: Orders

## 2024-01-03 NOTE — Progress Notes (Signed)
 Knee injection, bilateral  Indication:Bilateral knee pain not relieved by medication management and other conservative care.  Informed consent was obtained after describing risks and benefits of the procedure with the patient, this includes bleeding, bruising, infection and medication side effects. The patient wishes to proceed and has given written consent. The patient was placed in a recumbent position. The medial aspect of the knee was marked and prepped with Betadine and alcohol. It was then entered with a 25-gauge 1-1/2 inch needle and 1 mL of 1% lidocaine  was injected into the skin and subcutaneous tissue. Then another needle was inserted into the knee joint. After negative draw back for blood, a solution containing one ML of 6mg  per mL betamethasone  and 3 mL of 1% lidocaine  were injected. The patient tolerated the procedure well. Post procedure instructions were given. Repeated on other side

## 2024-01-17 ENCOUNTER — Encounter: Payer: Self-pay | Admitting: Registered Nurse

## 2024-01-17 ENCOUNTER — Encounter: Admitting: Registered Nurse

## 2024-01-17 VITALS — BP 116/76 | HR 60 | Ht 64.0 in | Wt 278.2 lb

## 2024-01-17 DIAGNOSIS — M47816 Spondylosis without myelopathy or radiculopathy, lumbar region: Secondary | ICD-10-CM

## 2024-01-17 DIAGNOSIS — G894 Chronic pain syndrome: Secondary | ICD-10-CM | POA: Diagnosis not present

## 2024-01-17 DIAGNOSIS — M170 Bilateral primary osteoarthritis of knee: Secondary | ICD-10-CM | POA: Diagnosis not present

## 2024-01-17 DIAGNOSIS — Z5181 Encounter for therapeutic drug level monitoring: Secondary | ICD-10-CM

## 2024-01-17 MED ORDER — HYDROCODONE-ACETAMINOPHEN 10-325 MG PO TABS: 1.0000 | ORAL_TABLET | Freq: Four times a day (QID) | ORAL | 0 refills | Status: DC | PRN

## 2024-01-17 NOTE — Progress Notes (Deleted)
 Subjective:    Patient ID: Kelsey Santana, female    DOB: [birth year], 63 y.o.   MRN: [id number]  HPI  Pain Inventory Average Pain {NUMBERS; 0-10:5044} Pain Right Now {NUMBERS; 0-10:5044} My pain is {PAIN DESCRIPTION:21022940}  In the last 24 hours, has pain interfered with the following? General activity {NUMBERS; 0-10:5044} Relation with others {NUMBERS; 0-10:5044} Enjoyment of life {NUMBERS; 0-10:5044} What TIME of day is your pain at its worst? {time of day:24191} Sleep (in general) {BHH GOOD/FAIR/POOR:22877}  Pain is worse with: {ACTIVITIES:21022942} Pain improves with: {PAIN IMPROVES WUJW:11914782} Relief from Meds: {NUMBERS; 0-10:5044}  Family History  Problem Relation Age of Onset   Alcohol abuse Mother    Diabetes Mother    Hyperlipidemia Mother    Hypertension Mother    COPD Mother    Diabetes Brother    Hyperlipidemia Brother    Hypertension Brother    Depression Brother    Stroke Brother    Thyroid  disease Neg Hx    Social History   Socioeconomic History   Marital status: Single    Spouse name: Not on file   Number of children: Not on file   Years of education: Not on file   Highest education level: Not on file  Occupational History   Not on file  Tobacco Use   Smoking status: Former   Smokeless tobacco: Former   Tobacco comments:    quit 28 yrs ago  Psychologist, educational Use   Vaping status: Never Used  Substance and Sexual Activity   Alcohol use: No   Drug use: No   Sexual activity: Never  Other Topics Concern   Not on file  Social History Narrative   epworth sleepiness scale = 10 (11/15/15)    Lives alone in a 2 story home.  Has 3 children.  Currently not working.  Has lost 6 jobs in the past 2 months due to her leg numbness and pain.     Education: 2 years of college.   Social Drivers of Corporate investment banker Strain: Low Risk  (11/15/2023)   Overall Financial Resource Strain (CARDIA)    Difficulty of Paying Living Expenses: Not hard at all   Food Insecurity: No Food Insecurity (11/15/2023)   Hunger Vital Sign    Worried About Running Out of Food in the Last Year: Never true    Ran Out of Food in the Last Year: Never true  Transportation Needs: No Transportation Needs (11/15/2023)   PRAPARE - Administrator, Civil Service (Medical): No    Lack of Transportation (Non-Medical): No  Physical Activity: Insufficiently Active (11/15/2023)   Exercise Vital Sign    Days of Exercise per Week: 7 days    Minutes of Exercise per Session: 20 min  Stress: No Stress Concern Present (11/15/2023)   [employer] of Occupational Health - Occupational Stress Questionnaire    Feeling of Stress : Not at all  Social Connections: Unknown (11/15/2023)   Social Connection and Isolation Panel [NHANES]    Frequency of Communication with Friends and Family: More than three times a week    Frequency of Social Gatherings with Friends and Family: More than three times a week    Attends Religious Services: More than 4 times per year    Active Member of [employer] or Organizations: Yes    Attends Banker Meetings: More than 4 times per year    Marital Status: Not on file   Past Surgical History:  Procedure Laterality  Date   ABDOMINAL HYSTERECTOMY     complete   CHOLECYSTECTOMY N/A 10/03/2023   Procedure: LAPAROSCOPIC CHOLECYSTECTOMY;  Surgeon: Dareen Ebbing, MD;  Location: [facility] OR;  Service: General;  Laterality: N/A;  Assit: RNFA   COLONOSCOPY N/A 01/16/2017   Procedure: COLONOSCOPY;  Surgeon: Alvis Jourdain, MD;  Location: [facility];  Service: Endoscopy;  Laterality: N/A;   ESOPHAGOGASTRODUODENOSCOPY N/A 01/16/2017   Procedure: ESOPHAGOGASTRODUODENOSCOPY (EGD);  Surgeon: Alvis Jourdain, MD;  Location: [facility];  Service: Endoscopy;  Laterality: N/A;   INTRAOPERATIVE CHOLANGIOGRAM N/A 10/03/2023   Procedure: INTRAOPERATIVE CHOLANGIOGRAM;  Surgeon: Dareen Ebbing, MD;  Location: [facility] OR;  Service: General;  Laterality: N/A;   Past  Surgical History:  Procedure Laterality Date   ABDOMINAL HYSTERECTOMY     complete   CHOLECYSTECTOMY N/A 10/03/2023   Procedure: LAPAROSCOPIC CHOLECYSTECTOMY;  Surgeon: Dareen Ebbing, MD;  Location: [facility] OR;  Service: General;  Laterality: N/A;  Assit: RNFA   COLONOSCOPY N/A 01/16/2017   Procedure: COLONOSCOPY;  Surgeon: Alvis Jourdain, MD;  Location: [facility];  Service: Endoscopy;  Laterality: N/A;   ESOPHAGOGASTRODUODENOSCOPY N/A 01/16/2017   Procedure: ESOPHAGOGASTRODUODENOSCOPY (EGD);  Surgeon: Alvis Jourdain, MD;  Location: [facility];  Service: Endoscopy;  Laterality: N/A;   INTRAOPERATIVE CHOLANGIOGRAM N/A 10/03/2023   Procedure: INTRAOPERATIVE CHOLANGIOGRAM;  Surgeon: Dareen Ebbing, MD;  Location: [facility] OR;  Service: General;  Laterality: N/A;   Past Medical History:  Diagnosis Date   Anxiety    Arthritis    Back pain    l 4 and l5 djd   Depression    Fatty liver    GERD (gastroesophageal reflux disease)    Hypercholesteremia    Hypertension    Migraine    Pre-diabetes    There were no vitals taken for this visit.  Opioid Risk Score:   Fall Risk Score:  `1  Depression screen [facility] 2/9     11/27/2023   11:25 AM 11/15/2023   11:20 AM 08/27/2023    1:04 PM 08/09/2023    8:41 AM 06/14/2023    9:51 AM 06/05/2023    9:24 AM 05/11/2023    9:14 AM  Depression screen PHQ 2/9  Decreased Interest 0 0 0 0 1 0 0  Down, Depressed, Hopeless 0 0 0 0 1 0 0  PHQ - 2 Score 0 0 0 0 2 0 0  Altered sleeping  3       Tired, decreased energy  3       Change in appetite  1       Feeling bad or failure about yourself   0       Trouble concentrating  0       Moving slowly or fidgety/restless  0       Suicidal thoughts  0       PHQ-9 Score  7       Difficult doing work/chores  Not difficult at all          Review of Systems     Objective:   Physical Exam        Assessment & Plan:

## 2024-01-17 NOTE — Progress Notes (Signed)
 Subjective:    Patient ID: Kelsey Santana, female    DOB: [birth year], 63 y.o.   MRN: [id number]  HPI: Kelsey Santana is a 63 y.o. female who returns for follow up appointment for chronic pain and medication refill. She states her pain is located in her lower back, bilateral hips and bilateral knee pain. She rates her pain 0. Her current exercise regime is walking and performing stretching exercises.  Kelsey Santana Morphine  equivalent is 40.00 MME.      Pain Inventory Average Pain 10 Pain Right Now 0 My pain is constant, sharp, stabbing, tingling, and aching  In the last 24 hours, has pain interfered with the following? General activity 10 Relation with others 1 Enjoyment of life 0 What TIME of day is your pain at its worst? morning , daytime, evening, night, and varies Sleep (in general) Poor  Pain is worse with: walking, bending, sitting, inactivity, standing, and some activites Pain improves with: medication and injections Relief from Meds: 10  Family History  Problem Relation Age of Onset   Alcohol abuse Mother    Diabetes Mother    Hyperlipidemia Mother    Hypertension Mother    COPD Mother    Diabetes Brother    Hyperlipidemia Brother    Hypertension Brother    Depression Brother    Stroke Brother    Thyroid  disease Neg Hx    Social History   Socioeconomic History   Marital status: Single    Spouse name: Not on file   Number of children: Not on file   Years of education: Not on file   Highest education level: Not on file  Occupational History   Not on file  Tobacco Use   Smoking status: Former   Smokeless tobacco: Former   Tobacco comments:    quit 28 yrs ago  Psychologist, educational Use   Vaping status: Never Used  Substance and Sexual Activity   Alcohol use: No   Drug use: No   Sexual activity: Never  Other Topics Concern   Not on file  Social History Narrative   epworth sleepiness scale = 10 (11/15/15)    Lives alone in a 2 story home.  Has 3 children.   Currently not working.  Has lost 6 jobs in the past 2 months due to her leg numbness and pain.     Education: 2 years of college.   Social Drivers of Corporate investment banker Strain: Low Risk  (11/15/2023)   Overall Financial Resource Strain (CARDIA)    Difficulty of Paying Living Expenses: Not hard at all  Food Insecurity: No Food Insecurity (11/15/2023)   Hunger Vital Sign    Worried About Running Out of Food in the Last Year: Never true    Ran Out of Food in the Last Year: Never true  Transportation Needs: No Transportation Needs (11/15/2023)   PRAPARE - Administrator, Civil Service (Medical): No    Lack of Transportation (Non-Medical): No  Physical Activity: Insufficiently Active (11/15/2023)   Exercise Vital Sign    Days of Exercise per Week: 7 days    Minutes of Exercise per Session: 20 min  Stress: No Stress Concern Present (11/15/2023)   [employer] of Occupational Health - Occupational Stress Questionnaire    Feeling of Stress : Not at all  Social Connections: Unknown (11/15/2023)   Social Connection and Isolation Panel [NHANES]    Frequency of Communication with Friends and Family: More than three times  a week    Frequency of Social Gatherings with Friends and Family: More than three times a week    Attends Religious Services: More than 4 times per year    Active Member of Clubs or Organizations: Yes    Attends Engineer, structural: More than 4 times per year    Marital Status: Not on file   Past Surgical History:  Procedure Laterality Date   ABDOMINAL HYSTERECTOMY     complete   CHOLECYSTECTOMY N/A 10/03/2023   Procedure: LAPAROSCOPIC CHOLECYSTECTOMY;  Surgeon: Dareen Ebbing, MD;  [facility];  Service: General;  Laterality: N/A;  Assit: RNFA   COLONOSCOPY N/A 01/16/2017   Procedure: COLONOSCOPY;  Surgeon: Alvis Jourdain, MD;  Location: [facility];  Service: Endoscopy;  Laterality: N/A;   ESOPHAGOGASTRODUODENOSCOPY N/A 01/16/2017    Procedure: ESOPHAGOGASTRODUODENOSCOPY (EGD);  Surgeon: Alvis Jourdain, MD;  Location: [facility];  Service: Endoscopy;  Laterality: N/A;   INTRAOPERATIVE CHOLANGIOGRAM N/A 10/03/2023   Procedure: INTRAOPERATIVE CHOLANGIOGRAM;  Surgeon: Dareen Ebbing, MD;  Location: [facility] OR;  Service: General;  Laterality: N/A;   Past Surgical History:  Procedure Laterality Date   ABDOMINAL HYSTERECTOMY     complete   CHOLECYSTECTOMY N/A 10/03/2023   Procedure: LAPAROSCOPIC CHOLECYSTECTOMY;  Surgeon: Dareen Ebbing, MD;  Location: [facility] OR;  Service: General;  Laterality: N/A;  Assit: RNFA   COLONOSCOPY N/A 01/16/2017   Procedure: COLONOSCOPY;  Surgeon: Alvis Jourdain, MD;  Location: [facility];  Service: Endoscopy;  Laterality: N/A;   ESOPHAGOGASTRODUODENOSCOPY N/A 01/16/2017   Procedure: ESOPHAGOGASTRODUODENOSCOPY (EGD);  Surgeon: Alvis Jourdain, MD;  Location: [facility];  Service: Endoscopy;  Laterality: N/A;   INTRAOPERATIVE CHOLANGIOGRAM N/A 10/03/2023   Procedure: INTRAOPERATIVE CHOLANGIOGRAM;  Surgeon: Dareen Ebbing, MD;  Location[facility] OR;  Service: General;  Laterality: N/A;   Past Medical History:  Diagnosis Date   Anxiety    Arthritis    Back pain    l 4 and l5 djd   Depression    Fatty liver    GERD (gastroesophageal reflux disease)    Hypercholesteremia    Hypertension    Migraine    Pre-diabetes    BP 116/76 (BP Location: Left Arm, Patient Position: Sitting)   Pulse 60   Ht 5\' 4"  (1.626 m)   Wt 278 lb 3.2 oz (126.2 kg)   SpO2 97%   BMI 47.75 kg/m   Opioid Risk Score:   Fall Risk Score:  `1  Depression screen [facility] 2/9     01/17/2024    1:43 PM 11/27/2023   11:25 AM 11/15/2023   11:20 AM 08/27/2023    1:04 PM 08/09/2023    8:41 AM 06/14/2023    9:51 AM 06/05/2023    9:24 AM  Depression screen PHQ 2/9  Decreased Interest 0 0 0 0 0 1 0  Down, Depressed, Hopeless 0 0 0 0 0 1 0  PHQ - 2 Score 0 0 0 0 0 2 0  Altered sleeping   3      Tired, decreased energy   3      Change in  appetite   1      Feeling bad or failure about yourself    0      Trouble concentrating   0      Moving slowly or fidgety/restless   0      Suicidal thoughts   0      PHQ-9 Score   7      Difficult doing work/chores  Not difficult at all          Review of Systems  All other systems reviewed and are negative.      Objective:   Physical Exam Vitals and nursing note reviewed.  Constitutional:      Appearance: Normal appearance.  Cardiovascular:     Rate and Rhythm: Normal rate and regular rhythm.     Pulses: Normal pulses.     Heart sounds: Normal heart sounds.  Pulmonary:     Effort: Pulmonary effort is normal.     Breath sounds: Normal breath sounds.  Musculoskeletal:     Comments: Normal Muscle Bulk and Muscle Testing Reveals:  Upper Extremities: Full ROM and Muscle Strength 5/5  Lumbar Paraspinal Tenderness: L-4-L-5 Lower Extremities: Full ROM and Muscle Strength 5/5 Arises from chair with ease Narrow Based Gait     Skin:    General: Skin is warm and dry.  Neurological:     Mental Status: She is alert and oriented to person, place, and time.  Psychiatric:        Mood and Affect: Mood normal.        Behavior: Behavior normal.         Assessment & Plan:  Chronic Low Back Pain without Sciatica/ Right Lumbar Radiculitis: Continue Gabapentin  .Continue HEP as Tolerated. Continue to monitor. 01/17/2024 Bilateral Greater Trochanter Tenderness: Scheduled for Bilateral hip injection. Continue to Alternate Ice and Heat Therapy. Continue to Monitor. 01/17/2024 Bilateral Primary Osteoarthritis of Bilateral Knees: S/P Synvisc Injection with Dr Alessandra Ancona on 01/03/2024, with good relief noted . Continue  HEP as Tolerated . Continue current medication regimen as prescribed. Continue to Monitor. 01/17/2024 Chronic Pain Syndrome: Refilled  Hydrocodone  10mg /325 one tablet 4 times a day as needed for pain. #100.  We will continue the opioid monitoring program, this consists of regular  clinic visits, examinations, urine drug screen, pill counts as well as use of [place]  Controlled Substance Reporting system. A 12 month History has been reviewed on the [place]  Controlled Substance Reporting System on 01/17/2024 5.Morbid Obesity: Continue Healthy Diet Regimen and Continue with HEP as Tolerated. Aaron AasPCP Following.  01/17/2024 Migraine without Aura: No Complaints today. Continue current medication regimen. Continue to Monitor. 01/17/2024.     F/U in 2 months

## 2024-01-18 MED FILL — Capsaicin Patch 8% & Cleansing Gel Kit: CUTANEOUS | Qty: 4 | Status: AC

## 2024-02-18 ENCOUNTER — Ambulatory Visit: Admitting: Registered Nurse

## 2024-03-10 ENCOUNTER — Encounter: Payer: Self-pay | Admitting: Physical Medicine and Rehabilitation

## 2024-03-10 ENCOUNTER — Encounter: Attending: Physical Medicine and Rehabilitation | Admitting: Physical Medicine and Rehabilitation

## 2024-03-10 ENCOUNTER — Other Ambulatory Visit: Payer: Self-pay | Admitting: Physical Medicine and Rehabilitation

## 2024-03-10 VITALS — BP 136/85 | HR 94 | Ht 64.0 in | Wt 272.0 lb

## 2024-03-10 DIAGNOSIS — Z79891 Long term (current) use of opiate analgesic: Secondary | ICD-10-CM | POA: Diagnosis present

## 2024-03-10 DIAGNOSIS — G8929 Other chronic pain: Secondary | ICD-10-CM | POA: Diagnosis present

## 2024-03-10 DIAGNOSIS — M25562 Pain in left knee: Secondary | ICD-10-CM | POA: Insufficient documentation

## 2024-03-10 DIAGNOSIS — M25561 Pain in right knee: Secondary | ICD-10-CM | POA: Diagnosis present

## 2024-03-10 DIAGNOSIS — Z5181 Encounter for therapeutic drug level monitoring: Secondary | ICD-10-CM | POA: Insufficient documentation

## 2024-03-10 MED ORDER — LIDOCAINE HCL 1 % IJ SOLN
5.0000 mL | Freq: Once | INTRAMUSCULAR | Status: AC
Start: 2024-03-10 — End: 2024-03-10
  Administered 2024-03-10: 5 mL via INTRADERMAL

## 2024-03-10 MED ORDER — HYDROCODONE-ACETAMINOPHEN 10-325 MG PO TABS
1.0000 | ORAL_TABLET | Freq: Four times a day (QID) | ORAL | 0 refills | Status: DC | PRN
Start: 2024-03-10 — End: 2024-04-15

## 2024-03-10 MED ORDER — BETAMETHASONE SOD PHOS & ACET 6 (3-3) MG/ML IJ SUSP
12.0000 mg | Freq: Once | INTRAMUSCULAR | Status: AC
  Administered 2024-03-10: 12 mg via INTRA_ARTICULAR

## 2024-03-10 NOTE — Progress Notes (Signed)
 Knee injection, right  Indication: Right knee pain not relieved by medication management and other conservative care.  Informed consent was obtained after describing risks and benefits of the procedure with the patient, this includes bleeding, bruising, infection and medication side effects. The patient wishes to proceed and has given written consent. The patient was placed in a recumbent position. The medial aspect of the knee was marked and prepped with Betadine and alcohol. It was then entered with a 25-gauge 1-1/2 inch needle and 1 mL of 1% lidocaine  was injected into the skin and subcutaneous tissue. Then another needle was inserted into the knee joint. After negative draw back for blood, a solution containing one ML of 6mg  per mL betamethasone  and 3 mL of 1% lidocaine  were injected. The patient tolerated the procedure well. Post procedure instructions were given.

## 2024-03-13 LAB — TOXASSURE SELECT,+ANTIDEPR,UR

## 2024-03-25 ENCOUNTER — Ambulatory Visit: Admitting: Physical Medicine and Rehabilitation

## 2024-04-04 ENCOUNTER — Encounter: Attending: Physical Medicine and Rehabilitation | Admitting: Physical Medicine and Rehabilitation

## 2024-04-04 DIAGNOSIS — Z79891 Long term (current) use of opiate analgesic: Secondary | ICD-10-CM | POA: Insufficient documentation

## 2024-04-04 DIAGNOSIS — M170 Bilateral primary osteoarthritis of knee: Secondary | ICD-10-CM | POA: Insufficient documentation

## 2024-04-04 DIAGNOSIS — M545 Low back pain, unspecified: Secondary | ICD-10-CM | POA: Insufficient documentation

## 2024-04-04 DIAGNOSIS — M7062 Trochanteric bursitis, left hip: Secondary | ICD-10-CM | POA: Insufficient documentation

## 2024-04-04 DIAGNOSIS — Z5181 Encounter for therapeutic drug level monitoring: Secondary | ICD-10-CM | POA: Insufficient documentation

## 2024-04-04 DIAGNOSIS — G894 Chronic pain syndrome: Secondary | ICD-10-CM | POA: Insufficient documentation

## 2024-04-04 DIAGNOSIS — M7061 Trochanteric bursitis, right hip: Secondary | ICD-10-CM | POA: Insufficient documentation

## 2024-04-04 DIAGNOSIS — M47816 Spondylosis without myelopathy or radiculopathy, lumbar region: Secondary | ICD-10-CM | POA: Insufficient documentation

## 2024-04-04 DIAGNOSIS — G8929 Other chronic pain: Secondary | ICD-10-CM | POA: Insufficient documentation

## 2024-04-15 ENCOUNTER — Encounter (HOSPITAL_BASED_OUTPATIENT_CLINIC_OR_DEPARTMENT_OTHER): Admitting: Registered Nurse

## 2024-04-15 VITALS — BP 123/80 | HR 60 | Ht 64.0 in | Wt 270.0 lb

## 2024-04-15 DIAGNOSIS — Z79891 Long term (current) use of opiate analgesic: Secondary | ICD-10-CM | POA: Diagnosis present

## 2024-04-15 DIAGNOSIS — M47816 Spondylosis without myelopathy or radiculopathy, lumbar region: Secondary | ICD-10-CM

## 2024-04-15 DIAGNOSIS — M7062 Trochanteric bursitis, left hip: Secondary | ICD-10-CM

## 2024-04-15 DIAGNOSIS — M170 Bilateral primary osteoarthritis of knee: Secondary | ICD-10-CM

## 2024-04-15 DIAGNOSIS — Z5181 Encounter for therapeutic drug level monitoring: Secondary | ICD-10-CM | POA: Diagnosis present

## 2024-04-15 DIAGNOSIS — G8929 Other chronic pain: Secondary | ICD-10-CM | POA: Diagnosis present

## 2024-04-15 DIAGNOSIS — G894 Chronic pain syndrome: Secondary | ICD-10-CM | POA: Diagnosis present

## 2024-04-15 DIAGNOSIS — M7061 Trochanteric bursitis, right hip: Secondary | ICD-10-CM

## 2024-04-15 DIAGNOSIS — M545 Low back pain, unspecified: Secondary | ICD-10-CM

## 2024-04-15 MED ORDER — HYDROCODONE-ACETAMINOPHEN 10-325 MG PO TABS: 1.0000 | ORAL_TABLET | Freq: Four times a day (QID) | ORAL | 0 refills | Status: DC | PRN

## 2024-04-15 NOTE — Progress Notes (Signed)
 Subjective:    Patient ID: Kelsey Santana, female    DOB: Sep 12, 1960, 63 y.o.   MRN: [id number]  HPI: Kelsey Santana is a 63 y.o. female who returns for follow up appointment for chronic pain and medication refill. She states her pain is located in her lower back, bilateral hips R>L and bilateral knees R>L. She rates her pain 3. Her current exercise regime is walking and performing stretching exercises.  Kelsey Santana Morphine  equivalent is 40.00 MME.   Last UDS was Performed on 03/10/2024, it was consistent.     Pain Inventory Average Pain 10 Pain Right Now 3 My pain is sharp, burning, stabbing, tingling, and aching  In the last 24 hours, has pain interfered with the following? General activity 6 Relation with others 5 Enjoyment of life 6 What TIME of day is your pain at its worst? morning , daytime, evening, and night Sleep (in general) Fair  Pain is worse with: walking, bending, sitting, inactivity, standing, and some activites Pain improves with: heat/ice, pacing activities, medication, TENS, and injections Relief from Meds: 10  Family History  Problem Relation Age of Onset   Alcohol abuse Mother    Diabetes Mother    Hyperlipidemia Mother    Hypertension Mother    COPD Mother    Diabetes Brother    Hyperlipidemia Brother    Hypertension Brother    Depression Brother    Stroke Brother    Thyroid  disease Neg Hx    Social History   Socioeconomic History   Marital status: Single    Spouse name: Not on file   Number of children: Not on file   Years of education: Not on file   Highest education level: Not on file  Occupational History   Not on file  Tobacco Use   Smoking status: Former   Smokeless tobacco: Former   Tobacco comments:    quit 28 yrs ago  Psychologist, educational Use   Vaping status: Never Used  Substance and Sexual Activity   Alcohol use: No   Drug use: No   Sexual activity: Never  Other Topics Concern   Not on file  Social History Narrative   epworth  sleepiness scale = 10 (11/15/15)    Lives alone in a 2 story home.  Has 3 children.  Currently not working.  Has lost 6 jobs in the past 2 months due to her leg numbness and pain.     Education: 2 years of college.   Social Drivers of Corporate investment banker Strain: Low Risk  (11/15/2023)   Overall Financial Resource Strain (CARDIA)    Difficulty of Paying Living Expenses: Not hard at all  Food Insecurity: No Food Insecurity (11/15/2023)   Hunger Vital Sign    Worried About Running Out of Food in the Last Year: Never true    Ran Out of Food in the Last Year: Never true  Transportation Needs: No Transportation Needs (11/15/2023)   PRAPARE - Administrator, Civil Service (Medical): No    Lack of Transportation (Non-Medical): No  Physical Activity: Insufficiently Active (11/15/2023)   Exercise Vital Sign    Days of Exercise per Week: 7 days    Minutes of Exercise per Session: 20 min  Stress: No Stress Concern Present (11/15/2023)   [employer] of Occupational Health - Occupational Stress Questionnaire    Feeling of Stress : Not at all  Social Connections: Unknown (11/15/2023)   Social Connection and Isolation Panel  Frequency of Communication with Friends and Family: More than three times a week    Frequency of Social Gatherings with Friends and Family: More than three times a week    Attends Religious Services: More than 4 times per year    Active Member of Clubs or Organizations: Yes    Attends Banker Meetings: More than 4 times per year    Marital Status: Not on file   Past Surgical History:  Procedure Laterality Date   ABDOMINAL HYSTERECTOMY     complete   CHOLECYSTECTOMY N/A 10/03/2023   Procedure: LAPAROSCOPIC CHOLECYSTECTOMY;  Surgeon: Belinda Cough, MD;  Location: [facility] OR;  Service: General;  Laterality: N/A;  Assit: RNFA   COLONOSCOPY N/A 01/16/2017   Procedure: COLONOSCOPY;  Surgeon: Rollin Dover, MD;  Location: [facility];  Service:  Endoscopy;  Laterality: N/A;   ESOPHAGOGASTRODUODENOSCOPY N/A 01/16/2017   Procedure: ESOPHAGOGASTRODUODENOSCOPY (EGD);  Surgeon: Rollin Dover, MD;  Location: [facility];  Service: Endoscopy;  Laterality: N/A;   INTRAOPERATIVE CHOLANGIOGRAM N/A 10/03/2023   Procedure: INTRAOPERATIVE CHOLANGIOGRAM;  Surgeon: Belinda Cough, MD;  Location: [facility] OR;  Service: General;  Laterality: N/A;   Past Surgical History:  Procedure Laterality Date   ABDOMINAL HYSTERECTOMY     complete   CHOLECYSTECTOMY N/A 10/03/2023   Procedure: LAPAROSCOPIC CHOLECYSTECTOMY;  Surgeon: Belinda Cough, MD;  Location: [facility] OR;  Service: General;  Laterality: N/A;  Assit: RNFA   COLONOSCOPY N/A 01/16/2017   Procedure: COLONOSCOPY;  Surgeon: Rollin Dover, MD;  Location: [facility];  Service: Endoscopy;  Laterality: N/A;   ESOPHAGOGASTRODUODENOSCOPY N/A 01/16/2017   Procedure: ESOPHAGOGASTRODUODENOSCOPY (EGD);  Surgeon: Rollin Dover, MD;  Location: [facility];  Service: Endoscopy;  Laterality: N/A;   INTRAOPERATIVE CHOLANGIOGRAM N/A 10/03/2023   Procedure: INTRAOPERATIVE CHOLANGIOGRAM;  Surgeon: Belinda Cough, MD;  Location: [facility] OR;  Service: General;  Laterality: N/A;   Past Medical History:  Diagnosis Date   Anxiety    Arthritis    Back pain    l 4 and l5 djd   Depression    Fatty liver    GERD (gastroesophageal reflux disease)    Hypercholesteremia    Hypertension    Migraine    Pre-diabetes    BP 123/80   Pulse 60   Ht 5' 4 (1.626 m)   Wt 270 lb (122.5 kg)   SpO2 95%   BMI 46.35 kg/m   Opioid Risk Score:   Fall Risk Score:  `1  Depression screen [facility] 2/9     03/10/2024   10:42 AM 01/17/2024    1:43 PM 11/27/2023   11:25 AM 11/15/2023   11:20 AM 08/27/2023    1:04 PM 08/09/2023    8:41 AM 06/14/2023    9:51 AM  Depression screen PHQ 2/9  Decreased Interest 0 0 0 0 0 0 1  Down, Depressed, Hopeless  0 0 0 0 0 1  PHQ - 2 Score 0 0 0 0 0 0 2  Altered sleeping    3     Tired, decreased energy    3      Change in appetite    1     Feeling bad or failure about yourself     0     Trouble concentrating    0     Moving slowly or fidgety/restless    0     Suicidal thoughts    0     PHQ-9 Score    7     Difficult doing work/chores  Not difficult at all        Review of Systems  Musculoskeletal:  Positive for back pain.       B/L knee and hip pain  All other systems reviewed and are negative.      Objective:   Physical Exam Vitals and nursing note reviewed.  Constitutional:      Appearance: Normal appearance. She is obese.  Cardiovascular:     Rate and Rhythm: Normal rate and regular rhythm.     Pulses: Normal pulses.     Heart sounds: Normal heart sounds.  Pulmonary:     Effort: Pulmonary effort is normal.     Breath sounds: Normal breath sounds.  Musculoskeletal:     Comments: Normal Muscle Bulk and Muscle Testing Reveals:  Upper Extremities: Full ROM and Muscle Strength 5/5 Lumbar Paraspinal Tenderness: L-3-L-5 Lower Extremities: Decreased ROM and Muscle Strength 5/5 Right Lower Extremity Flexion Produces Pain into her  Left Lumbar Left Lower Extremity Flexion Produces Pain into her Right Lumbar and Right Hip Arises from Table  with Ease Narrow Based  Gait     Skin:    General: Skin is warm and dry.  Neurological:     Mental Status: She is alert and oriented to person, place, and time.  Psychiatric:        Mood and Affect: Mood normal.        Behavior: Behavior normal.          Assessment & Plan:  Chronic Low Back Pain without Sciatica/ Right Lumbar Radiculitis: Continue Gabapentin  .Continue HEP as Tolerated. Continue to monitor. 04/15/2024 Bilateral Greater Trochanter Tenderness: Scheduled for Bilateral hip injection. Continue to Alternate Ice and Heat Therapy. Continue to Monitor. 04/15/2024 Bilateral Primary Osteoarthritis of Bilateral Knees: S/P Synvisc Injection with Dr Lorilee on 01/03/2024, with good relief noted . Continue  HEP as Tolerated . Continue  current medication regimen as prescribed. Continue to Monitor. 04/15/2024 Chronic Pain Syndrome: Refilled  Hydrocodone  10mg /325 one tablet 4 times a day as needed for pain. #100.  We will continue the opioid monitoring program, this consists of regular clinic visits, examinations, urine drug screen, pill counts as well as use of [place]  Controlled Substance Reporting system. A 12 month History has been reviewed on the [place]  Controlled Substance Reporting System on 04/15/2024 5.Morbid Obesity: Continue Healthy Diet Regimen and Continue with HEP as Tolerated. SABRAPCP Following.  04/15/2024 Migraine without Aura: No complaints today. Continue current medication regimen. Continue to Monitor. 04/15/2024.     F/U in 2 months

## 2024-04-20 ENCOUNTER — Encounter: Payer: Self-pay | Admitting: Registered Nurse

## 2024-04-24 ENCOUNTER — Encounter (HOSPITAL_BASED_OUTPATIENT_CLINIC_OR_DEPARTMENT_OTHER): Payer: Self-pay | Admitting: Internal Medicine

## 2024-04-24 DIAGNOSIS — G4733 Obstructive sleep apnea (adult) (pediatric): Secondary | ICD-10-CM

## 2024-06-02 ENCOUNTER — Encounter: Attending: Physical Medicine and Rehabilitation | Admitting: Physical Medicine and Rehabilitation

## 2024-06-02 DIAGNOSIS — M170 Bilateral primary osteoarthritis of knee: Secondary | ICD-10-CM | POA: Diagnosis not present

## 2024-06-02 MED ORDER — HYDROCODONE-ACETAMINOPHEN 10-325 MG PO TABS: 1.0000 | ORAL_TABLET | Freq: Two times a day (BID) | ORAL | 0 refills | Status: AC | PRN

## 2024-06-02 NOTE — Progress Notes (Signed)
 Subjective:    Patient ID: Kelsey Santana, female    DOB: [birth year], 63 y.o.   MRN: [id number]  HPI:  An audio/video tele-health visit is felt to be the most appropriate encounter for this patient at this time. This is a follow up tele-visit via phone. The patient is at home. MD is at office. Prior to scheduling this appointment, our staff discussed the limitations of evaluation and management by telemedicine and the availability of in-person appointments. The patient expressed understanding and agreed to proceed.   1) Bilateral knee pain -they had to help her out of church because of her knee pain -[place] the [place] -would like to get an XR -she would like bilateral steroid injections -norco helps but makes her sleepy -she would like to maintain her decreased frequency of hydrocodone  at 5mg  BID -left knee is feeling better, she would like to schedule a right knee steroid injection  2) obesity RAIZA KIESEL is a 63 y.o. female who returns for f/u appointment for chronic pain secondary to bilateral knee osteoarthritis, obesity, and migraines. She states her pain is located in her lower back radiating into her right lower extremity, bilateral hip pain R>L and bilateral knee pain R>L. She rates her pain 7. Her current exercise regime is walking and performing stretching exercises. -she lost a lot of weight, she has gone from 213 to 247 lbs and weight loss has since stalled -she is continuing on the Ozempic  2mg  -she had stopped eating sugars,  -right knee is feeling better but left is hurting more. She does not want a knee replacement -right knee aches more at night -can't climb up stairs -she has to order groceries from [employer] -she has lost 77 lbs with the [facility] -she is trying to find someone to prescribe the Mounjaro for her -she was started on naltrexone 50mg  daily for weight loss  Ms. Eckhart Morphine  equivalent is 33.33 MME.  She is upset about about her  weight gain and is consider bypass surgery, has lost weight with semalglutide but weight loss has plateued -she is mostly eating fruits now, not eating much protein or vegetables -CBGs dropped while taking semalgutide and metformin  so she stopped metformin  -CBGs now stable -she is only eating one meal per day -has diffuse bone pain Knee pain has been severe She has been sleeping poorly at night She has [facility] [employer] She is having severe migraines that have been responsive to Migranol in the past. She has failed antidepressants, Depakote, opioids, topamax , Wellbutrin . She has noticied that opioids could trigger migraines when she is further from the dose. She has been taught elimination diet.  -has been very severe -she had trouble walking out of [employer] recently -she is not sure if the Zilretta  was as effective as the steroid injections but would still like to try next visit  -she is considering gastric sleeve -she has to make herself eat -she has stopped sodas.  -she is trying to eat more protein -she bought Ensure Max -drinks water all day -lost 14 lbs since starting Ozempic ! -only can take a couple of bites until she is full -she is trying to avoid bread, meat.  -she would like to try a medication -she does not want to try phenteramine when I shared that I have a patient who tried it and had a stroke -she would like to try Ozempic  as she heard a friend lost a lot of weight on it and  it curbed his appetite -she has been trying to eat healthier   3) Insomnia:  -sleeping poorly due to her pain and grief over son's death  4) Lower back pain: -low back pain present  5) Bilateral hip pain: -thinks this is stemming from her knee pain  6) Impaired balance: -interested in rehab   Pain Inventory Average Pain 10 Pain Right Now 8 My pain is constant, sharp, and aching stabbing  In the last 24 hours, has pain interfered with the following? General activity  Relation with  others  Enjoyment of life 1 What TIME of day is your pain at its worst? morning , daytime, evening, and night Sleep (in general) Poor  Pain is worse with: walking bending sitting standing Pain improves with: medication and injections rest Relief from Meds:   Family History  Problem Relation Age of Onset   Alcohol abuse Mother    Diabetes Mother    Hyperlipidemia Mother    Hypertension Mother    COPD Mother    Diabetes Brother    Hyperlipidemia Brother    Hypertension Brother    Depression Brother    Stroke Brother    Thyroid  disease Neg Hx    Social History   Socioeconomic History   Marital status: Single    Spouse name: Not on file   Number of children: Not on file   Years of education: Not on file   Highest education level: Not on file  Occupational History   Not on file  Tobacco Use   Smoking status: Former   Smokeless tobacco: Former   Tobacco comments:    quit 28 yrs ago  Psychologist, educational Use   Vaping status: Never Used  Substance and Sexual Activity   Alcohol use: No   Drug use: No   Sexual activity: Never  Other Topics Concern   Not on file  Social History Narrative   epworth sleepiness scale = 10 (11/15/15)    Lives alone in a 2 story home.  Has 3 children.  Currently not working.  Has lost 6 jobs in the past 2 months due to her leg numbness and pain.     Education: 2 years of college.   Social Drivers of Corporate investment banker Strain: Low Risk  (11/15/2023)   Overall Financial Resource Strain (CARDIA)    Difficulty of Paying Living Expenses: Not hard at all  Food Insecurity: No Food Insecurity (11/15/2023)   Hunger Vital Sign    Worried About Running Out of Food in the Last Year: Never true    Ran Out of Food in the Last Year: Never true  Transportation Needs: No Transportation Needs (11/15/2023)   PRAPARE - Administrator, Civil Service (Medical): No    Lack of Transportation (Non-Medical): No  Physical Activity: Insufficiently Active  (11/15/2023)   Exercise Vital Sign    Days of Exercise per Week: 7 days    Minutes of Exercise per Session: 20 min  Stress: No Stress Concern Present (11/15/2023)   [employer] of Occupational Health - Occupational Stress Questionnaire    Feeling of Stress : Not at all  Social Connections: Unknown (11/15/2023)   Social Connection and Isolation Panel    Frequency of Communication with Friends and Family: More than three times a week    Frequency of Social Gatherings with Friends and Family: More than three times a week    Attends Religious Services: More than 4 times per year    Active  Member of Clubs or Organizations: Yes    Attends Engineer, structural: More than 4 times per year    Marital Status: Not on file   Past Surgical History:  Procedure Laterality Date   ABDOMINAL HYSTERECTOMY     complete   CHOLECYSTECTOMY N/A 10/03/2023   Procedure: LAPAROSCOPIC CHOLECYSTECTOMY;  Surgeon: Belinda Cough, MD;  Location: [facility] OR;  Service: General;  Laterality: N/A;  Assit: RNFA   COLONOSCOPY N/A 01/16/2017   Procedure: COLONOSCOPY;  Surgeon: Rollin Dover, MD;  Location: [facility];  Service: Endoscopy;  Laterality: N/A;   ESOPHAGOGASTRODUODENOSCOPY N/A 01/16/2017   Procedure: ESOPHAGOGASTRODUODENOSCOPY (EGD);  Surgeon: Rollin Dover, MD;  Location: [facility];  Service: Endoscopy;  Laterality: N/A;   INTRAOPERATIVE CHOLANGIOGRAM N/A 10/03/2023   Procedure: INTRAOPERATIVE CHOLANGIOGRAM;  Surgeon: Belinda Cough, MD;  Location[facility] OR;  Service: General;  Laterality: N/A;   Past Surgical History:  Procedure Laterality Date   ABDOMINAL HYSTERECTOMY     complete   CHOLECYSTECTOMY N/A 10/03/2023   Procedure: LAPAROSCOPIC CHOLECYSTECTOMY;  Surgeon: Belinda Cough, MD;  Location: [facility] OR;  Service: General;  Laterality: N/A;  Assit: RNFA   COLONOSCOPY N/A 01/16/2017   Procedure: COLONOSCOPY;  Surgeon: Rollin Dover, MD;  Location: [facility];  Service: Endoscopy;  Laterality: N/A;    ESOPHAGOGASTRODUODENOSCOPY N/A 01/16/2017   Procedure: ESOPHAGOGASTRODUODENOSCOPY (EGD);  Surgeon: Rollin Dover, MD;  Location: [facility];  Service: Endoscopy;  Laterality: N/A;   INTRAOPERATIVE CHOLANGIOGRAM N/A 10/03/2023   Procedure: INTRAOPERATIVE CHOLANGIOGRAM;  Surgeon: Belinda Cough, MD;  Location: [facility] OR;  Service: General;  Laterality: N/A;   Past Medical History:  Diagnosis Date   Anxiety    Arthritis    Back pain    l 4 and l5 djd   Depression    Fatty liver    GERD (gastroesophageal reflux disease)    Hypercholesteremia    Hypertension    Migraine    Pre-diabetes    There were no vitals taken for this visit.  Opioid Risk Score:   Fall Risk Score:  `1  Depression screen [facility] 2/9     03/10/2024   10:42 AM 01/17/2024    1:43 PM 11/27/2023   11:25 AM 11/15/2023   11:20 AM 08/27/2023    1:04 PM 08/09/2023    8:41 AM 06/14/2023    9:51 AM  Depression screen PHQ 2/9  Decreased Interest 0 0 0 0 0 0 1  Down, Depressed, Hopeless  0 0 0 0 0 1  PHQ - 2 Score 0 0 0 0 0 0 2  Altered sleeping    3     Tired, decreased energy    3     Change in appetite    1     Feeling bad or failure about yourself     0     Trouble concentrating    0     Moving slowly or fidgety/restless    0     Suicidal thoughts    0     PHQ-9 Score    7     Difficult doing work/chores    Not difficult at all       Review of Systems  Musculoskeletal:  Positive for back pain, gait problem and joint swelling.       Bilateral knee pain  All other systems reviewed and are negative.      Objective:  Gen: no distress, normal appearing HEENT: oral mucosa pink and moist, NCAT Cardio: Reg rate Chest: normal  effort, normal rate of breathing Abd: soft, non-distended Ext: no edema Psych: pleasant, normal affect Skin: intact MSK: bilateral knee TTP, stable 4/8    Assessment & Plan:  Right Lumbar Radiculitis: Continue Gabapentin  . Continue HEP as Tolerated. Continue to monitor. XR ordered -Discussed  Qutenza  as an option for neuropathic pain control. Discussed that this is a capsaicin  patch, stronger than capsaicin  cream. Discussed that it is currently approved for diabetic peripheral neuropathy and post-herpetic neuralgia, but that it has also shown benefit in treating other forms of neuropathy. Provided patient with link to site to learn more about the patch: https://www.qutenza .com/. Discussed that the patch would be placed in office and benefits usually last 3 months. Discussed that unintended exposure to capsaicin  can cause severe irritation of eyes, mucous membranes, respiratory tract, and skin, but that Qutenza  is a local treatment and does not have the systemic side effects of other nerve medications. Discussed that there may be pain, itching, erythema, and decreased sensory function associated with the application of Qutenza . Side effects usually subside within 1 week. A cold pack of analgesic medications can help with these side effects. Blood pressure can also be increased due to pain associated with administration of the patch.   Bilateral Greater Trochanter Tenderness: Continue to Alternate Ice and Heat Therapy. Continue to Monitor. X-rays ordered to assess for OA Prescribing Home Zynex NexWave Stimulator Device and supplies as needed. IFC, NMES and TENS medically necessary Treatment Rx: Daily @ 30-40 minutes per treatment PRN. Zynex NexWave only, no substitutions. Treatment Goals: 1) To reduce and/or eliminate pain 2) To improve functional capacity and Activities of daily living 3) To reduce or prevent the need for oral medications 4) To improve circulation in the injured region 5) To decrease or prevent muscle spasm and muscle atrophy 6) To provide a self-management tool to the patient The patient has not sufficiently improved with conservative care. Numerous studies indexed by Medline and PubMed.gov have shown Neuromuscular, Interferential, and TENS stimulators to reduce pain, improve  function, and reduce medication use in injured patients. Continued use of this evidence based, safe, drug free treatment is both reasonable and medically necessary at this time.   Bilateral Primary Osteoarthritis of Bilateral Knees: Continue  HEP as Tolerated . Continue to Monitor. Discussed that continued weight loss with also help her knee pain.  inserted into the knee joint.  -discussed that Synvisc and GLP-1 has helped her knee OA improved -discussed her right knee, hip, and back pain, discussed that she would like to maintain a reduced frequency of hydrocodone  at 10mg  BID prn, discussed that steroid injections have been helping and she would like to try another for her right knee next visit  Chronic Pain Syndrome 2/2 knee OA: continue hydrocodone  to 10mg  QID prn We will continue the opioid monitoring program, this consists of regular clinic visits, examinations, urine drug screen, pill counts as well as use of [place]  Controlled Substance Reporting system.  UDS reviewed and with expected metabilities  5. Obesity: -continue naltrexone 50mg  daily  -continue resistance training, continue weight training -recommended avoiding MSG -continue dancing -weight is 274 lbs, discussed that she is doing a great job with her diet! -recommended Paris Harvest MD to get her tirzepatide   -encouraged resistance training -advised to make sure her morning protein shake has no added sugar or artifical sweeteners other than allulose or stevia Discussed following criteria for Wegovy: 1) Patient has diagnosis of obesity; 2) Patient must be 24 years of age or older; 3) The  patient has been involved in a physician or dietitian monitored weight loss program, consisting of both low-calorie diet, increased physical activity and behavioral counseling for a minimum of 6 months without a 3% loss from baseline; 4) Patients BMI is one of the following: a) 30kg/m or greater; b) 27 29.99 kg/m in the presence of at least one  weight related comorbidity such as coronary heart disease, dyslipidemia, hypertension, symptomatic arthritis of the lower extremities, type 2 diabetes mellitus, obstructive sleep apnea; c) 25-29.9 kg/m2 and waist circumference is &gt; 40 inches for males or &gt; 35 inches for females;5) Not Met - Must have tried and failed at least one preferred oral agent such as Phentermine; 6) Patient has no labeled contraindication; 7) Patient is not taking another weight loss medication; 8) The dose is within approved product labeling guidelines; 10) The patient must not be taking another GLP-1 receptor agonist AND the patient must not be taking insulin concurrently.  -encouraged stopping eating added sugars -continue nuts -discussed eating protein daily -continue avoiding bread -discussed that she likes chips -discussed foods that naturally increase GLP-1 -continue shrimp as source of protein. Mercury in salmon may be contributing to her joint symptoms. Advised to stop sodas and made plan to do so. She did not tolerate topamax  well. Discussed gastric bypass surgery and gastric sleeve -prescribed ozempic , discussed its mechanism of action, side effects, increase dose 2mg  -Discussed the benefits of intermittent fasting. Recommended starting with pushing dinner 15 minutes earlier and when this feels easy, continuing to push dinner 15 minutes earlier. Discussed that this can help her body to improve its ability to burn fat rather than glucose, improving insulin sensitivity. Recommended drinking Roobois tea in the evening to help curb appetite and for its numerous health benefits.   -discussed we can consider increasing metformin  if she does not tolerate ozempic   6. Insomnia: -Try to go outside near sunrise -Get exercise during the day.  -Turn off all devices an hour before bedtime.  -Teas that can benefit: chamomile, valerian root, Brahmi (Bacopa) -Can consider over the counter melatonin, magnesium, and/or  L-theanine. Melatonin is an anti-oxidant with multiple health benefits. Magnesium is involved in greater than 300 enzymatic reactions in the body and most of us  are deficient as our soil is often depleted. There are 7 different types of magnesium- Bioptemizer's is a supplement with all 7 types, and each has unique benefits. Magnesium can also help with constipation and anxiety.  -Pistachios naturally increase the production of melatonin -Cozy Earth bamboo bed sheets are free from toxic chemicals.  -Tart cherry juice or a tart cherry supplement can improve sleep and soreness post-workout   7. Migraines:  -continue migranol for her headaches- this has worked for her in the past and she has failed multiple other medications (listed above)  8) Vitamin D  deficiency: Continue ergocalciferol  50,000U once per week for 14 weeks.   9) HTN: Continue amlodipine  and losartan  -asked nursing to recheck BP -discussed with patient that may be elevated because she did not take her BP medication this morning -Advised checking BP daily at home and logging results to bring into follow-up appointment with PCP and myself. -Reviewed BP meds today.  -Advised regarding healthy foods that can help lower blood pressure and provided with a list: 1) citrus foods- high in vitamins and minerals 2) salmon and other fatty fish - reduces inflammation and oxylipins 3) swiss chard (leafy green)- high level of nitrates 4) pumpkin seeds- one of the best natural sources of magnesium  5) Beans and lentils- high in fiber, magnesium, and potassium 6) Berries- high in flavonoids 7) Amaranth (whole grain, can be cooked similarly to rice and oats)- high in magnesium and fiber 8) Pistachios- even more effective at reducing BP than other nuts 9) Carrots- high in phenolic compounds that relax blood vessels and reduce inflammation 10) Celery- contain phthalides that relax tissues of arterial walls 11) Tomatoes- can also improve  cholesterol and reduce risk of heart disease 12) Broccoli- good source of magnesium, calcium , and potassium 13) Greek yogurt: high in potassium and calcium  14) Herbs and spices: Celery seed, cilantro, saffron, lemongrass, black cumin, ginseng, cinnamon, cardamom, sweet basil, and ginger 15) Chia and flax seeds- also help to lower cholesterol and blood sugar 16) Beets- high levels of nitrates that relax blood vessels  17) spinach and bananas- high in potassium  -Provided lise of supplements that can help with hypertension:  1) magnesium: one high quality brand is Bioptemizers since it contains all 7 types of magnesium, otherwise over the counter magnesium gluconate 400mg  is a good option 2) B vitamins 3) vitamin D  4) potassium 5) CoQ10 6) L-arginine 7) Vitamin C 8) Beetroot -Educated that goal BP is 120/80. -Made goal to incorporate some of the above foods into diet.     10) Impaired balance -referred to PT  11) HLD:  Provided with list of supplements that can help with dyslipidemia: 1) Vitamin B3 500-4,000mg  in divided doses daily (would recommend starting low as can cause uncomfortable facial flushing if started at too high a dose) 2) Phytosterols 2.15 grams daily 3) Fermented soy 30-50 grams daily 4) EGCG (found in green tea): 500-1000mg  daily 5) Omega-3 fatty acids 3000-5,000mg  daily 6) Flax seed 40 grams daily 7) Monounsaturated fats 20-40 grams daily (olives, olive oil, nuts), also reduces cardiovascular disease 8) Sesame: 40 grams daily 9) Gamma/delta tocotrienols- a family of unsaturated forms of Vitamin E- 200mg  with dinner 10) Pantethine 900mg  daily in divided doses 11) Resveratrol 250mg  daily 12) N Acetyl Cysteine 2000mg  daily in divided doses 13) Curcumin 2000-5000mg  in divided doses daily 14) Pomegranate juice: 8 ounces daily, also helps to lower blood pressure 15) Pomegranate seeds one cup daily, also helps to lower blood pressure 16) Citrus Bergamot 1000mg   daily, also helps with glucose control and weight loss 17) Vitamin C 500mg  daily 18) Quercetin 500-1000mg  daily 19) Glutathione 20) Probiotics 60-100 billion organisms per day 21) Fiber 22) Oats 23) Aged garlic (can eat as food or supplement of 600-900mg  per day) 24) Chia seeds 25 grams per day 25) Lycopene- carotenoid found in high concentrations in tomatoes. 26) Alpha linolenic acid 27) Flavonoids and anthocyanins 28) Wogonin- flavanoid that enhances reverse cholesterol transport 29) Coenzyme Q10 30) Pantethine- derivative of Vitamin B5: 300mg  three times per day or 450mg  twice per day with or without food 31) Barley and other whole grains 32) Orange juice 33) L- carnitine 34) L- Lysine 35) L- Arginine 36) Almonds 37) Morin 38) Rutin 39) Carnosine 40) Histidine  41) Kaempferol  42) Organosulfur compounds 43) Vitamin E 44) Oleic acid 45) RBO (ferulic acid gammaoryzanol) 46) grape seed extract 47) Red wine 48) Berberine HCL 500mg  daily or twice per day- more effective and with fewer adverse effects that ezetimibe monotherapy 49) red yeast rice 2400- 4800 mg/day 50) chlorella 51) Licorice   12 minutes spent in discussion of her right knee, hip, and back pain, discussed that she would like to maintain a reduced frequency of hydrocodone  at 10mg  BID prn, discussed  that steroid injections have been helping and she would like to try another for her right knee next visit, discussed that nalrexone 50mg  daily has been helping with weight loss, discussed that this can negate the effects of the hydrocodone 

## 2024-06-03 ENCOUNTER — Telehealth: Payer: Self-pay | Admitting: Diagnostic Neuroimaging

## 2024-06-03 NOTE — Telephone Encounter (Signed)
 Received sleep referral from [facility] for OSA. Placed in sleep referrals box

## 2024-06-13 ENCOUNTER — Ambulatory Visit (HOSPITAL_BASED_OUTPATIENT_CLINIC_OR_DEPARTMENT_OTHER): Admitting: Physical Therapy

## 2024-06-18 NOTE — Telephone Encounter (Signed)
 [facility] stating [facility] attempt for same diagnosis. Placed in sleep mailbox

## 2024-06-19 ENCOUNTER — Telehealth: Payer: Self-pay

## 2024-06-19 NOTE — Telephone Encounter (Signed)
 [facility] to contact [facility]x about the referral for this patient. Patient has completed a SS with another sleep provider this year, patient needs to return to [facility] for sleep care

## 2024-07-08 ENCOUNTER — Encounter: Attending: Physical Medicine and Rehabilitation | Admitting: Registered Nurse

## 2024-07-08 ENCOUNTER — Encounter: Payer: Self-pay | Admitting: Registered Nurse

## 2024-07-08 VITALS — BP 147/85 | HR 60 | Ht 64.0 in | Wt 264.2 lb

## 2024-07-08 DIAGNOSIS — Z79891 Long term (current) use of opiate analgesic: Secondary | ICD-10-CM | POA: Diagnosis present

## 2024-07-08 DIAGNOSIS — Z5181 Encounter for therapeutic drug level monitoring: Secondary | ICD-10-CM | POA: Diagnosis present

## 2024-07-08 DIAGNOSIS — G894 Chronic pain syndrome: Secondary | ICD-10-CM | POA: Insufficient documentation

## 2024-07-08 DIAGNOSIS — G8929 Other chronic pain: Secondary | ICD-10-CM | POA: Insufficient documentation

## 2024-07-08 DIAGNOSIS — M1711 Unilateral primary osteoarthritis, right knee: Secondary | ICD-10-CM | POA: Diagnosis present

## 2024-07-08 DIAGNOSIS — M545 Low back pain, unspecified: Secondary | ICD-10-CM | POA: Diagnosis present

## 2024-07-08 NOTE — Progress Notes (Signed)
 Subjective:    Patient ID: Kelsey Santana, female    DOB: 03-Jan-1961, 63 y.o.   MRN: [id number]  HPI: Kelsey Santana is a 63 y.o. female who returns for follow up appointment for chronic pain and medication refill. She states her pain is located in her lower back and right knee pain. She rates her pain 4. Her current exercise regime is walking short distances  and performing stretching exercises.  Ms. Mcduffey Morphine  equivalent is 40.00 MME., she reports she is using her Hydrocodone  sparingly, no prescription given. She will call or send My-Chart when she needs a refill, she verbalizes understanding.      Oral Swab was Performed today.    Pain Inventory Average Pain 10 Pain Right Now 4 My pain is sharp, stabbing, tingling, and aching  In the last 24 hours, has pain interfered with the following? General activity 5 Relation with others 5 Enjoyment of life 5 What TIME of day is your pain at its worst? morning , daytime, evening, and night Sleep (in general) Poor  Pain is worse with: walking, bending, sitting, inactivity, standing, and some activites Pain improves with: rest, heat/ice, therapy/exercise, pacing activities, medication, and injections Relief from Meds: 10  Family History  Problem Relation Age of Onset   Alcohol abuse Mother    Diabetes Mother    Hyperlipidemia Mother    Hypertension Mother    COPD Mother    Diabetes Brother    Hyperlipidemia Brother    Hypertension Brother    Depression Brother    Stroke Brother    Thyroid  disease Neg Hx    Social History   Socioeconomic History   Marital status: Single    Spouse name: Not on file   Number of children: Not on file   Years of education: Not on file   Highest education level: Not on file  Occupational History   Not on file  Tobacco Use   Smoking status: Former   Smokeless tobacco: Former   Tobacco comments:    quit 28 yrs ago  Psychologist, Educational Use   Vaping status: Never Used  Substance and Sexual  Activity   Alcohol use: No   Drug use: No   Sexual activity: Never  Other Topics Concern   Not on file  Social History Narrative   epworth sleepiness scale = 10 (11/15/15)    Lives alone in a 2 story home.  Has 3 children.  Currently not working.  Has lost 6 jobs in the past 2 months due to her leg numbness and pain.     Education: 2 years of college.   Social Drivers of Health   Financial Resource Strain: Medium Risk (07/02/2024)   Received from [facility]   Overall Financial Resource Strain (CARDIA)    How hard is it for you to pay for the very basics like food, housing, medical care, and heating?: Somewhat hard  Food Insecurity: Food Insecurity Present (07/02/2024)   Received from [facility]   Hunger Vital Sign    Within the past 12 months, you worried that your food would run out before you got the money to buy more.: Sometimes true    Within the past 12 months, the food you bought just didn't last and you didn't have money to get more.: Sometimes true  Transportation Needs: No Transportation Needs (07/02/2024)   Received from [facility] - Transportation    In the past 12 months, has lack of transportation kept  you from medical appointments or from getting medications?: No    In the past 12 months, has lack of transportation kept you from meetings, work, or from getting things needed for daily living?: No  Physical Activity: Inactive (07/02/2024)   Received from [facility]   Exercise Vital Sign    On average, how many days per week do you engage in moderate to strenuous exercise (like a brisk walk)?: 0 days    Minutes of Exercise per Session: Not on file  Stress: No Stress Concern Present (07/02/2024)   Received from [facility] - Occupational Stress Questionnaire    Do you feel stress - tense, restless, nervous, or anxious, or unable to sleep at night because your mind is troubled all the time - these days?:  Only a little  Social Connections: Socially Isolated (07/02/2024)   Received from [facility]   Social Network    How would you rate your social network (family, work, friends)?: Little participation, lonely and socially isolated   Past Surgical History:  Procedure Laterality Date   ABDOMINAL HYSTERECTOMY     complete   CHOLECYSTECTOMY N/A 10/03/2023   Procedure: LAPAROSCOPIC CHOLECYSTECTOMY;  Surgeon: Belinda Cough, MD;  Location: [facility] OR;  Service: General;  Laterality: N/A;  Assit: RNFA   COLONOSCOPY N/A 01/16/2017   Procedure: COLONOSCOPY;  Surgeon: Rollin Dover, MD;  Location: [facility];  Service: Endoscopy;  Laterality: N/A;   ESOPHAGOGASTRODUODENOSCOPY N/A 01/16/2017   Procedure: ESOPHAGOGASTRODUODENOSCOPY (EGD);  Surgeon: Rollin Dover, MD;  Location: [facility];  Service: Endoscopy;  Laterality: N/A;   INTRAOPERATIVE CHOLANGIOGRAM N/A 10/03/2023   Procedure: INTRAOPERATIVE CHOLANGIOGRAM;  Surgeon: Belinda Cough, MD;  Location: [facility] OR;  Service: General;  Laterality: N/A;   Past Surgical History:  Procedure Laterality Date   ABDOMINAL HYSTERECTOMY     complete   CHOLECYSTECTOMY N/A 10/03/2023   Procedure: LAPAROSCOPIC CHOLECYSTECTOMY;  Surgeon: Belinda Cough, MD;  Location: [facility] OR;  Service: General;  Laterality: N/A;  Assit: RNFA   COLONOSCOPY N/A 01/16/2017   Procedure: COLONOSCOPY;  Surgeon: Rollin Dover, MD;  Location: [facility];  Service: Endoscopy;  Laterality: N/A;   ESOPHAGOGASTRODUODENOSCOPY N/A 01/16/2017   Procedure: ESOPHAGOGASTRODUODENOSCOPY (EGD);  Surgeon: Rollin Dover, MD;  Location: [facility];  Service: Endoscopy;  Laterality: N/A;   INTRAOPERATIVE CHOLANGIOGRAM N/A 10/03/2023   Procedure: INTRAOPERATIVE CHOLANGIOGRAM;  Surgeon: Belinda Cough, MD;  Location: [facility] OR;  Service: General;  Laterality: N/A;   Past Medical History:  Diagnosis Date   Anxiety    Arthritis    Back pain    l 4 and l5 djd   Depression    Fatty liver    GERD  (gastroesophageal reflux disease)    Hypercholesteremia    Hypertension    Migraine    Pre-diabetes    BP (!) 147/85 (BP Location: Right Arm, Patient Position: Sitting, Cuff Size: Large)   Pulse (!) 56   Ht 5' 4 (1.626 m)   Wt 264 lb 3.2 oz (119.8 kg)   SpO2 100%   BMI 45.35 kg/m   Opioid Risk Score:   Fall Risk Score:  `1  Depression screen [facility] 2/9     03/10/2024   10:42 AM 01/17/2024    1:43 PM 11/27/2023   11:25 AM 11/15/2023   11:20 AM 08/27/2023    1:04 PM 08/09/2023    8:41 AM 06/14/2023    9:51 AM  Depression screen PHQ 2/9  Decreased Interest 0 0 0 0 0  0 1  Down, Depressed, Hopeless  0 0 0 0 0 1  PHQ - 2 Score 0 0 0 0 0 0 2  Altered sleeping    3     Tired, decreased energy    3     Change in appetite    1     Feeling bad or failure about yourself     0     Trouble concentrating    0     Moving slowly or fidgety/restless    0     Suicidal thoughts    0     PHQ-9 Score    7      Difficult doing work/chores    Not difficult at all        Data saved with a previous flowsheet row definition       Review of Systems  Musculoskeletal:  Positive for arthralgias and back pain.       Low back pain, right knee pain       Objective:   Physical Exam Vitals and nursing note reviewed.  Constitutional:      Appearance: Normal appearance. She is obese.  Cardiovascular:     Rate and Rhythm: Bradycardia present.     Pulses: Normal pulses.     Heart sounds: Normal heart sounds.  Pulmonary:     Effort: Pulmonary effort is normal.     Breath sounds: Normal breath sounds.  Musculoskeletal:     Comments: Normal Muscle Bulk and Muscle Testing Reveals:  Upper Extremities: Full ROM and Muscle Strength 5/5 Lumbar Paraspinal Tenderness: L-4-L-5 Lower Extremities: Full ROM and Muscle Strength 5/5 Arises from Table slowly Narrow Based  Gait     Skin:    General: Skin is warm and dry.  Neurological:     Mental Status: She is alert and oriented to person, place, and time.   Psychiatric:        Mood and Affect: Mood normal.        Behavior: Behavior normal.          Assessment & Plan:  Chronic Low Back Pain without Sciatica/ Right Lumbar Radiculitis: Continue Gabapentin  .Continue HEP as Tolerated. Continue to monitor. 07/08/2024 Bilateral Greater Trochanter Tenderness: No complaints today.  Continue to Alternate Ice and Heat Therapy. Continue to Monitor. 07/08/2024 Bilateral Primary Osteoarthritis of Bilateral Knees: Scheduled for Right knee injection. . Continue  HEP as Tolerated . Continue current medication regimen as prescribed. Continue to Monitor. 07/08/2024 Chronic Pain Syndrome: Continue   Hydrocodone  10mg /325 one tablet twice a day as needed for pain. #60, no script given, she is using it sparingly.   We will continue the opioid monitoring program, this consists of regular clinic visits, examinations, urine drug screen, pill counts as well as use of [place]  Controlled Substance Reporting system. A 12 month History has been reviewed on the [place]  Controlled Substance Reporting System on 07/08/2024 5.Morbid Obesity: Continue Healthy Diet Regimen and Continue with HEP as Tolerated. SABRAPCP Following.  07/08/2024 6. Migraine without Aura: No complaints today. Continue current medication regimen. Continue to Monitor. 07/08/2024.     F/U with Dr Lorilee

## 2024-07-11 ENCOUNTER — Ambulatory Visit (HOSPITAL_BASED_OUTPATIENT_CLINIC_OR_DEPARTMENT_OTHER): Attending: Physical Medicine and Rehabilitation | Admitting: Physical Therapy

## 2024-07-11 LAB — DRUG TOX MONITOR 1 W/CONF, ORAL FLD

## 2024-07-11 LAB — DRUG TOX ALC METAB W/CON, ORAL FLD: Alcohol Metabolite: NEGATIVE ng/mL (ref <25)

## 2024-07-22 ENCOUNTER — Encounter: Attending: Physical Medicine and Rehabilitation | Admitting: Physical Medicine and Rehabilitation

## 2024-07-22 VITALS — BP 145/84 | HR 65 | Ht 64.0 in | Wt 283.0 lb

## 2024-07-22 DIAGNOSIS — M170 Bilateral primary osteoarthritis of knee: Secondary | ICD-10-CM | POA: Diagnosis present

## 2024-07-22 NOTE — Progress Notes (Signed)
 Subjective:    Patient ID: Kelsey Santana, female    DOB: October 30, 1960, 63 y.o.   MRN: [id number]  HPI:  An audio/video tele-health visit is felt to be the most appropriate encounter for this patient at this time. This is a follow up tele-visit via phone. The patient is at home. MD is at office. Prior to scheduling this appointment, our staff discussed the limitations of evaluation and management by telemedicine and the availability of in-person appointments. The patient expressed understanding and agreed to proceed.   1) Bilateral knee pain -would like to get steroid injections today -they had to help her out of church because of her knee pain -[place] -would like to get an XR -she would like bilateral steroid injections -norco helps but makes her sleepy -she would like to maintain her decreased frequency of hydrocodone  at 5mg  BID -left knee is feeling better, she would like to schedule a right knee steroid injection  2) obesity MIN TUNNELL is a 63 y.o. female who returns for f/u appointment for chronic pain secondary to bilateral knee osteoarthritis, obesity, and migraines. She states her pain is located in her lower back radiating into her right lower extremity, bilateral hip pain R>L and bilateral knee pain R>L. She rates her pain 7. Her current exercise regime is walking and performing stretching exercises. -she lost a lot of weight, she has gone from 213 to 247 lbs and weight loss has since stalled -she is continuing on the Ozempic  2mg  -she had stopped eating sugars,  -right knee is feeling better but left is hurting more. She does not want a knee replacement -right knee aches more at night -can't climb up stairs -she has to order groceries from [place] -[employer]  is helping -she is eating celery -she is cooking instead of going out to eat -her weight is down 263 lbs! -she is trying to find someone to prescribe the Mounjaro for her -she was  started on naltrexone 50mg  daily for weight loss  Kelsey Santana Morphine  equivalent is 33.33 MME.  She is upset about about her weight gain and is consider bypass surgery, has lost weight with semalglutide but weight loss has plateued -she is mostly eating fruits now, not eating much protein or vegetables -CBGs dropped while taking semalgutide and metformin  so she stopped metformin  -CBGs now stable -she is only eating one meal per day -has diffuse bone pain Knee pain has been severe She has been sleeping poorly at night She [facility] [employer] She is having severe migraines that have been responsive to Migranol in the past. She has failed antidepressants, Depakote, opioids, topamax , Wellbutrin . She has noticied that opioids could trigger migraines when she is further from the dose. She has been taught elimination diet.  -has been very severe -she had trouble walking out of [employer] recently -she is not sure if the Zilretta  was as effective as the steroid injections but would still like to try next visit  -she is considering gastric sleeve -she has to make herself eat -she has stopped sodas.  -she is trying to eat more protein -she bought Ensure Max -drinks water all day -lost 14 lbs since starting Ozempic ! -only can take a couple of bites until she is full -she is trying to avoid bread, meat.  -she would like to try a medication -she does not want to try phenteramine when I shared that I have a patient who tried it and had  a stroke -she would like to try Ozempic  as she heard a friend lost a lot of weight on it and it curbed his appetite -she has been trying to eat healthier   3) Insomnia:  -sleeping poorly due to her pain and grief over son's death  4) Lower back pain: -low back pain present  5) Bilateral hip pain: -thinks this is stemming from her knee pain  6) Impaired balance: -interested in rehab   Pain Inventory Average Pain 10 Pain Right Now 8 My pain is constant,  sharp, and aching stabbing  In the last 24 hours, has pain interfered with the following? General activity  Relation with others  Enjoyment of life 1 What TIME of day is your pain at its worst? morning , daytime, evening, and night Sleep (in general) Poor  Pain is worse with: walking bending sitting standing Pain improves with: medication and injections rest Relief from Meds:   Family History  Problem Relation Age of Onset   Alcohol abuse Mother    Diabetes Mother    Hyperlipidemia Mother    Hypertension Mother    COPD Mother    Diabetes Brother    Hyperlipidemia Brother    Hypertension Brother    Depression Brother    Stroke Brother    Thyroid  disease Neg Hx    Social History   Socioeconomic History   Marital status: Single    Spouse name: Not on file   Number of children: Not on file   Years of education: Not on file   Highest education level: Not on file  Occupational History   Not on file  Tobacco Use   Smoking status: Former   Smokeless tobacco: Former   Tobacco comments:    quit 28 yrs ago  Psychologist, Educational Use   Vaping status: Never Used  Substance and Sexual Activity   Alcohol use: No   Drug use: No   Sexual activity: Never  Other Topics Concern   Not on file  Social History Narrative   epworth sleepiness scale = 10 (11/15/15)    Lives alone in a 2 story home.  Has 3 children.  Currently not working.  Has lost 6 jobs in the past 2 months due to her leg numbness and pain.     Education: 2 years of college.   Social Drivers of Health   Financial Resource Strain: Medium Risk (07/02/2024)   Received from [facility]   Overall Financial Resource Strain (CARDIA)    How hard is it for you to pay for the very basics like food, housing, medical care, and heating?: Somewhat hard  Food Insecurity: Food Insecurity Present (07/02/2024)   Received from [facility]   Hunger Vital Sign    Within the past 12 months, you worried that your food would run out before you  got the money to buy more.: Sometimes true    Within the past 12 months, the food you bought just didn't last and you didn't have money to get more.: Sometimes true  Transportation Needs: No Transportation Needs (07/02/2024)   Received from [facility] - Transportation    In the past 12 months, has lack of transportation kept you from medical appointments or from getting medications?: No    In the past 12 months, has lack of transportation kept you from meetings, work, or from getting things needed for daily living?: No  Physical Activity: Inactive (07/02/2024)   Received from [facility]   Exercise Vital  Sign    On average, how many days per week do you engage in moderate to strenuous exercise (like a brisk walk)?: 0 days    Minutes of Exercise per Session: Not on file  Stress: No Stress Concern Present (07/02/2024)   Received from [facility] - Occupational Stress Questionnaire    Do you feel stress - tense, restless, nervous, or anxious, or unable to sleep at night because your mind is troubled all the time - these days?: Only a little  Social Connections: Socially Isolated (07/02/2024)   Received from [facility]   Social Network    How would you rate your social network (family, work, friends)?: Little participation, lonely and socially isolated   Past Surgical History:  Procedure Laterality Date   ABDOMINAL HYSTERECTOMY     complete   CHOLECYSTECTOMY N/A 10/03/2023   Procedure: LAPAROSCOPIC CHOLECYSTECTOMY;  Surgeon: Belinda Cough, MD;  Location: [facility] OR;  Service: General;  Laterality: N/A;  Assit: RNFA   COLONOSCOPY N/A 01/16/2017   Procedure: COLONOSCOPY;  Surgeon: Rollin Dover, MD;  Location: [facility];  Service: Endoscopy;  Laterality: N/A;   ESOPHAGOGASTRODUODENOSCOPY N/A 01/16/2017   Procedure: ESOPHAGOGASTRODUODENOSCOPY (EGD);  Surgeon: Rollin Dover, MD;  Location: [facility];  Service: Endoscopy;  Laterality:  N/A;   INTRAOPERATIVE CHOLANGIOGRAM N/A 10/03/2023   Procedure: INTRAOPERATIVE CHOLANGIOGRAM;  Surgeon: Belinda Cough, MD;  [facility];  Service: General;  Laterality: N/A;   Past Surgical History:  Procedure Laterality Date   ABDOMINAL HYSTERECTOMY     complete   CHOLECYSTECTOMY N/A 10/03/2023   Procedure: LAPAROSCOPIC CHOLECYSTECTOMY;  Surgeon: Belinda Cough, MD;  Location: [facility] OR;  Service: General;  Laterality: N/A;  Assit: RNFA   COLONOSCOPY N/A 01/16/2017   Procedure: COLONOSCOPY;  Surgeon: Rollin Dover, MD;  Location: [facility];  Service: Endoscopy;  Laterality: N/A;   ESOPHAGOGASTRODUODENOSCOPY N/A 01/16/2017   Procedure: ESOPHAGOGASTRODUODENOSCOPY (EGD);  Surgeon: Rollin Dover, MD;  Location: [facility];  Service: Endoscopy;  Laterality: N/A;   INTRAOPERATIVE CHOLANGIOGRAM N/A 10/03/2023   Procedure: INTRAOPERATIVE CHOLANGIOGRAM;  Surgeon: Belinda Cough, MD;  Location: [facility] OR;  Service: General;  Laterality: N/A;   Past Medical History:  Diagnosis Date   Anxiety    Arthritis    Back pain    l 4 and l5 djd   Depression    Fatty liver    GERD (gastroesophageal reflux disease)    Hypercholesteremia    Hypertension    Migraine    Pre-diabetes    BP (!) 145/84   Pulse 65   Ht 5' 4 (1.626 m)   Wt 283 lb (128.4 kg)   SpO2 96%   BMI 48.58 kg/m   Opioid Risk Score:   Fall Risk Score:  `1  Depression screen [facility] 2/9     03/10/2024   10:42 AM 01/17/2024    1:43 PM 11/27/2023   11:25 AM 11/15/2023   11:20 AM 08/27/2023    1:04 PM 08/09/2023    8:41 AM 06/14/2023    9:51 AM  Depression screen PHQ 2/9  Decreased Interest 0 0 0 0 0 0 1  Down, Depressed, Hopeless  0 0 0 0 0 1  PHQ - 2 Score 0 0 0 0 0 0 2  Altered sleeping    3     Tired, decreased energy    3     Change in appetite    1     Feeling bad or failure about yourself  0     Trouble concentrating    0     Moving slowly or fidgety/restless    0     Suicidal thoughts    0     PHQ-9 Score    7       Difficult doing work/chores    Not difficult at all        Data saved with a previous flowsheet row definition    Review of Systems  Musculoskeletal:  Positive for back pain, gait problem and joint swelling.       Bilateral knee pain  All other systems reviewed and are negative.      Objective:  Gen: no distress, normal appearing HEENT: oral mucosa pink and moist, NCAT Cardio: Reg rate Chest: normal effort, normal rate of breathing Abd: soft, non-distended Ext: no edema Psych: pleasant, normal affect Skin: intact MSK: bilateral knee TTP, stable 4/8    Assessment & Plan:  Right Lumbar Radiculitis: Continue Gabapentin  . Continue HEP as Tolerated. Continue to monitor. XR ordered -Discussed Qutenza  as an option for neuropathic pain control. Discussed that this is a capsaicin  patch, stronger than capsaicin  cream. Discussed that it is currently approved for diabetic peripheral neuropathy and post-herpetic neuralgia, but that it has also shown benefit in treating other forms of neuropathy. Provided patient with link to site to learn more about the patch: https://www.qutenza .com/. Discussed that the patch would be placed in office and benefits usually last 3 months. Discussed that unintended exposure to capsaicin  can cause severe irritation of eyes, mucous membranes, respiratory tract, and skin, but that Qutenza  is a local treatment and does not have the systemic side effects of other nerve medications. Discussed that there may be pain, itching, erythema, and decreased sensory function associated with the application of Qutenza . Side effects usually subside within 1 week. A cold pack of analgesic medications can help with these side effects. Blood pressure can also be increased due to pain associated with administration of the patch.   Bilateral Greater Trochanter Tenderness: Continue to Alternate Ice and Heat Therapy. Continue to Monitor. X-rays ordered to assess for OA Prescribing Home Zynex  NexWave Stimulator Device and supplies as needed. IFC, NMES and TENS medically necessary Treatment Rx: Daily @ 30-40 minutes per treatment PRN. Zynex NexWave only, no substitutions. Treatment Goals: 1) To reduce and/or eliminate pain 2) To improve functional capacity and Activities of daily living 3) To reduce or prevent the need for oral medications 4) To improve circulation in the injured region 5) To decrease or prevent muscle spasm and muscle atrophy 6) To provide a self-management tool to the patient The patient has not sufficiently improved with conservative care. Numerous studies indexed by Medline and PubMed.gov have shown Neuromuscular, Interferential, and TENS stimulators to reduce pain, improve function, and reduce medication use in injured patients. Continued use of this evidence based, safe, drug free treatment is both reasonable and medically necessary at this time.   Bilateral Primary Osteoarthritis of Bilateral Knees: Continue  HEP as Tolerated . Continue to Monitor. Discussed that continued weight loss with also help her knee pain.  inserted into the knee joint.  Commended her on weaning off her opioids! -discussed that Synvisc and GLP-1 has helped her knee OA improved -discussed her right knee, hip, and back pain, discussed that she would like to maintain a reduced frequency of hydrocodone  at 10mg  BID prn, discussed that steroid injections have been helping and she would like to try another for her right knee next visit  Knee injection, right  Indication:Right knee pain not relieved by medication management and other conservative care.  Informed consent was obtained after describing risks and benefits of the procedure with the patient, this includes bleeding, bruising, infection and medication side effects. The patient wishes to proceed and has given written consent. The patient was placed in a recumbent position. The medial aspect of the knee was marked and prepped with Betadine and  alcohol. It was then entered with a 25-gauge 1-1/2 inch needle and 1 mL of 1% lidocaine  was injected into the skin and subcutaneous tissue. Then another needle was inserted into the knee joint. After negative draw back for blood, a solution containing one ML of 6mg  per mL betamethasone  and 3 mL of 1% lidocaine  were injected. The patient tolerated the procedure well. Post procedure instructions were given.   Chronic Pain Syndrome 2/2 knee OA: continue hydrocodone  to 10mg  QID prn We will continue the opioid monitoring program, this consists of regular clinic visits, examinations, urine drug screen, pill counts as well as use of [place]  Controlled Substance Reporting system.  UDS reviewed and with expected metabilities  5. Obesity: -continue vitamin D  -discussed that if she continues to lose weight she may not need knee placement since pain is improving! -continue milk thistle, garlic -continue eating a healthy diet -continue resistance training, continue weight training -recommended avoiding MSG -continue dancing -weight is 274 lbs, discussed that she is doing a great job with her diet! -recommended Paris Harvest MD to get her tirzepatide   -encouraged resistance training -advised to make sure her morning protein shake has no added sugar or artifical sweeteners other than allulose or stevia Discussed following criteria for [facility]: 1) Patient has diagnosis of obesity; 2) Patient must be 42 years of age or older; 3) The patient has been involved in a physician or dietitian monitored weight loss program, consisting of both low-calorie diet, increased physical activity and behavioral counseling for a minimum of 6 months without a 3% loss from baseline; 4) Patients BMI is one of the following: a) 30kg/m or greater; b) 27 29.99 kg/m in the presence of at least one weight related comorbidity such as coronary heart disease, dyslipidemia, hypertension, symptomatic arthritis of the lower extremities, type 2  diabetes mellitus, obstructive sleep apnea; c) 25-29.9 kg/m2 and waist circumference is &gt; 40 inches for males or &gt; 35 inches for females;5) Not Met - Must have tried and failed at least one preferred oral agent such as Phentermine; 6) Patient has no labeled contraindication; 7) Patient is not taking another weight loss medication; 8) The dose is within approved product labeling guidelines; 10) The patient must not be taking another GLP-1 receptor agonist AND the patient must not be taking insulin concurrently.  -encouraged stopping eating added sugars -continue nuts -discussed eating protein daily -continue avoiding bread -discussed that she likes chips -discussed foods that naturally increase GLP-1 -continue shrimp as source of protein. Mercury in salmon may be contributing to her joint symptoms. Advised to stop sodas and made plan to do so. She did not tolerate topamax  well. Discussed gastric bypass surgery and gastric sleeve -prescribed ozempic , discussed its mechanism of action, side effects, increase dose 2mg  -Discussed the benefits of intermittent fasting. Recommended starting with pushing dinner 15 minutes earlier and when this feels easy, continuing to push dinner 15 minutes earlier. Discussed that this can help her body to improve its ability to burn fat rather than glucose, improving insulin sensitivity. Recommended drinking Roobois tea in the evening to  help curb appetite and for its numerous health benefits.   -discussed we can consider increasing metformin  if she does not tolerate ozempic   6. Insomnia: -Try to go outside near sunrise -Get exercise during the day.  -Turn off all devices an hour before bedtime.  -Teas that can benefit: chamomile, valerian root, Brahmi (Bacopa) -Can consider over the counter melatonin, magnesium, and/or L-theanine. Melatonin is an anti-oxidant with multiple health benefits. Magnesium is involved in greater than 300 enzymatic reactions in the body and  most of us  are deficient as our soil is often depleted. There are 7 different types of magnesium- Bioptemizer's is a supplement with all 7 types, and each has unique benefits. Magnesium can also help with constipation and anxiety.  -Pistachios naturally increase the production of melatonin -Cozy Earth bamboo bed sheets are free from toxic chemicals.  -Tart cherry juice or a tart cherry supplement can improve sleep and soreness post-workout   7. Migraines:  -continue migranol for her headaches- this has worked for her in the past and she has failed multiple other medications (listed above)  8) Vitamin D  deficiency: Continue ergocalciferol  50,000U once per week for 14 weeks.   9) HTN: Continue amlodipine  and losartan  -asked nursing to recheck BP -discussed with patient that may be elevated because she did not take her BP medication this morning -Advised checking BP daily at home and logging results to bring into follow-up appointment with PCP and myself. -Reviewed BP meds today.  -Advised regarding healthy foods that can help lower blood pressure and provided with a list: 1) citrus foods- high in vitamins and minerals 2) salmon and other fatty fish - reduces inflammation and oxylipins 3) swiss chard (leafy green)- high level of nitrates 4) pumpkin seeds- one of the best natural sources of magnesium 5) Beans and lentils- high in fiber, magnesium, and potassium 6) Berries- high in flavonoids 7) Amaranth (whole grain, can be cooked similarly to rice and oats)- high in magnesium and fiber 8) Pistachios- even more effective at reducing BP than other nuts 9) Carrots- high in phenolic compounds that relax blood vessels and reduce inflammation 10) Celery- contain phthalides that relax tissues of arterial walls 11) Tomatoes- can also improve cholesterol and reduce risk of heart disease 12) Broccoli- good source of magnesium, calcium , and potassium 13) Greek yogurt: high in potassium and  calcium  14) Herbs and spices: Celery seed, cilantro, saffron, lemongrass, black cumin, ginseng, cinnamon, cardamom, sweet basil, and ginger 15) Chia and flax seeds- also help to lower cholesterol and blood sugar 16) Beets- high levels of nitrates that relax blood vessels  17) spinach and bananas- high in potassium  -Provided lise of supplements that can help with hypertension:  1) magnesium: one high quality brand is Bioptemizers since it contains all 7 types of magnesium, otherwise over the counter magnesium gluconate 400mg  is a good option 2) B vitamins 3) vitamin D  4) potassium 5) CoQ10 6) L-arginine 7) Vitamin C 8) Beetroot -Educated that goal BP is 120/80. -Made goal to incorporate some of the above foods into diet.     10) Impaired balance -referred to PT  11) HLD:  Provided with list of supplements that can help with dyslipidemia: 1) Vitamin B3 500-4,000mg  in divided doses daily (would recommend starting low as can cause uncomfortable facial flushing if started at too high a dose) 2) Phytosterols 2.15 grams daily 3) Fermented soy 30-50 grams daily 4) EGCG (found in green tea): 500-1000mg  daily 5) Omega-3 fatty acids 3000-5,000mg  daily 6) Flax  seed 40 grams daily 7) Monounsaturated fats 20-40 grams daily (olives, olive oil, nuts), also reduces cardiovascular disease 8) Sesame: 40 grams daily 9) Gamma/delta tocotrienols- a family of unsaturated forms of Vitamin E- 200mg  with dinner 10) Pantethine 900mg  daily in divided doses 11) Resveratrol 250mg  daily 12) N Acetyl Cysteine 2000mg  daily in divided doses 13) Curcumin 2000-5000mg  in divided doses daily 14) Pomegranate juice: 8 ounces daily, also helps to lower blood pressure 15) Pomegranate seeds one cup daily, also helps to lower blood pressure 16) Citrus Bergamot 1000mg  daily, also helps with glucose control and weight loss 17) Vitamin C 500mg  daily 18) Quercetin 500-1000mg  daily 19) Glutathione 20) Probiotics 60-100  billion organisms per day 21) Fiber 22) Oats 23) Aged garlic (can eat as food or supplement of 600-900mg  per day) 24) Chia seeds 25 grams per day 25) Lycopene- carotenoid found in high concentrations in tomatoes. 26) Alpha linolenic acid 27) Flavonoids and anthocyanins 28) Wogonin- flavanoid that enhances reverse cholesterol transport 29) Coenzyme Q10 30) Pantethine- derivative of Vitamin B5: 300mg  three times per day or 450mg  twice per day with or without food 31) Barley and other whole grains 32) Orange juice 33) L- carnitine 34) L- Lysine 35) L- Arginine 36) Almonds 37) Morin 38) Rutin 39) Carnosine 40) Histidine  41) Kaempferol  42) Organosulfur compounds 43) Vitamin E 44) Oleic acid 45) RBO (ferulic acid gammaoryzanol) 46) grape seed extract 47) Red wine 48) Berberine HCL 500mg  daily or twice per day- more effective and with fewer adverse effects that ezetimibe monotherapy 49) red yeast rice 2400- 4800 mg/day 50) chlorella 51) Licorice

## 2024-08-20 NOTE — Progress Notes (Signed)
 "  Subjective:    Patient ID: Kelsey Santana, female    DOB: 1961-05-07, 63 y.o.   MRN: [id number]  HPI: Kelsey Santana is a 63 y.o. female who returns for follow up appointment for chronic pain and medication refill. She states her pain is located in her lower back, bilateral hips and right knee pain. She rates her pain 3. Her current exercise regime is walking and performing stretching exercises.  Arrived hypertensive, blood pressure was re-checked. She reports she is compliant with her anti-hypertensive medication.   Ms. Alexie Morphine  equivalent is 20.00 MME.   Last Oral Swab was Performed on 07/08/2024, it was in- consistent. Use Hydrocdone sparingly.      Pain Inventory Average Pain 10 Pain Right Now 3 My pain is sharp, burning, stabbing, tingling, and aching  In the last 24 hours, has pain interfered with the following? General activity 5 Relation with others 5 Enjoyment of life 5 What TIME of day is your pain at its worst? morning , daytime, evening, and night Sleep (in general) Poor  Pain is worse with: walking, bending, sitting, inactivity, and standing Pain improves with: rest, medication, and injections Relief from Meds: 2  Family History  Problem Relation Age of Onset   Alcohol abuse Mother    Diabetes Mother    Hyperlipidemia Mother    Hypertension Mother    COPD Mother    Diabetes Brother    Hyperlipidemia Brother    Hypertension Brother    Depression Brother    Stroke Brother    Thyroid  disease Neg Hx    Social History   Socioeconomic History   Marital status: Single    Spouse name: Not on file   Number of children: Not on file   Years of education: Not on file   Highest education level: Not on file  Occupational History   Not on file  Tobacco Use   Smoking status: Former   Smokeless tobacco: Former   Tobacco comments:    quit 28 yrs ago  Psychologist, Educational Use   Vaping status: Never Used  Substance and Sexual Activity   Alcohol use: No   Drug  use: No   Sexual activity: Never  Other Topics Concern   Not on file  Social History Narrative   epworth sleepiness scale = 10 (11/15/15)    Lives alone in a 2 story home.  Has 3 children.  Currently not working.  Has lost 6 jobs in the past 2 months due to her leg numbness and pain.     Education: 2 years of college.   Social Drivers of Health   Tobacco Use: Medium Risk (07/22/2024)   Received from [facility]   Patient History    Smoking Tobacco Use: Former    Smokeless Tobacco Use: Never    Passive Exposure: Not on file  Financial Resource Strain: Medium Risk (07/02/2024)   Received from [facility]   Overall Financial Resource Strain (CARDIA)    How hard is it for you to pay for the very basics like food, housing, medical care, and heating?: Somewhat hard  Food Insecurity: Food Insecurity Present (07/02/2024)   Received from [facility]   Epic    Within the past 12 months, you worried that your food would run out before you got the money to buy more.: Sometimes true    Within the past 12 months, the food you bought just didn't last and you didn't have money to get more.: Sometimes true  Transportation Needs: No Transportation Needs (07/02/2024)   Received [facility]    In the past 12 months, has lack of transportation kept you from medical appointments or from getting medications?: No    In the past 12 months, has lack of transportation kept you from meetings, work, or from getting things needed for daily living?: No  Physical Activity: Inactive (07/02/2024)   Received from [facility]   Exercise Vital Sign    On average, how many days per week do you engage in moderate to strenuous exercise (like a brisk walk)?: 0 days    Minutes of Exercise per Session: Not on file  Stress: No Stress Concern Present (07/02/2024)   Received from [facility] - Occupational Stress Questionnaire    Do you feel stress - tense,  restless, nervous, or anxious, or unable to sleep at night because your mind is troubled all the time - these days?: Only a little  Social Connections: Socially Isolated (07/02/2024)   Received from [facility]   Social Network    How would you rate your social network (family, work, friends)?: Little participation, lonely and socially isolated  Depression (PHQ2-9): Low Risk (03/10/2024)   Depression (PHQ2-9)    PHQ-2 Score: 0  Alcohol Screen: Low Risk (11/15/2023)   Alcohol Screen    Last Alcohol Screening Score (AUDIT): 0  Housing: Low Risk (07/02/2024)   Received from [facility]    In the last 12 months, was there a time when you were not able to pay the mortgage or rent on time?: No    In the past 12 months, how many times have you moved where you were living?: 0    At any time in the past 12 months, were you homeless or living in a shelter (including now)?: No  Utilities: Not At Risk (07/02/2024)   Received from [facility]    In the past 12 months has the electric, gas, oil, or water company threatened to shut off services in your home?: No  Health Literacy: Adequate Health Literacy (11/15/2023)   B1300 Health Literacy    Frequency of need for help with medical instructions: Never   Past Surgical History:  Procedure Laterality Date   ABDOMINAL HYSTERECTOMY     complete   CHOLECYSTECTOMY N/A 10/03/2023   Procedure: LAPAROSCOPIC CHOLECYSTECTOMY;  Surgeon: Belinda Cough, MD;  Location: [facility] OR;  Service: General;  Laterality: N/A;  Assit: RNFA   COLONOSCOPY N/A 01/16/2017   Procedure: COLONOSCOPY;  Surgeon: Rollin Dover, MD;  Location: [facility];  Service: Endoscopy;  Laterality: N/A;   ESOPHAGOGASTRODUODENOSCOPY N/A 01/16/2017   Procedure: ESOPHAGOGASTRODUODENOSCOPY (EGD);  Surgeon: Rollin Dover, MD;  Location: [facility];  Service: Endoscopy;  Laterality: N/A;   INTRAOPERATIVE CHOLANGIOGRAM N/A 10/03/2023   Procedure: INTRAOPERATIVE CHOLANGIOGRAM;   Surgeon: Belinda Cough, MD;  Location: [facility] OR;  Service: General;  Laterality: N/A;   Past Surgical History:  Procedure Laterality Date   ABDOMINAL HYSTERECTOMY     complete   CHOLECYSTECTOMY N/A 10/03/2023   Procedure: LAPAROSCOPIC CHOLECYSTECTOMY;  Surgeon: Belinda Cough, MD;  Location: [facility] OR;  Service: General;  Laterality: N/A;  Assit: RNFA   COLONOSCOPY N/A 01/16/2017   Procedure: COLONOSCOPY;  Surgeon: Rollin Dover, MD;  Location: [facility];  Service: Endoscopy;  Laterality: N/A;   ESOPHAGOGASTRODUODENOSCOPY N/A 01/16/2017   Procedure: ESOPHAGOGASTRODUODENOSCOPY (EGD);  Surgeon: Rollin Dover, MD;  Location: [facility];  Service:  Endoscopy;  Laterality: N/A;   INTRAOPERATIVE CHOLANGIOGRAM N/A 10/03/2023   Procedure: INTRAOPERATIVE CHOLANGIOGRAM;  Surgeon: Belinda Cough, MD;  Location: [facility] OR;  Service: General;  Laterality: N/A;   Past Medical History:  Diagnosis Date   Anxiety    Arthritis    Back pain    l 4 and l5 djd   Depression    Fatty liver    GERD (gastroesophageal reflux disease)    Hypercholesteremia    Hypertension    Migraine    Pre-diabetes    There were no vitals taken for this visit.  Opioid Risk Score:   Fall Risk Score:  `1  Depression screen [facility] 2/9     03/10/2024   10:42 AM 01/17/2024    1:43 PM 11/27/2023   11:25 AM 11/15/2023   11:20 AM 08/27/2023    1:04 PM 08/09/2023    8:41 AM 06/14/2023    9:51 AM  Depression screen PHQ 2/9  Decreased Interest 0 0 0 0 0 0 1  Down, Depressed, Hopeless  0 0 0 0 0 1  PHQ - 2 Score 0 0 0 0 0 0 2  Altered sleeping    3     Tired, decreased energy    3     Change in appetite    1     Feeling bad or failure about yourself     0     Trouble concentrating    0     Moving slowly or fidgety/restless    0     Suicidal thoughts    0     PHQ-9 Score    7      Difficult doing work/chores    Not difficult at all        Data saved with a previous flowsheet row definition    Review of Systems  Constitutional:  Negative.   HENT: Negative.    Eyes: Negative.   Respiratory: Negative.    Gastrointestinal: Negative.   Endocrine: Negative.   Genitourinary: Negative.   Musculoskeletal:  Positive for back pain and myalgias.  Skin: Negative.   Allergic/Immunologic: Negative.   Neurological: Negative.   Hematological: Negative.   Psychiatric/Behavioral: Negative.         Objective:   Physical Exam Vitals and nursing note reviewed.  Constitutional:      Appearance: Normal appearance. She is obese.  Cardiovascular:     Rate and Rhythm: Normal rate and regular rhythm.     Pulses: Normal pulses.     Heart sounds: Normal heart sounds.  Pulmonary:     Effort: Pulmonary effort is normal.     Breath sounds: Normal breath sounds.  Musculoskeletal:     Comments: Normal Muscle Bulk and Muscle Testing Reveals:  Upper Extremities: Full ROM and Muscle Strength 5/5 Lumbar Paraspinal Tenderness: L-4-L-5 Bilateral Greater Trochanter Tenderness  Lower Extremities: Full ROM and Muscle Strength 5/5 Arises from Table with ease Narrow Based  Gait     Skin:    General: Skin is warm and dry.  Neurological:     Mental Status: She is alert and oriented to person, place, and time.  Psychiatric:        Mood and Affect: Mood normal.        Behavior: Behavior normal.        Assessment and Plan :   Chronic Low Back Pain without Sciatica/ Right Lumbar Radiculitis: Continue Gabapentin  . Schedule for [place]  Patches .Continue HEP as Tolerated. Continue to monitor.  08/22/2024 Bilateral Greater Trochanter Tenderness: Continue to Alternate Ice and Heat Therapy. Continue to Monitor. 08/22/2024 Bilateral Primary Osteoarthritis of Bilateral Knees:  Continue  HEP as Tolerated . Continue current medication regimen as prescribed. Continue to Monitor. 08/22/2024 Chronic Pain Syndrome: Continue   Hydrocodone  10mg /325 one tablet twice a day as needed for pain. #60, no script given, she is using it sparingly.   We will  continue the opioid monitoring program, this consists of regular clinic visits, examinations, urine drug screen, pill counts as well as use of [place]  Controlled Substance Reporting system. A 12 month History has been reviewed on the [place]  Controlled Substance Reporting System on 08/22/2024 5.Morbid Obesity: Continue Healthy Diet Regimen and Continue with HEP as Tolerated. SABRAPCP Following.  08/22/2024 6. Migraine without Aura: No complaints today. Continue current medication regimen. Continue to Monitor. 08/22/2024.     F/U with Dr Lorilee  "

## 2024-08-22 ENCOUNTER — Encounter: Attending: Physical Medicine and Rehabilitation | Admitting: Registered Nurse

## 2024-08-22 ENCOUNTER — Encounter: Payer: Self-pay | Admitting: Registered Nurse

## 2024-08-22 VITALS — BP 157/93 | HR 68 | Ht 64.0 in | Wt 258.2 lb

## 2024-08-22 DIAGNOSIS — M1711 Unilateral primary osteoarthritis, right knee: Secondary | ICD-10-CM | POA: Insufficient documentation

## 2024-08-22 DIAGNOSIS — I10 Essential (primary) hypertension: Secondary | ICD-10-CM | POA: Diagnosis not present

## 2024-08-22 DIAGNOSIS — G8929 Other chronic pain: Secondary | ICD-10-CM | POA: Insufficient documentation

## 2024-08-22 DIAGNOSIS — G894 Chronic pain syndrome: Secondary | ICD-10-CM | POA: Diagnosis not present

## 2024-08-22 DIAGNOSIS — M7062 Trochanteric bursitis, left hip: Secondary | ICD-10-CM | POA: Diagnosis not present

## 2024-08-22 DIAGNOSIS — Z5181 Encounter for therapeutic drug level monitoring: Secondary | ICD-10-CM | POA: Diagnosis not present

## 2024-08-22 DIAGNOSIS — M545 Low back pain, unspecified: Secondary | ICD-10-CM | POA: Diagnosis not present

## 2024-08-22 DIAGNOSIS — M7061 Trochanteric bursitis, right hip: Secondary | ICD-10-CM | POA: Diagnosis not present

## 2024-08-22 DIAGNOSIS — Z79891 Long term (current) use of opiate analgesic: Secondary | ICD-10-CM | POA: Insufficient documentation

## 2024-09-11 ENCOUNTER — Encounter: Admitting: Physical Medicine and Rehabilitation

## 2024-10-28 ENCOUNTER — Encounter: Admitting: Physical Medicine and Rehabilitation

## 2024-11-17 ENCOUNTER — Encounter
